# Patient Record
Sex: Female | Born: 1980 | Race: Black or African American | Hispanic: No | Smoking: Light tobacco smoker
Health system: Southern US, Community
[De-identification: ages and names within clinical notes are randomized; demographics above are authoritative.]

## PROBLEM LIST (undated history)

## (undated) DIAGNOSIS — M545 Low back pain, unspecified: Secondary | ICD-10-CM

## (undated) DIAGNOSIS — D649 Anemia, unspecified: Secondary | ICD-10-CM

## (undated) DIAGNOSIS — F329 Major depressive disorder, single episode, unspecified: Secondary | ICD-10-CM

## (undated) DIAGNOSIS — R002 Palpitations: Secondary | ICD-10-CM

## (undated) DIAGNOSIS — J40 Bronchitis, not specified as acute or chronic: Secondary | ICD-10-CM

## (undated) DIAGNOSIS — K859 Acute pancreatitis without necrosis or infection, unspecified: Secondary | ICD-10-CM

## (undated) DIAGNOSIS — G4700 Insomnia, unspecified: Secondary | ICD-10-CM

## (undated) DIAGNOSIS — J302 Other seasonal allergic rhinitis: Secondary | ICD-10-CM

## (undated) DIAGNOSIS — R011 Cardiac murmur, unspecified: Secondary | ICD-10-CM

## (undated) DIAGNOSIS — D573 Sickle-cell trait: Secondary | ICD-10-CM

## (undated) DIAGNOSIS — M1990 Unspecified osteoarthritis, unspecified site: Secondary | ICD-10-CM

## (undated) DIAGNOSIS — N951 Menopausal and female climacteric states: Secondary | ICD-10-CM

## (undated) DIAGNOSIS — N730 Acute parametritis and pelvic cellulitis: Secondary | ICD-10-CM

## (undated) DIAGNOSIS — F4310 Post-traumatic stress disorder, unspecified: Secondary | ICD-10-CM

## (undated) DIAGNOSIS — G8929 Other chronic pain: Secondary | ICD-10-CM

## (undated) DIAGNOSIS — J45909 Unspecified asthma, uncomplicated: Secondary | ICD-10-CM

## (undated) DIAGNOSIS — I10 Essential (primary) hypertension: Secondary | ICD-10-CM

## (undated) DIAGNOSIS — F32A Depression, unspecified: Secondary | ICD-10-CM

## (undated) DIAGNOSIS — G43909 Migraine, unspecified, not intractable, without status migrainosus: Secondary | ICD-10-CM

## (undated) DIAGNOSIS — O139 Gestational [pregnancy-induced] hypertension without significant proteinuria, unspecified trimester: Secondary | ICD-10-CM

## (undated) DIAGNOSIS — Z973 Presence of spectacles and contact lenses: Secondary | ICD-10-CM

## (undated) DIAGNOSIS — K219 Gastro-esophageal reflux disease without esophagitis: Secondary | ICD-10-CM

## (undated) DIAGNOSIS — M5412 Radiculopathy, cervical region: Secondary | ICD-10-CM

## (undated) HISTORY — DX: Essential (primary) hypertension: I10

## (undated) HISTORY — PX: TUBAL LIGATION: SHX77

## (undated) HISTORY — DX: Insomnia, unspecified: G47.00

## (undated) HISTORY — DX: Bronchitis, not specified as acute or chronic: J40

## (undated) HISTORY — DX: Anemia, unspecified: D64.9

## (undated) HISTORY — PX: DILATION AND CURETTAGE OF UTERUS: SHX78

## (undated) HISTORY — PX: OVARY SURGERY: SHX727

---
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
## 2001-01-22 ENCOUNTER — Encounter: Admission: RE | Admit: 2001-01-22 | Discharge: 2001-01-22 | Payer: Self-pay | Admitting: Family Medicine

## 2001-01-23 ENCOUNTER — Emergency Department (HOSPITAL_COMMUNITY): Admission: EM | Admit: 2001-01-23 | Discharge: 2001-01-24 | Payer: Self-pay | Admitting: Emergency Medicine

## 2001-02-05 ENCOUNTER — Inpatient Hospital Stay (HOSPITAL_COMMUNITY): Admission: AD | Admit: 2001-02-05 | Discharge: 2001-02-05 | Payer: Self-pay | Admitting: Obstetrics & Gynecology

## 2001-10-09 ENCOUNTER — Emergency Department (HOSPITAL_COMMUNITY): Admission: EM | Admit: 2001-10-09 | Discharge: 2001-10-10 | Payer: Self-pay | Admitting: Emergency Medicine

## 2001-11-03 ENCOUNTER — Inpatient Hospital Stay: Admission: AD | Admit: 2001-11-03 | Discharge: 2001-11-03 | Payer: Self-pay | Admitting: Obstetrics and Gynecology

## 2001-11-19 ENCOUNTER — Inpatient Hospital Stay (HOSPITAL_COMMUNITY): Admission: AD | Admit: 2001-11-19 | Discharge: 2001-11-19 | Payer: Self-pay | Admitting: Obstetrics and Gynecology

## 2001-12-05 ENCOUNTER — Encounter: Payer: Self-pay | Admitting: Obstetrics and Gynecology

## 2001-12-05 ENCOUNTER — Inpatient Hospital Stay (HOSPITAL_COMMUNITY): Admission: AD | Admit: 2001-12-05 | Discharge: 2001-12-13 | Payer: Self-pay | Admitting: *Deleted

## 2001-12-20 ENCOUNTER — Encounter: Admission: RE | Admit: 2001-12-20 | Discharge: 2001-12-20 | Payer: Self-pay | Admitting: *Deleted

## 2001-12-26 ENCOUNTER — Inpatient Hospital Stay (HOSPITAL_COMMUNITY): Admission: AD | Admit: 2001-12-26 | Discharge: 2001-12-26 | Payer: Self-pay | Admitting: Obstetrics and Gynecology

## 2002-01-03 ENCOUNTER — Encounter: Admission: RE | Admit: 2002-01-03 | Discharge: 2002-01-03 | Payer: Self-pay | Admitting: Obstetrics and Gynecology

## 2002-01-14 ENCOUNTER — Inpatient Hospital Stay (HOSPITAL_COMMUNITY): Admission: AD | Admit: 2002-01-14 | Discharge: 2002-01-14 | Payer: Self-pay | Admitting: *Deleted

## 2002-01-23 ENCOUNTER — Encounter: Admission: RE | Admit: 2002-01-23 | Discharge: 2002-01-23 | Payer: Self-pay | Admitting: *Deleted

## 2002-01-28 ENCOUNTER — Inpatient Hospital Stay (HOSPITAL_COMMUNITY): Admission: AD | Admit: 2002-01-28 | Discharge: 2002-01-30 | Payer: Self-pay | Admitting: Obstetrics and Gynecology

## 2002-03-29 ENCOUNTER — Encounter: Admission: RE | Admit: 2002-03-29 | Discharge: 2002-03-29 | Payer: Self-pay | Admitting: Family Medicine

## 2002-04-29 ENCOUNTER — Encounter: Admission: RE | Admit: 2002-04-29 | Discharge: 2002-04-29 | Payer: Self-pay | Admitting: Family Medicine

## 2002-05-07 ENCOUNTER — Encounter: Admission: RE | Admit: 2002-05-07 | Discharge: 2002-05-07 | Payer: Self-pay | Admitting: Family Medicine

## 2002-06-03 ENCOUNTER — Ambulatory Visit (HOSPITAL_COMMUNITY): Admission: RE | Admit: 2002-06-03 | Discharge: 2002-06-03 | Payer: Self-pay | Admitting: Neurology

## 2002-06-14 ENCOUNTER — Other Ambulatory Visit: Admission: RE | Admit: 2002-06-14 | Discharge: 2002-06-14 | Payer: Self-pay | Admitting: Internal Medicine

## 2002-06-14 ENCOUNTER — Encounter: Admission: RE | Admit: 2002-06-14 | Discharge: 2002-06-14 | Payer: Self-pay | Admitting: Family Medicine

## 2002-06-14 ENCOUNTER — Encounter (INDEPENDENT_AMBULATORY_CARE_PROVIDER_SITE_OTHER): Payer: Self-pay | Admitting: Specialist

## 2002-06-18 ENCOUNTER — Encounter: Payer: Self-pay | Admitting: Sports Medicine

## 2002-06-18 ENCOUNTER — Encounter: Admission: RE | Admit: 2002-06-18 | Discharge: 2002-06-18 | Payer: Self-pay | Admitting: Sports Medicine

## 2002-06-20 ENCOUNTER — Encounter (INDEPENDENT_AMBULATORY_CARE_PROVIDER_SITE_OTHER): Payer: Self-pay | Admitting: *Deleted

## 2002-06-20 HISTORY — PX: OVARY SURGERY: SHX727

## 2002-06-21 ENCOUNTER — Encounter: Admission: RE | Admit: 2002-06-21 | Discharge: 2002-06-21 | Payer: Self-pay | Admitting: Family Medicine

## 2002-07-03 ENCOUNTER — Encounter: Payer: Self-pay | Admitting: Internal Medicine

## 2002-07-03 ENCOUNTER — Encounter: Admission: RE | Admit: 2002-07-03 | Discharge: 2002-07-03 | Payer: Self-pay | Admitting: Internal Medicine

## 2002-07-05 ENCOUNTER — Emergency Department (HOSPITAL_COMMUNITY): Admission: EM | Admit: 2002-07-05 | Discharge: 2002-07-05 | Payer: Self-pay | Admitting: Emergency Medicine

## 2002-07-11 ENCOUNTER — Encounter: Admission: RE | Admit: 2002-07-11 | Discharge: 2002-07-11 | Payer: Self-pay | Admitting: Family Medicine

## 2002-07-18 ENCOUNTER — Encounter: Admission: RE | Admit: 2002-07-18 | Discharge: 2002-07-18 | Payer: Self-pay | Admitting: Family Medicine

## 2002-07-19 ENCOUNTER — Encounter: Admission: RE | Admit: 2002-07-19 | Discharge: 2002-07-19 | Payer: Self-pay | Admitting: *Deleted

## 2002-07-19 ENCOUNTER — Other Ambulatory Visit: Admission: RE | Admit: 2002-07-19 | Discharge: 2002-07-19 | Payer: Self-pay | Admitting: *Deleted

## 2002-07-19 ENCOUNTER — Encounter (INDEPENDENT_AMBULATORY_CARE_PROVIDER_SITE_OTHER): Payer: Self-pay

## 2002-08-02 ENCOUNTER — Encounter: Admission: RE | Admit: 2002-08-02 | Discharge: 2002-08-02 | Payer: Self-pay | Admitting: *Deleted

## 2002-08-12 ENCOUNTER — Inpatient Hospital Stay (HOSPITAL_COMMUNITY): Admission: AD | Admit: 2002-08-12 | Discharge: 2002-08-12 | Payer: Self-pay | Admitting: Obstetrics and Gynecology

## 2002-08-13 ENCOUNTER — Inpatient Hospital Stay (HOSPITAL_COMMUNITY): Admission: AD | Admit: 2002-08-13 | Discharge: 2002-08-13 | Payer: Self-pay | Admitting: *Deleted

## 2002-08-13 ENCOUNTER — Encounter: Payer: Self-pay | Admitting: *Deleted

## 2002-08-14 ENCOUNTER — Inpatient Hospital Stay (HOSPITAL_COMMUNITY): Admission: RE | Admit: 2002-08-14 | Discharge: 2002-08-16 | Payer: Self-pay | Admitting: Obstetrics and Gynecology

## 2002-08-14 ENCOUNTER — Encounter (INDEPENDENT_AMBULATORY_CARE_PROVIDER_SITE_OTHER): Payer: Self-pay

## 2002-08-17 ENCOUNTER — Inpatient Hospital Stay (HOSPITAL_COMMUNITY): Admission: AD | Admit: 2002-08-17 | Discharge: 2002-08-17 | Payer: Self-pay | Admitting: *Deleted

## 2002-08-23 ENCOUNTER — Encounter: Admission: RE | Admit: 2002-08-23 | Discharge: 2002-08-23 | Payer: Self-pay | Admitting: *Deleted

## 2003-10-18 ENCOUNTER — Emergency Department (HOSPITAL_COMMUNITY): Admission: EM | Admit: 2003-10-18 | Discharge: 2003-10-18 | Payer: Self-pay | Admitting: Emergency Medicine

## 2004-01-21 ENCOUNTER — Encounter: Admission: RE | Admit: 2004-01-21 | Discharge: 2004-01-21 | Payer: Self-pay | Admitting: Sports Medicine

## 2004-02-18 ENCOUNTER — Other Ambulatory Visit: Admission: RE | Admit: 2004-02-18 | Discharge: 2004-02-18 | Payer: Self-pay | Admitting: Family Medicine

## 2004-02-18 ENCOUNTER — Encounter: Admission: RE | Admit: 2004-02-18 | Discharge: 2004-02-18 | Payer: Self-pay | Admitting: Family Medicine

## 2004-02-26 ENCOUNTER — Ambulatory Visit: Payer: Self-pay | Admitting: Family Medicine

## 2004-03-12 ENCOUNTER — Ambulatory Visit: Payer: Self-pay | Admitting: Family Medicine

## 2004-03-17 ENCOUNTER — Encounter: Admission: RE | Admit: 2004-03-17 | Discharge: 2004-03-17 | Payer: Self-pay | Admitting: Sports Medicine

## 2004-03-17 IMAGING — CR DG HAND COMPLETE 3+V*L*
3 series · 3 of 3 positions shown · non-contrast
Comparison: none

CLINICAL DATA: Bilateral hand pain and swelling, felt to possibly be rheumatoid arthritis.
 RIGHT HAND, COMPLETE
 Three views of the right hand are made without previous films for comparison and show no definite bony or joint abnormality.  Soft tissues show what appears to be  mild soft tissue swelling associated with the proximal phalanx of the right index finger.  
 IMPRESSION
 No definite bony or joint abnormality.  Mild soft tissue swelling, proximal phalanx area, right index finger.
 LEFT HAND, COMPLETE
 Three views of the left hand show no bony or joint abnormality.  Soft tissues appear to be within normal limits.
 Normal left hand.

[view not recorded (1 of 3)]
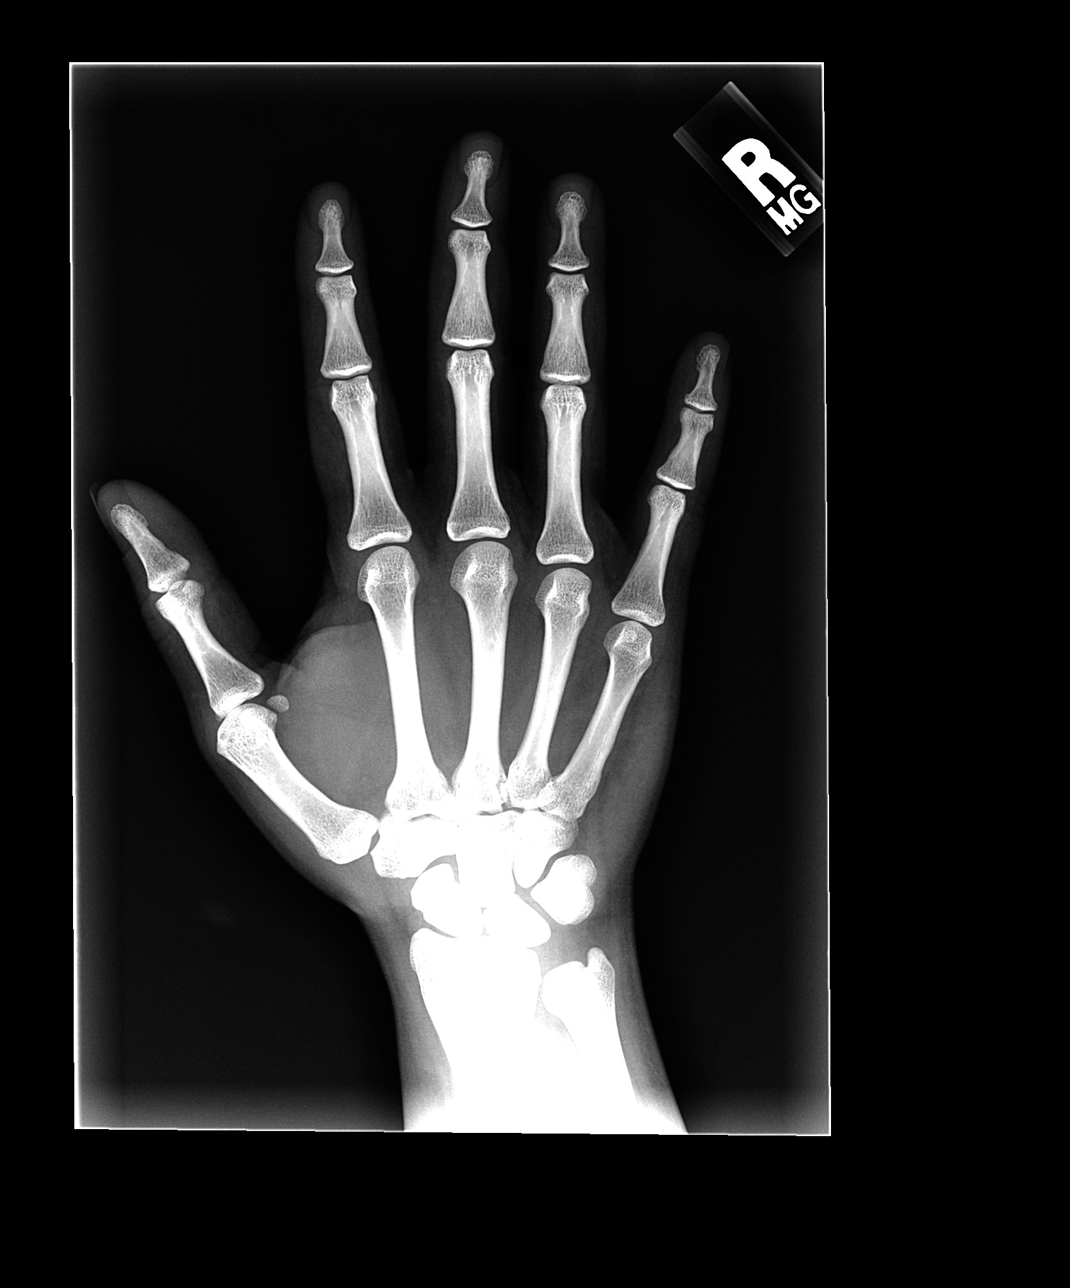

[view not recorded (2 of 3)]
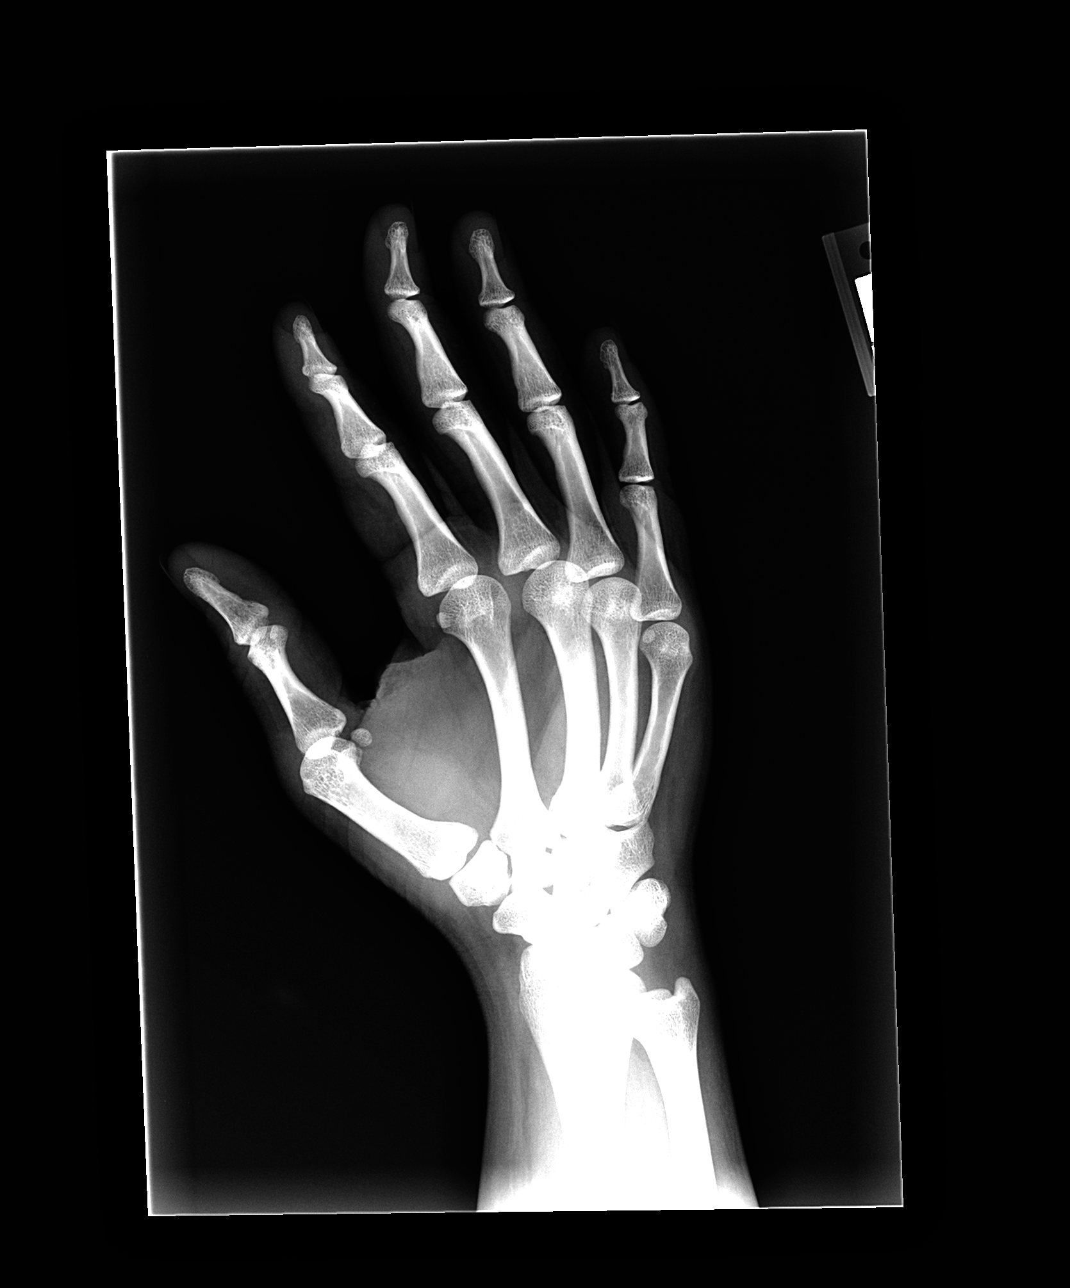

[view not recorded (3 of 3)]
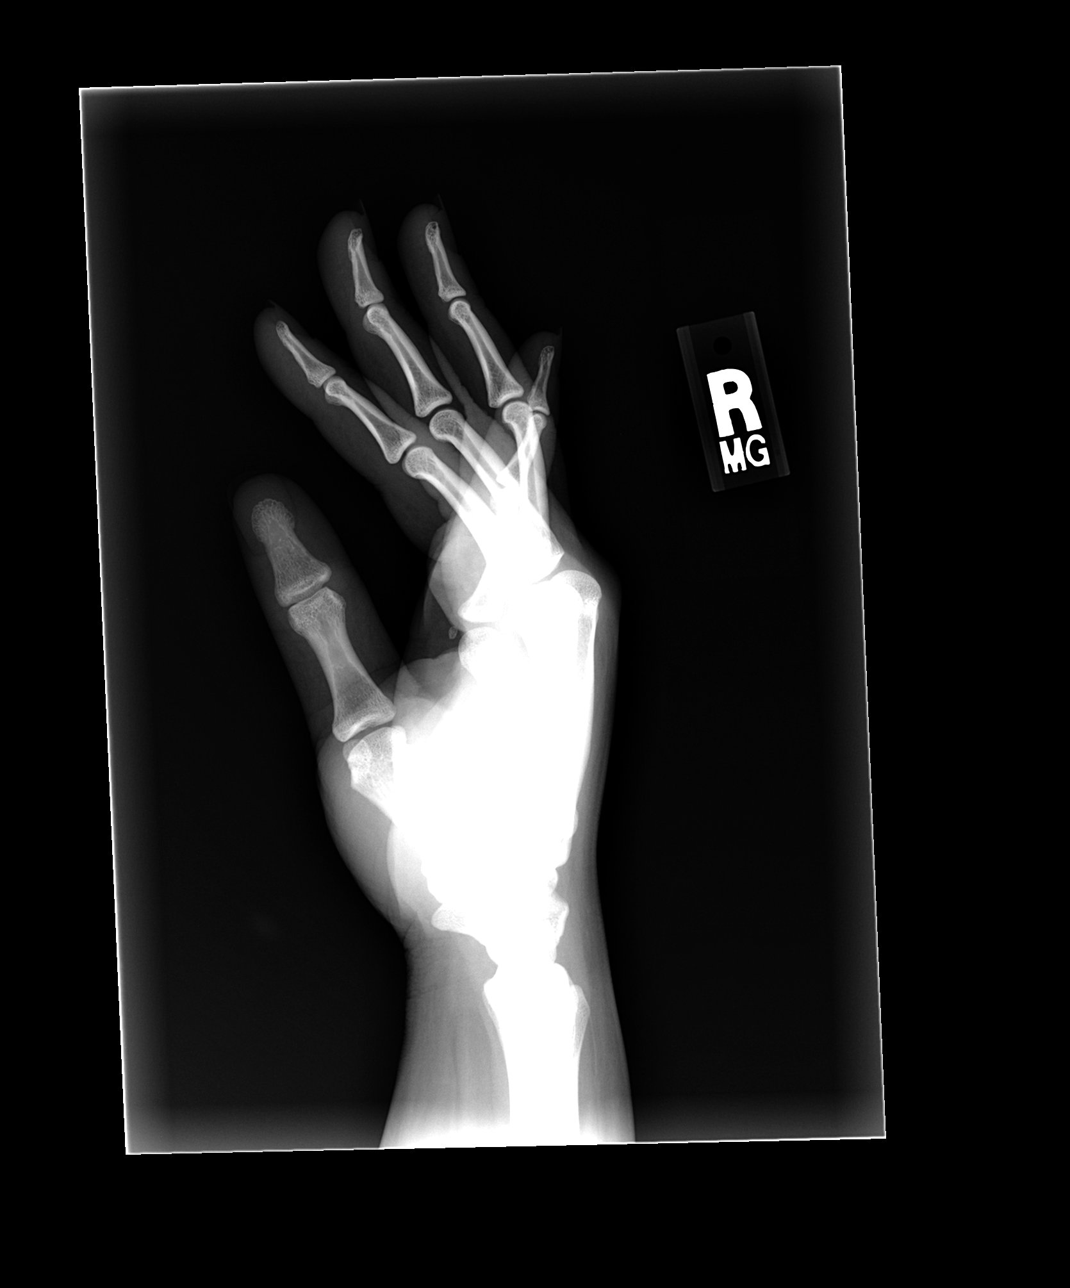

[3 of 3 positions shown; findings below may reference images not displayed]

## 2004-03-17 IMAGING — CR DG HAND COMPLETE 3+V*L*
3 series · 3 of 3 positions shown · non-contrast
Comparison: none

CLINICAL DATA: Bilateral hand pain and swelling, felt to possibly be rheumatoid arthritis.
 RIGHT HAND, COMPLETE
 Three views of the right hand are made without previous films for comparison and show no definite bony or joint abnormality.  Soft tissues show what appears to be  mild soft tissue swelling associated with the proximal phalanx of the right index finger.  
 IMPRESSION
 No definite bony or joint abnormality.  Mild soft tissue swelling, proximal phalanx area, right index finger.
 LEFT HAND, COMPLETE
 Three views of the left hand show no bony or joint abnormality.  Soft tissues appear to be within normal limits.
 Normal left hand.

[view not recorded (1 of 3)]
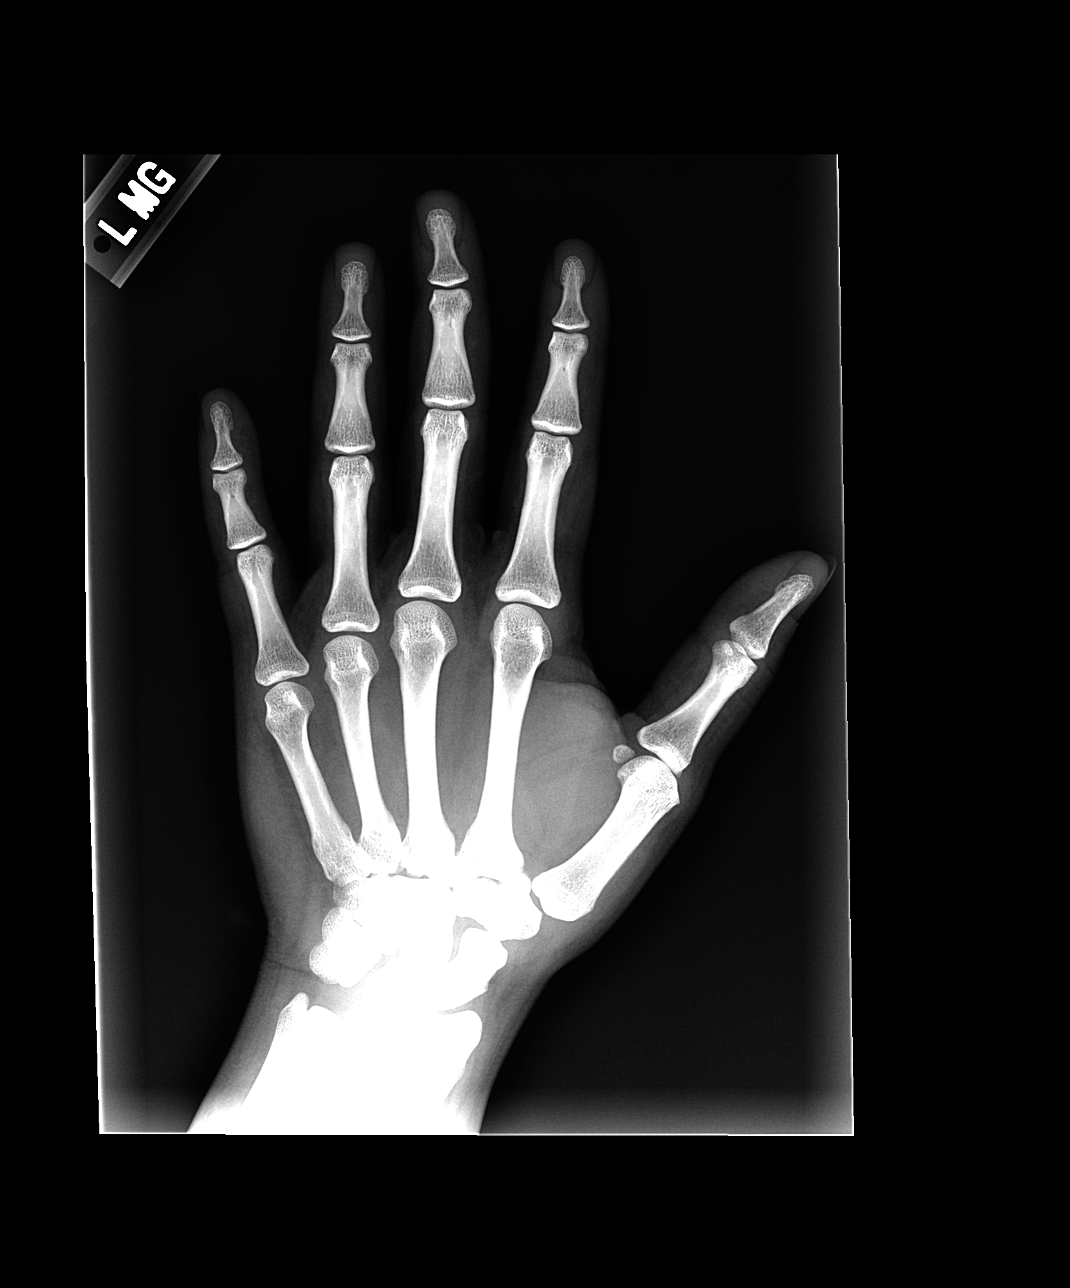

[view not recorded (2 of 3)]
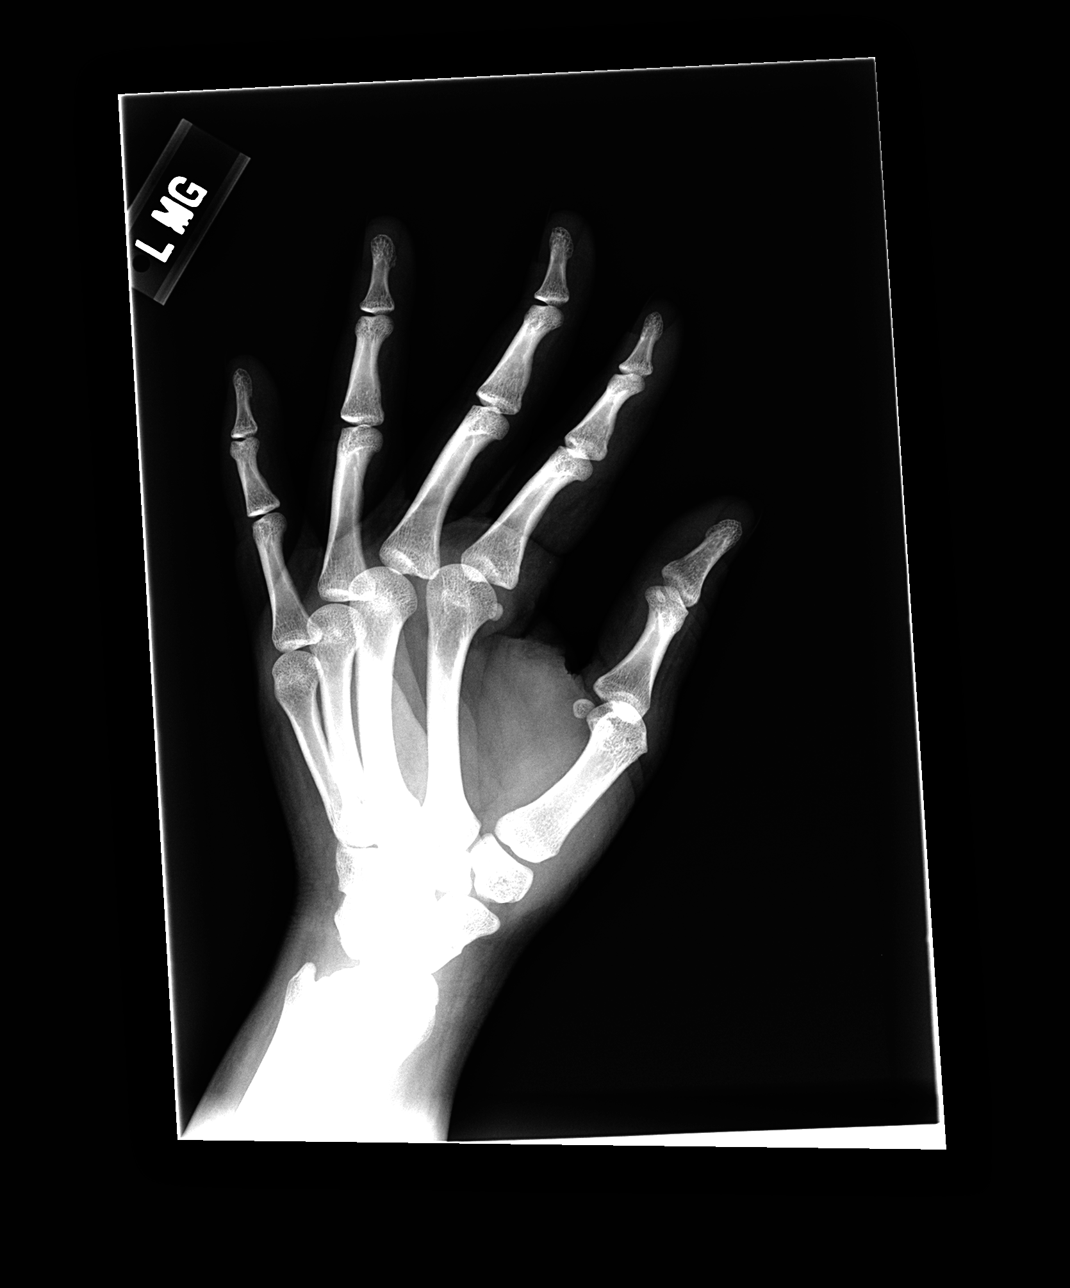

[view not recorded (3 of 3)]
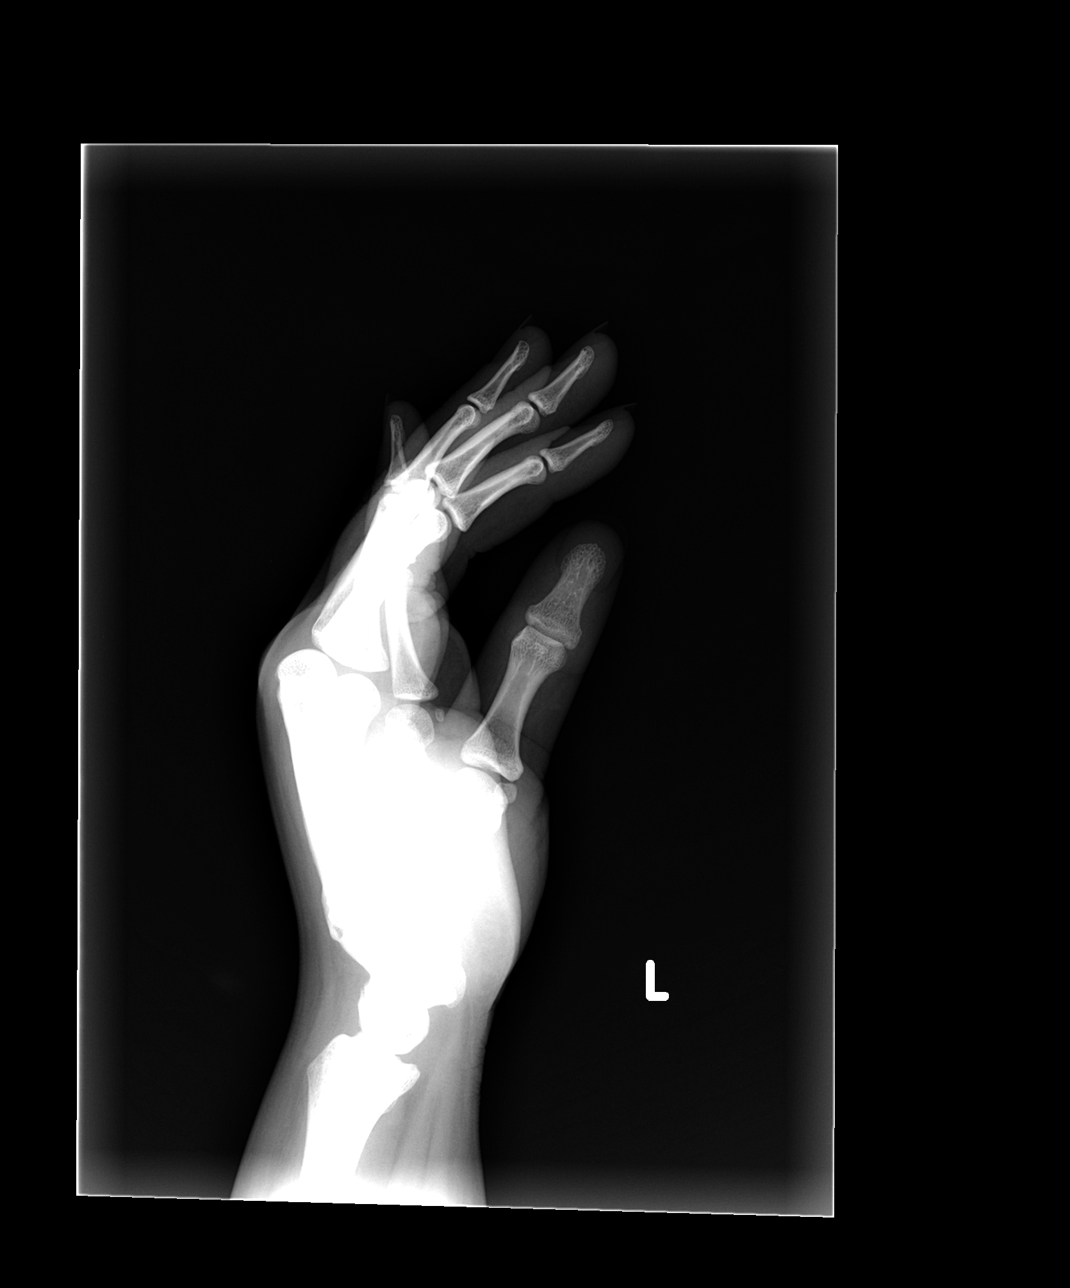

[3 of 3 positions shown; findings below may reference images not displayed]

## 2004-07-19 ENCOUNTER — Emergency Department (HOSPITAL_COMMUNITY): Admission: EM | Admit: 2004-07-19 | Discharge: 2004-07-19 | Payer: Self-pay | Admitting: Emergency Medicine

## 2004-07-23 ENCOUNTER — Ambulatory Visit: Payer: Self-pay | Admitting: Family Medicine

## 2004-07-26 ENCOUNTER — Encounter: Admission: RE | Admit: 2004-07-26 | Discharge: 2004-07-26 | Payer: Self-pay | Admitting: Sports Medicine

## 2004-07-26 IMAGING — US US TRANSVAGINAL NON-OB
1 series · 14 of 25 positions shown · non-contrast
Comparison: none

CLINICAL DATA: Pelvic pain.
 TRANSABDOMINAL AND TRANSVAGINAL PELVIC ULTRASOUND:
 Transabdominal and transvaginal ultrasound of the pelvis were performed.  The uterus is normal in size measuring 9.9 cm sagittally with a depth of 4.9 cm and width of 5.7 cm.   The endometrium is normal measuring 4 mm in thickness.  The ovaries are within the upper limits of normal in size with the right ovary measuring 4.6 x 3.1 x 2.5 cm.  There are multiple cysts in the right ovary, and with some of them being in the periphery, polycystic ovary disease would be difficult to exclude.   The left ovary measures 3.5 x 1.6 x 1.6 cm with small follicles present.  Only a small amount of free fluid is noted.

[Series 1: unknown · 0.26mm/px · 14 of 67 slices shown]
[im 1/67]
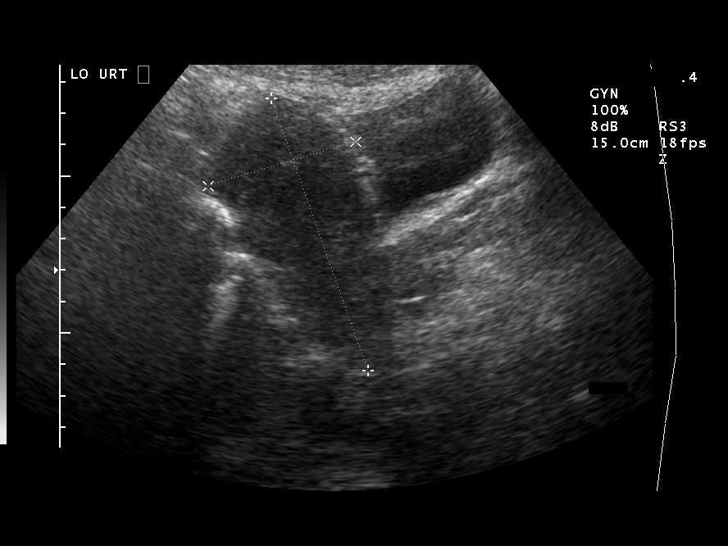
[im 6/67]
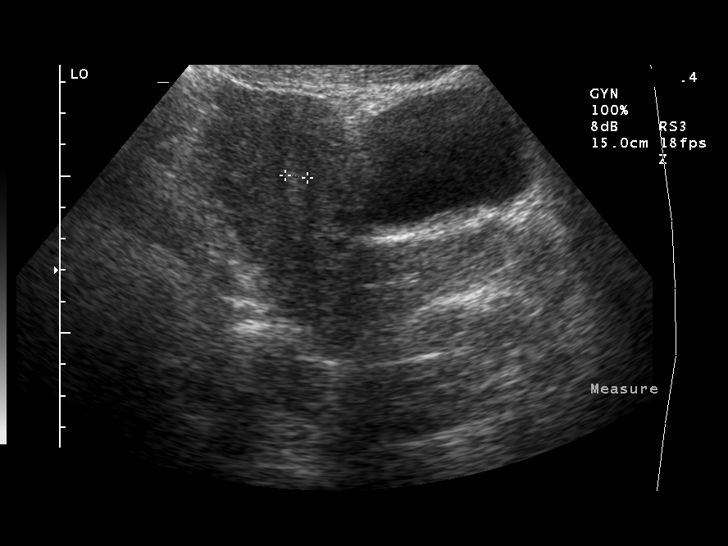
[im 12/67]
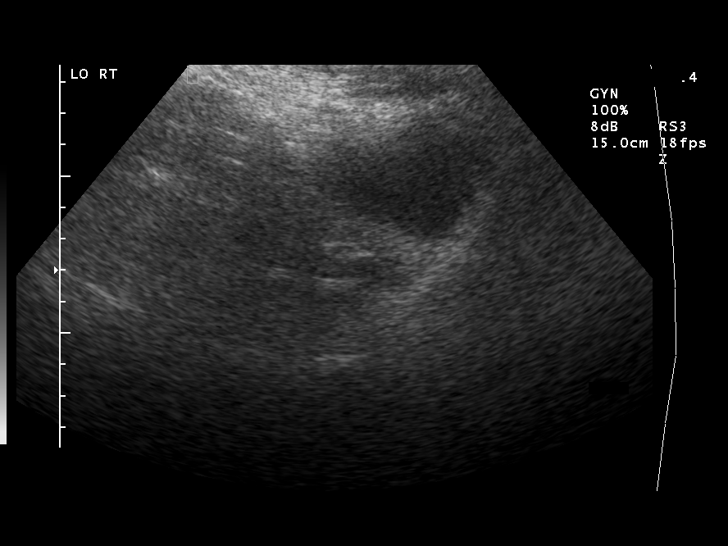
[im 17/67]
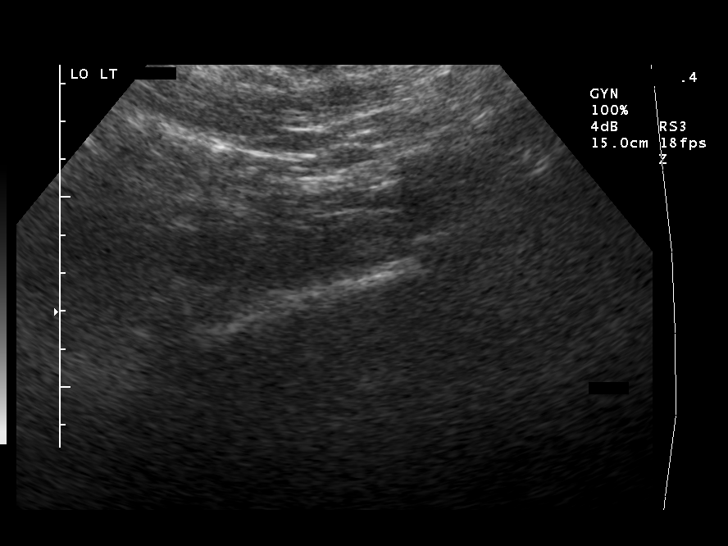
[im 23/67]
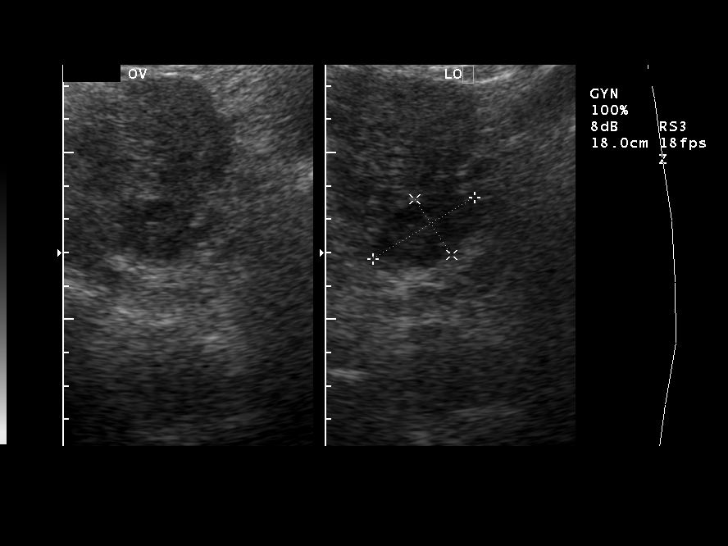
[im 25/67]
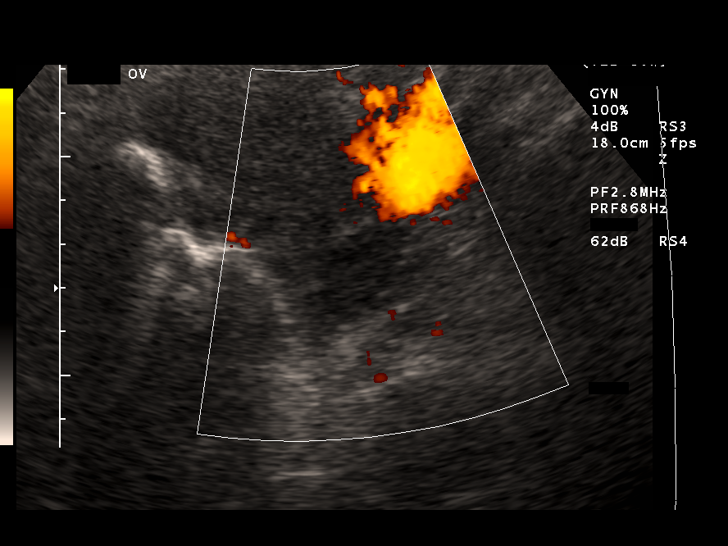
[im 31/67]
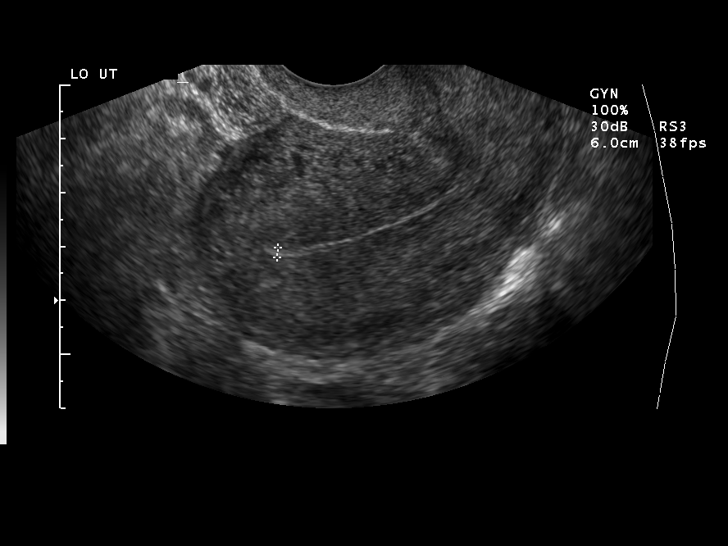
[im 36/67]
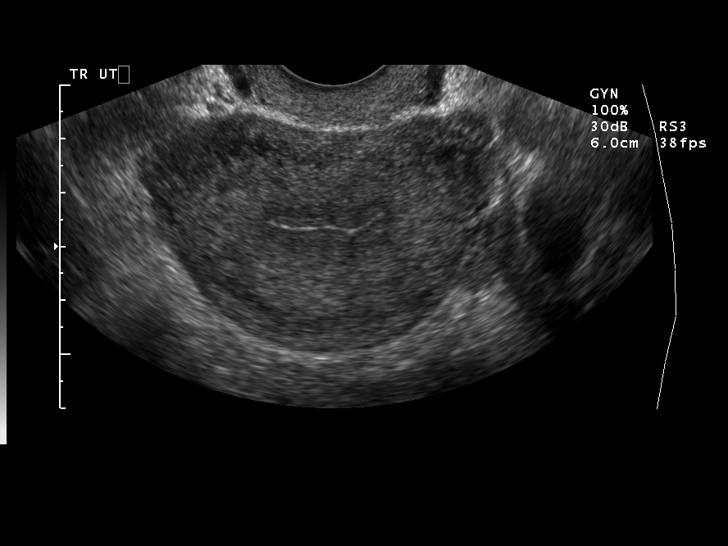
[im 42/67]
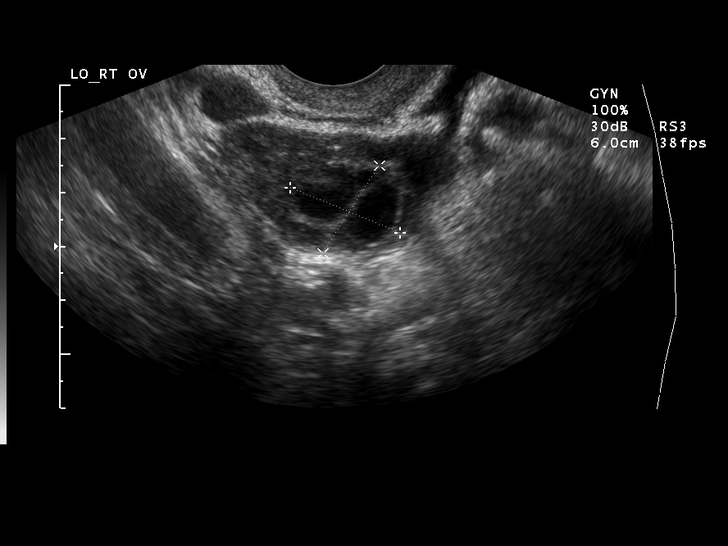
[im 45/67]
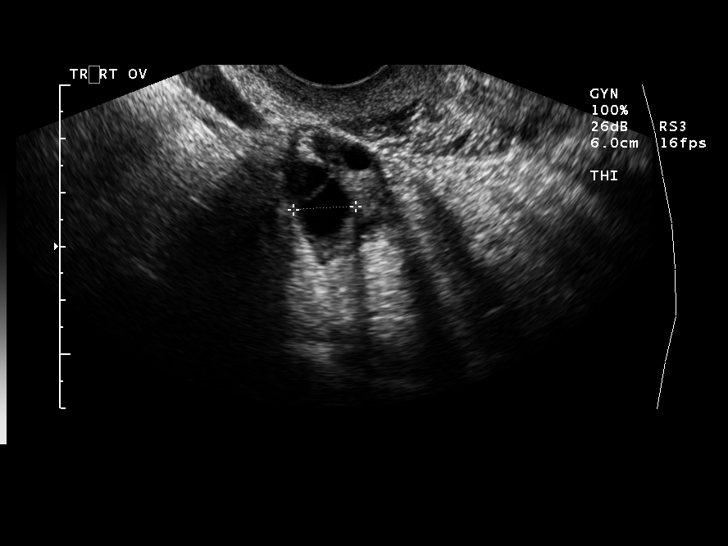
[im 50/67]
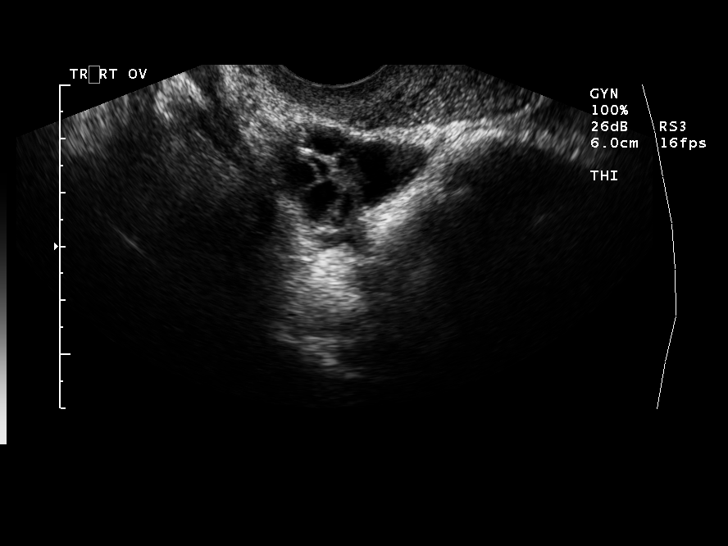
[im 56/67]
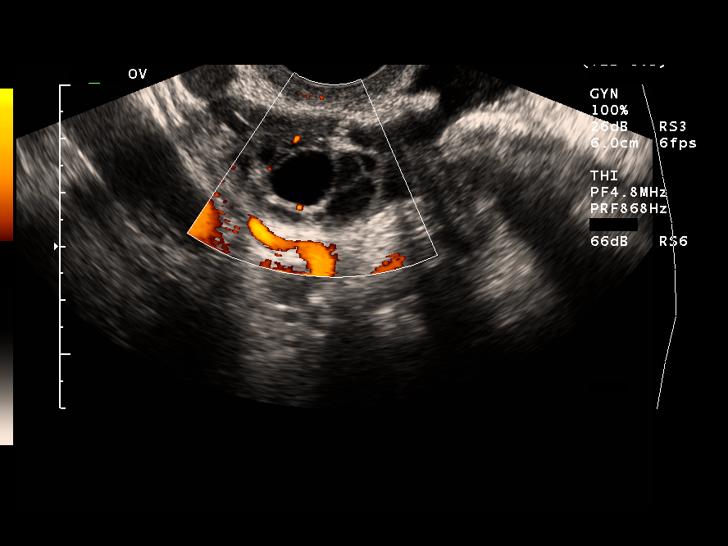
[im 61/67]
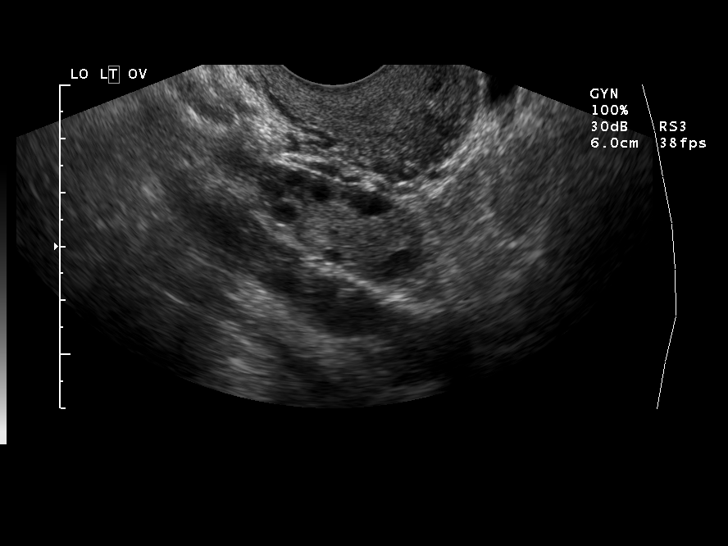
[im 67/67]
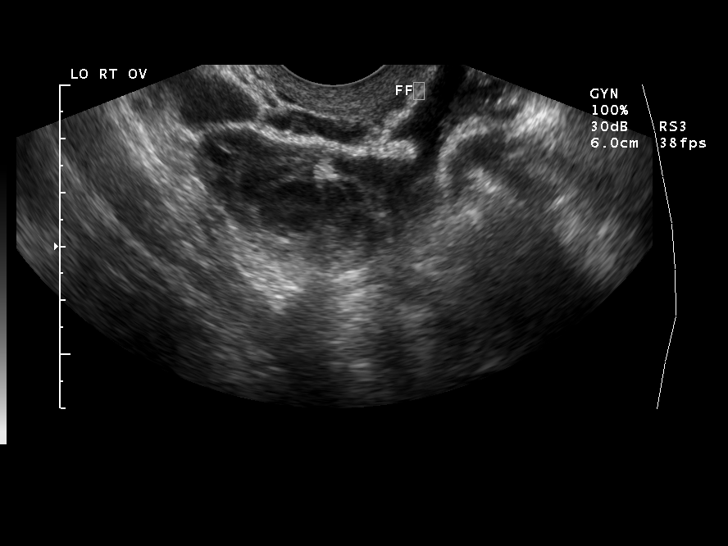

[14 of 25 positions shown; findings below may reference images not displayed]

IMPRESSION: Multiple cysts in the right ovary, some of which are very peripheral.  Cannot exclude polycystic ovary disease.  No abnormality of the uterus is seen.

## 2004-10-03 ENCOUNTER — Emergency Department (HOSPITAL_COMMUNITY): Admission: EM | Admit: 2004-10-03 | Discharge: 2004-10-03 | Payer: Self-pay | Admitting: Emergency Medicine

## 2004-11-01 ENCOUNTER — Emergency Department (HOSPITAL_COMMUNITY): Admission: EM | Admit: 2004-11-01 | Discharge: 2004-11-01 | Payer: Self-pay | Admitting: Emergency Medicine

## 2004-11-16 ENCOUNTER — Emergency Department (HOSPITAL_COMMUNITY): Admission: EM | Admit: 2004-11-16 | Discharge: 2004-11-16 | Payer: Self-pay | Admitting: Emergency Medicine

## 2004-11-29 ENCOUNTER — Emergency Department (HOSPITAL_COMMUNITY): Admission: EM | Admit: 2004-11-29 | Discharge: 2004-11-29 | Payer: Self-pay | Admitting: Emergency Medicine

## 2004-12-28 ENCOUNTER — Emergency Department (HOSPITAL_COMMUNITY): Admission: EM | Admit: 2004-12-28 | Discharge: 2004-12-28 | Payer: Self-pay | Admitting: Emergency Medicine

## 2005-01-04 ENCOUNTER — Ambulatory Visit: Payer: Self-pay | Admitting: Obstetrics & Gynecology

## 2005-01-05 ENCOUNTER — Ambulatory Visit (HOSPITAL_COMMUNITY): Admission: RE | Admit: 2005-01-05 | Discharge: 2005-01-05 | Payer: Self-pay | Admitting: *Deleted

## 2005-01-05 IMAGING — US US TRANSVAGINAL NON-OB
1 series · 18 of 25 positions shown · non-contrast
Comparison: [DATE]
 The uterus is normal in size and appearance.

CLINICAL DATA: Pelvic pain.  Previous history of ovarian cyst.   
 TRANSABDOMINAL AND TRANSVAGINAL PELVIC ULTRASOUND:
TECHNIQUE: Both transabdominal and transvaginal ultrasound examinations of the pelvis were performed including evaluation of the uterus, ovaries, adnexal regions, and pelvic cul-de-sac.

[Series 1: us transvaginal non-ob · 18 of 61 slices shown]
[im 1/61]
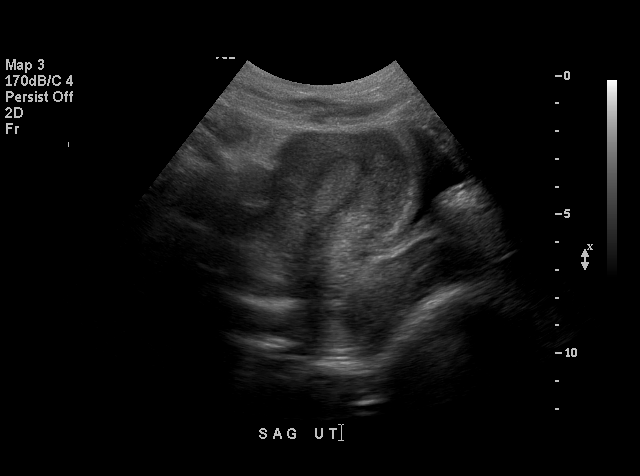
[im 6/61]
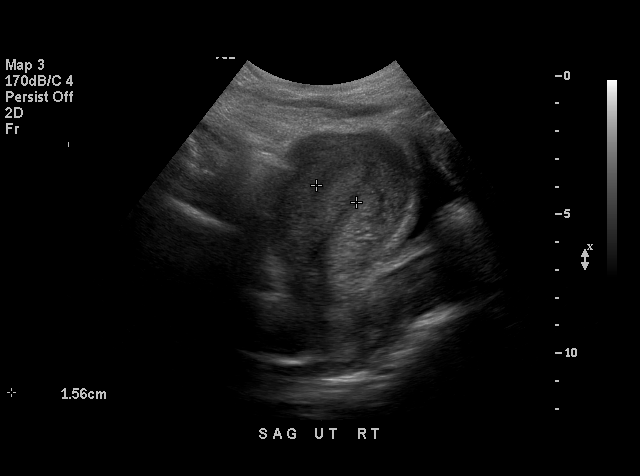
[im 8/61]
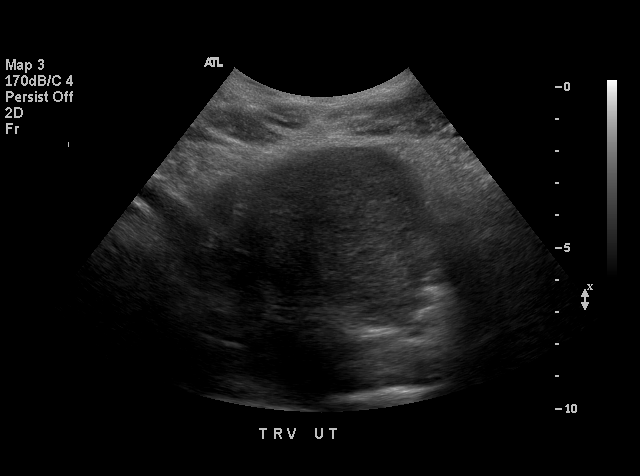
[im 11/61]
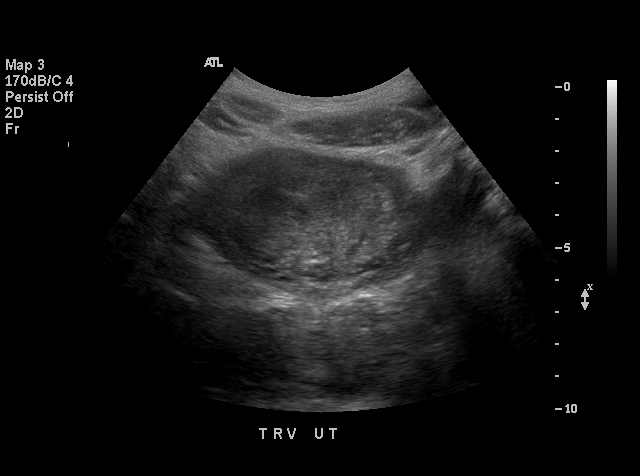
[im 16/61]
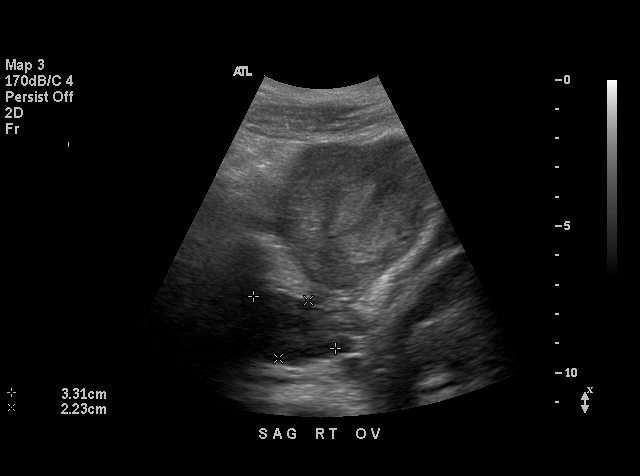
[im 18/61]
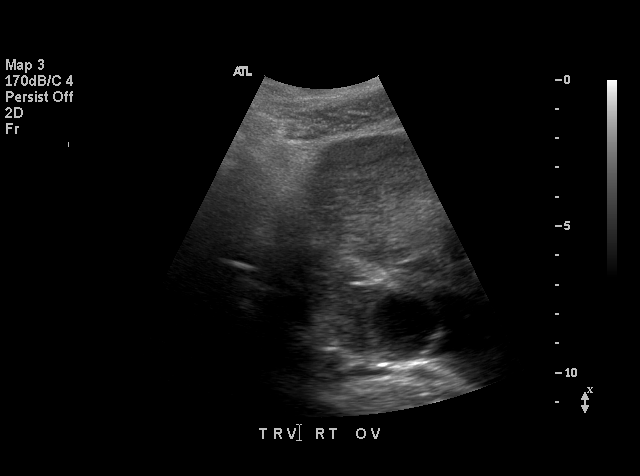
[im 23/61]
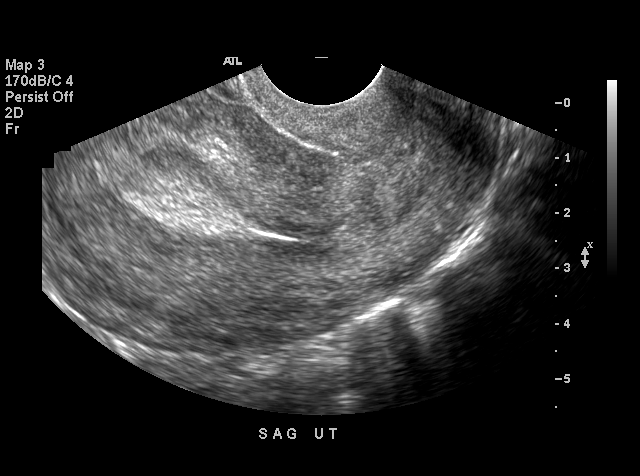
[im 26/61]
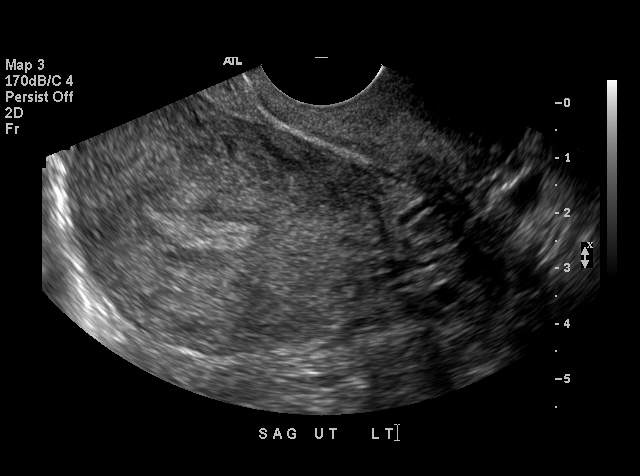
[im 28/61]
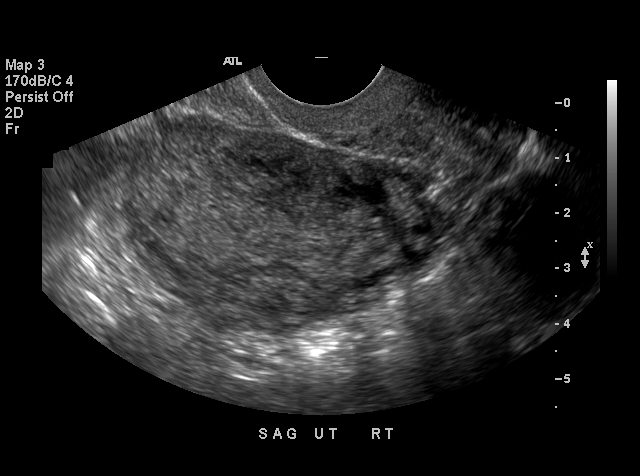
[im 33/61]
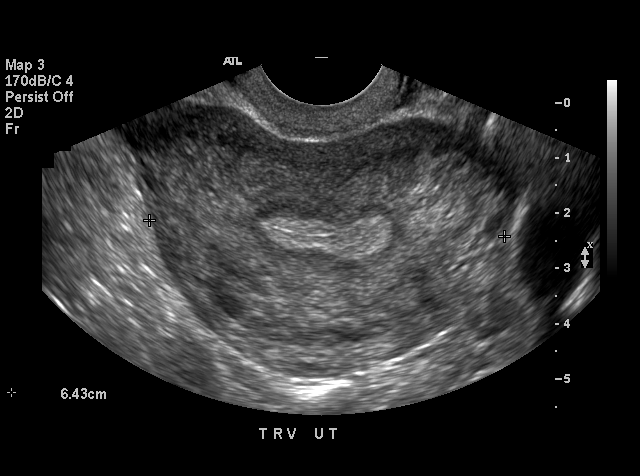
[im 36/61]
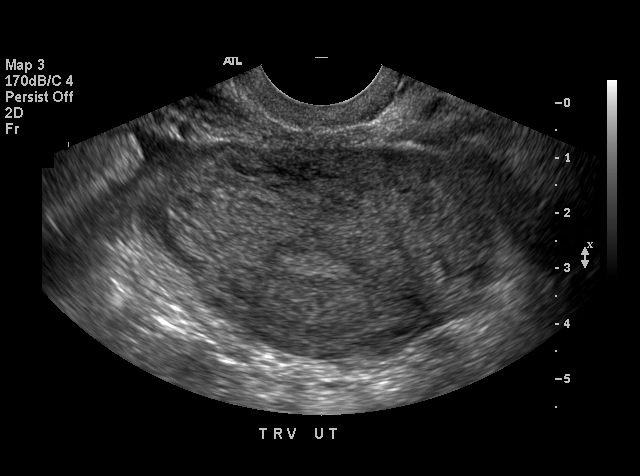
[im 38/61]
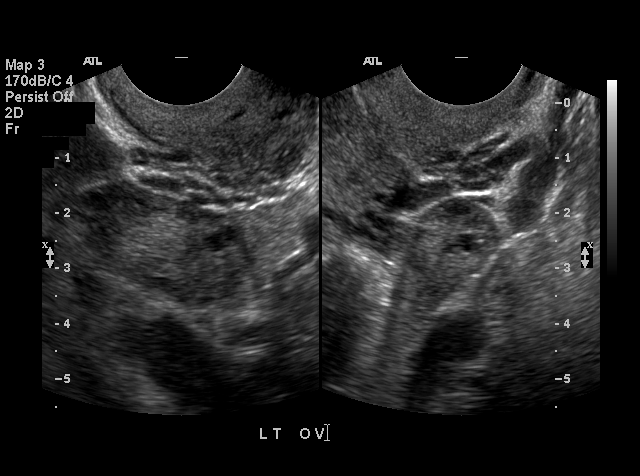
[im 43/61]
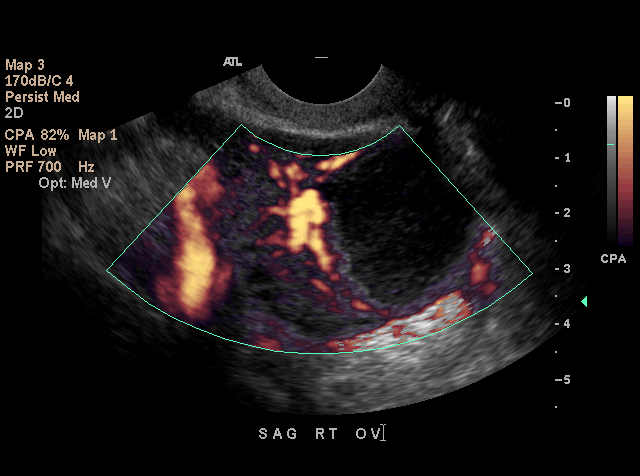
[im 46/61]
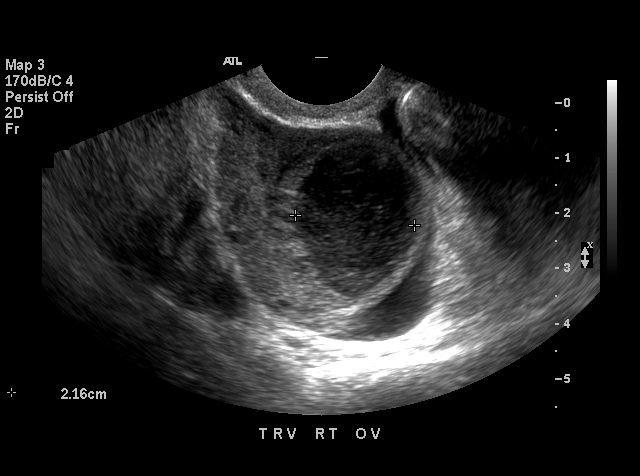
[im 51/61]
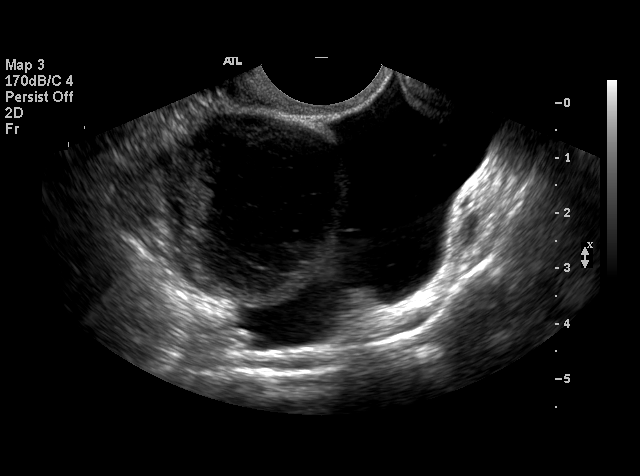
[im 53/61]
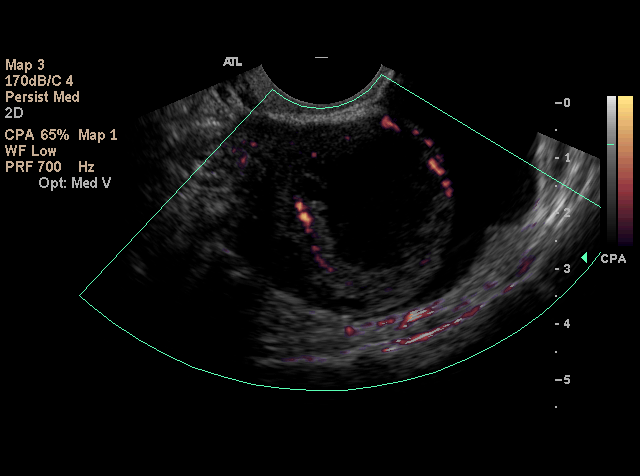
[im 56/61]
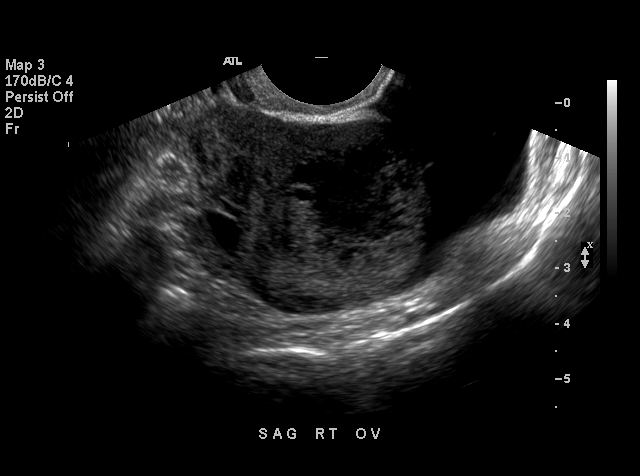
[im 61/61]
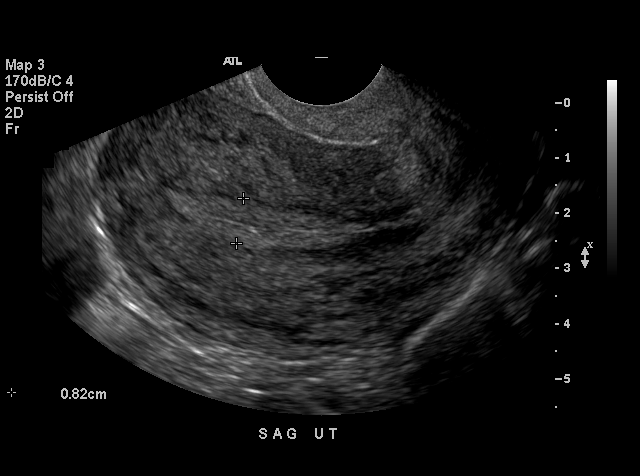

[18 of 25 positions shown; findings below may reference images not displayed]

The uterus is normal in contour and no masses are seen on transabdominal sonography.   Maximum double layer endometrial thickness measures 9 mm on transvaginal sonography.  No fibroids or other uterine abnormalities are identified.
 The right ovary contains a hemorrhagic cyst which shows DIDIERJEAN internal septations and no internal blood flow.   This measures 2.2 x 2.6 x 2.2 cm and is new compared with previous study.   A small amount of free fluid is seen in the right adnexa and pelvic cul-de-sac.   The left ovary is normal in appearance.
IMPRESSION: 1.  2.6 cm hemorrhagic right ovarian cyst, and small amount of free fluid.
 2.  Normal appearance of the uterus and left ovary.

## 2005-01-20 ENCOUNTER — Emergency Department (HOSPITAL_COMMUNITY): Admission: EM | Admit: 2005-01-20 | Discharge: 2005-01-20 | Payer: Self-pay | Admitting: Emergency Medicine

## 2005-01-22 ENCOUNTER — Emergency Department (HOSPITAL_COMMUNITY): Admission: EM | Admit: 2005-01-22 | Discharge: 2005-01-22 | Payer: Self-pay | Admitting: Emergency Medicine

## 2005-01-25 ENCOUNTER — Ambulatory Visit: Payer: Self-pay | Admitting: Obstetrics & Gynecology

## 2005-01-28 ENCOUNTER — Ambulatory Visit: Payer: Self-pay | Admitting: Family Medicine

## 2005-02-08 ENCOUNTER — Ambulatory Visit: Payer: Self-pay | Admitting: *Deleted

## 2005-03-18 ENCOUNTER — Emergency Department (HOSPITAL_COMMUNITY): Admission: EM | Admit: 2005-03-18 | Discharge: 2005-03-18 | Payer: Self-pay | Admitting: Family Medicine

## 2005-05-03 ENCOUNTER — Ambulatory Visit: Payer: Self-pay | Admitting: *Deleted

## 2005-05-10 ENCOUNTER — Emergency Department (HOSPITAL_COMMUNITY): Admission: EM | Admit: 2005-05-10 | Discharge: 2005-05-10 | Payer: Self-pay | Admitting: Emergency Medicine

## 2005-06-01 ENCOUNTER — Emergency Department (HOSPITAL_COMMUNITY): Admission: EM | Admit: 2005-06-01 | Discharge: 2005-06-01 | Payer: Self-pay | Admitting: Family Medicine

## 2005-06-06 ENCOUNTER — Emergency Department (HOSPITAL_COMMUNITY): Admission: EM | Admit: 2005-06-06 | Discharge: 2005-06-06 | Payer: Self-pay | Admitting: Emergency Medicine

## 2005-07-19 ENCOUNTER — Ambulatory Visit: Payer: Self-pay | Admitting: *Deleted

## 2005-10-18 ENCOUNTER — Ambulatory Visit: Payer: Self-pay | Admitting: Obstetrics and Gynecology

## 2006-05-19 ENCOUNTER — Emergency Department (HOSPITAL_COMMUNITY): Admission: EM | Admit: 2006-05-19 | Discharge: 2006-05-19 | Payer: Self-pay | Admitting: Emergency Medicine

## 2006-07-30 ENCOUNTER — Emergency Department (HOSPITAL_COMMUNITY): Admission: EM | Admit: 2006-07-30 | Discharge: 2006-07-31 | Payer: Self-pay | Admitting: Emergency Medicine

## 2006-07-31 IMAGING — US US OB TRANSVAGINAL MODIFY
1 series · 14 of 28 positions shown · non-contrast
Comparison: none

CLINICAL DATA: Abdominal pain and cramping in a pregnant female.  
 OBSTETRICAL ULTRASOUND <14 WKS AND TRANSVAGINAL OB US:
TECHNIQUE: Both transabdominal and transvaginal ultrasound examinations were performed for complete evaluation of the gestation as well as the maternal uterus, adnexal regions, and pelvic cul-de-sac.

[Series 1: unknown · 0.32mm/px · 14 of 43 slices shown]
[im 2/43]
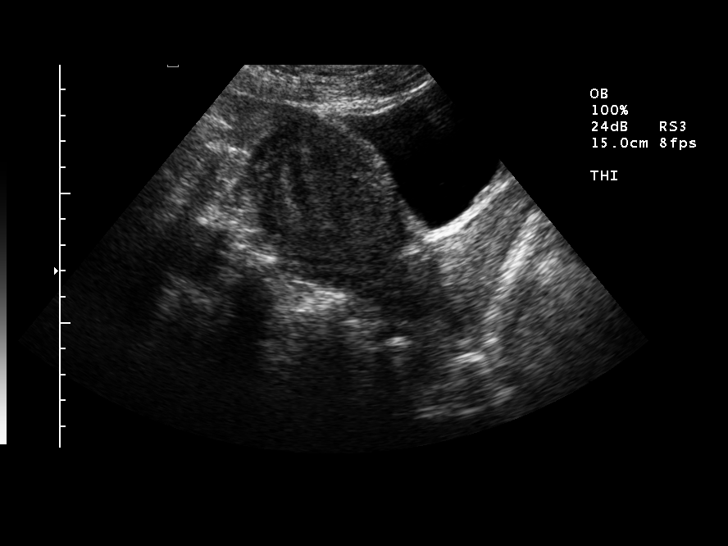
[im 5/43]
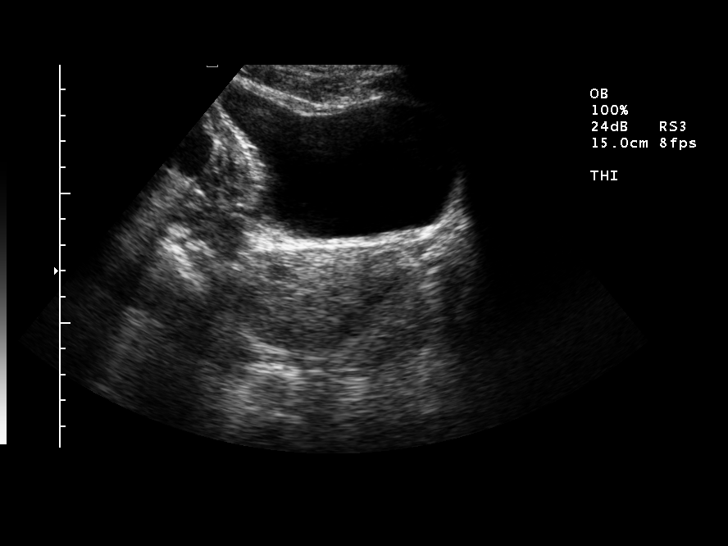
[im 8/43]
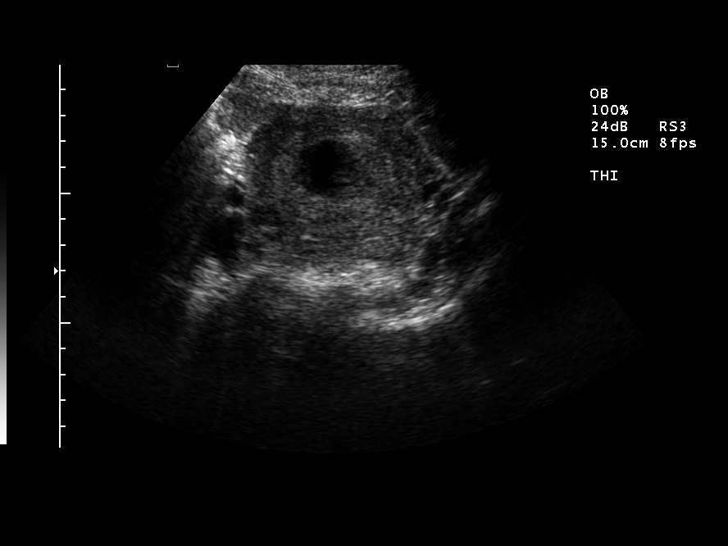
[im 11/43]
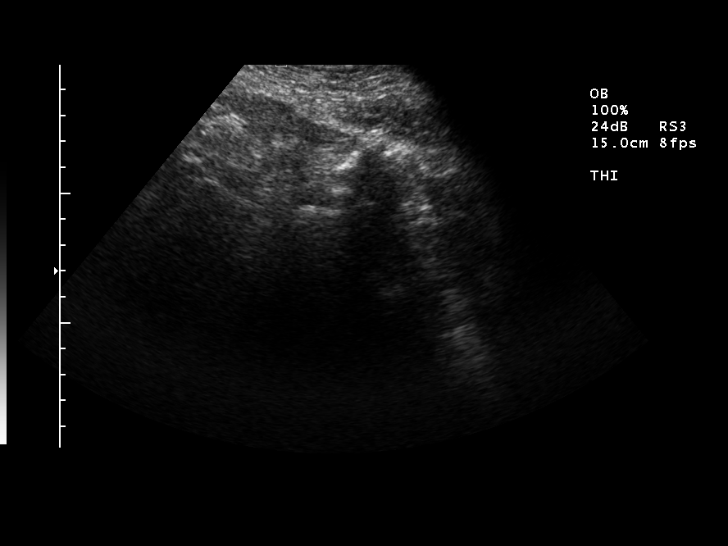
[im 15/43]
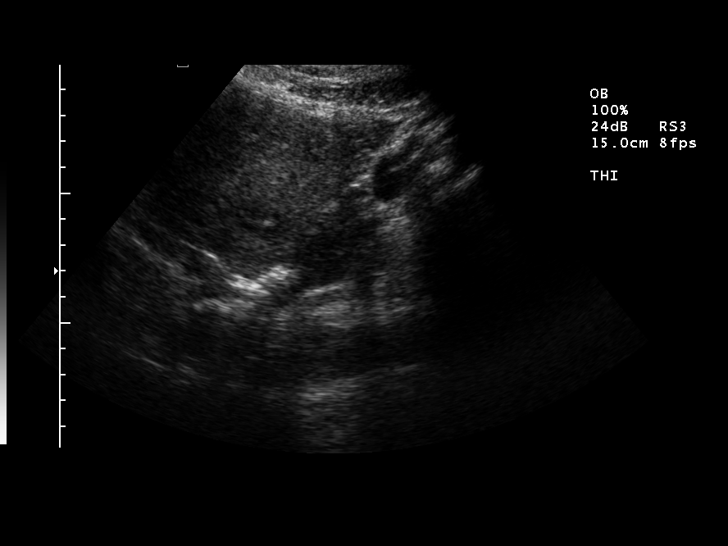
[im 18/43]
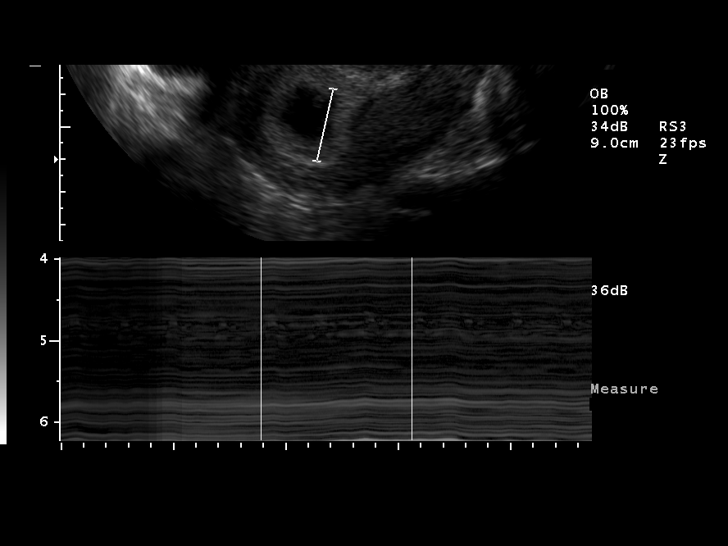
[im 21/43]
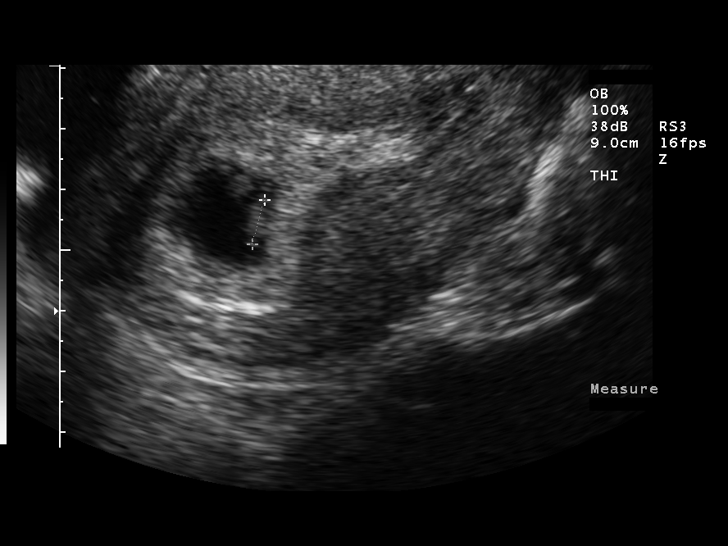
[im 24/43]
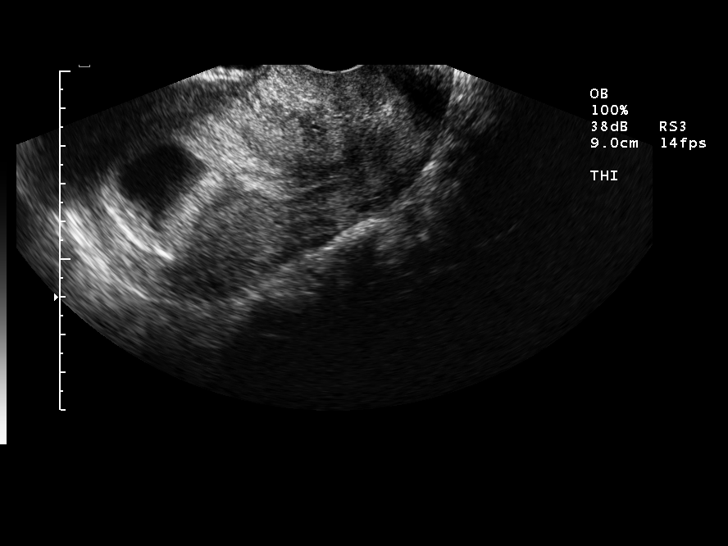
[im 27/43]
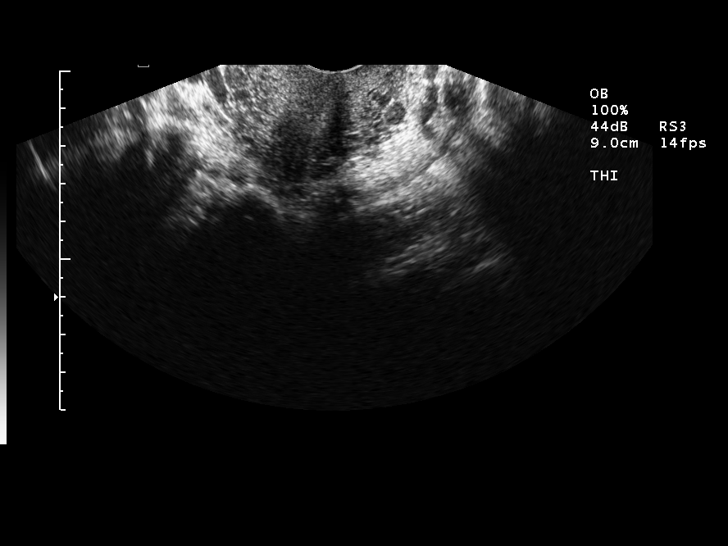
[im 30/43]
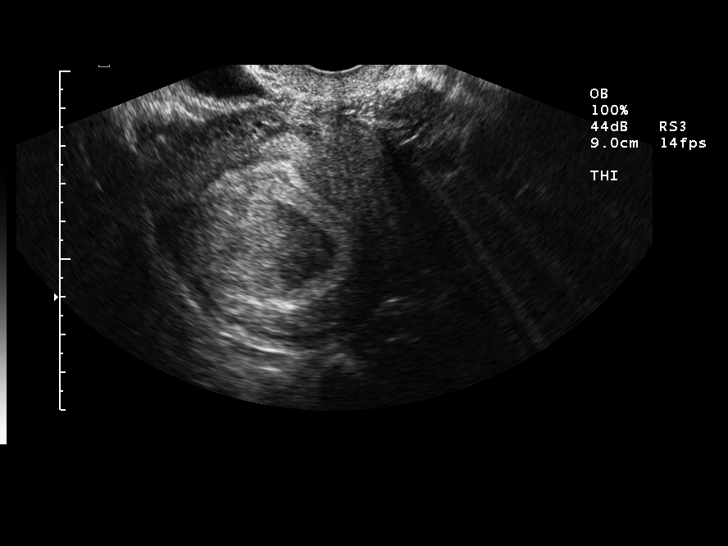
[im 33/43]
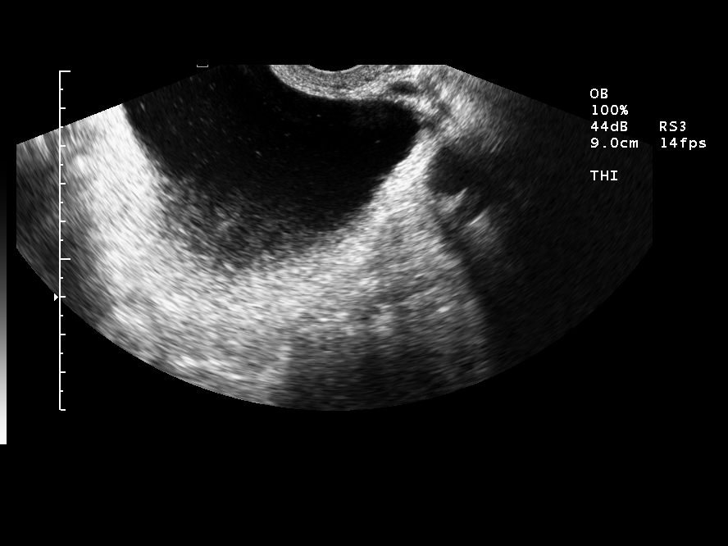
[im 36/43]
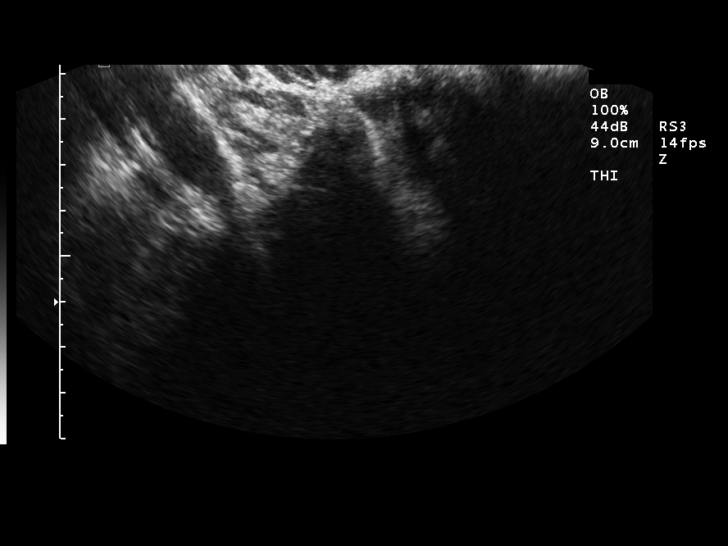
[im 39/43]
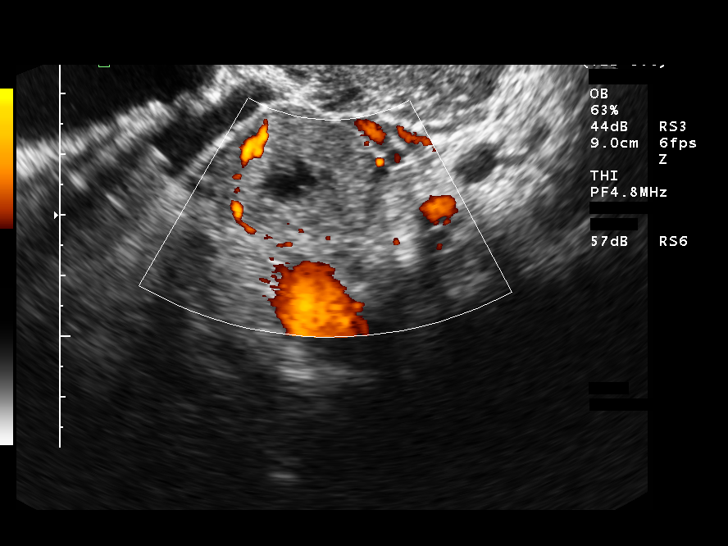
[im 43/43]
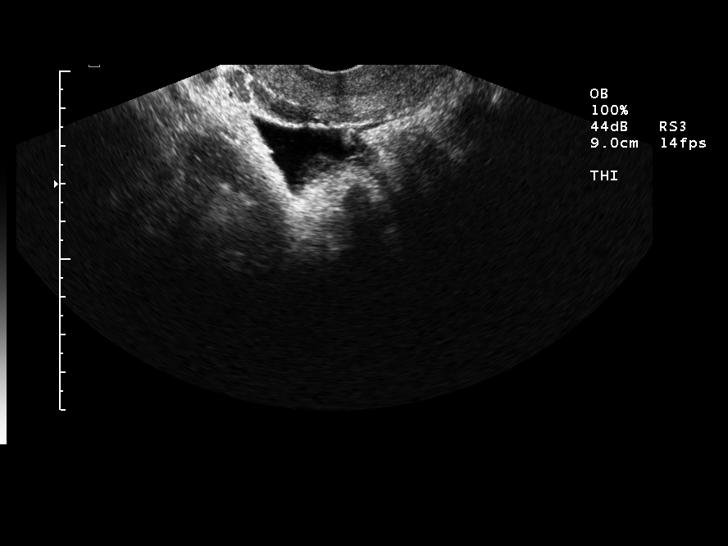

[14 of 28 positions shown; findings below may reference images not displayed]

FINDINGS: Single living intrauterine pregnancy is identified with mean sac diameter of 7.7 mm compatible with estimated gestational age of 6 weeks 5 days.  Cardiac activity is detected with a heart rate of 134 bpm.  No subchorionic hemorrhage.  Both ovaries are unremarkable with the right measuring 2.7 x 1.4 x 1.7 cm and the left measuring 3.1 x 2.5 x 1.9 cm.  Small amount of free pelvic fluid is noted.
IMPRESSION: Single living intrauterine pregnancy with an estimated gestational age of 6 weeks 5 days.

## 2006-08-17 DIAGNOSIS — D509 Iron deficiency anemia, unspecified: Secondary | ICD-10-CM | POA: Insufficient documentation

## 2006-08-17 DIAGNOSIS — F339 Major depressive disorder, recurrent, unspecified: Secondary | ICD-10-CM | POA: Insufficient documentation

## 2006-08-17 DIAGNOSIS — G4700 Insomnia, unspecified: Secondary | ICD-10-CM | POA: Insufficient documentation

## 2006-08-17 DIAGNOSIS — R8789 Other abnormal findings in specimens from female genital organs: Secondary | ICD-10-CM

## 2006-08-17 DIAGNOSIS — E669 Obesity, unspecified: Secondary | ICD-10-CM

## 2006-08-17 DIAGNOSIS — Z862 Personal history of diseases of the blood and blood-forming organs and certain disorders involving the immune mechanism: Secondary | ICD-10-CM | POA: Insufficient documentation

## 2006-08-17 DIAGNOSIS — F17200 Nicotine dependence, unspecified, uncomplicated: Secondary | ICD-10-CM

## 2006-08-17 DIAGNOSIS — G43909 Migraine, unspecified, not intractable, without status migrainosus: Secondary | ICD-10-CM | POA: Insufficient documentation

## 2006-08-18 ENCOUNTER — Encounter (INDEPENDENT_AMBULATORY_CARE_PROVIDER_SITE_OTHER): Payer: Self-pay | Admitting: *Deleted

## 2006-09-01 ENCOUNTER — Inpatient Hospital Stay (HOSPITAL_COMMUNITY): Admission: AD | Admit: 2006-09-01 | Discharge: 2006-09-01 | Payer: Self-pay | Admitting: Obstetrics & Gynecology

## 2006-09-30 ENCOUNTER — Inpatient Hospital Stay (HOSPITAL_COMMUNITY): Admission: AD | Admit: 2006-09-30 | Discharge: 2006-09-30 | Payer: Self-pay | Admitting: Obstetrics & Gynecology

## 2006-10-17 ENCOUNTER — Inpatient Hospital Stay (HOSPITAL_COMMUNITY): Admission: AD | Admit: 2006-10-17 | Discharge: 2006-10-17 | Payer: Self-pay | Admitting: Gynecology

## 2006-11-04 ENCOUNTER — Emergency Department (HOSPITAL_COMMUNITY): Admission: EM | Admit: 2006-11-04 | Discharge: 2006-11-04 | Payer: Self-pay | Admitting: Emergency Medicine

## 2006-11-17 ENCOUNTER — Ambulatory Visit (HOSPITAL_COMMUNITY): Admission: RE | Admit: 2006-11-17 | Discharge: 2006-11-17 | Payer: Self-pay | Admitting: Family Medicine

## 2006-11-17 IMAGING — US US OB COMP +14 WK
1 series · 14 of 28 positions shown · non-contrast
Comparison: none

OBSTETRICAL ULTRASOUND:

 This ultrasound exam was performed in the [HOSPITAL] Ultrasound Department.  The OB US report was generated in the AS system, and faxed to the ordering physician.  This report is also available in [REDACTED] PACS.

[Series 1: us ob comp +14 wk · 0.32mm/px · 14 of 73 slices shown]
[im 3/73]
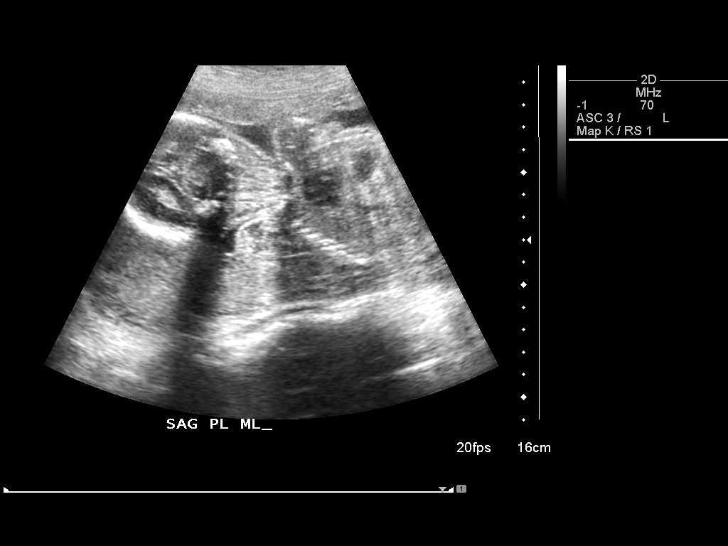
[im 9/73]
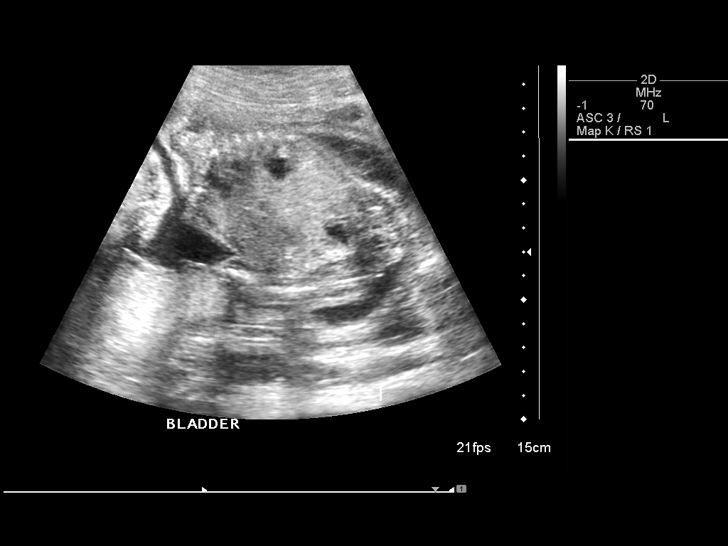
[im 14/73]
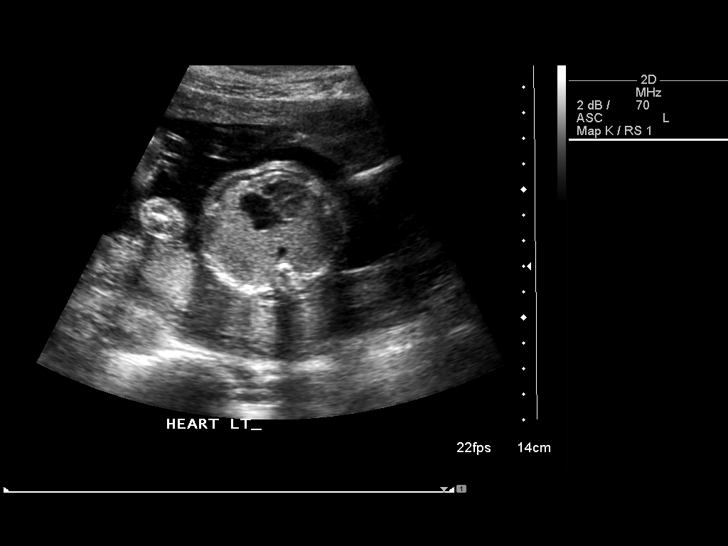
[im 19/73]
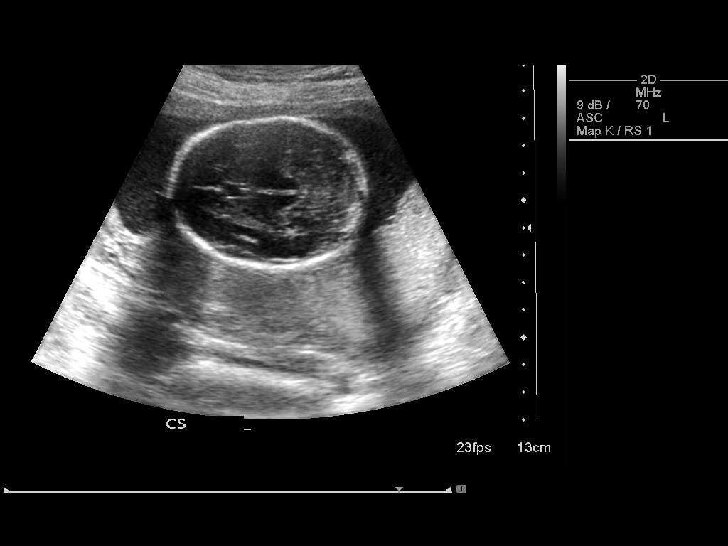
[im 25/73]
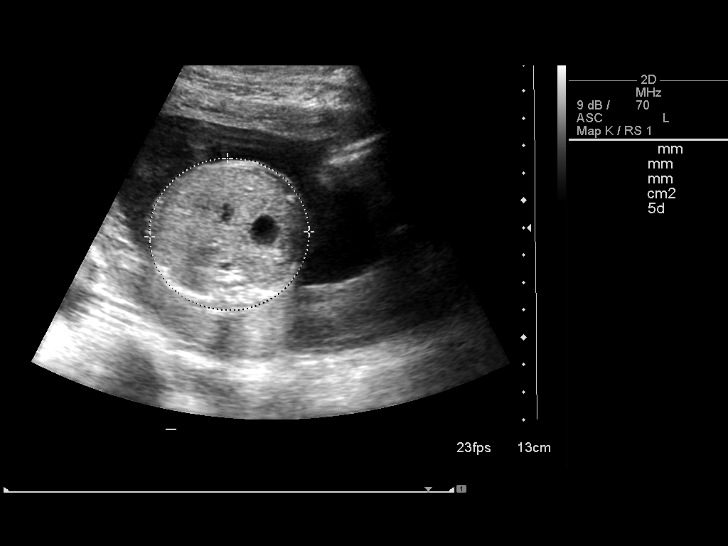
[im 30/73]
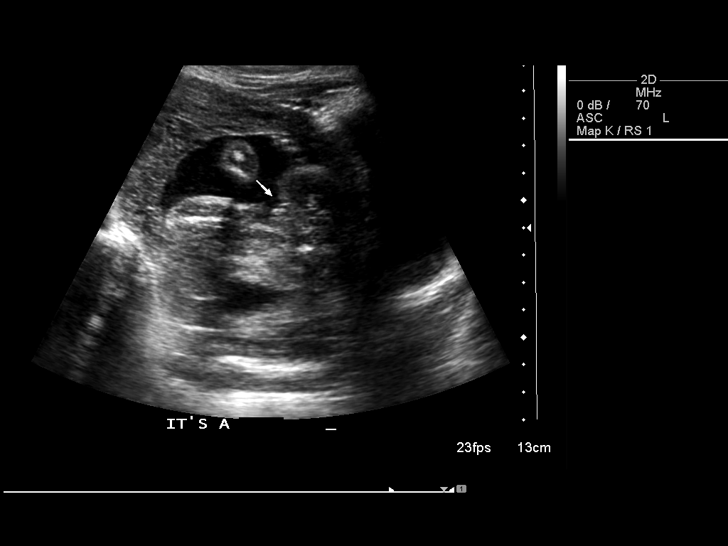
[im 35/73]
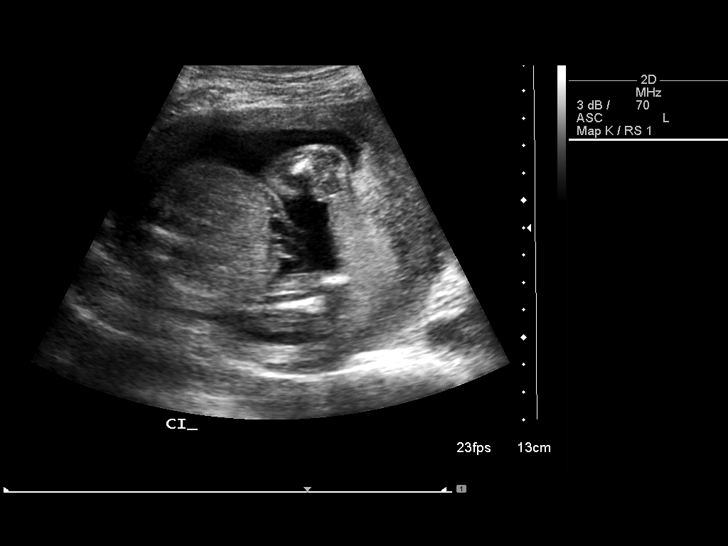
[im 41/73]
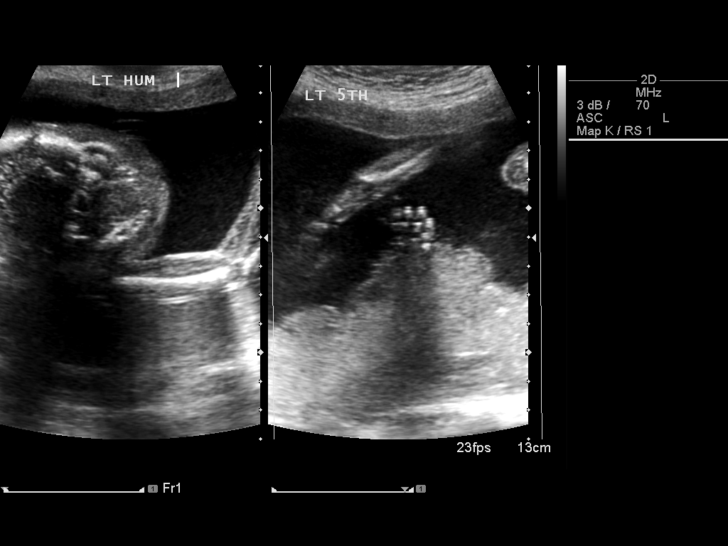
[im 46/73]
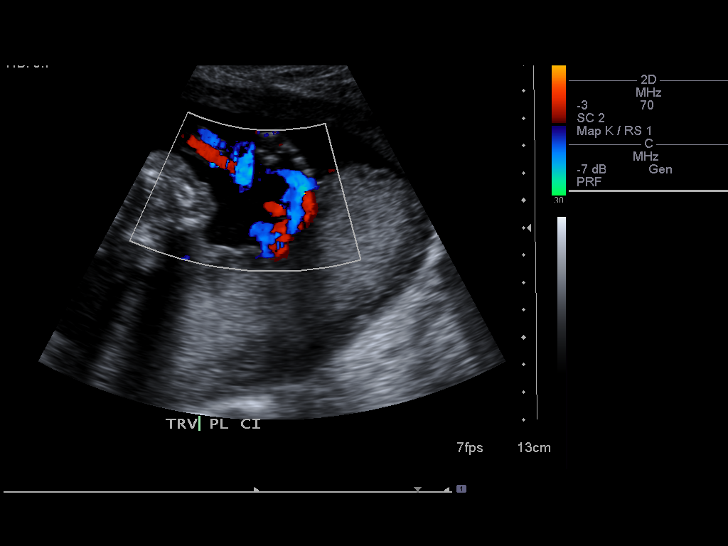
[im 51/73]
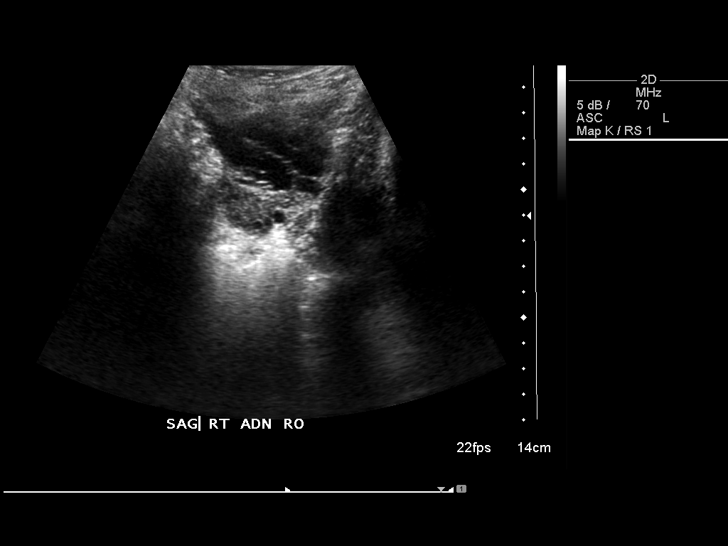
[im 57/73]
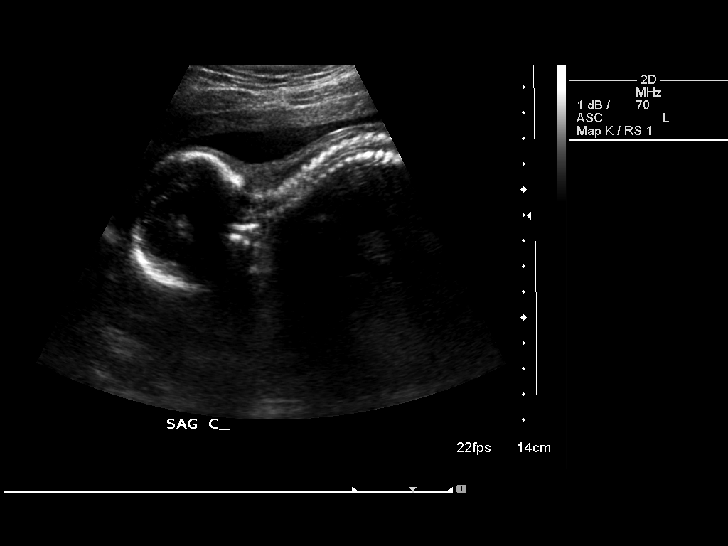
[im 62/73]
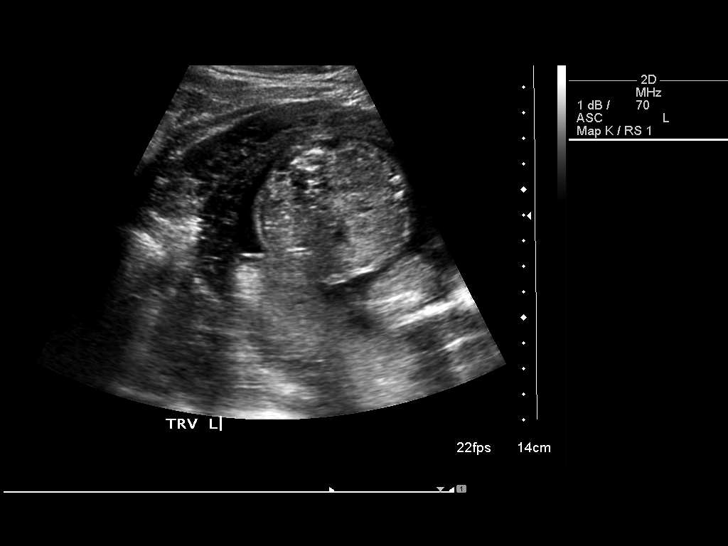
[im 67/73]
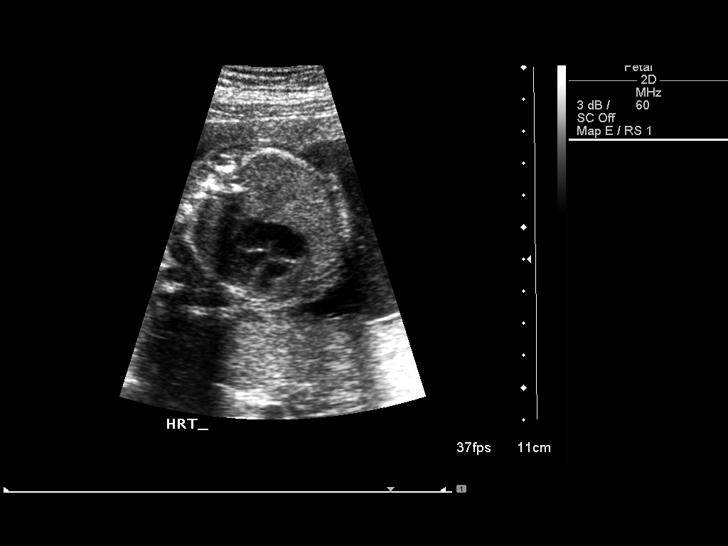
[im 73/73]
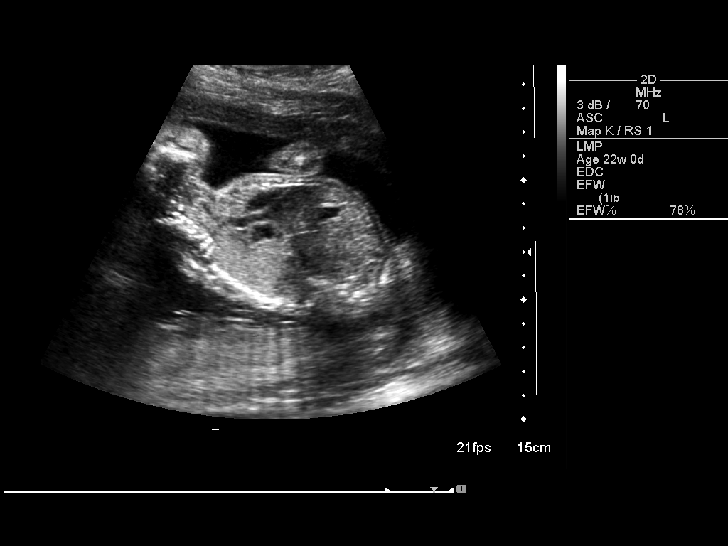

[14 of 28 positions shown; findings below may reference images not displayed]

IMPRESSION: See AS Obstetric US report.

## 2006-11-17 IMAGING — US US OB COMP +14 WK
1 series · 1 of 1 positions shown · non-contrast
Comparison: none

OBSTETRICAL ULTRASOUND:

 This ultrasound exam was performed in the [HOSPITAL] Ultrasound Department.  The OB US report was generated in the AS system, and faxed to the ordering physician.  This report is also available in [REDACTED] PACS.

[Series 1: us ob comp +14 wk · 0.30mm/px · 1 of 1 slices shown]
[im 1/1]
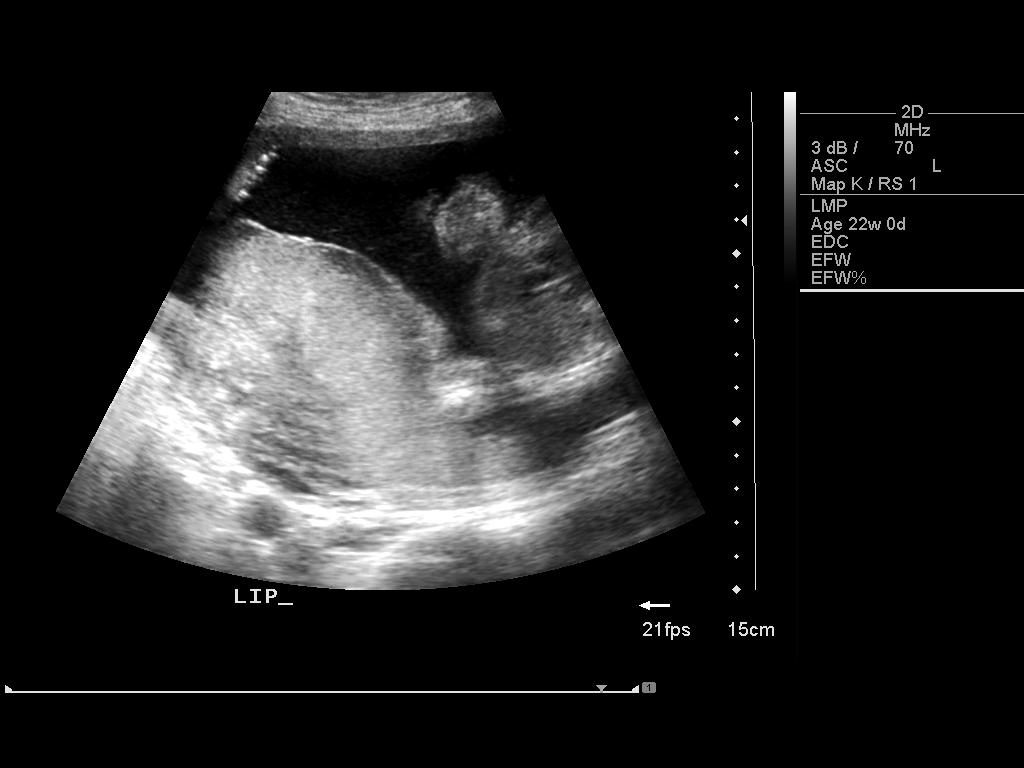

[1 of 1 positions shown; findings below may reference images not displayed]

IMPRESSION: See AS Obstetric US report.

## 2006-12-11 ENCOUNTER — Ambulatory Visit: Payer: Self-pay | Admitting: *Deleted

## 2006-12-11 ENCOUNTER — Inpatient Hospital Stay (HOSPITAL_COMMUNITY): Admission: AD | Admit: 2006-12-11 | Discharge: 2006-12-11 | Payer: Self-pay | Admitting: Family Medicine

## 2007-01-10 ENCOUNTER — Ambulatory Visit: Payer: Self-pay | Admitting: *Deleted

## 2007-02-02 ENCOUNTER — Ambulatory Visit: Payer: Self-pay | Admitting: Obstetrics & Gynecology

## 2007-02-02 ENCOUNTER — Observation Stay (HOSPITAL_COMMUNITY): Admission: AD | Admit: 2007-02-02 | Discharge: 2007-02-03 | Payer: Self-pay | Admitting: Obstetrics & Gynecology

## 2007-02-02 IMAGING — US US OB FOLLOW-UP
1 series · 14 of 28 positions shown · non-contrast
Comparison: none

OBSTETRICAL ULTRASOUND:

 This ultrasound exam was performed in the [HOSPITAL] Ultrasound Department.  The OB US report was generated in the AS system, and faxed to the ordering physician.  This report is also available in [REDACTED] PACS.

[Series 1: us ob comp +14 wk · 14 of 40 slices shown]
[im 2/40]
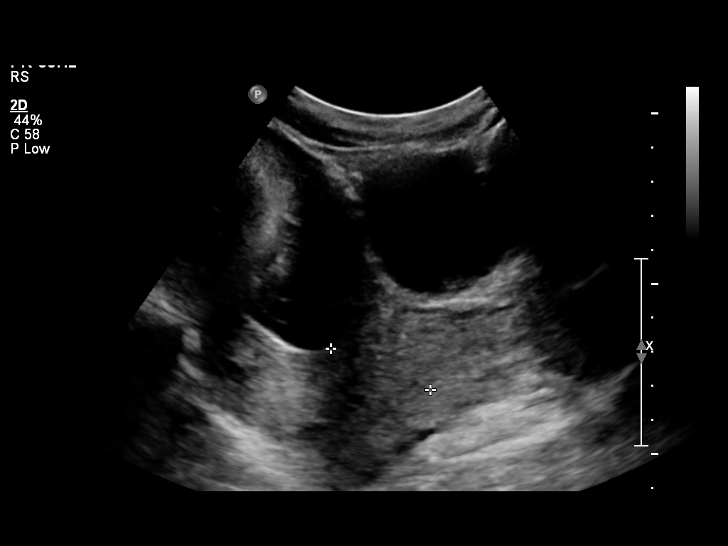
[im 5/40]
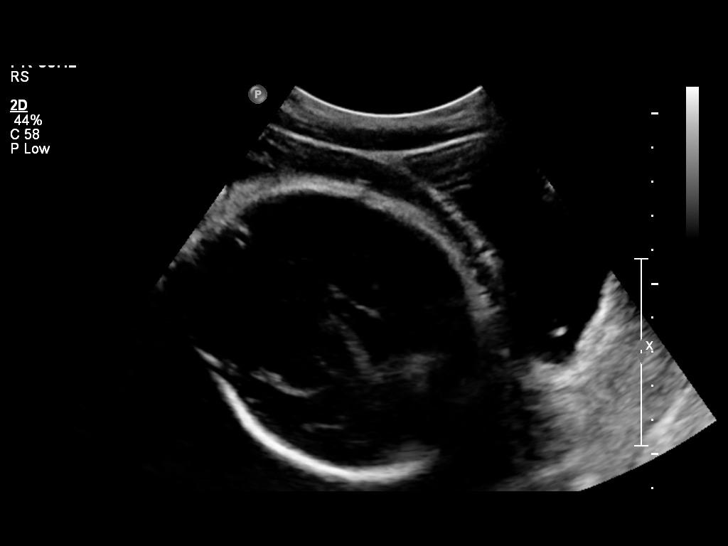
[im 8/40]
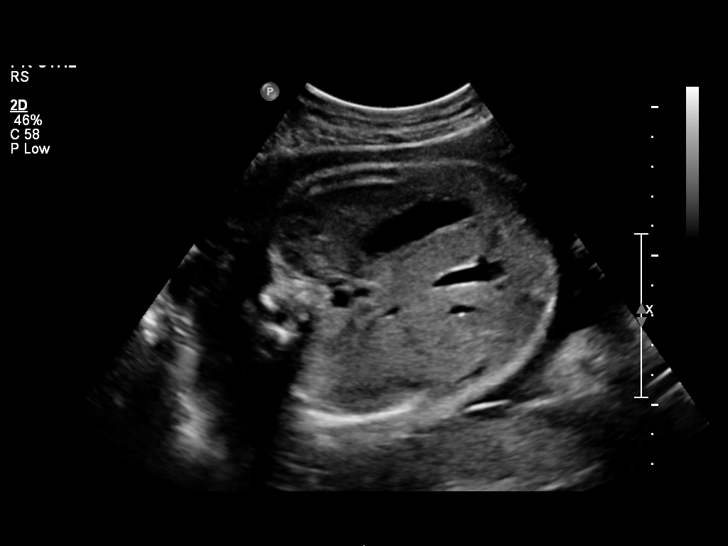
[im 11/40]
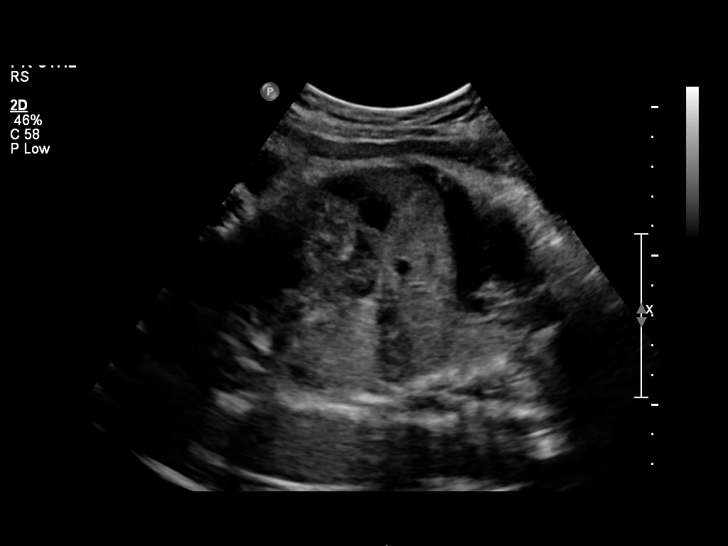
[im 14/40]
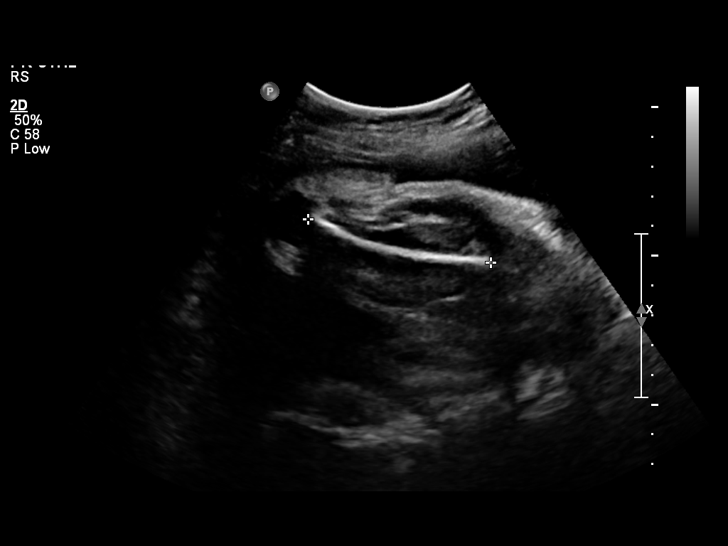
[im 16/40]
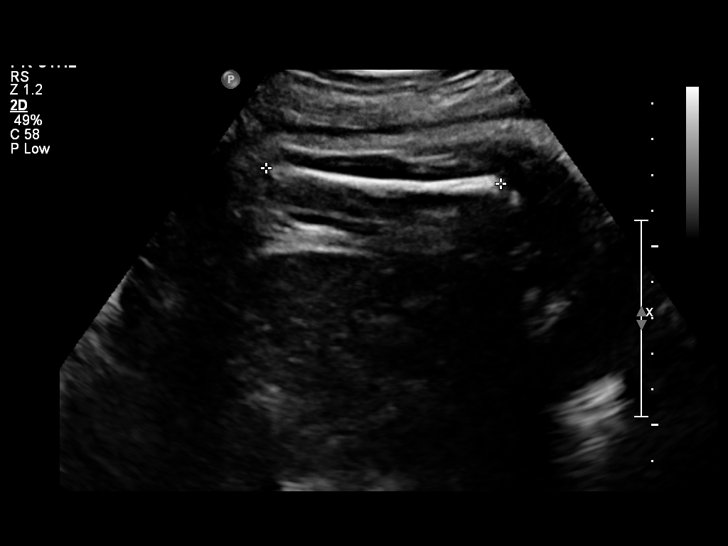
[im 19/40]
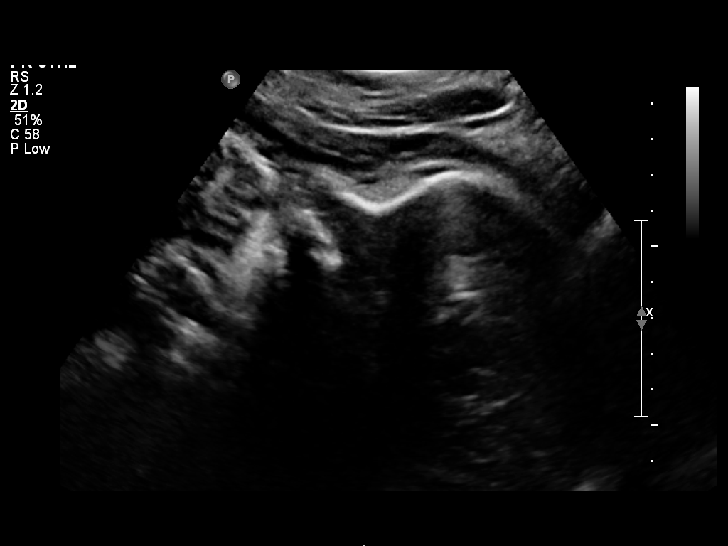
[im 22/40]
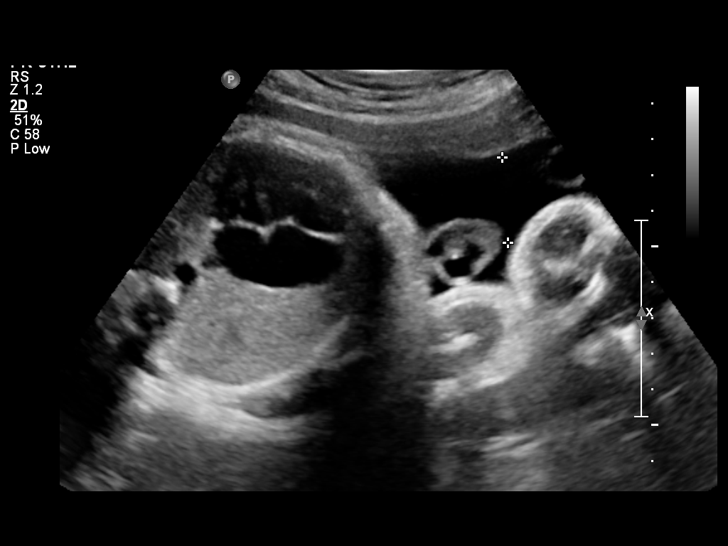
[im 25/40]
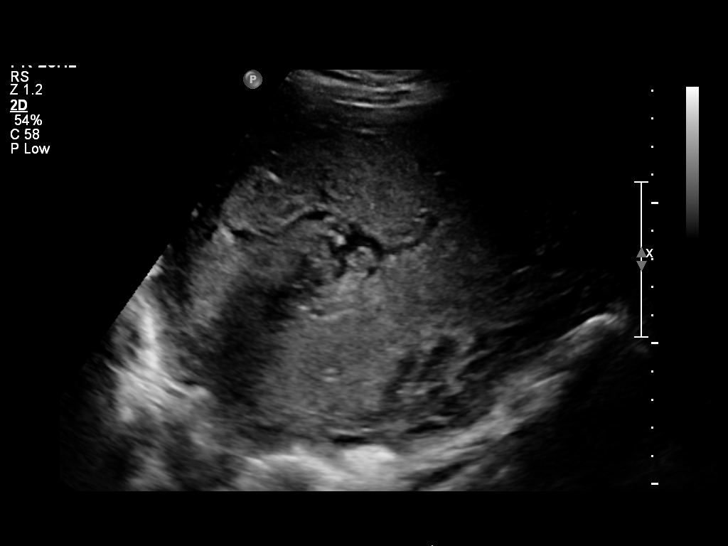
[im 28/40]
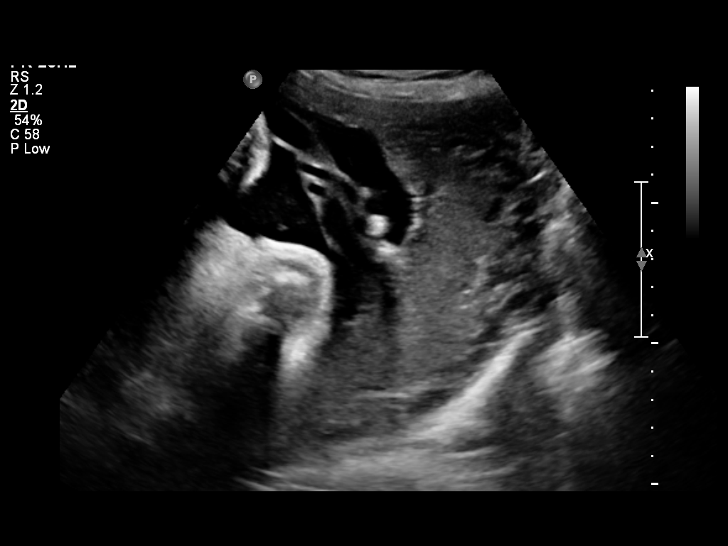
[im 31/40]
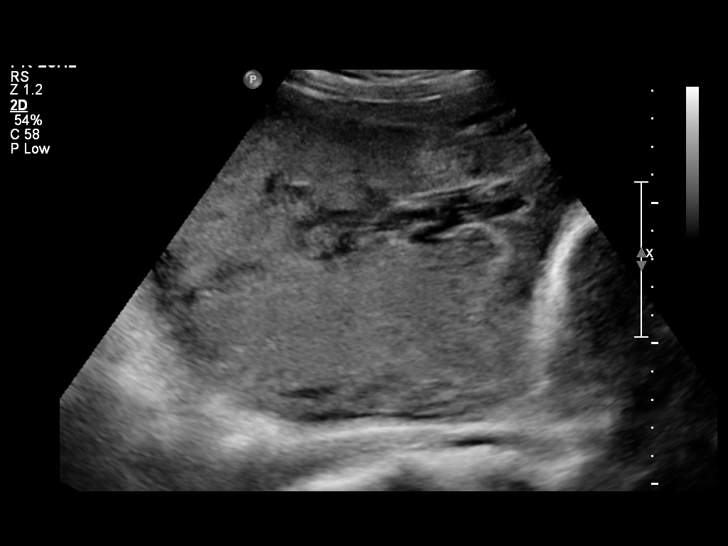
[im 34/40]
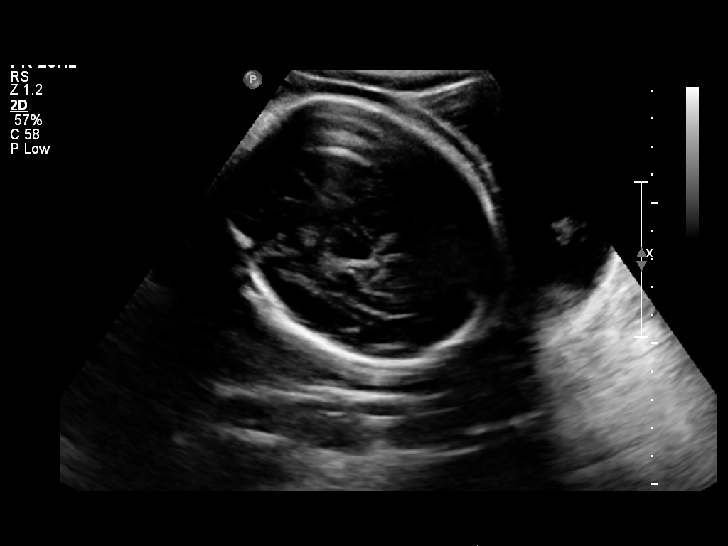
[im 37/40]
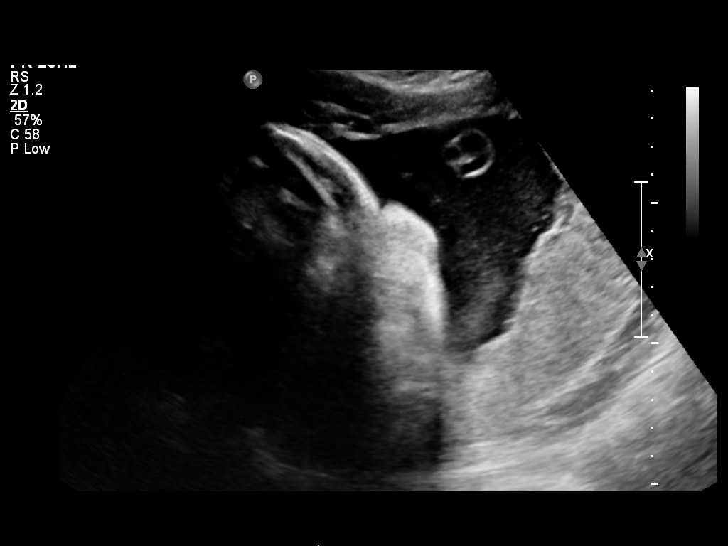
[im 40/40]
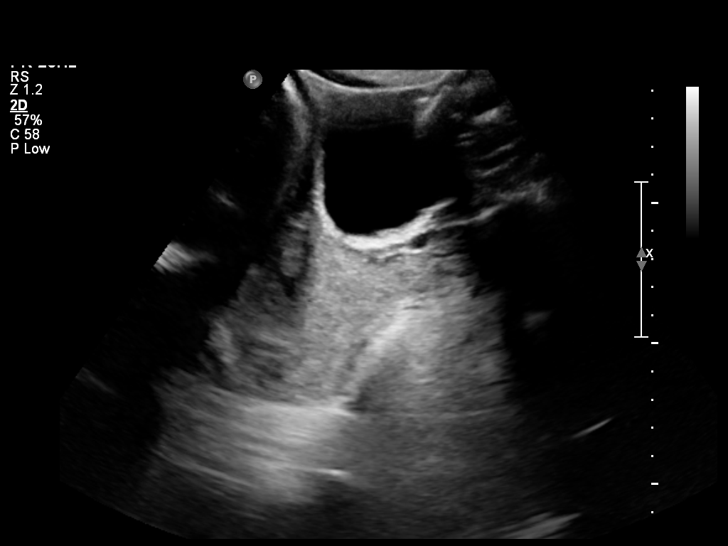

[14 of 28 positions shown; findings below may reference images not displayed]

IMPRESSION: See AS Obstetric US report.

## 2007-02-05 ENCOUNTER — Ambulatory Visit: Payer: Self-pay | Admitting: *Deleted

## 2007-02-14 ENCOUNTER — Inpatient Hospital Stay (HOSPITAL_COMMUNITY): Admission: AD | Admit: 2007-02-14 | Discharge: 2007-02-14 | Payer: Self-pay | Admitting: Gynecology

## 2007-02-14 ENCOUNTER — Ambulatory Visit: Payer: Self-pay | Admitting: Obstetrics and Gynecology

## 2007-02-24 ENCOUNTER — Ambulatory Visit: Payer: Self-pay | Admitting: Obstetrics and Gynecology

## 2007-02-24 ENCOUNTER — Inpatient Hospital Stay (HOSPITAL_COMMUNITY): Admission: AD | Admit: 2007-02-24 | Discharge: 2007-02-26 | Payer: Self-pay | Admitting: Obstetrics and Gynecology

## 2007-04-10 ENCOUNTER — Inpatient Hospital Stay (HOSPITAL_COMMUNITY): Admission: AD | Admit: 2007-04-10 | Discharge: 2007-04-11 | Payer: Self-pay | Admitting: Obstetrics and Gynecology

## 2007-05-03 ENCOUNTER — Encounter: Payer: Self-pay | Admitting: Obstetrics & Gynecology

## 2007-05-03 ENCOUNTER — Other Ambulatory Visit: Admission: RE | Admit: 2007-05-03 | Discharge: 2007-05-03 | Payer: Self-pay | Admitting: Obstetrics & Gynecology

## 2007-05-03 ENCOUNTER — Ambulatory Visit: Payer: Self-pay | Admitting: Obstetrics & Gynecology

## 2007-12-07 ENCOUNTER — Emergency Department (HOSPITAL_COMMUNITY): Admission: EM | Admit: 2007-12-07 | Discharge: 2007-12-07 | Payer: Self-pay | Admitting: Family Medicine

## 2008-02-17 ENCOUNTER — Emergency Department (HOSPITAL_COMMUNITY): Admission: EM | Admit: 2008-02-17 | Discharge: 2008-02-17 | Payer: Self-pay | Admitting: Emergency Medicine

## 2008-02-17 IMAGING — CR DG CHEST 2V
2 series · 2 of 2 positions shown · non-contrast
Comparison: None.

CLINICAL DATA: Chest tightness and shortness of breath for 5 days.
Recent bronchitis.

CHEST - 2 VIEW

[w chest pa]
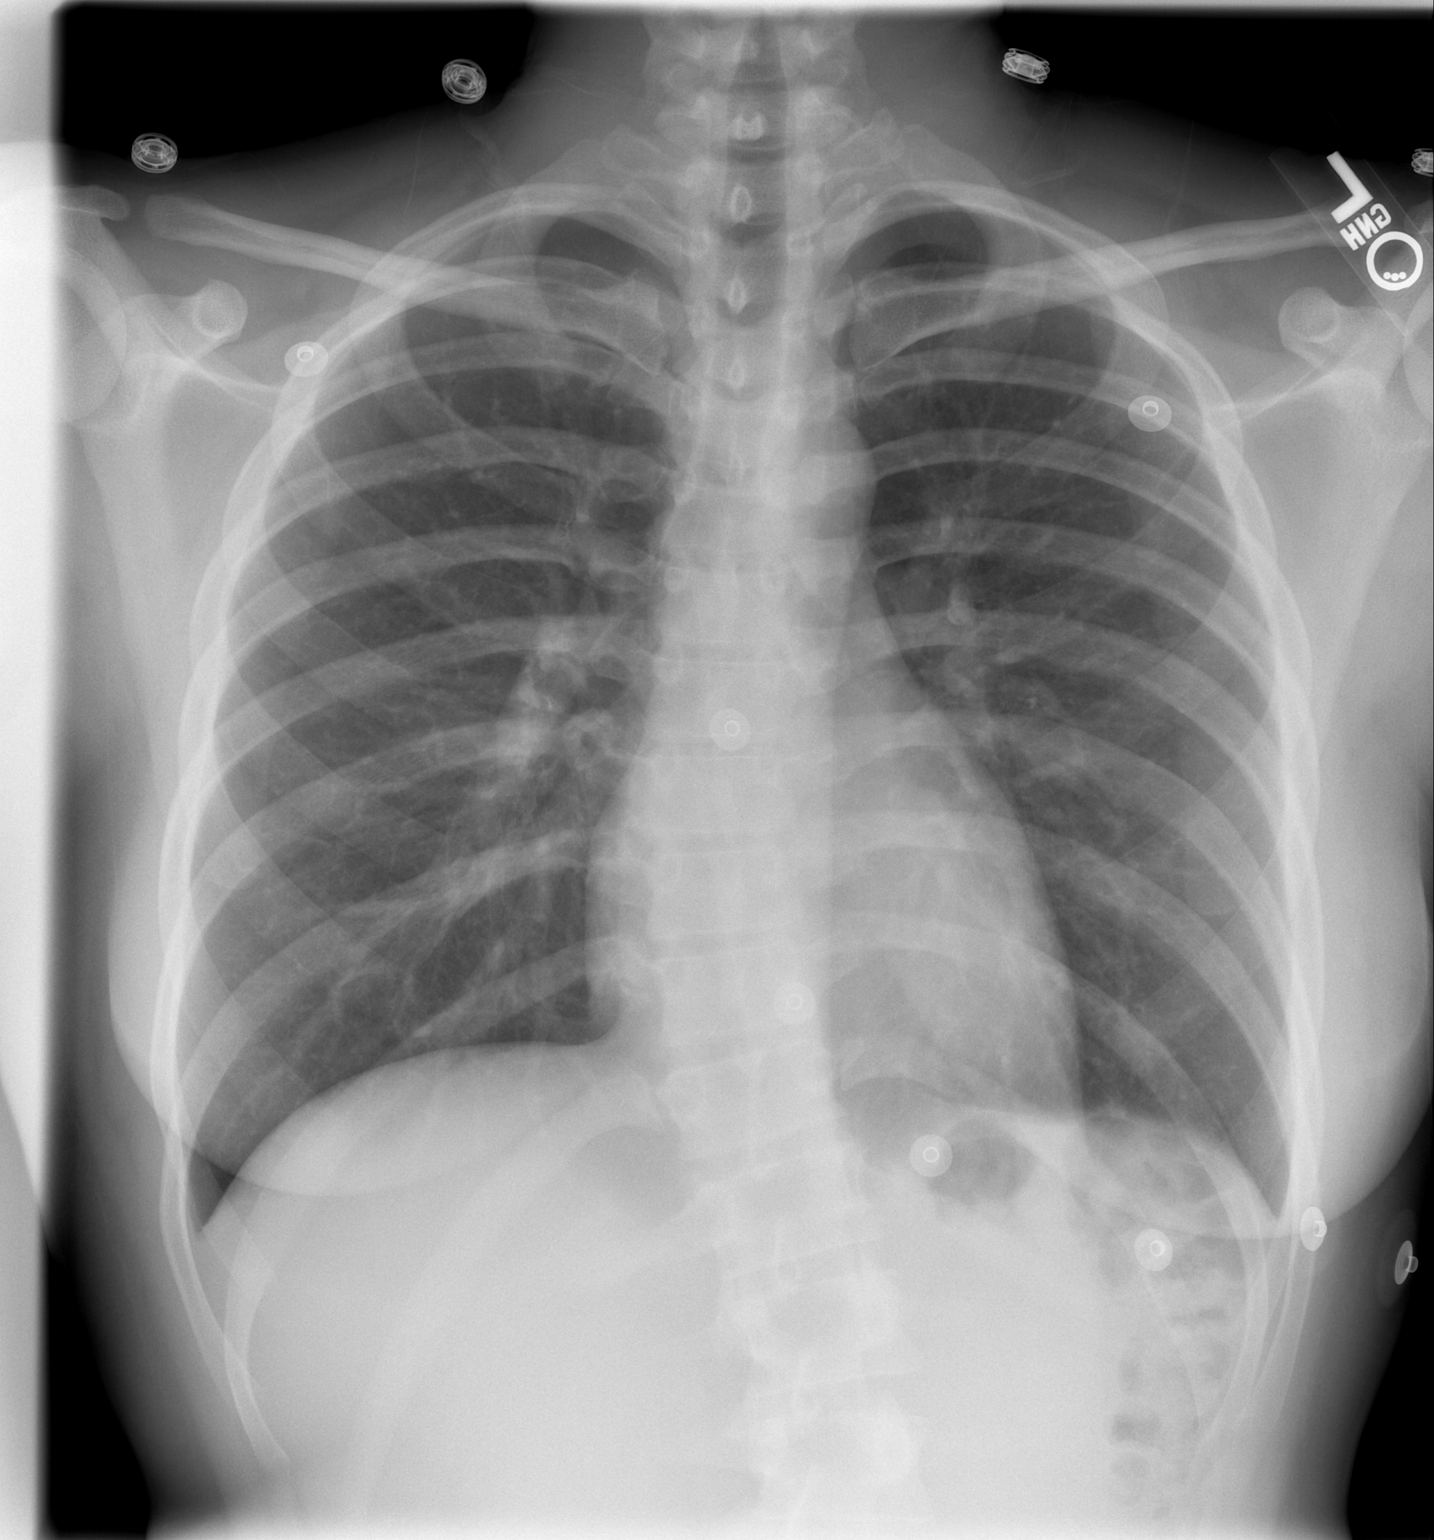

[w chest lat]
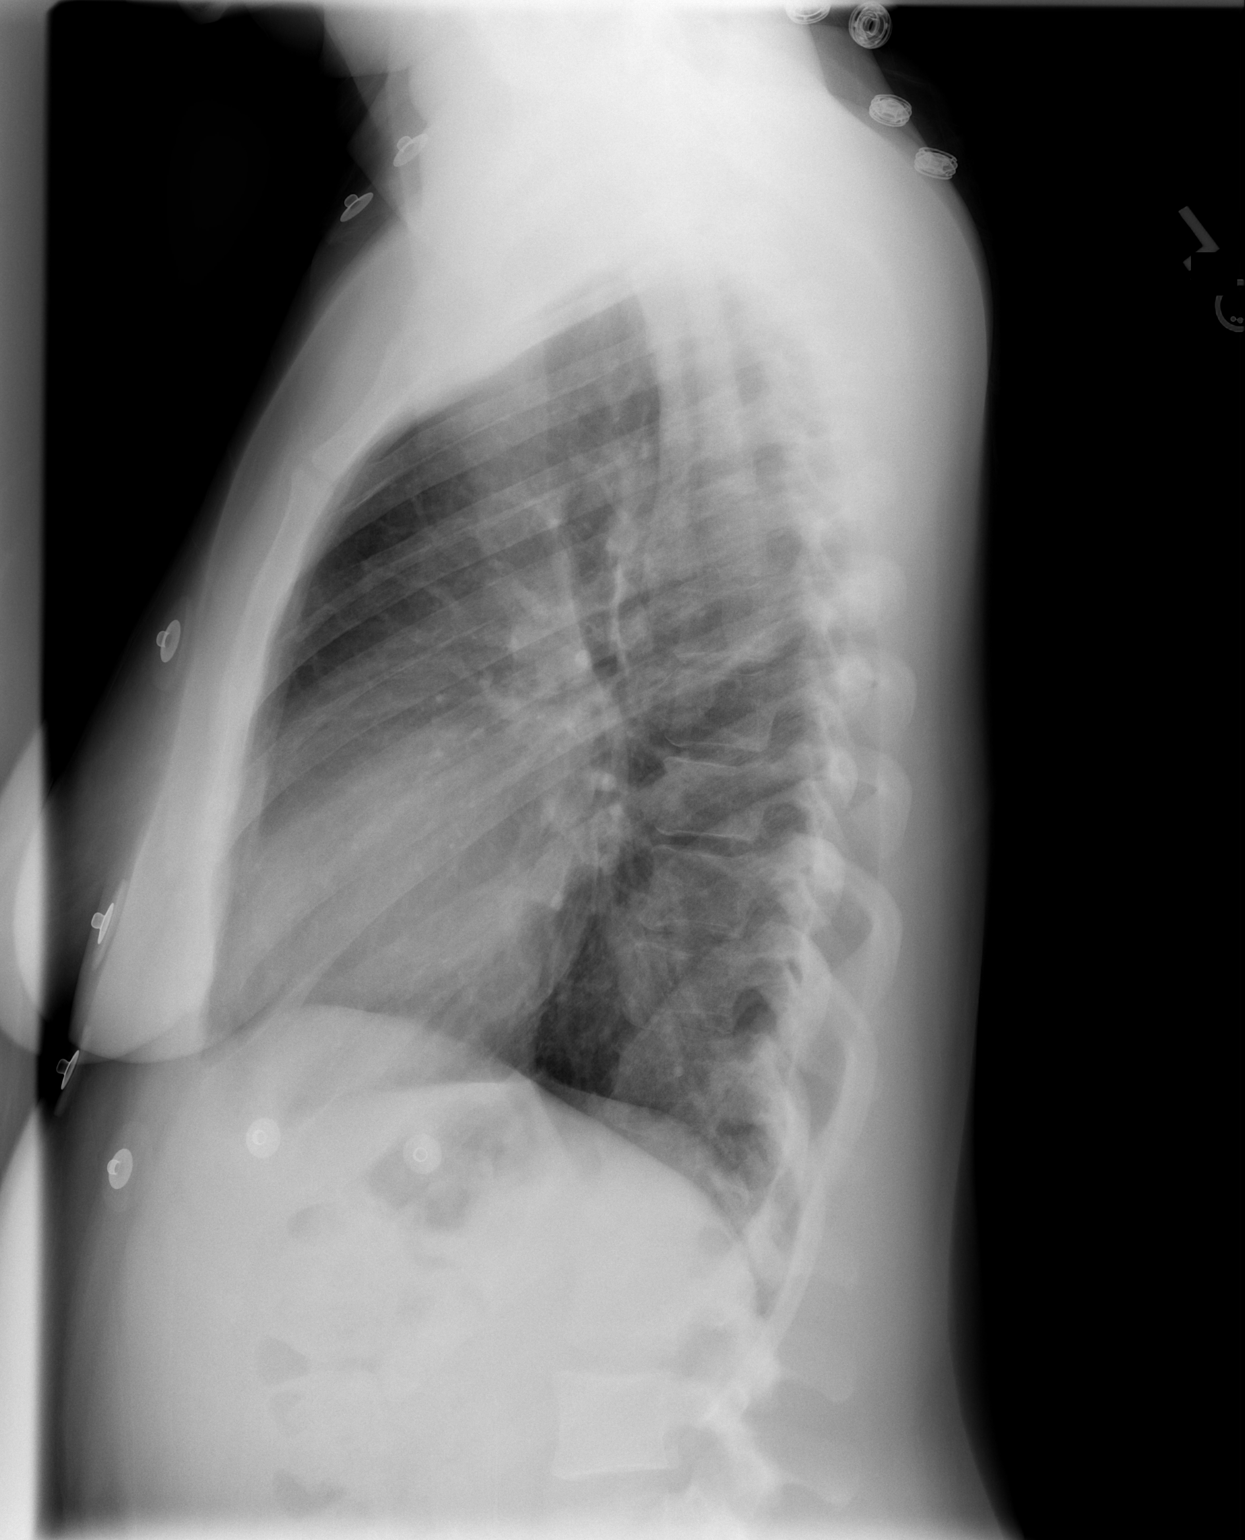

[2 of 2 positions shown; findings below may reference images not displayed]

FINDINGS: The heart size and mediastinal contours are normal.  The
lungs are clear.  No significant central airway thickening is
demonstrated.  There is a thoracolumbar scoliosis.
IMPRESSION: No acute chest findings.  Scoliosis.

## 2008-03-07 ENCOUNTER — Encounter: Payer: Self-pay | Admitting: *Deleted

## 2008-03-07 ENCOUNTER — Ambulatory Visit: Payer: Self-pay | Admitting: Family Medicine

## 2008-03-07 DIAGNOSIS — K219 Gastro-esophageal reflux disease without esophagitis: Secondary | ICD-10-CM

## 2008-06-14 ENCOUNTER — Emergency Department (HOSPITAL_COMMUNITY): Admission: EM | Admit: 2008-06-14 | Discharge: 2008-06-14 | Payer: Self-pay | Admitting: Family Medicine

## 2008-06-20 DIAGNOSIS — N730 Acute parametritis and pelvic cellulitis: Secondary | ICD-10-CM

## 2008-06-20 HISTORY — DX: Acute parametritis and pelvic cellulitis: N73.0

## 2008-10-17 ENCOUNTER — Encounter: Payer: Self-pay | Admitting: Family Medicine

## 2009-02-12 ENCOUNTER — Telehealth: Payer: Self-pay | Admitting: *Deleted

## 2009-05-13 ENCOUNTER — Emergency Department (HOSPITAL_COMMUNITY): Admission: EM | Admit: 2009-05-13 | Discharge: 2009-05-13 | Payer: Self-pay | Admitting: Emergency Medicine

## 2009-07-21 ENCOUNTER — Ambulatory Visit: Payer: Self-pay | Admitting: Family Medicine

## 2009-07-21 ENCOUNTER — Telehealth: Payer: Self-pay | Admitting: *Deleted

## 2009-08-03 ENCOUNTER — Emergency Department (HOSPITAL_COMMUNITY): Admission: EM | Admit: 2009-08-03 | Discharge: 2009-08-03 | Payer: Self-pay | Admitting: Emergency Medicine

## 2009-12-17 ENCOUNTER — Telehealth: Payer: Self-pay | Admitting: *Deleted

## 2010-02-18 ENCOUNTER — Emergency Department (HOSPITAL_COMMUNITY): Admission: EM | Admit: 2010-02-18 | Discharge: 2010-02-19 | Payer: Self-pay | Admitting: Emergency Medicine

## 2010-02-18 IMAGING — US US OB TRANSVAGINAL MODIFY
1 series · 13 of 28 positions shown · non-contrast
Comparison: none

CLINICAL DATA: Dizziness.  Cramping.  Nausea and vomiting. Positive
urine pregnancy test.  Quantitative beta HCG not available.

OBSTETRIC <14 WK US AND TRANSVAGINAL OB US
TECHNIQUE: Both transabdominal and transvaginal ultrasound
examinations were performed for complete evaluation of the
gestation as well as the maternal uterus, adnexal regions, and
pelvic cul-de-sac.

[Series 1: us ob transvaginal modify · 0.17mm/px · 13 of 50 slices shown]
[im 2/50]
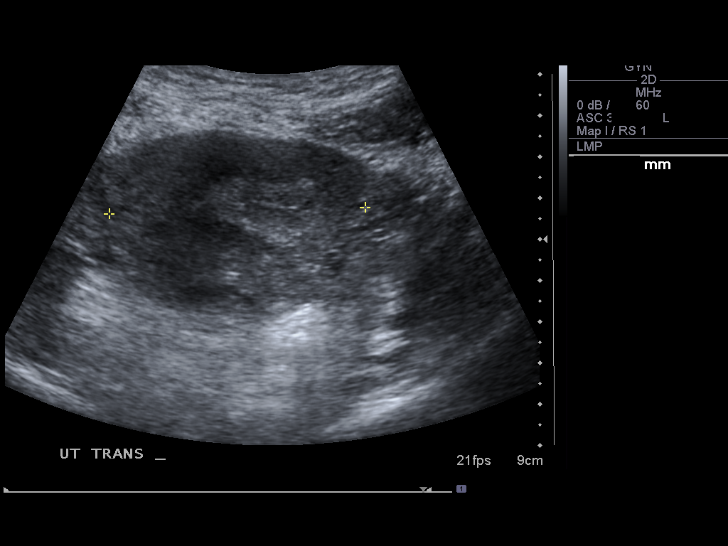
[im 6/50]
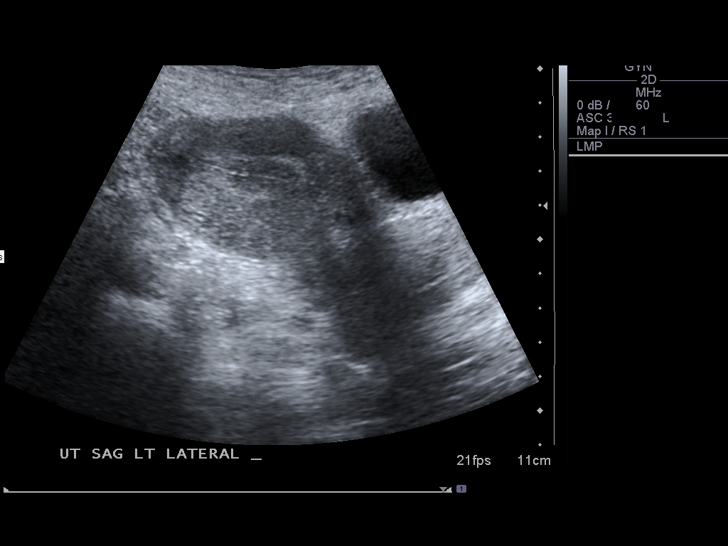
[im 10/50]
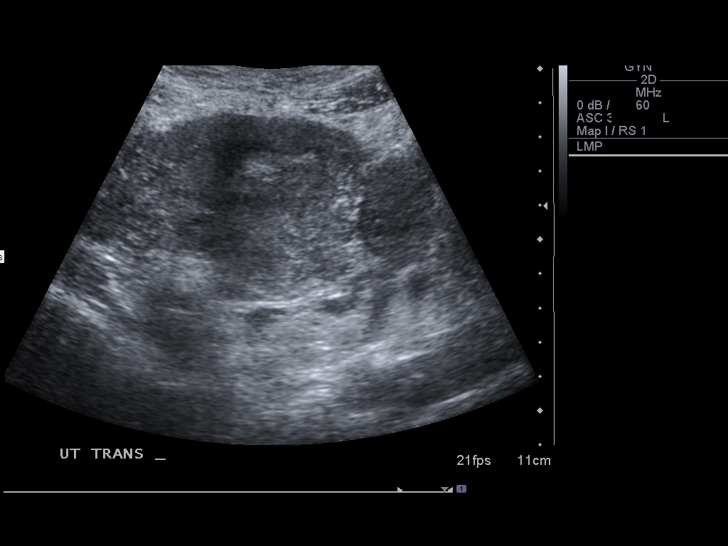
[im 13/50]
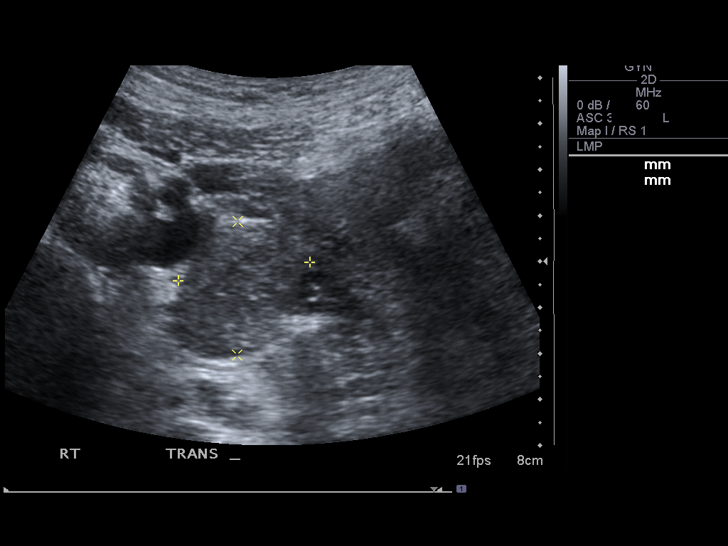
[im 17/50]
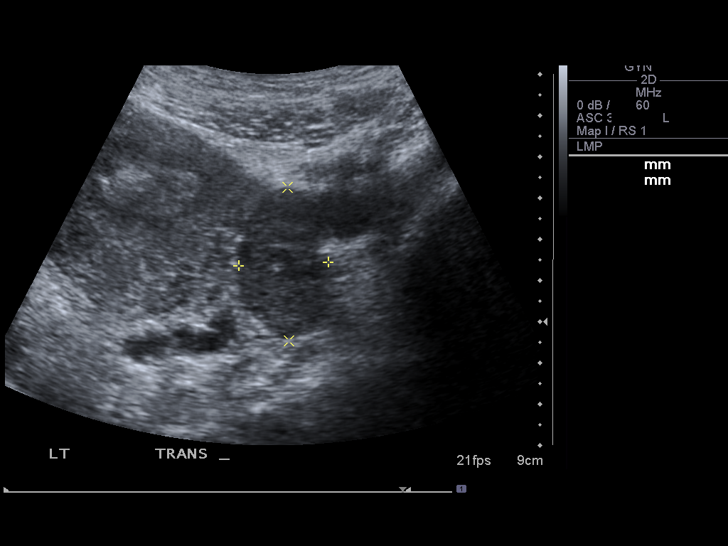
[im 20/50]
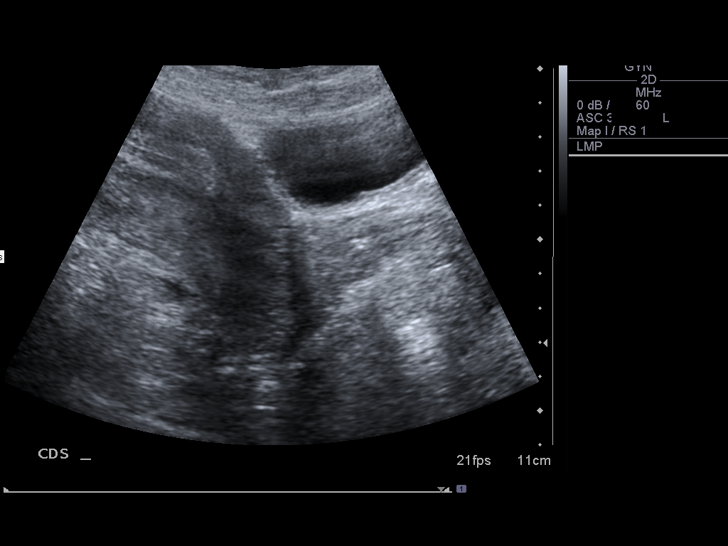
[im 26/50]
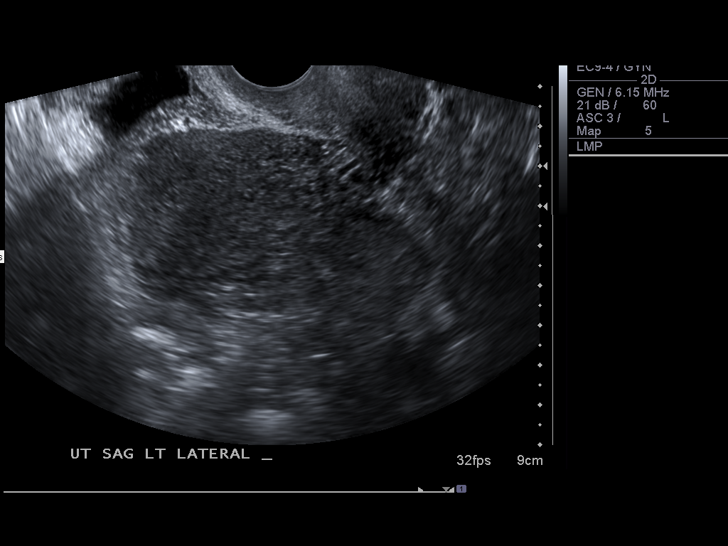
[im 30/50]
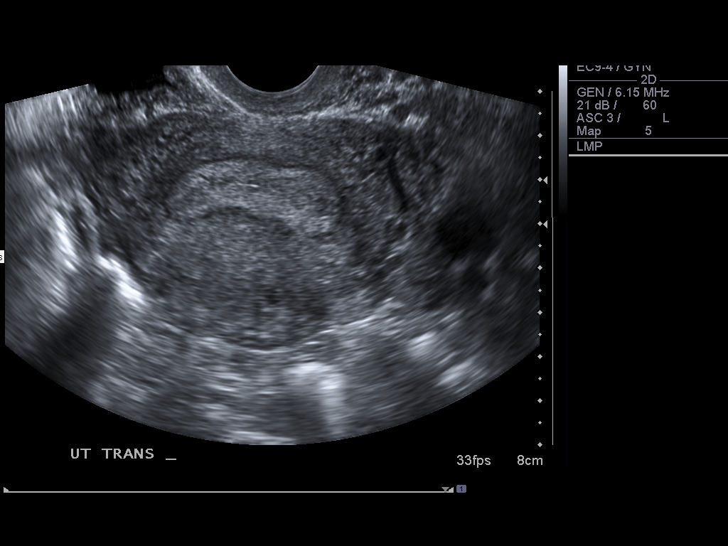
[im 33/50]
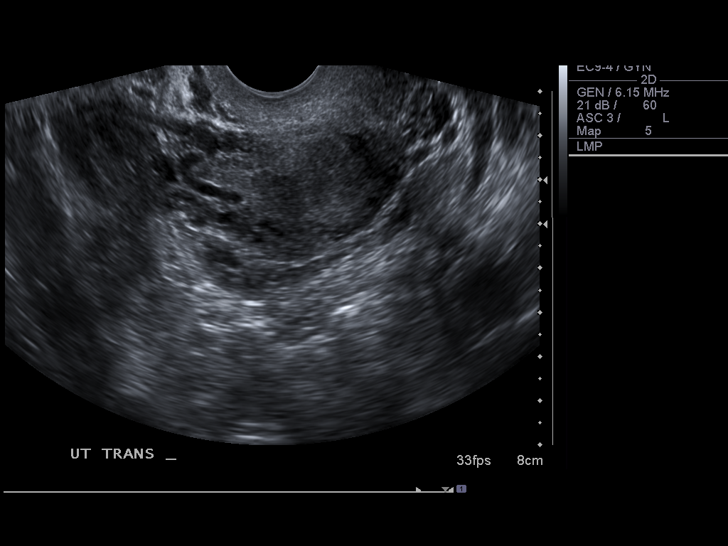
[im 37/50]
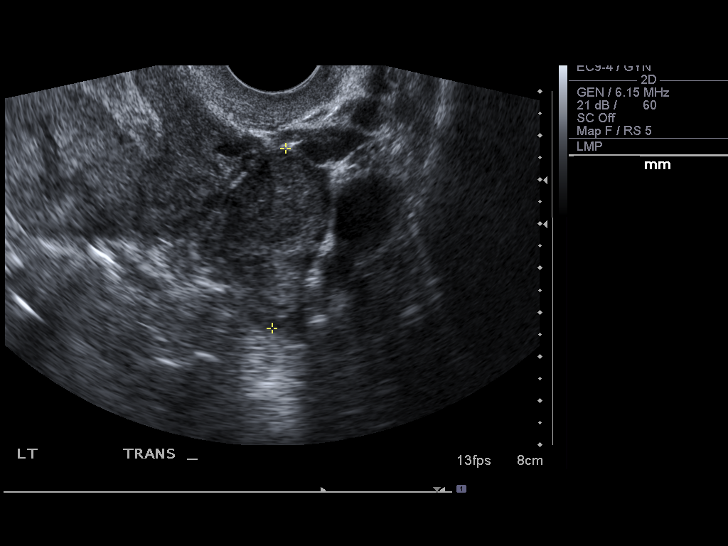
[im 40/50]
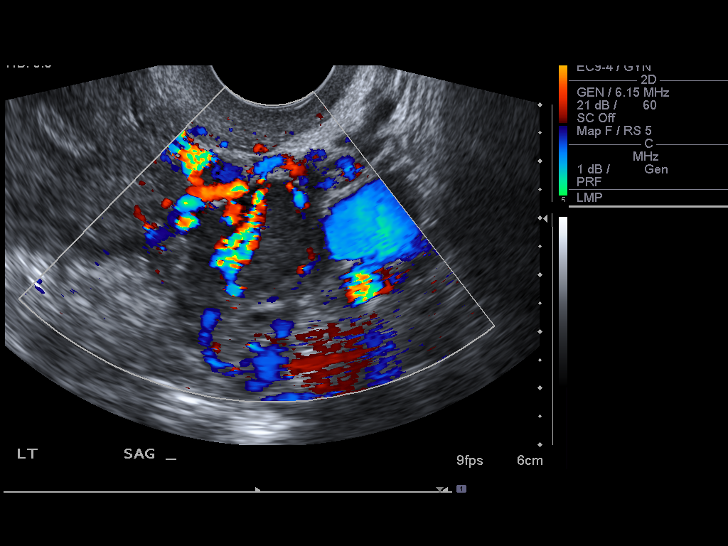
[im 44/50]
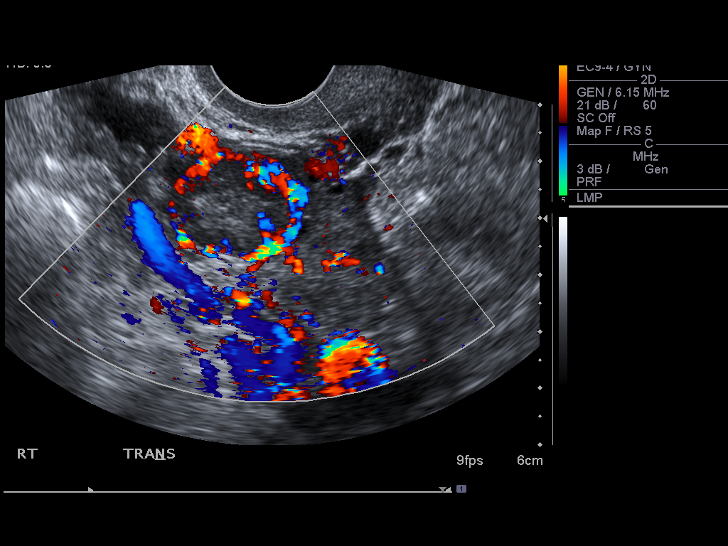
[im 48/50]
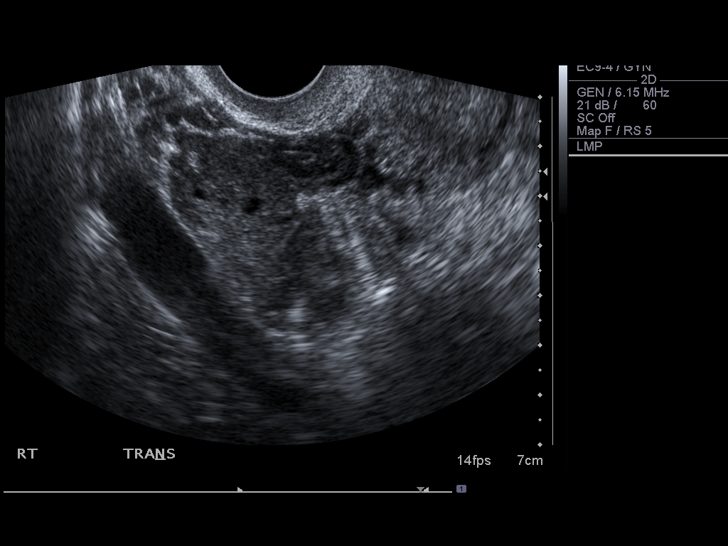

[13 of 28 positions shown; findings below may reference images not displayed]

FINDINGS: There is no intrauterine gestational sac, yolk sac, or embryo
identified. Fibroid uterus.

Right ovary measures 3.6 x 2.0 x 2.2 cm.  Heterogeneous echotexture
of the right ovary is present, with peripheral vascular flow.  This
may represent hemorrhagic cyst or corpus luteum cyst.

Left ovary measures 4.1 x 3.4 x 2.3 cm.  Vascular flow is present.
Heterogeneous echotexture is present, likely representing
hemorrhagic ovarian cyst.

Small amount of free fluid is present in the anatomic pelvis.  Free
fluid in the anatomic pelvis is simple.
IMPRESSION: Positive urine pregnancy test with no intrauterine pregnancy
identified.  The differential considerations include spontaneous
abortion, early IUP or ectopic.  Clinical follow-up and correlation
with quantitative beta HCG recommended.  Repeat ultrasound may be
obtained as needed.

## 2010-02-25 ENCOUNTER — Ambulatory Visit: Payer: Self-pay | Admitting: Nurse Practitioner

## 2010-02-25 ENCOUNTER — Inpatient Hospital Stay (HOSPITAL_COMMUNITY): Admission: AD | Admit: 2010-02-25 | Discharge: 2010-02-25 | Payer: Self-pay | Admitting: Obstetrics & Gynecology

## 2010-02-25 IMAGING — US US OB TRANSVAGINAL
1 series · 14 of 28 positions shown · non-contrast
Comparison: none

OBSTETRICAL ULTRASOUND:
 This ultrasound exam was performed in the [HOSPITAL] Ultrasound Department.  The OB US report was generated in the AS system, and faxed to the ordering physician.  This report is also available in [HOSPITAL]?s [employer] and in [REDACTED] PACS.

[Series 1: us ob transvaginal · 0.15mm/px · 14 of 32 slices shown]
[im 2/32]
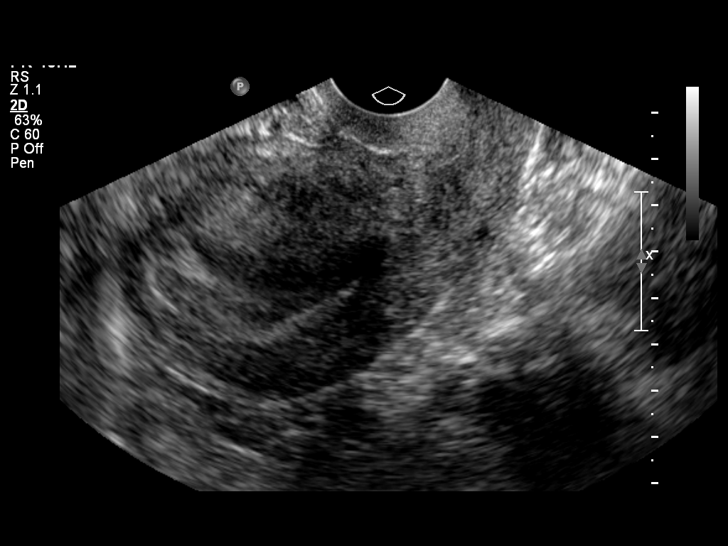
[im 4/32]
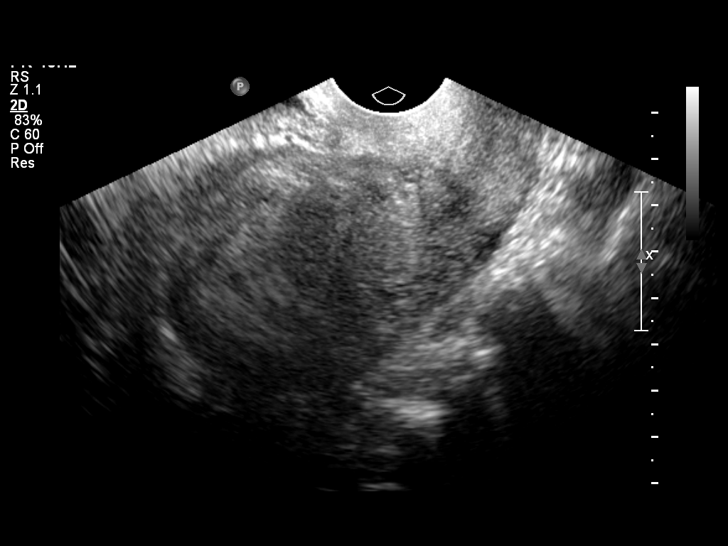
[im 6/32]
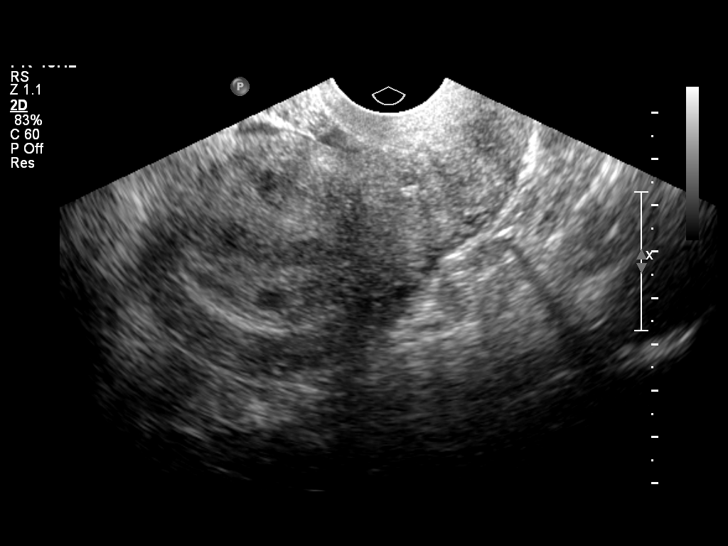
[im 9/32]
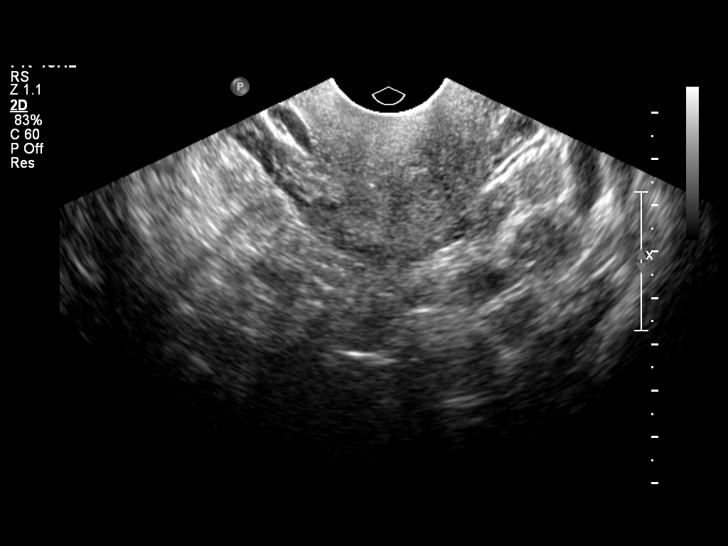
[im 11/32]
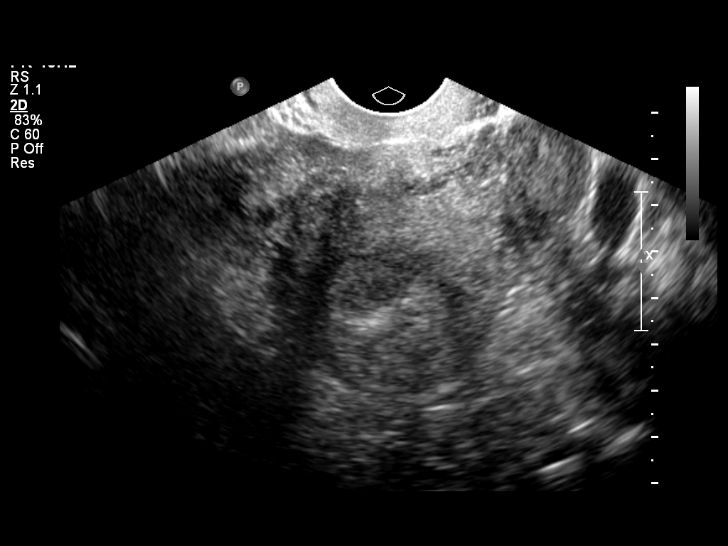
[im 13/32]
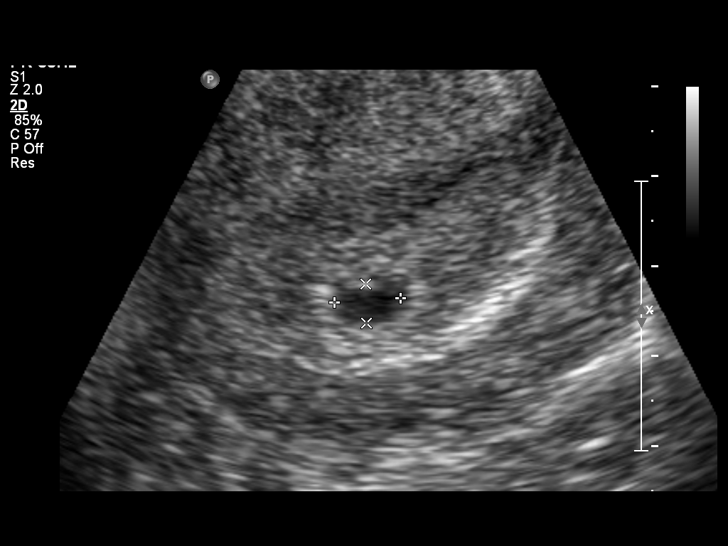
[im 15/32]
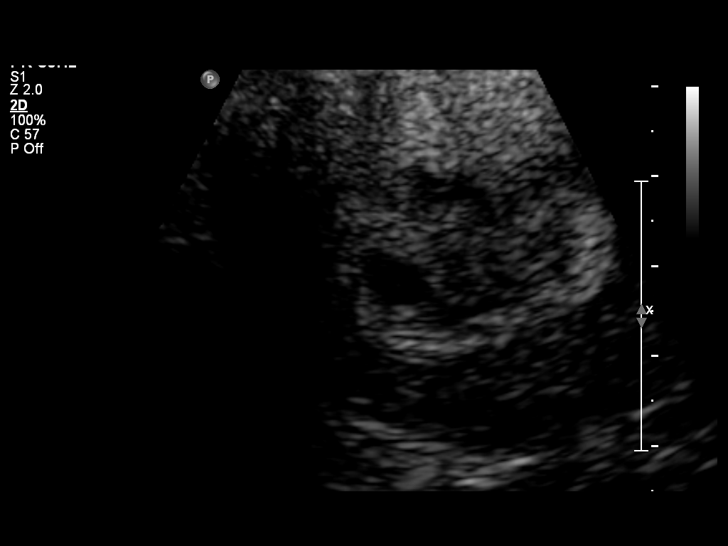
[im 18/32]
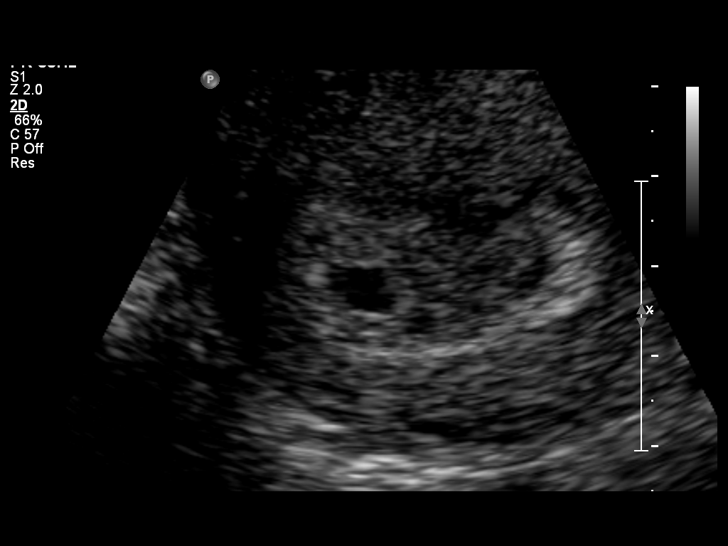
[im 20/32]
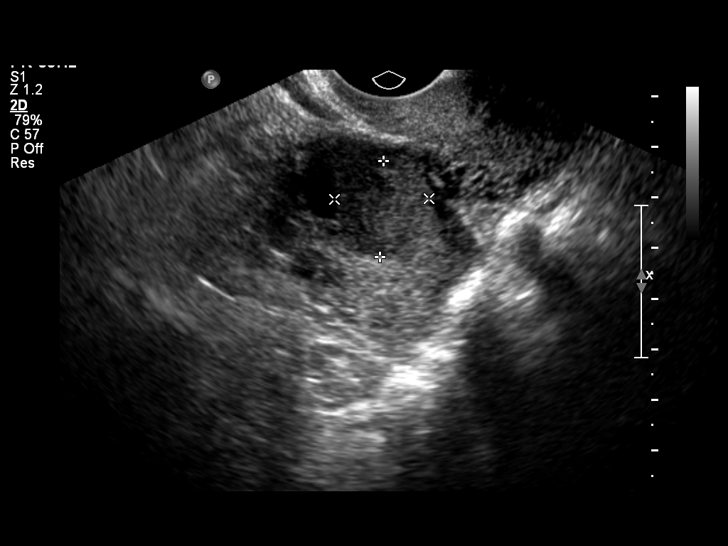
[im 22/32]
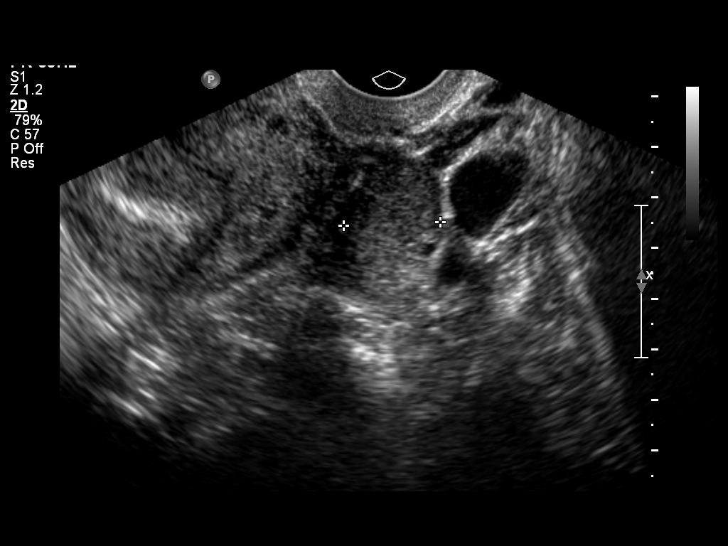
[im 25/32]
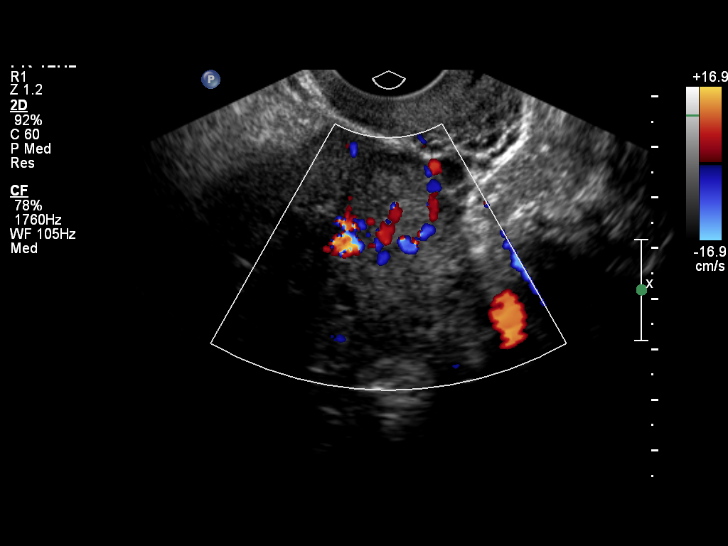
[im 27/32]
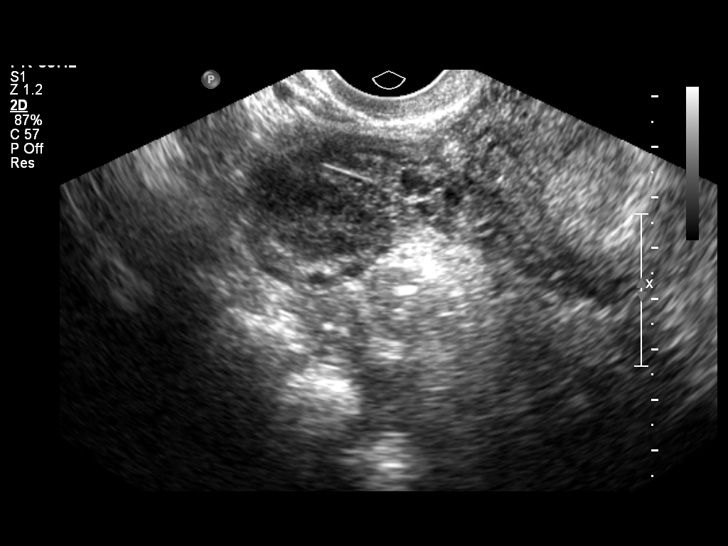
[im 29/32]
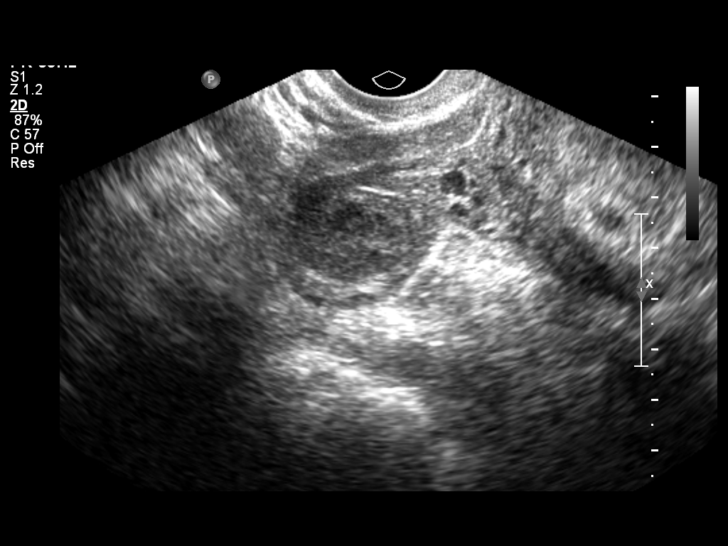
[im 32/32]
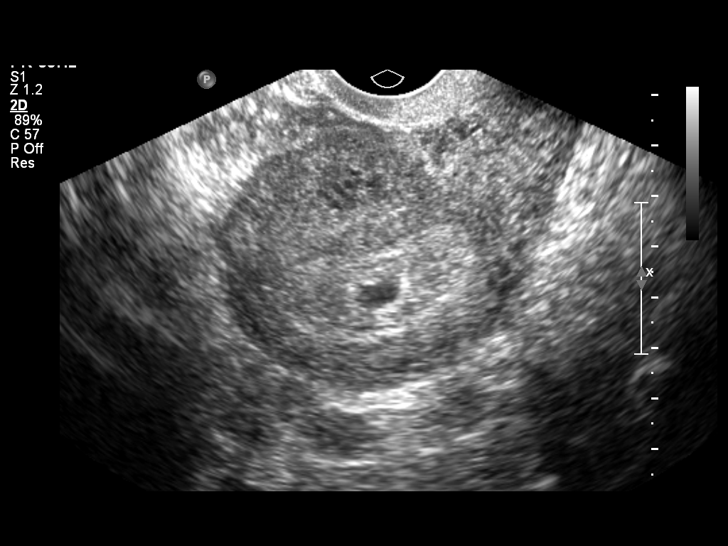

[14 of 28 positions shown; findings below may reference images not displayed]

IMPRESSION: See AS Obstetric US report.

## 2010-03-04 ENCOUNTER — Ambulatory Visit: Payer: Self-pay | Admitting: Obstetrics & Gynecology

## 2010-03-04 ENCOUNTER — Ambulatory Visit: Payer: Self-pay | Admitting: Family Medicine

## 2010-03-04 ENCOUNTER — Inpatient Hospital Stay (HOSPITAL_COMMUNITY): Admission: RE | Admit: 2010-03-04 | Discharge: 2010-03-04 | Payer: Self-pay | Admitting: Obstetrics & Gynecology

## 2010-03-04 IMAGING — US US OB TRANSVAGINAL
1 series · 14 of 28 positions shown · non-contrast
Comparison: none

OBSTETRICAL ULTRASOUND:
 This ultrasound exam was performed in the [HOSPITAL] Ultrasound Department.  The OB US report was generated in the AS system, and faxed to the ordering physician.  This report is also available in [HOSPITAL]?s [employer] and in [REDACTED] PACS.

[Series 1: us ob transvaginal · 0.18mm/px · 14 of 32 slices shown]
[im 2/32]
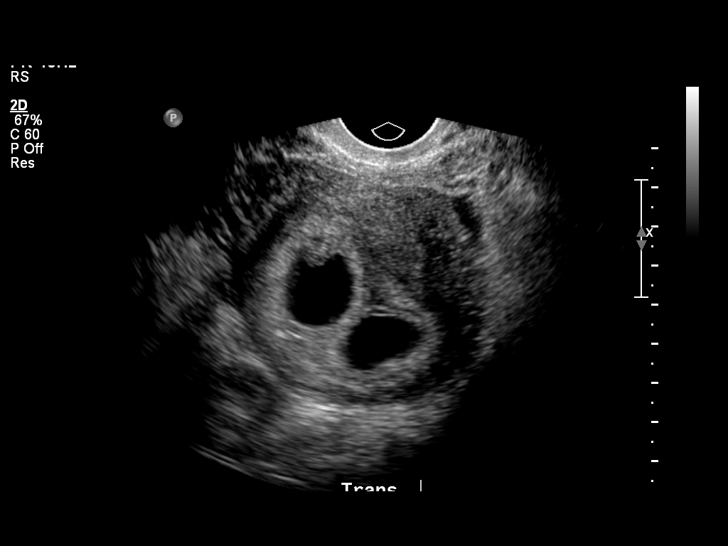
[im 4/32]
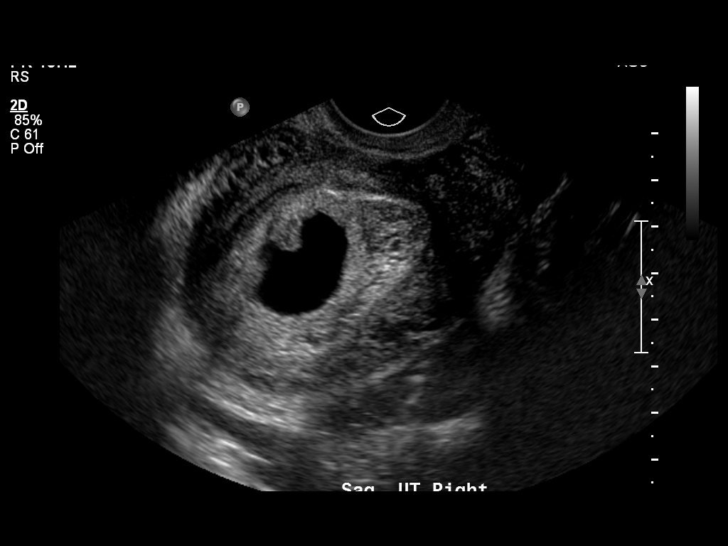
[im 6/32]
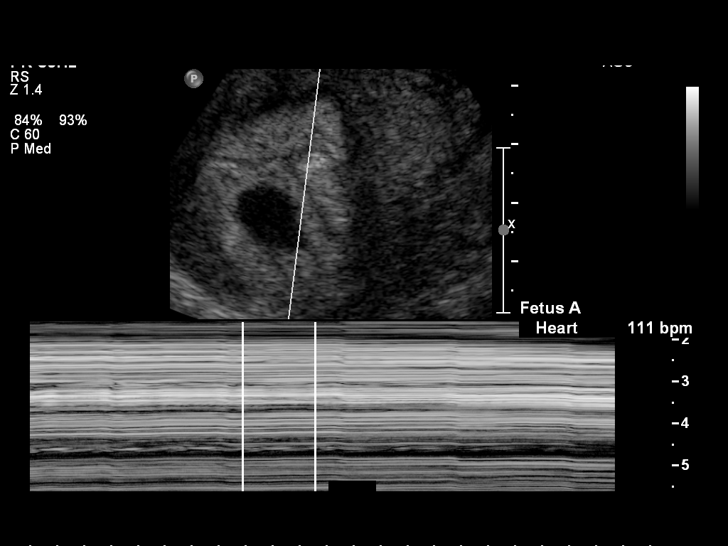
[im 9/32]
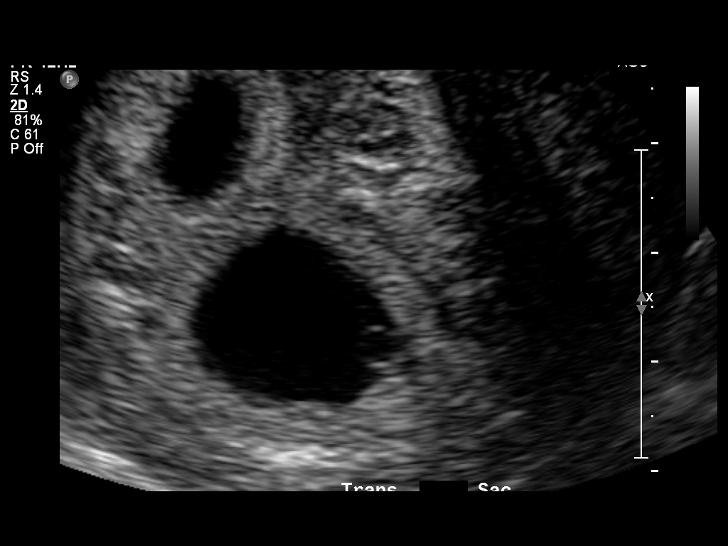
[im 11/32]
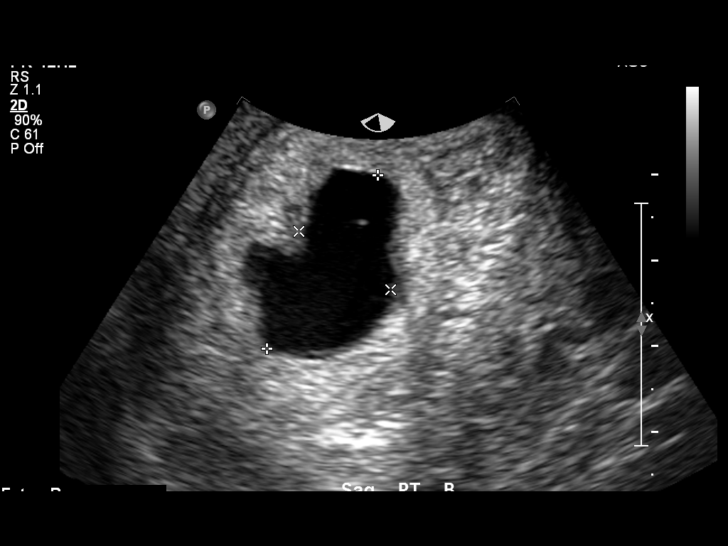
[im 13/32]
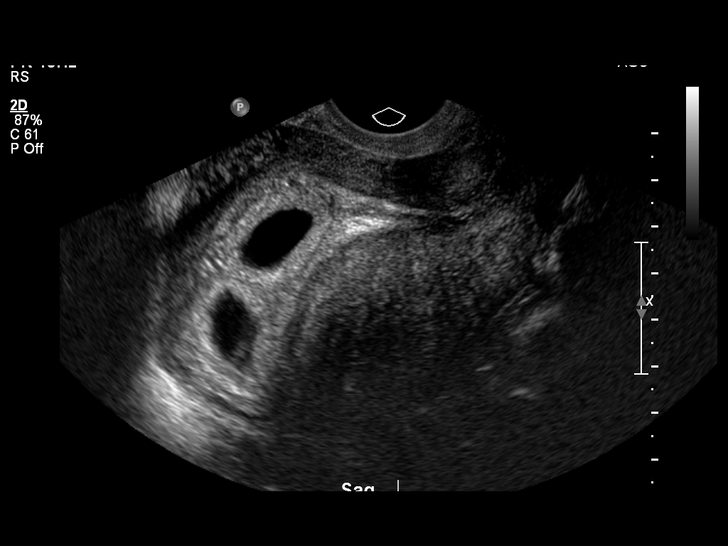
[im 15/32]
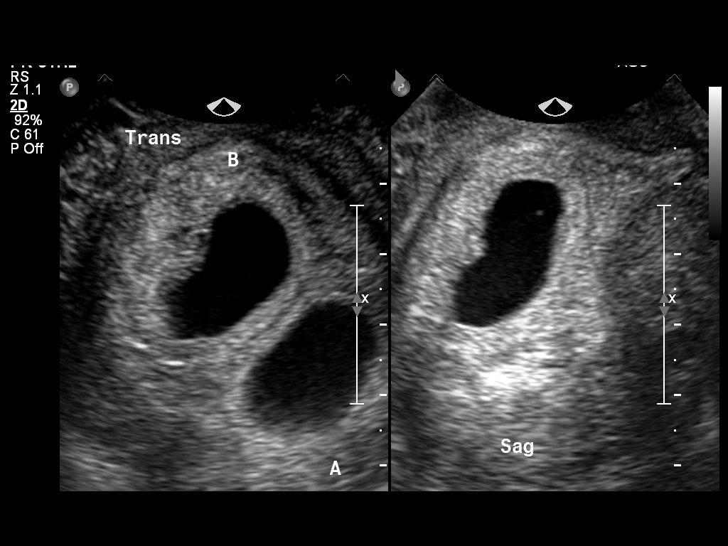
[im 18/32]
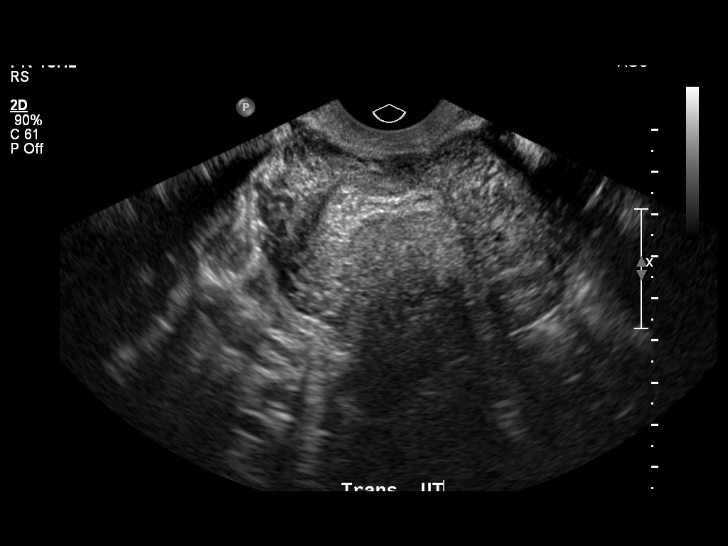
[im 20/32]
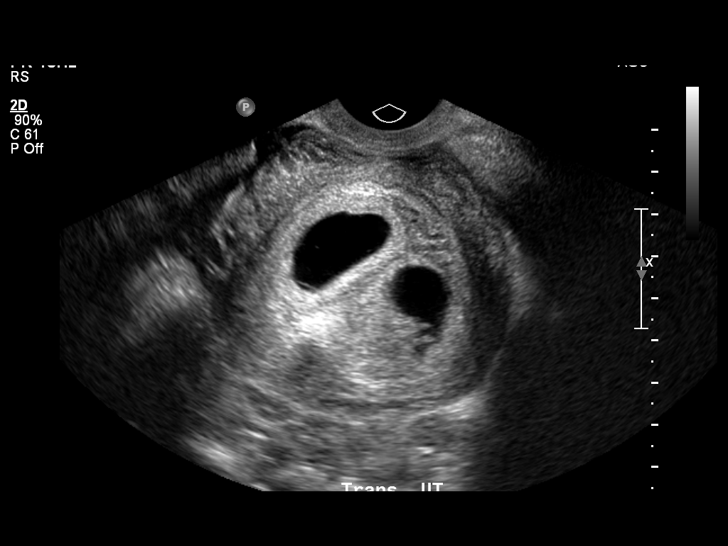
[im 22/32]
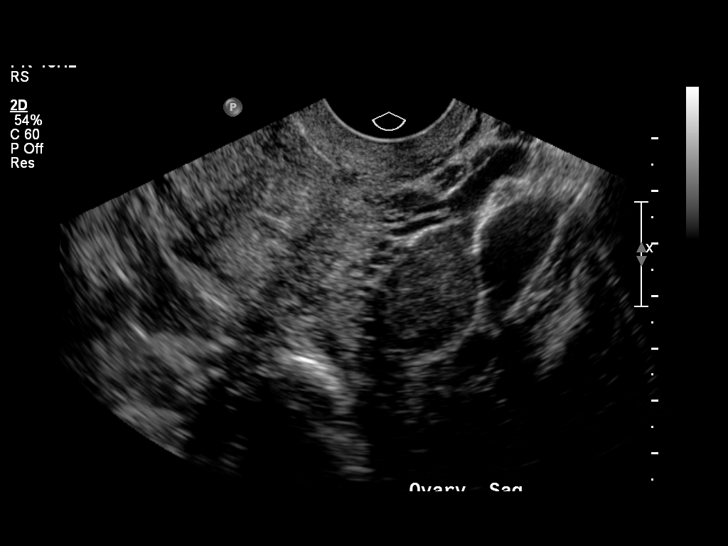
[im 25/32]
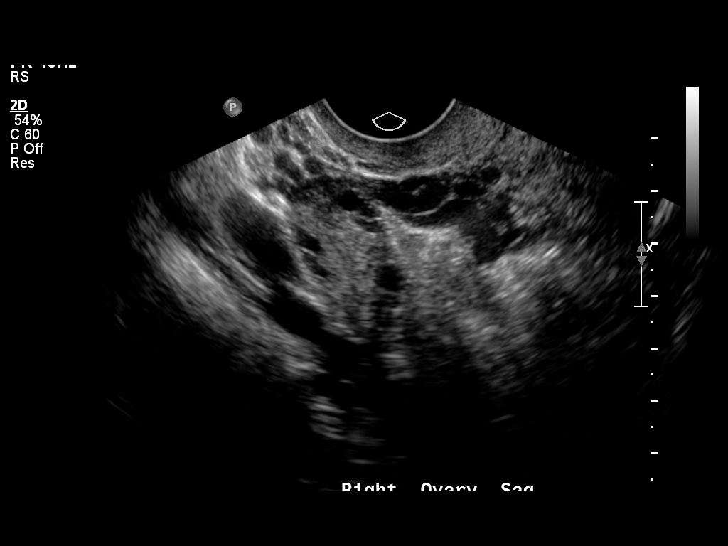
[im 27/32]
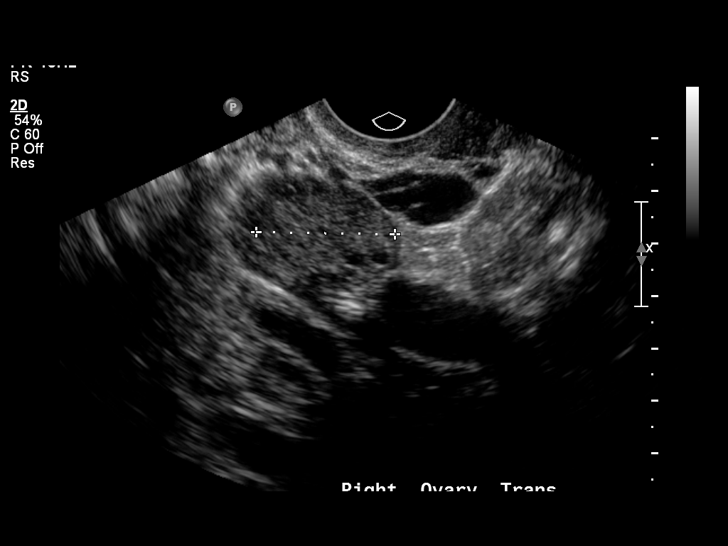
[im 29/32]
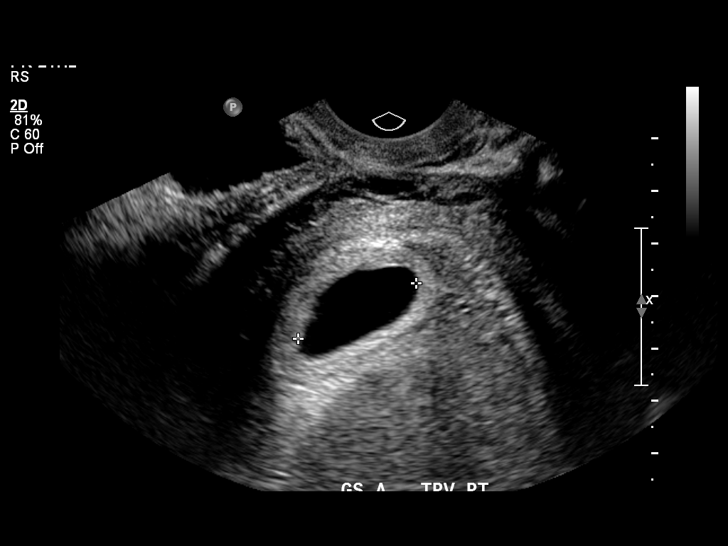
[im 32/32]
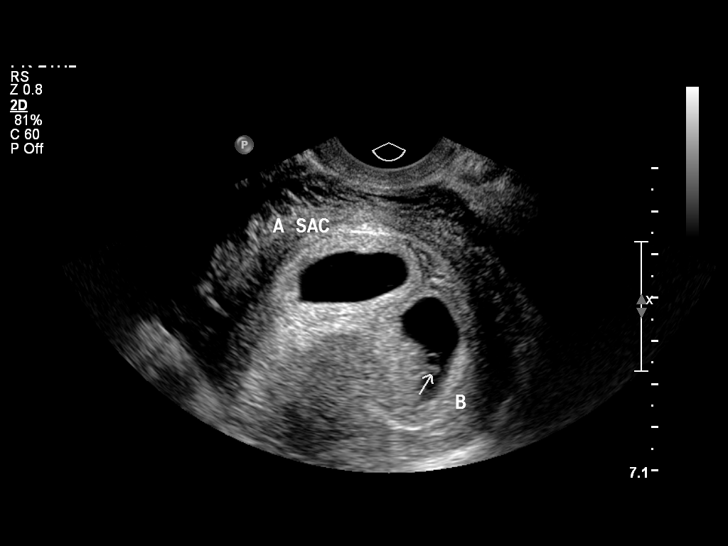

[14 of 28 positions shown; findings below may reference images not displayed]

IMPRESSION: See AS Obstetric US report.

## 2010-03-08 ENCOUNTER — Inpatient Hospital Stay (HOSPITAL_COMMUNITY): Admission: AD | Admit: 2010-03-08 | Discharge: 2010-03-08 | Payer: Self-pay | Admitting: Obstetrics & Gynecology

## 2010-03-08 ENCOUNTER — Ambulatory Visit: Payer: Self-pay | Admitting: Family

## 2010-03-08 IMAGING — US US OB TRANSVAGINAL
1 series · 13 of 28 positions shown · non-contrast
Comparison: Prior examination [DATE].

CLINICAL DATA: First trimester pregnancy with pelvic cramping.
Recent ultrasound demonstrated twin gestation with demise of one
embryo.

TRANSVAGINAL OB ULTRASOUND
TECHNIQUE: Transvaginal ultrasound was performed for evaluation of
the gestation as well as the maternal uterus and adnexal regions.

[Series 1: us ob transvaginal · 0.17mm/px · 13 of 35 slices shown]
[im 2/35]
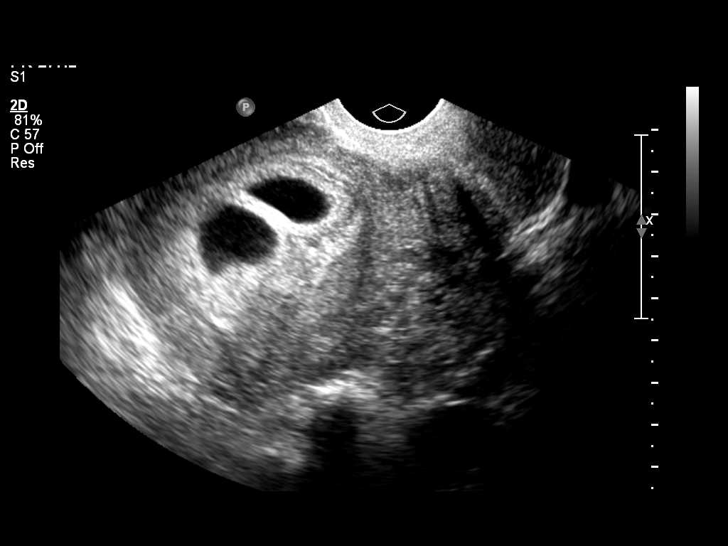
[im 4/35]
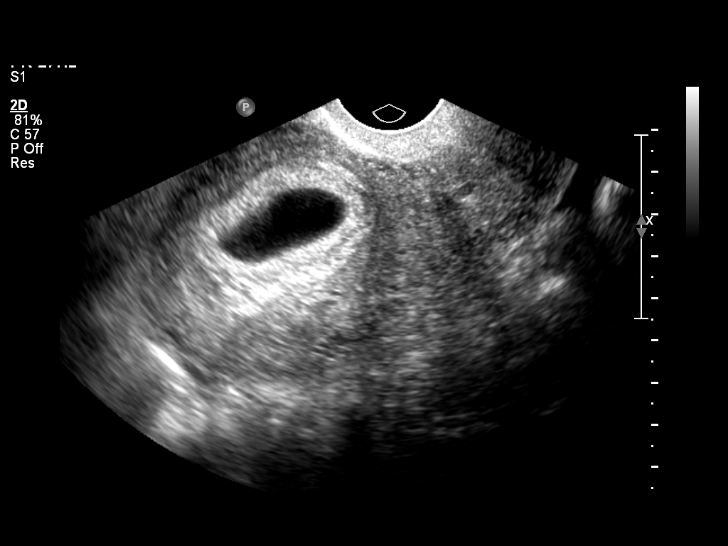
[im 7/35]
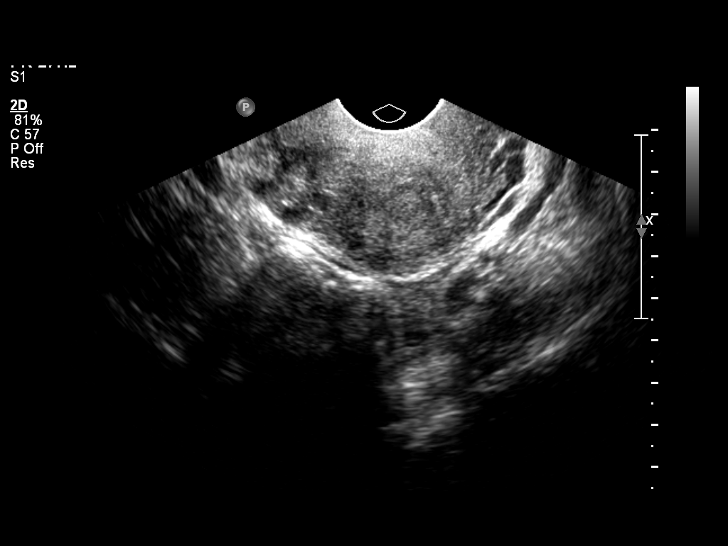
[im 9/35]
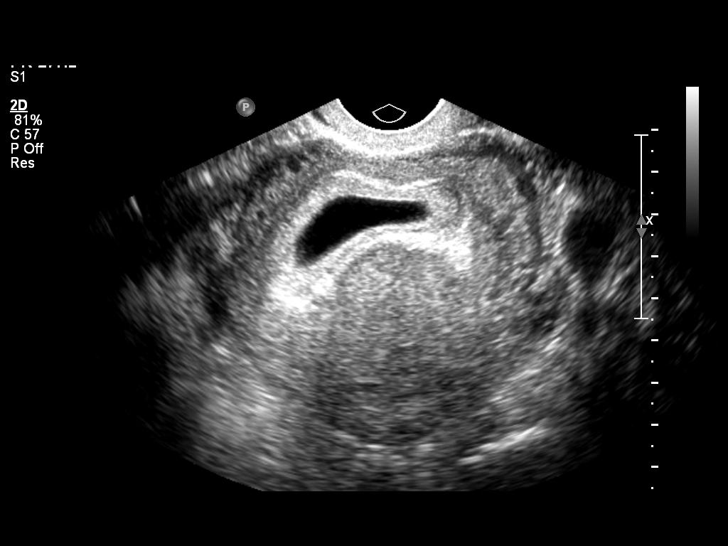
[im 12/35]
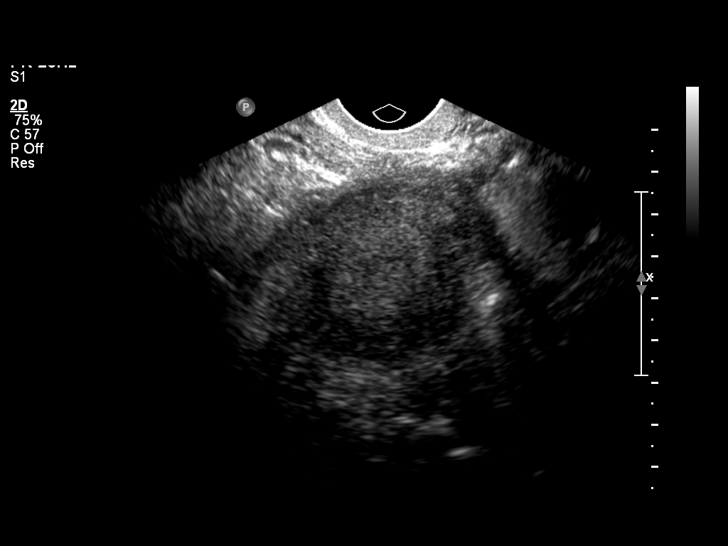
[im 14/35]
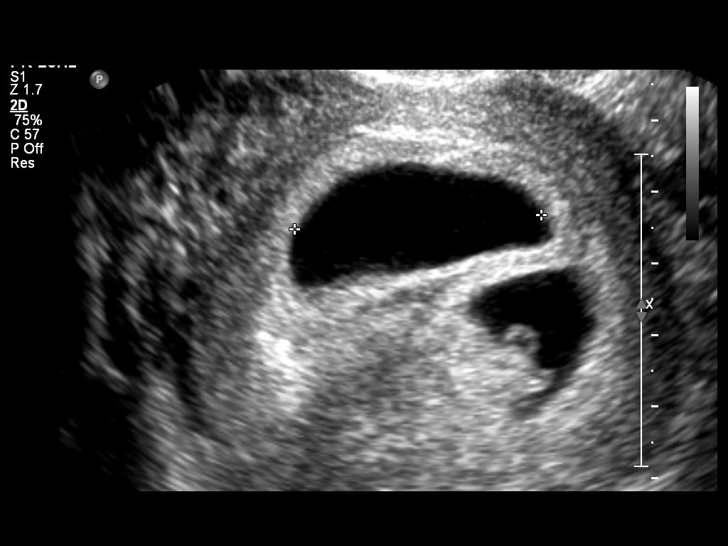
[im 18/35]
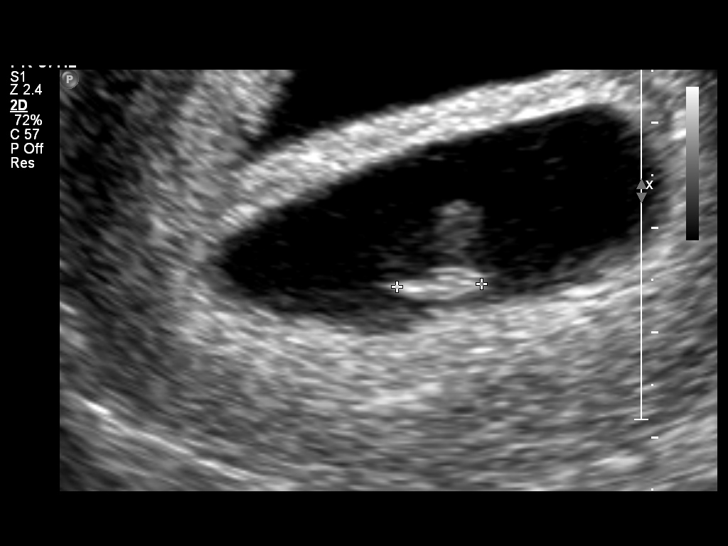
[im 21/35]
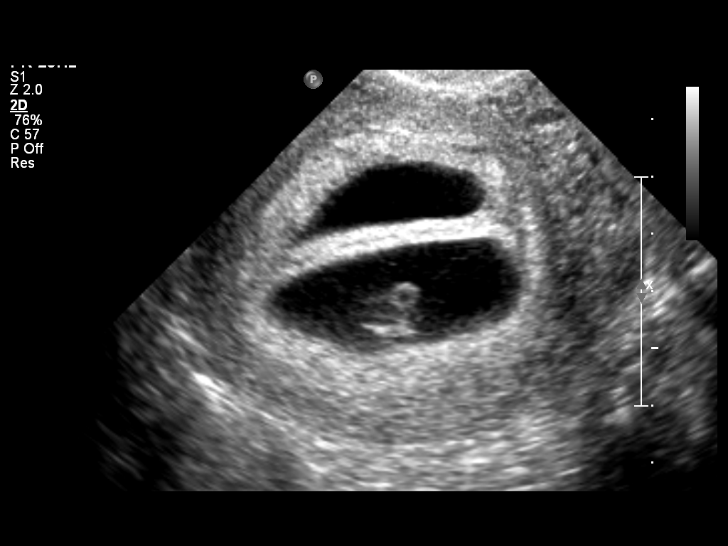
[im 23/35]
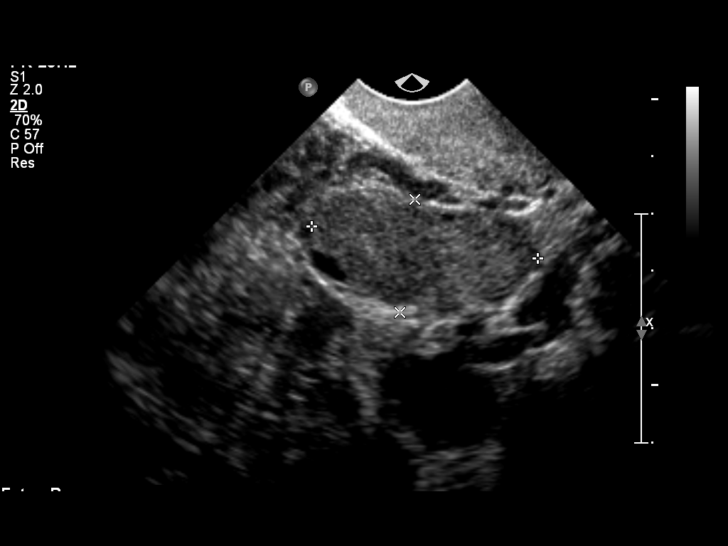
[im 26/35]
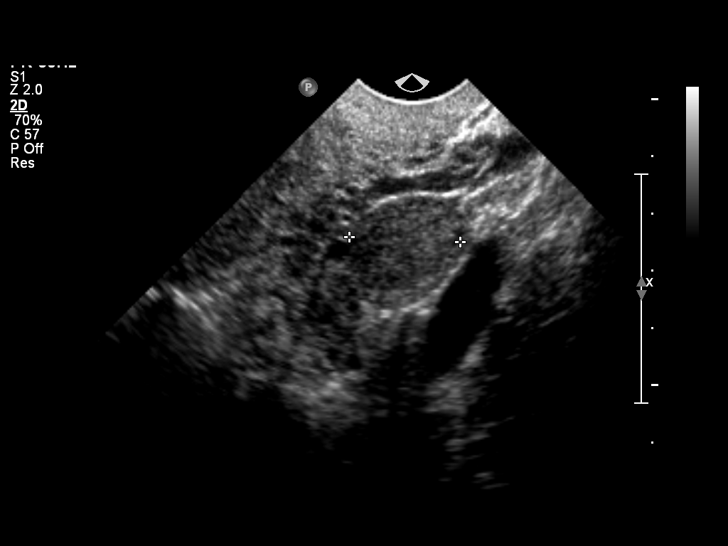
[im 28/35]
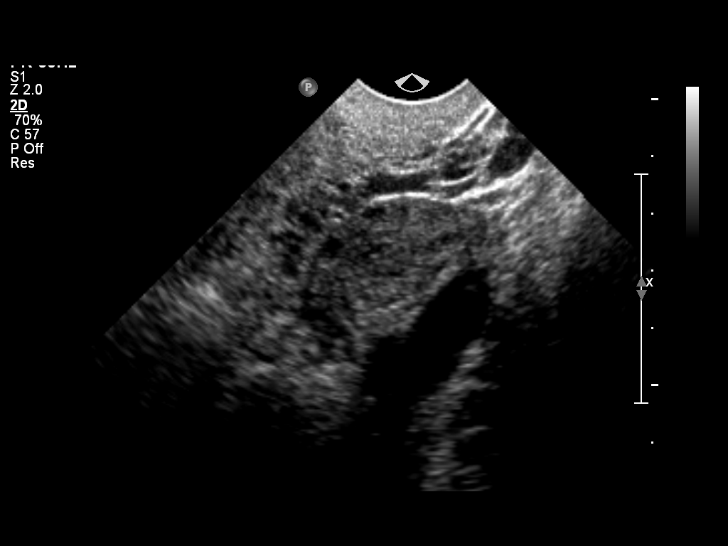
[im 31/35]
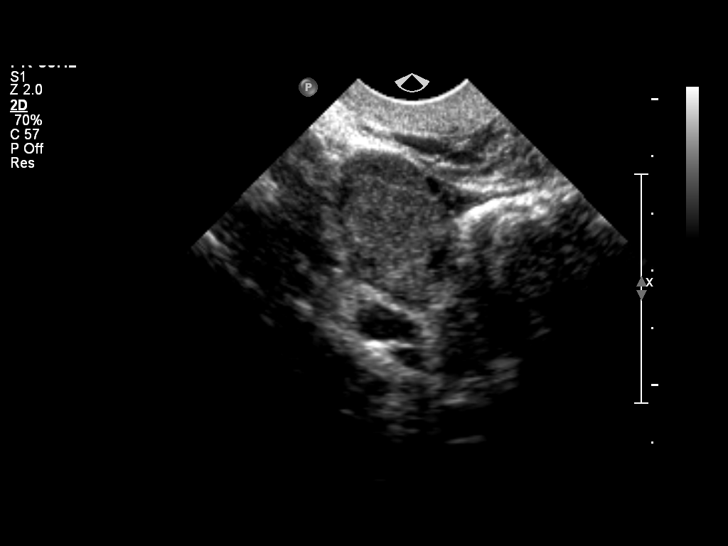
[im 33/35]
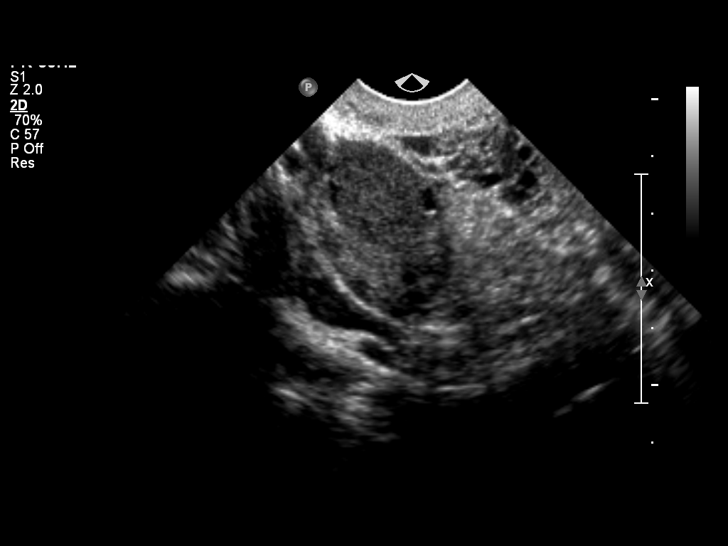

[13 of 28 positions shown; findings below may reference images not displayed]

FINDINGS: Study was performed on an call basis.  As demonstrated on
the prior examination, there are two intrauterine gestational sacs
with a configuration consistent with diamniotic dichorionic twin
gestation.  The empty anterior gestational sac has a mean sac
diameter of 2.9 cm.  No fetal pole is demonstrated in this
gestational sac.

Within the posterior gestational sac, there is an embryo (assigned
twin B) with a crown-rump length of 7.7 mm, corresponding with a
gestational age of 6 weeks 5 days.  This is concordant with prior
examination.  A yolk sac is visualized.  Fetal cardiac motion is
present at only 78 beats per minute.

No subchorionic hematoma is identified.  The maternal ovaries
appear normal.  There is no adnexal mass or free pelvic fluid.
IMPRESSION: 1.  Expected interval growth of twin B with best estimated
gestational age of 6 weeks 5 days.
2.  New abnormally low fetal cardiac motion in twin B is a poor
prognostic indicator.
3.  Stable anembryonic twin 20 gestation.

## 2010-03-11 ENCOUNTER — Encounter: Payer: Self-pay | Admitting: Family Medicine

## 2010-03-24 DIAGNOSIS — O360990 Maternal care for other rhesus isoimmunization, unspecified trimester, not applicable or unspecified: Secondary | ICD-10-CM | POA: Insufficient documentation

## 2010-04-19 ENCOUNTER — Inpatient Hospital Stay (HOSPITAL_COMMUNITY): Admission: AD | Admit: 2010-04-19 | Discharge: 2010-04-19 | Payer: Self-pay | Admitting: Obstetrics and Gynecology

## 2010-04-19 ENCOUNTER — Ambulatory Visit: Payer: Self-pay | Admitting: Obstetrics and Gynecology

## 2010-04-28 ENCOUNTER — Ambulatory Visit: Payer: Self-pay | Admitting: Family Medicine

## 2010-04-28 DIAGNOSIS — O30009 Twin pregnancy, unspecified number of placenta and unspecified number of amniotic sacs, unspecified trimester: Secondary | ICD-10-CM | POA: Insufficient documentation

## 2010-04-30 ENCOUNTER — Encounter: Payer: Self-pay | Admitting: Family Medicine

## 2010-05-05 ENCOUNTER — Telehealth: Payer: Self-pay | Admitting: *Deleted

## 2010-05-06 ENCOUNTER — Ambulatory Visit: Payer: Self-pay | Admitting: Obstetrics and Gynecology

## 2010-05-19 ENCOUNTER — Ambulatory Visit (HOSPITAL_COMMUNITY): Admission: RE | Admit: 2010-05-19 | Discharge: 2010-05-19 | Payer: Self-pay | Admitting: Family Medicine

## 2010-05-19 IMAGING — US US OB COMP +14 WK
1 series · 12 of 28 positions shown · non-contrast
Comparison: none

[Series 1: us ob comp +14 wk · 58 acquisitions, 12 frames shown]
[im 3/58]
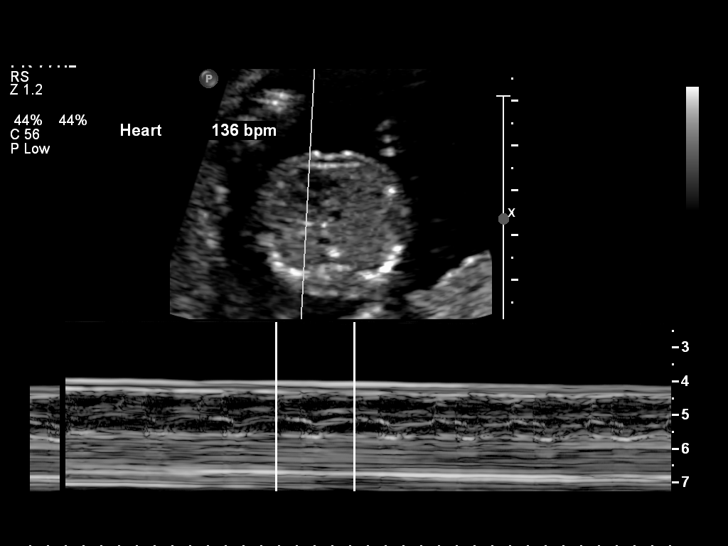
[im 7/58]
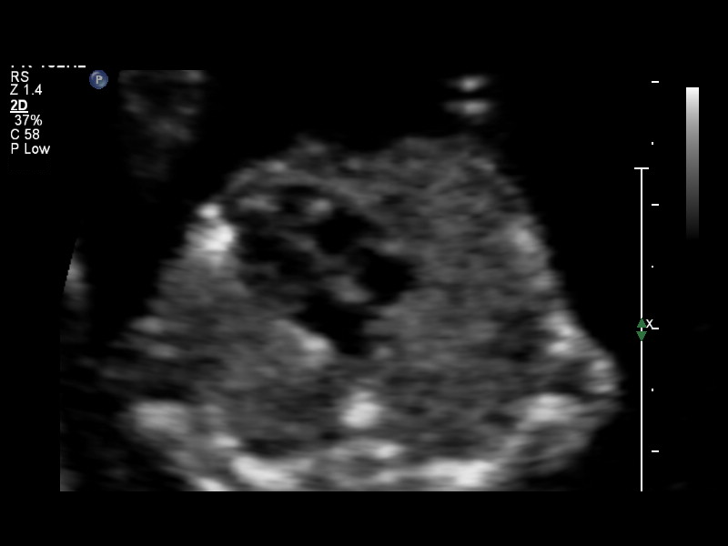
[im 11/58]
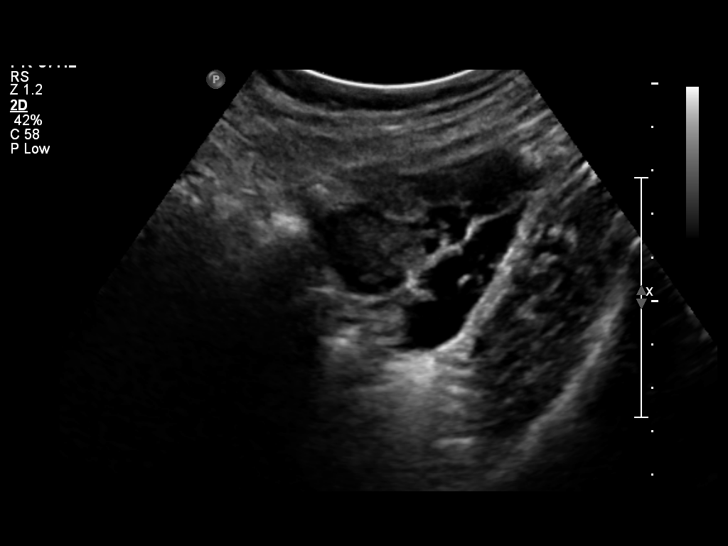
[im 17/58]
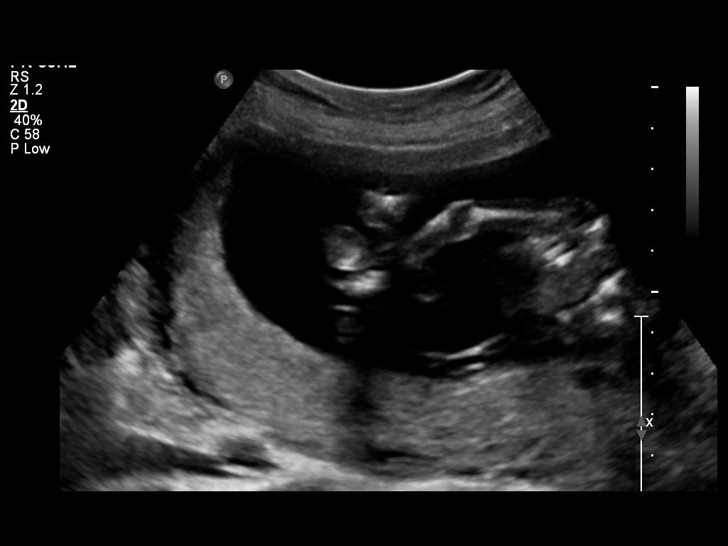
[im 22/58]
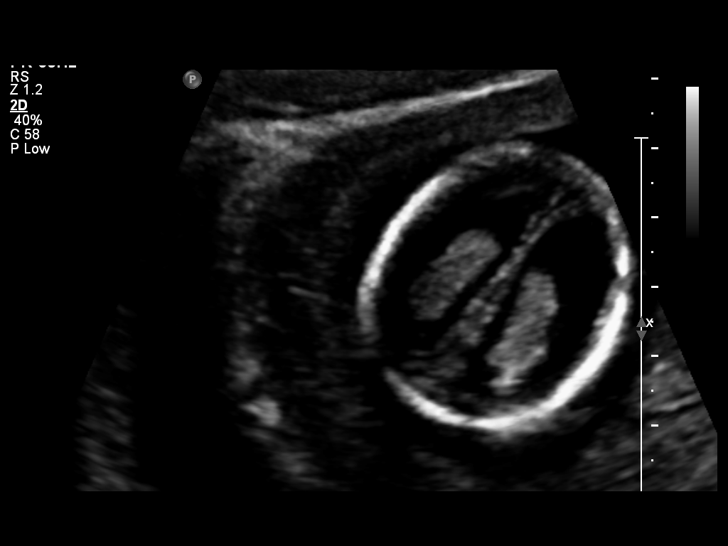
[im 26/58]
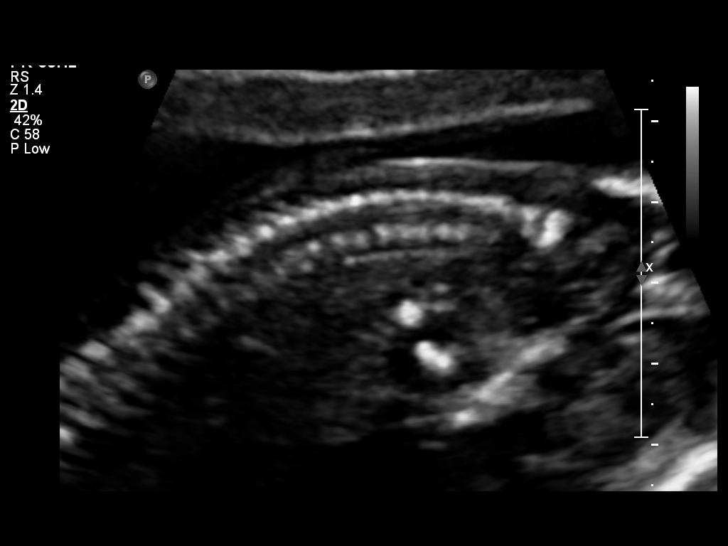
[im 32/58]
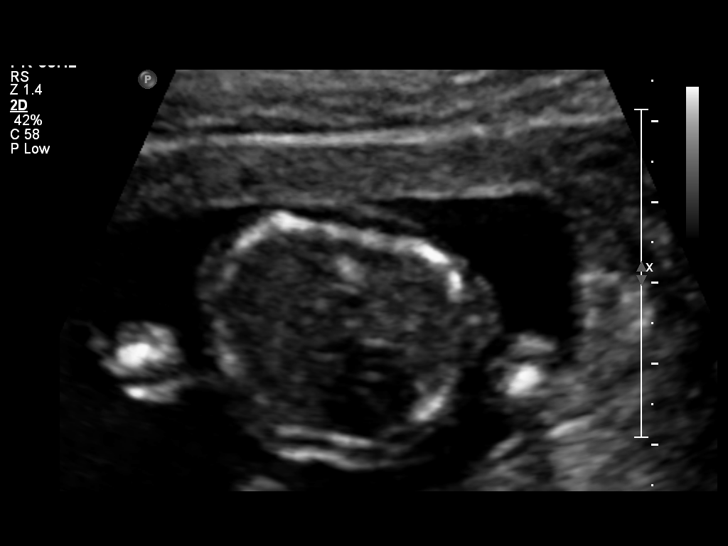
[im 36/58]
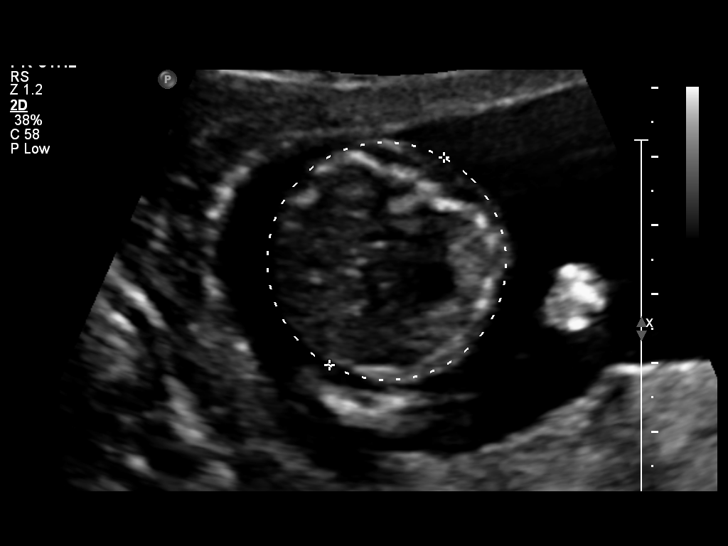
[im 41/58]
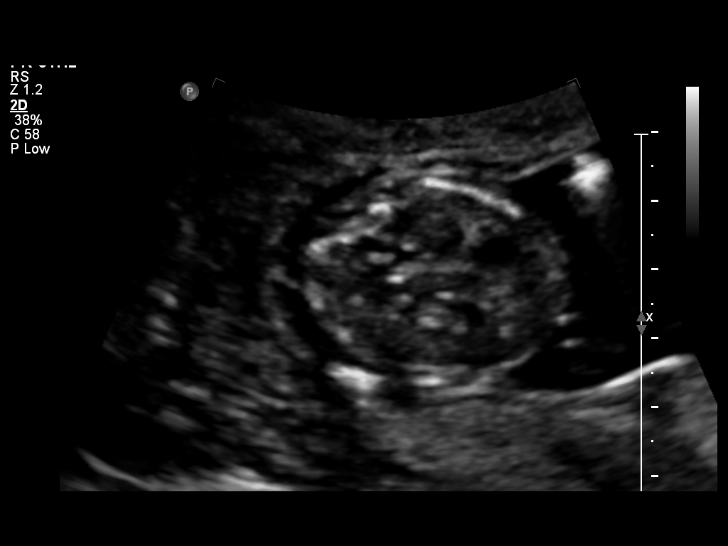
[im 47/58]
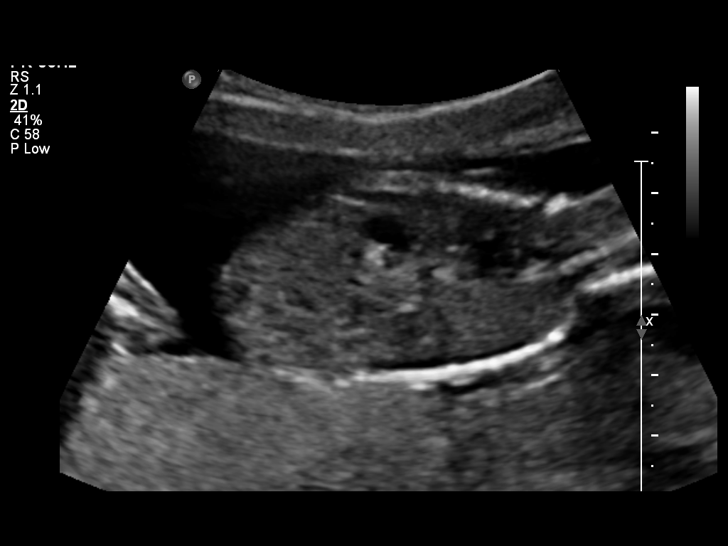
[im 51/58]
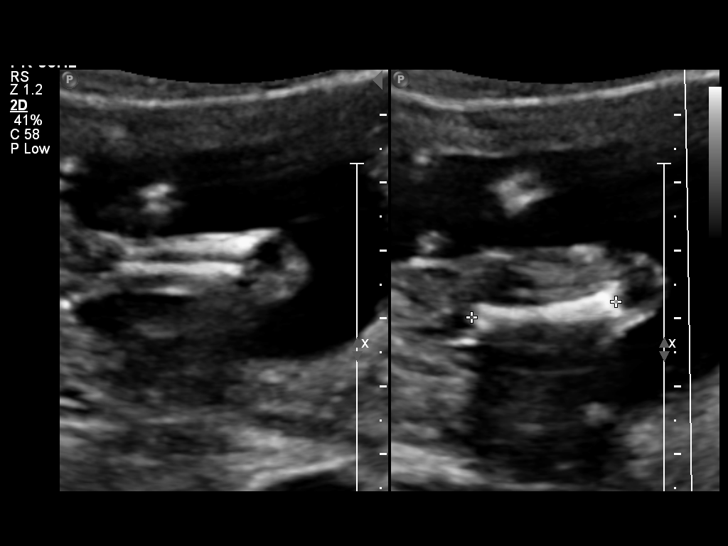
[im 55/58]
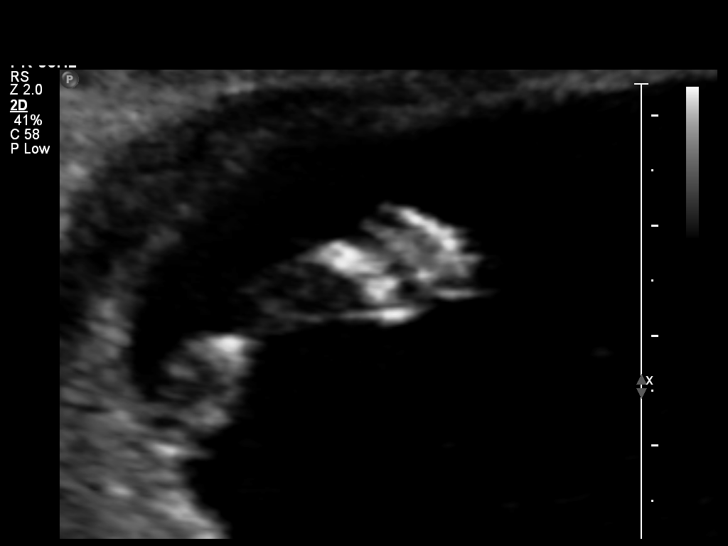

[12 of 28 positions shown; findings below may reference images not displayed]

OBSTETRICS REPORT
                      (Signed Final [DATE] [DATE])

 Order#:         _O
Procedures

 US OB COMP +14 WK                                     76805.1
Indications

 Basic anatomic survey
 Poor obstetric history: Previous preterm delivery     [2L]

Fetal Evaluation (Fetus B)

 Fetal Heart Rate:  136                          bpm
 Cardiac Activity:  Observed
 Presentation:      Cephalic
 Placenta:          Posterior, above cervical
                    os
 P. Cord            Visualized
 Insertion:

 Comment:    Previous twin IUGS no longer seen.

 Amniotic Fluid
 AFI FV:      Subjectively within normal limits
                                             Larg Pckt:     5.1  cm
Biometry (Fetus B)

 BPD:     34.5  mm     G. Age:  16w 5d                CI:        71.24   70 - 86
                                                      FL/HC:      17.1   14.6 -

 HC:     130.2  mm     G. Age:  16w 4d       36  %    HC/AC:      1.18   1.07 -

 AC:     110.6  mm     G. Age:  16w 6d       57  %    FL/BPD:
 FL:      22.3  mm     G. Age:  16w 5d       44  %    FL/AC:      20.2   20 - 24

 Est. FW:     169  gm      0 lb 6 oz     60  %
Gestational Age (Fetus B)

 LMP:           17w 5d        Date:  [DATE]                 EDD:   [DATE]
 U/S Today:     16w 5d                                        EDD:   [DATE]
 Best:          16w 5d     Det. By:  U/S C R L Fetus B        EDD:   [DATE]
                                     ([DATE])
Anatomy (Fetus B)

 Cranium:           Appears normal      Aortic Arch:       Basic anatomy
                                                           exam per order
 Fetal Cavum:       Appears normal      Ductal Arch:       Basic anatomy
                                                           exam per order
 Ventricles:        Appears normal      Diaphragm:         Basic anatomy
                                                           exam per order
 Choroid Plexus:    Appears normal      Stomach:           Appears normal
 Cerebellum:        Appears normal      Abdomen:           Appears normal
 Posterior Fossa:   Appears normal      Abdominal Wall:    Appears nml
                                                           (cord insert,
                                                           abd wall)
 Nuchal Fold:       Appears normal      Cord Vessels:      Appears normal
                    (neck, nuchal                          (3 vessel cord)
                    fold)
 Face:              Lips appear         Kidneys:           Appear normal
                    normal
 Heart:             Appears normal      Bladder:           Appears normal
                    (4 chamber &
                    axis)
 RVOT:              Basic anatomy       Spine:             Appears normal
                    exam per order
 LVOT:              Basic anatomy       Limbs:             Four extremities
                    exam per order                         seen

 Other:     Fetus appears to be a female. Technically difficult due to
            early GA.
Cervix Uterus Adnexa

 Cervical Length:    3.5      cm

 Cervix:       Normal appearance by transabdominal scan.

 Left Ovary:    Within normal limits.
 Right Ovary:   Within normal limits.
 Adnexa:     No abnormality visualized.
Impression

 Single living IUP with Assigned GA currently 16w 5d based
 on prior US.  Appropriate interval fetal growth.
 Previous early twin IUGS no longer visualized.
 No fetal anomalies seen involving visualized anatomy.
 Normal amniotic fluid volume. Normal cervical length.

## 2010-05-23 ENCOUNTER — Inpatient Hospital Stay (HOSPITAL_COMMUNITY)
Admission: AD | Admit: 2010-05-23 | Discharge: 2010-05-24 | Payer: Self-pay | Source: Home / Self Care | Admitting: Obstetrics & Gynecology

## 2010-05-24 ENCOUNTER — Ambulatory Visit: Payer: Self-pay | Admitting: Family Medicine

## 2010-05-28 ENCOUNTER — Telehealth: Payer: Self-pay | Admitting: Family Medicine

## 2010-06-02 ENCOUNTER — Ambulatory Visit: Payer: Self-pay | Admitting: Obstetrics and Gynecology

## 2010-06-10 ENCOUNTER — Encounter: Payer: Self-pay | Admitting: Family Medicine

## 2010-06-20 HISTORY — PX: TUBAL LIGATION: SHX77

## 2010-07-01 ENCOUNTER — Ambulatory Visit
Admission: RE | Admit: 2010-07-01 | Discharge: 2010-07-01 | Payer: Self-pay | Source: Home / Self Care | Attending: Obstetrics and Gynecology | Admitting: Obstetrics and Gynecology

## 2010-07-02 ENCOUNTER — Ambulatory Visit (HOSPITAL_COMMUNITY)
Admission: RE | Admit: 2010-07-02 | Discharge: 2010-07-02 | Payer: Self-pay | Source: Home / Self Care | Attending: Family Medicine | Admitting: Family Medicine

## 2010-07-05 LAB — POCT URINALYSIS DIPSTICK
Hgb urine dipstick: NEGATIVE
Nitrite: NEGATIVE
Protein, ur: 30 mg/dL — AB
Specific Gravity, Urine: 1.02 (ref 1.005–1.030)
Urine Glucose, Fasting: NEGATIVE mg/dL
Urobilinogen, UA: 0.2 mg/dL (ref 0.0–1.0)
pH: 7 (ref 5.0–8.0)

## 2010-07-08 ENCOUNTER — Ambulatory Visit
Admission: RE | Admit: 2010-07-08 | Discharge: 2010-07-08 | Payer: Self-pay | Source: Home / Self Care | Attending: Obstetrics and Gynecology | Admitting: Obstetrics and Gynecology

## 2010-07-11 ENCOUNTER — Encounter: Payer: Self-pay | Admitting: Obstetrics & Gynecology

## 2010-07-15 ENCOUNTER — Encounter: Payer: Self-pay | Admitting: Obstetrics and Gynecology

## 2010-07-15 ENCOUNTER — Ambulatory Visit
Admission: RE | Admit: 2010-07-15 | Discharge: 2010-07-15 | Payer: Self-pay | Source: Home / Self Care | Attending: Obstetrics and Gynecology | Admitting: Obstetrics and Gynecology

## 2010-07-15 LAB — POCT URINALYSIS DIPSTICK
Bilirubin Urine: NEGATIVE
Hgb urine dipstick: NEGATIVE
Ketones, ur: NEGATIVE mg/dL
Specific Gravity, Urine: 1.02 (ref 1.005–1.030)
Urine Glucose, Fasting: NEGATIVE mg/dL

## 2010-07-15 LAB — CONVERTED CEMR LAB
HCT: 29.2 % — ABNORMAL LOW (ref 36.0–46.0)
Hemoglobin: 9.7 g/dL — ABNORMAL LOW (ref 12.0–15.0)
MCV: 91.5 fL (ref 78.0–100.0)
RBC: 3.19 M/uL — ABNORMAL LOW (ref 3.87–5.11)
WBC: 7.9 10*3/uL (ref 4.0–10.5)

## 2010-07-20 NOTE — Progress Notes (Signed)
Summary: meds prob  Phone Note Call from Patient Call back at Home Phone [phone]   Caller: Patient Summary of Call: meds are supposed to go to [facility] [address] Initial call taken by: De Nurse,  July 21, 2009 2:28 PM  Follow-up for Phone Call       Follow-up by: Golden Circle RN,  July 21, 2009 2:29 PM    Prescriptions: AZITHROMYCIN 250 MG TABS (AZITHROMYCIN) take 2 tabs on day 1, then 1 tab daily x4 days  #6 tabs x 0   Entered by:   Golden Circle RN   Authorized by:   Doree Albee MD   Signed by:   Golden Circle RN on 07/21/2009   Method used:   Electronically to        [facility] [phone]* (retail)       [address]       [place], [place]  [address]       Ph: [phone]       Fax: [phone]   RxID:   [id number]

## 2010-07-20 NOTE — Assessment & Plan Note (Signed)
Summary: went to ed for abd cramping/tlb   Vital Signs:  Patient profile:   30 year old female Height:      61 inches Weight:      141 pounds Pulse rate:   80 / minute BP sitting:   125 / 73  (left arm) Cuff size:   regular  Vitals Entered By: Tessie Fass CMA (March 04, 2010 11:30 AM) CC: ER f/u Is Patient Diabetic? No   Primary Care Provider:  . [facility]  CC:  ER f/u.  History of Present Illness: 30 y/o [id number] @ ---- wga present for ED f/u  30 y/o 2 wks overdue 30 y/o 80 wga 30 y/o 65 wga All pregnancies followed by health dept.   Miscarriages: about 5 wga.  2004-2006  ER: 02/18/10 for abd cramping.  Was Dx with pregnancy 02/25/10: more cramping.   Still cramping in lower abd.  cramping comes and goes.  sharp movements makes it worse.   no vaginal bleeding.  Tylenol does not seem to help.   +nausea.  some vomiting, not daily, certain foods makes it worse ([employer]).  Pain seems mildly relieved by eating.  But nausea is preventing her from eating.  +heartburn, but this does not feel like that.  She follows GERD guideline.   Most bothersome complaint is nausea.  She has not been doing anything strenous.    +Migraine history.  Used to take Imitrex, but stopped years ago.  Before she found out she was pregnant she took 6 Aleves a day, but has stopped doing that.  Now having same type of migraine.   +sinusitis  Habits & Providers  Alcohol-Tobacco-Diet     Tobacco Status: current     Tobacco Counseling: to quit use of tobacco products     Cigarette Packs/Day: 0.25  Current Medications (verified): 1)  Prenavite Multiple Vitamin 28-0.8 Mg Tabs (Prenatal Vit-Fe Fumarate-Fa) .Marland Kitchen.. 1 Tab By Mouth Daily 2)  Ranitidine Hcl 150 Mg Caps (Ranitidine Hcl) .Marland Kitchen.. 1 Cap Two Times A Day 30 Minutes Before Meals For Heartburn. 3)  Unisom 25 Mg Tabs (Doxylamine Succinate (Sleep)) .Marland Kitchen.. 1 Tab Three Times A Day For Nausea. May Cause Drowsiness 4)  Vitamin B-6 25 Mg Tabs (Pyridoxine  Hcl) .Marland Kitchen.. 1 Tab Every 6 Hours As Needed Nausea. Take With Unisom 5)  Amoxicillin 500 Mg Caps (Amoxicillin) .Marland Kitchen.. 1 Tab By Mouth Two Times A Day X 7 Days 6)  Altamist Spray 0.65 % Soln (Saline) .... 2 Sprays in Each Nostril Daily  Allergies (verified): 1)  ! Asa 2)  ! Morphine 3)  ! 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   Lungs:  Normal respiratory effort, chest expands symmetrically. Lungs are clear to auscultation, no crackles or wheezes. Heart:  Normal rate and regular rhythm. S1 and S2 normal without gallop, murmur,  click, rub or other extra sounds. Abdomen:  Bowel sounds positive,abdomen soft and non-tender without masses, organomegaly or hernias noted. Pulses:  R and L carotid,radial,femoral,dorsalis pedis pulses are full and equal bilaterally Extremities:  No clubbing, cyanosis, edema, or deformity noted with normal full range of motion of all joints.   Neurologic:  No cranial nerve deficits noted. Station and gait are normal. Plantar reflexes are down-going bilaterally. DTRs are symmetrical throughout. Sensory, motor and coordinative functions appear intact. Cervical Nodes:  No lymphadenopathy noted Inguinal Nodes:  No  significant adenopathy   Impression & Recommendations:  Problem # 1:  ABDOMINAL CRAMPS (ICD-789.00) Assessment New Etiology unclear.  Does not seem to bother pt too badly right now.  Abd exam benign.  She has had normal ultrasounds.  Will monitor for now.  Ok to take tylenol.  ?Reflux causing cramps?  Not likely, but will rx ranitadine for relieve.  Can her nausea be causing cramping?  Rx unisom and vitamin B6 for nausea.   Orders: [facility]- Est  Level 4 ([address])  Problem # 2:  SINUSITIS (ICD-473.9) Assessment: New Symptoms present for more than 3 wks.  Will treat with Amx for 7 days.  Advised saline nasal spray (safe for pregnancy) and pt can try Neti Pot.   The following medications were removed from the medication list:    Fluticasone Propionate 50 Mcg/act Susp (Fluticasone propionate) .Marland Kitchen... 2 sprays each nostril daily    Amoxicillin 500 Mg Caps (Amoxicillin) .Marland Kitchen... 1 tab by mouth three times a day x 10 days    Sudafed 30 Mg Tabs (Pseudoephedrine hcl) .Marland Kitchen... 1 tab by mouth every 4 hours as needed for congestion Her updated medication list for this problem includes:    Amoxicillin 500 Mg Caps (Amoxicillin) .Marland Kitchen... 1 tab by mouth two times a day x 7 days    Altamist Spray 0.65 % Soln (Saline) .Marland Kitchen... 2 sprays in each nostril daily  Orders: [facility]- Est  Level 4 ([address])  Problem # 3:  PREGNANCY, MULTIGRAVIDA (ICD-V22.1) Assessment: New [id number] Pt is 7 wga by u/s from 9/8.  She is scheduled for another u/s today as previous one did not show fetal pole.  Will get prenatal labs today.  Pt to rtc for prenatal appt.  PNV rx.   Orders: [facility]- Est  Level 4 ([address])  Complete Medication List: 1)  Prenavite Multiple Vitamin 28-0.8 Mg Tabs (Prenatal vit-fe fumarate-fa) .Marland Kitchen.. 1 tab by mouth daily 2)  Ranitidine Hcl 150 Mg Caps (Ranitidine hcl) .Marland Kitchen.. 1 cap two times a day 30 minutes before meals for heartburn. 3)  Unisom 25 Mg Tabs (Doxylamine succinate (sleep)) .Marland Kitchen.. 1 tab three times a day for nausea. may cause  drowsiness 4)  Vitamin B-6 25 Mg Tabs (Pyridoxine hcl) .Marland Kitchen.. 1 tab every 6 hours as needed nausea. take with unisom 5)  Amoxicillin 500 Mg Caps (Amoxicillin) .Marland Kitchen.. 1 tab by mouth two times a day x 7 days 6)  Altamist Spray 0.65 % Soln (Saline) .... 2 sprays in each nostril daily  Other Orders: Prenatal-[facility] ([address]) HIV-[facility] ([id number]) Sickle Cell Scr-[facility] (84[address]-44010) Urine Culture-[facility] ([id number])  Patient Instructions: 1)  Please schedule a follow-up appointment for new ob. 2)    Prescriptions: ALTAMIST SPRAY 0.65 % SOLN (SALINE) 2 sprays in each nostril daily  #1 x 3   Entered and Authorized by:   Angeline Slim MD   Signed by:   Angeline Slim MD on 03/04/2010   Method used:   Electronically to  [facility]  [address] [phone]* (retail)       [address]       [place], [place]  [address]       Ph: [phone]       Fax: [phone]   RxID:   [id number] AMOXICILLIN 500 MG CAPS (AMOXICILLIN) 1 tab by mouth two times a day x 7 days  #14 x 0   Entered and Authorized by:   Angeline Slim MD   Signed by:   Angeline Slim MD on 03/04/2010   Method used:   Electronically to        [facility] [phone]* (retail)       [address]       [place], [place]  [address]       Ph: [phone]       Fax: [phone]   RxID:   [id number] VITAMIN B-6 25 MG TABS (PYRIDOXINE HCL) 1 tab every 6 hours as needed nausea. take with unisom  #120 x 3   Entered and Authorized by:   Angeline Slim MD   Signed by:   Angeline Slim MD on 03/04/2010   Method used:   Electronically to        [facility] [phone]* (retail)       [address]       [place], [place]  [address]       Ph: [phone]       Fax: [phone]   RxID:   [id number] UNISOM 25 MG TABS (DOXYLAMINE SUCCINATE (SLEEP)) 1 tab three times a day for nausea. May cause drowsiness  #90 x 3   Entered and Authorized by:   Angeline Slim MD   Signed by:   Angeline Slim MD on 03/04/2010   Method used:   Electronically to        [facility] [phone]*  (retail)       [address]       [place], [place]  [address]       Ph: [phone]       Fax: [phone]   RxID:   [id number] RANITIDINE HCL 150 MG CAPS (RANITIDINE HCL) 1 cap two times a day 30 minutes before meals for heartburn.  #60 x 6   Entered and Authorized by:   Angeline Slim MD   Signed by:   Angeline Slim MD on 03/04/2010   Method used:   Electronically to        [facility] [phone]* (retail)       [address]       [place], [place]  [address]       Ph: [phone]       Fax: [phone]   RxID:   [id number] PRENAVITE MULTIPLE VITAMIN 28-0.8 MG TABS (PRENATAL VIT-FE FUMARATE-FA) 1 tab by mouth daily  #30 x 11   Entered and Authorized by:   Angeline Slim MD   Signed by:   Angeline Slim MD on 03/04/2010   Method used:   Electronically to        [facility] [phone]* (retail)       [address]       [place], [place]  [address]       Ph: [phone]       Fax: [phone]   RxID:   [id number]    Flowsheet View for Follow-up Visit    Weight:     141    Blood  pressure:   125 / 73    Smoking PPD:   0.25   OB Initial Intake Information    Race: Black    Marital status: Single

## 2010-07-20 NOTE — Assessment & Plan Note (Signed)
Summary: cold & congestion,tcb   Vital Signs:  Patient profile:   30 year old female Weight:      145 pounds Temp:     98.0 degrees F oral Pulse rate:   71 / minute BP sitting:   134 / 79  (left arm) Cuff size:   regular  Vitals Entered By: Tessie Fass CMA (July 21, 2009 9:22 AM) CC: cough, congestion, chills and vomiting up yellow mucus with blood. Pain Assessment Patient in pain? yes     Location: lower back, head Intensity: 6   Primary Care Provider:  Ardeen Garland  MD  CC:  cough, congestion, and chills and vomiting up yellow mucus with blood.Marland Kitchen  History of Present Illness: 78 YOF w/ 2 week hx/o cough, rhinorrea, fever, pleuritic CP. Pt states sxs initially began as minor cough and then rapidly progressed to developing fever. Pt reports fever up to 102 at home.  Cough was initally dry in nature then progessed to be productive of whitish sputum in setting of worsening rhinorrhea. Pt states fever and rhinorrhea have resolved. However cough and pleuritic fever have persisited. Pt now SOB w/ exertion and significant pleuritic CP w/ deep inspiration and prolonged coughing. Pt denies any sick contacts. Mom reports 2 bouts of pust-tussive emesis w/in last 24 hours of presentation w/ significant wretching per pt. Last emesis w/ blood streaked mucus in setting of prolonged emesis episode (>5 min). Of note pt is smoker. Pt reports smoking 4-5 cigs/day while symptomatic (4-5 cigs/day baseline).  Habits & Providers  Alcohol-Tobacco-Diet     Tobacco Status: current     Cigarette Packs/Day: <0.25  Allergies: 1)  ! Asa 2)  ! Morphine 3)  ! * Dilaudid  Social History: Packs/Day:  <0.25  Physical Exam  General:  alert and uncomfortable-appearing.   Head:  normocephalic and atraumatic.   Eyes:  vision grossly intact.   Ears:  TMs clear bilaterally  Nose:  no external deformity.   Mouth:  good dentition. No oropharyngeal erythema Neck:  supple and full ROM.  No cervical  LAD Chest Wall:  + pain w/ deep inspiration Lungs:  +Decreased breath sounds in RML, faint crackles in bases.  Heart:  normal rate, regular rhythm, no murmur, no gallop, and no rub.   Abdomen:  soft, non-tender, and normal bowel sounds.     Impression & Recommendations:  Problem # 1:  SHORTNESS OF BREATH (ICD-786.05) Likely due to secondary bacterial PNA in setting of resolving viral URI. Exam consistent w/ possible RML PNA. Pt also at increased of PNA in setting of + smoking hx.Discussed case w/ preceptor. Will start pt on azithromycin (z-pack) for treatment of CAP. Will followup w/ pt in 1-2 weeks for appropriate followup of clinical course. Will hold on CXR.  Overall clinical exam in addition to pulse oximetry reassuring for pt not needing higher level of management. Blood tinged sputum self resolving per pt. Likely secondary to prolonged cough and emesis/wretching. Clinically stable. Pt advised to return if blood tinged sputum/emesis becomes frank in nature. Pt also advised to detrement of smoking, ESPECIALLY w/ ongonig respiratory. Pt ready to proceed in terms of smoking cessation.  Orders: Pulse Oximetry- [facility] (94760) [facility]- Est  Level 4 ([address])  Complete Medication List: 1)  Ambien 5 Mg Tabs (Zolpidem tartrate) .Marland Kitchen.. 1 tab by mouth at bedtime as needed sleep 2)  Fluticasone Propionate 50 Mcg/act Susp (Fluticasone propionate) .... 2 sprays each nostril daily 3)  Amoxicillin 500 Mg Caps (Amoxicillin) .Marland KitchenMarland KitchenMarland Kitchen 1  tab by mouth three times a day x 10 days 4)  Sudafed 30 Mg Tabs (Pseudoephedrine hcl) .Marland Kitchen.. 1 tab by mouth every 4 hours as needed for congestion 5)  Zantac 150 Maximum Strength 150 Mg Tabs (Ranitidine hcl) .Marland Kitchen.. 1 tab by mouth two times a day for reflux 6)  Azithromycin 250 Mg Tabs (Azithromycin) .... Take 2 tabs on day 1, then 1 tab daily x4 days Prescriptions: AZITHROMYCIN 250 MG TABS (AZITHROMYCIN) take 2 tabs on day 1, then 1 tab daily x4 days  #6 tabs x 0   Entered and Authorized by:    Doree Albee MD   Signed by:   Doree Albee MD on 07/21/2009   Method used:   Electronically to        [facility]  [address] [phone]* (retail)       [address]       [place], [place]  [address]       Ph: [phone] or [phone]       Fax: [phone]   RxID:   [id number]

## 2010-07-20 NOTE — Miscellaneous (Signed)
Summary: PA request  Clinical Lists Changes PA required for Pantoprazole. form placed in MD box for completion. Theresia Lo RN  April 30, 2010 11:19 AM  PA completed and faxed am of 04/30/10 Doree Albee MD May 01, 2010 4:46 AM   Appended Document: PA request received notice from  [facility] asking if patient has tried nexium. placed in MD box.  Appended Document: PA request    Clinical Lists Changes  Medications: Added new medication of NEXIUM 20 MG CPDR (ESOMEPRAZOLE MAGNESIUM) 1 tablet daily - Signed Rx of NEXIUM 20 MG CPDR (ESOMEPRAZOLE MAGNESIUM) 1 tablet daily;  #30 x 3;  Signed;  Entered by: Doree Albee MD;  Authorized by: Doree Albee MD;  Method used: Electronically to [facility] [address] [phone]*, [address], [place], [place]  [address], Ph: [phone], Fax: [phone]    Prescriptions: NEXIUM 20 MG CPDR (ESOMEPRAZOLE MAGNESIUM) 1 tablet daily  #30 x 3   Entered and Authorized by:   Doree Albee MD   Signed by:   Doree Albee MD on 05/05/2010   Method used:   Electronically to        [facility] [phone]* (retail)       [address]       [place], [place]  [address]       Ph: [phone]       Fax: [phone]   RxID:   [id number]

## 2010-07-20 NOTE — Assessment & Plan Note (Signed)
Summary: New ob/kh   FLU SHOT GIVEN TODAY.Kayla Dunn, CMA  April 28, 2010 10:10 AM  Vital Signs:  Patient profile:   30 year old female LMP:     01/15/2010 Weight:      146.6 pounds BP sitting:   130 / 72  Primary Care Provider:  . [facility]   History of Present Illness: 30 YO [id number](1 ectopic pregnancies s/p mtx, 3 SABs, 2 preterm deliveries @ 36 weeks) here for new OB visit. Pt with noted twin gestational sac on most recent ultrasound 02/2010. Pt also noted to have hx/o  multiple 1st trimester SABs as well as 2 ectopic pregnancies s/p MTX.  Pt does report intermittent abd pain in the past and was seen at [facility] 02/2010. However this has resolved per pt. Pt does report intermiitent abd pressure. No vag bleeding per pt.  + sxs: Pt does report intermittent headache, sinus pressure, + nausea. Sinus:  Pt has had sinus sxs in the past. Was recenly treated for sinus infection 01/2010. Was previously rxd flonase. However pt has not used >8 months.  HA: Pt reports intermittent HA. HA frontal in nature. Somewhate related to sinus pressure per pt. Intermittent photophobia per pt.  Nausea: Was recently rxd zofran by [facility], no noted recent [facility] ED visit in echart). Per pt, zofran has helped with nausea. However, pt reports intermittent HA w/ zofran usage. No emesis per pt. Tolerating by mouth intake. Pt also reports intermittent reflux type sxs in setting of baseline GERD. Was rxd ranitidine. However, this has not helped.  Has not used tums or ppi.   Allergies: 1)  ! Asa 2)  ! Morphine 3)  ! * Dilaudid  Social History: Education:  2nd year of college   Physical Exam  General:  alert and well-developed.   Head:  normocephalic and atraumatic.   Eyes:  vision grossly intact.   Ears:  R ear normal and L ear normal.   Nose:  no external deformity.   Mouth:  good dentition.   Neck:  supple and full ROM.   Lungs:  normal respiratory effort.   Heart:  normal rate, regular rhythm, and no  murmur.   Abdomen:  soft, non-tender, and normal bowel sounds.   Extremities:  2+ peripheral pulses, no edema  Neurologic:  alert & oriented X3.   Psych:  memory intact for recent and remote, good eye contact, and not anxious appearing.     Impression & Recommendations:  Problem # 1:  TWIN GESTATION (ICD-651.00) Plan to refer pt to high risk OB in setting of twin gestation as well as multiple ectopic pregnancies, and SABs. Will also send for repeat U/S in setting of twin gestation as well as to better coorelate dates. Of note, most recent U/S 02/2010 noted to have no fetal pole on gestational sac A. Red flags reviewed with patient extensively.   HA: Likely secondary to chronic sinusitis. Will start on fluticasone nasal Sinus: see HA Nausea: Re-rxd zofran. Instructed pt to use tums as needed. Also cleared pt use of protonix for GERD if sxs unrelieved with tums.    Orders: Prenatal U/S > 14 weeks - 19147 (Prenatal U/S) Obstetric Referral (Obstetric)  Problem # 2:  SINUSITIS (ICD-473.9) Will start on flonase Her updated medication list for this problem includes:    Amoxicillin 500 Mg Caps (Amoxicillin) .Marland Kitchen... 1 tab by mouth two times a day x 7 days    Nasal Moisturizer 0.65 % Soln (Saline) .Marland Kitchen... 2 sprays  in each nostril once daily    Fluticasone Propionate 50 Mcg/act Susp (Fluticasone propionate) .Marland Kitchen... 1 spray in each nostril daily  Problem # 3:  GERD (ICD-530.81) Will STOP ranitidine. Will start protonix and tums. Will start protonix if sxs unrelieved w/ tums.  Her updated medication list for this problem includes:    Ranitidine Hcl 150 Mg Caps (Ranitidine hcl) .Marland Kitchen... 1 cap two times a day 30 minutes before meals for heartburn.    Protonix 40 Mg Tbec (Pantoprazole sodium) .Marland Kitchen... 1 tablet daily for reflux  Complete Medication List: 1)  Prenavite Multiple Vitamin 28-0.8 Mg Tabs (Prenatal vit-fe fumarate-fa) .Marland Kitchen.. 1 tab by mouth daily 2)  Ranitidine Hcl 150 Mg Caps (Ranitidine hcl) .Marland Kitchen.. 1  cap two times a day 30 minutes before meals for heartburn. 3)  Unisom 25 Mg Tabs (Doxylamine succinate (sleep)) .Marland Kitchen.. 1 tab three times a day for nausea. may cause drowsiness 4)  Vitamin B-6 25 Mg Tabs (Pyridoxine hcl) .Marland Kitchen.. 1 tab every 6 hours as needed nausea. take with unisom 5)  Amoxicillin 500 Mg Caps (Amoxicillin) .Marland Kitchen.. 1 tab by mouth two times a day x 7 days 6)  Nasal Moisturizer 0.65 % Soln (Saline) .... 2 sprays in each nostril once daily 7)  Fluticasone Propionate 50 Mcg/act Susp (Fluticasone propionate) .Marland Kitchen.. 1 spray in each nostril daily 8)  Zofran 4 Mg Tabs (Ondansetron hcl) .Marland Kitchen.. 1 tab every 6-8 hours as needed for nausea 9)  Protonix 40 Mg Tbec (Pantoprazole sodium) .Marland Kitchen.. 1 tablet daily for reflux  Other Orders: Flu Vaccine 32yrs + (16109) Admin 1st Vaccine (60454)  Patient Instructions: 1)  It was very good to see you again 2)  Congratulations on your pregnancy 3)  We will be referring you to high risk Obstetrics given your complicated pregnancy history 4)  For your nausea-->continue using zofran; also use vitamin b6  5)  For your reflux--> you can use omeprazole and tums  6)  For your constipation--> You can use OTC colace or miralax as needed 7)  For your sinuses--> I will represcribe nasal fluticasone-a nasal steroid  8)  You will get a flu shot today 9)  We will also send you for a repeat ultrasound  10)  If you develop any severe abdominal cramping, vaginal bleeding, or any other concerning symptoms, please [facility] be [facility].  11)  Otherwise call with any questions 12)  God Bless and again Congratulations!! 13)  Doree Albee MD  Prescriptions: PROTONIX 40 MG TBEC (PANTOPRAZOLE SODIUM) 1 tablet daily for reflux  #30 x 3   Entered and Authorized by:   Doree Albee MD   Signed by:   Doree Albee MD on 04/28/2010   Method used:   Print then Give to Patient   RxID:   [id number] ZOFRAN 4 MG TABS (ONDANSETRON HCL) 1 tab every 6-8 hours as needed  for nausea  #60 x 2   Entered and Authorized by:   Doree Albee MD   Signed by:   Doree Albee MD on 04/28/2010   Method used:   Print then Give to Patient   RxID:   [id number] FLUTICASONE PROPIONATE 50 MCG/ACT SUSP (FLUTICASONE PROPIONATE) 1 spray in each nostril daily  #1 bottle x 6   Entered and Authorized by:   Doree Albee MD   Signed by:   Doree Albee MD on 04/28/2010   Method used:   Print then Give to Patient   RxID:   [id number] NASAL MOISTURIZER 0.65 %  SOLN (SALINE) 2 sprays in each nostril once daily  #1 x 1   Entered and Authorized by:   Doree Albee MD   Signed by:   Doree Albee MD on 04/28/2010   Method used:   Print then Give to Patient   RxID:   [id number] VITAMIN B-6 25 MG TABS (PYRIDOXINE HCL) 1 tab every 6 hours as needed nausea. take with unisom  #120 x 3   Entered and Authorized by:   Doree Albee MD   Signed by:   Doree Albee MD on 04/28/2010   Method used:   Print then Give to Patient   RxID:   [id number]    Orders Added: 1)  Prenatal U/S > 14 weeks - 41324 [Prenatal U/S] 2)  Flu Vaccine 62yrs + [40102] 3)  Admin 1st Vaccine [90471] 4)  Obstetric Referral [Obstetric]   Immunizations Administered:  Influenza Vaccine # 1:    Vaccine Type: Fluvax 3+    Site: left deltoid    Mfr: GlaxoSmithKline    Dose: 0.5 ml    Route: IM    Given by: Kayla Dunn, CMA    Exp. Date: 12/18/2010    Lot #: [id number]    VIS given: 01/12/10 version given April 28, 2010.  Flu Vaccine Consent Questions:    Do you have a history of severe allergic reactions to this vaccine? no    Any prior history of allergic reactions to egg and/or gelatin? no    Do you have a sensitivity to the preservative Thimersol? no    Do you have a past history of Guillan-Barre Syndrome? no    Do you currently have an acute febrile illness? no    Have you ever had a severe reaction to latex? no    Vaccine information given and explained to patient? yes    Are  you currently pregnant? yes   Immunizations Administered:  Influenza Vaccine # 1:    Vaccine Type: Fluvax 3+    Site: left deltoid    Mfr: GlaxoSmithKline    Dose: 0.5 ml    Route: IM    Given by: Kayla Dunn, CMA    Exp. Date: 12/18/2010    Lot #: [id number]    VIS given: 01/12/10 version given April 28, 2010.   OB Initial Intake Information    Positive HCG by: self    Race: Black    Marital status: Single    Occupation: homemaker    Education (last grade completed): 2nd year of college     Number of children at home: 3  FOB Information    Husband/Father of baby: Verlin Grills     FOB occupation Musician     Phone: [phone]  Menstrual History    LMP (date): 01/15/2010    EDC by LMP: 10/22/2010    Best Working EDC: 10/22/2010    LMP - Character: normal    LMP - Reliable? : Yes    Menarche: 13 years    Menses interval: irregular days    Menstrual flow 3-5 days    On BCP's at conception: no    Pre Pregnancy Weight: 140 lbs.    Symptoms since LMP: amenorrhea, nausea, vomiting, fatigue, irritability, urinary frequency   Flowsheet View for Follow-up Visit    Estimated weeks of       gestation:     14 5/7    Weight:     146.6    Blood pressure:   130 / 72  Headache:     No    Nausea/vomiting:   No    Edema:     0    Vaginal bleeding:   no    Vaginal discharge:   no    Fundal height:      below umbilicus    FHR:       145    Fetal activity:     N/A    Labor symptoms:   no    Flowsheet View for Follow-up Visit    Estimated weeks of       gestation:     14 5/7    Weight:     146.6    Blood pressure:   130 / 72    Hx headache?     No    Nausea/vomiting?   No    Edema?     0    Bleeding?     no    Leakage/discharge?   no    Fetal activity:       N/A    Labor symptoms?   no    Fundal height:      below umbilicus    FHR:       145   OB Initial Intake Information    Positive HCG by: self    Race: Black    Marital status: Single    Occupation:  homemaker    Education (last grade completed): 2nd year of college     Number of children at home: 3  FOB Information    Husband/Father of baby: Verlin Grills     FOB occupation Musician     Phone: [phone]  Menstrual History    LMP (date): 01/15/2010    EDC by LMP: 10/22/2010    Best Working EDC: 10/22/2010    LMP - Character: normal    LMP - Reliable? : Yes    Menarche: 13 years    Menses interval: irregular days    Menstrual flow 3-5 days    On BCP's at conception: no    Pre Pregnancy Weight: 140 lbs.    Symptoms since LMP: amenorrhea, nausea, vomiting, fatigue, irritability, urinary frequency   Past Pregnancy History    Gravida:     9    Term Births:     1    Premature Births:   2    Living Children:   3    Para:       3    Prev C-Section:   1    Prev. VBAC attempt?   success x2+    Aborta:     5    Spont. Ab:     5    Ectopics:     2  Pregnancy # 1    Delivery date:     09/06/1997    Weeks Gestation:   42    Preterm labor:     yes    Delivery type:     C-section    Hours of labor:     29    Infant Sex:     Female    Birth weight:     7'8''    Comments:     Preeclampsia  Pregnancy # 2    Delivery date:     01/28/2002    Weeks Gestation:   36    Preterm labor:     yes    Delivery type:     VBAC    Infant Sex:  Female    Birth weight:     6    Comments:     +GBS; on bed rest from 3 months   Pregnancy # 3    Comments:     ectopic pregnancy  at 7 weeks  Pregnancy # 4    Comments:     miscarriage at 6 weeks   Pregnancy # 5    Comments:     miscarriage 6 weeks  Pregnancy # 6    Comments:     miscarriage at 6 weeks  Pregnancy # 7    Delivery date:     02/24/2007    Weeks Gestation:   36    Preterm labor:     yes    Delivery type:     VBAC    Infant Sex:     Female    Birth weight:     5'13''  Pregnancy # 8    Comments:     Ectopic Pregnancy @ 3-4 weeks    Genetic History     Thalassemia:     mother: no    Neural tube defect:   mother:  no    Down's Syndrome:   mother: no    Tay-Sachs:     mother: no    Sickle Cell Dz/Trait:   mother: no    Hemophilia:     mother: no    Muscular Dystrophy:   mother: no    Cystic Fibrosis:   mother: no    Huntington's Dz:   mother: no    Mental Retardation:   mother: no    Fragile X:     mother: no    Other Genetic or       Chromosomal Dz:   mother: no    Child with other       birth defect:     mother: no    > 3 spont. abortions:   mother: yes    Hx of stillbirth:     mother: no  Infection Risk History    History of STD (GC, Chlamydia, Syphilis, HPV): yes    Specific STD: GC and Chl     Rash, Viral, or Febrile Illness since LMP: no    Exposure to Cat Litter: yes    Chicken Pox Immune Status: Hx of Disease: Immune  Environmental Exposures    Xray Exposure since LMP: no    Medication, drug, or alcohol use since LMP: no  Prenatal Visit    FOB name: Verlin Grills  [facility] Confirmation:    New working EDC: 10/22/2010    LMP reliable? Yes    Last menses onset (LMP) date: 01/15/2010    EDC by LMP: 10/22/2010     Appended Document: New ob/kh    Clinical Lists Changes  Orders: Added new Test order of [facility]- Est Level  3 ([address]) - Signed      Appended Document: New ob/kh-Serologies     Allergies: 1)  ! Asa 2)  ! Morphine 3)  ! * Dilaudid   Impression & Recommendations:  Problem # 1:  TWIN GESTATION (ICD-651.00) Serologies: O neg, Ab neg, H&H 11.3/33.6, RPR NR, SS neg, HIV NR, urine cx no growth  Complete Medication List: 1)  Prenavite Multiple Vitamin 28-0.8 Mg Tabs (Prenatal vit-fe fumarate-fa) .Marland Kitchen.. 1 tab by mouth daily 2)  Ranitidine Hcl 150 Mg Caps (Ranitidine hcl) .Marland Kitchen.. 1 cap two times a day 30 minutes before meals for heartburn. 3)  Unisom 25 Mg Tabs (Doxylamine  succinate (sleep)) .Marland Kitchen.. 1 tab three times a day for nausea. may cause drowsiness 4)  Vitamin B-6 25 Mg Tabs (Pyridoxine hcl) .Marland Kitchen.. 1 tab every 6 hours as needed nausea. take with unisom 5)   Amoxicillin 500 Mg Caps (Amoxicillin) .Marland Kitchen.. 1 tab by mouth two times a day x 7 days 6)  Nasal Moisturizer 0.65 % Soln (Saline) .... 2 sprays in each nostril once daily 7)  Fluticasone Propionate 50 Mcg/act Susp (Fluticasone propionate) .Marland Kitchen.. 1 spray in each nostril daily 8)  Zofran 4 Mg Tabs (Ondansetron hcl) .Marland Kitchen.. 1 tab every 6-8 hours as needed for nausea 9)  Protonix 40 Mg Tbec (Pantoprazole sodium) .Marland Kitchen.. 1 tablet daily for reflux       Flowsheet View for Follow-up Visit    Estimated weeks of       gestation:     14 5/7  Prenatal Visit Concerns noted: hx/o multiple 1st trimester SABs as well as ectopic pregnancy s/p MTX.

## 2010-07-20 NOTE — Progress Notes (Signed)
----   Converted from flag ---- ---- 05/05/2010 4:45 PM, Doree Albee MD wrote: Please inform pt that new refulx medication has been sent to pharmacy as this is the medication her insurance has approved.  Thank You Doree Albee MD ------------------------------  called pt informed of above.

## 2010-07-20 NOTE — Progress Notes (Signed)
Summary: triage  Phone Note Call from Patient Call back at [phone]   Caller: Patient Summary of Call: Started period last night bleeding heavily and now getting really weak from it and also has migraine. Initial call taken by: Clydell Hakim,  December 17, 2009 9:05 AM  Follow-up for Phone Call        has nothing to take for her migraine. hx of them. states otc do not help. also bleeding thru tampons q2 hr. states this is unusual for her. offered pm work in. she wants to be seen now. we have no appts left. sent to [facility] Follow-up by: Golden Circle RN,  December 17, 2009 9:09 AM     Appended Document: triage we have no appt this am. she declined pm work in

## 2010-07-20 NOTE — Assessment & Plan Note (Signed)
Summary: ingested brownie w/marajuana/18 wks preg/sinus infection/eo   Vital Signs:  Patient profile:   30 year old female Height:      61 inches Weight:      151 pounds Temp:     98.4 degrees F oral Pulse rate:   90 / minute BP sitting:   129 / 68  (right arm) Cuff size:   regular  Vitals Entered By: Tessie Fass CMA (May 24, 2010 3:35 PM) CC: sinus infection Pain Assessment Patient in pain? no        Primary Care Provider:  . [facility]  CC:  sinus infection.  History of Present Illness: 1) Ingested marijuana brownies: Reports that she ate marijauna brownies accidentally last night (a family member had made them and she came home and ate them). She went to [facility] after noticing irregular heartbeat and dizziness. FHR was 145. EKG showed NSR rate 82, normal axes and intervals, possible LAE, no ST or T wave changes. Reports that she does not "feel right" yet, but symptoms are improving.  Denies worsening nausea, denies diarrhea, denies current palpitations, denies paranoia or hallucinations   2) Sinusitis: Sinus pressure, nasal congestion, rhinorrhea (yellow), cough (w/ yellow sputum) x past 2-3 weeks. History of prior sinus infections (last treated with antibiotics in September 2011). Started on Flonase by PCP at last visit (04/28/10) with little relief. Reports subjective fever last week now resolved.  Denies emesis, nosebleed, conjunctivitis, ear pain   Current Medications (verified): 1)  Prenavite Multiple Vitamin 28-0.8 Mg Tabs (Prenatal Vit-Fe Fumarate-Fa) .Marland Kitchen.. 1 Tab By Mouth Daily 2)  Vitamin B-6 25 Mg Tabs (Pyridoxine Hcl) .Marland Kitchen.. 1 Tab Every 6 Hours As Needed Nausea. Take With Unisom 3)  Nasal Moisturizer 0.65 % Soln (Saline) .... 2 Sprays in Each Nostril Once Daily 4)  Fluticasone Propionate 50 Mcg/act Susp (Fluticasone Propionate) .Marland Kitchen.. 1 Spray in Each Nostril Daily 5)  Zofran 4 Mg Tabs (Ondansetron Hcl) .Marland Kitchen.. 1 Tab Every 6-8 Hours As Needed For Nausea 6)   Nexium 20 Mg Cpdr (Esomeprazole Magnesium) .Marland Kitchen.. 1 Tablet Daily  Allergies (verified): 1)  ! Asa 2)  ! Morphine 3)  ! * Dilaudid  Physical Exam  General:  alert and well-developed.   Head:  tender over frontal and maxillary sinuses bilaterally  Eyes:  no conjunctivitis  Ears:  R ear normal and L ear normal.   Nose:  nasal congestion  Mouth:  moist membranes, no erythema or exudate  Neck:  no lymphadenopathy   Lungs:  CTAB w/o wheezing or crackles, normal respiratory effort   Heart:  normal rate, regular rhythm..   Abdomen:  soft, non-tender, and normal bowel sounds.   gravid.  Neurologic:  alert & oriented X3.  cranial nerves II-XII intact, strength normal in all extremities, and gait normal.     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 Advised to drink water. Follow up as scheduled   Orders: [facility]- Est  Level 4 (04540)  Problem # 2:  SINUSITIS (ICD-473.9) Assessment: New  Will treat with Agumentin as below given duration of symptoms. Follow up with PCP as scheduled. Continue Flonase, nasal saline.   The following medications were removed from the medication list:    Amoxicillin 500 Mg Caps (Amoxicillin) .Marland Kitchen... 1 tab by mouth two times a day x 7 days Her updated medication list for this problem includes:    Nasal Moisturizer 0.65 %  Soln (Saline) .Marland Kitchen... 2 sprays in each nostril once daily    Fluticasone Propionate 50 Mcg/act Susp (Fluticasone propionate) .Marland Kitchen... 1 spray in each nostril daily    Augmentin 500-125 Mg Tabs (Amoxicillin-pot clavulanate) ..... One tab by mouth three times a day x 10 days  Orders: [facility]- Est  Level 4 (57846)  Complete Medication List: 1)  Prenavite Multiple Vitamin 28-0.8 Mg Tabs (Prenatal vit-fe fumarate-fa) .Marland Kitchen.. 1 tab by mouth daily 2)  Vitamin B-6 25 Mg Tabs (Pyridoxine hcl) .Marland Kitchen.. 1 tab every 6 hours as needed nausea. take with  unisom 3)  Nasal Moisturizer 0.65 % Soln (Saline) .... 2 sprays in each nostril once daily 4)  Fluticasone Propionate 50 Mcg/act Susp (Fluticasone propionate) .Marland Kitchen.. 1 spray in each nostril daily 5)  Zofran 4 Mg Tabs (Ondansetron hcl) .Marland Kitchen.. 1 tab every 6-8 hours as needed for nausea 6)  Nexium 20 Mg Cpdr (Esomeprazole magnesium) .Marland Kitchen.. 1 tablet daily 7)  Augmentin 500-125 Mg Tabs (Amoxicillin-pot clavulanate) .... One tab by mouth three times a day x 10 days  Patient Instructions: 1)  Take the antibiotic as directed. 2)  Drink lots of water to help flush your system. 3)  Work on quitting smoking completely - I am glad that you are working on this - this well help both you and your baby  4)  Follow up for your prenatal care as directed  Prescriptions: AUGMENTIN 500-125 MG TABS (AMOXICILLIN-POT CLAVULANATE) one tab by mouth three times a day x 10 days  #30 x 0   Entered and Authorized by:   Bobby Rumpf  MD   Signed by:   Bobby Rumpf  MD on 05/25/2010   Method used:   Electronically to        [facility] [phone]* (retail)       [address]       [place], [place]  [address]       Ph: [phone]       Fax: [phone]   RxID:   [id number]    Orders Added: 1)  [facility]- Est  Level 4 [18841]    patient calls stating pharmacy did not receive RX sent in yesterday. called and verbally gave RX to pharmacist for the Augmentin. Theresia Lo RN  May 25, 2010 11:13 AM

## 2010-07-20 NOTE — Miscellaneous (Signed)
  Clinical Lists Changes  Medications: Changed medication from ALTAMIST SPRAY 0.65 % SOLN (SALINE) 2 sprays in each nostril daily to NASAL MOISTURIZER 0.65 % SOLN (SALINE) 2 sprays in each nostril once daily - Signed Rx of NASAL MOISTURIZER 0.65 % SOLN (SALINE) 2 sprays in each nostril once daily;  #1 x 1;  Signed;  Entered by: Angeline Slim MD;  Authorized by: Angeline Slim MD;  Method used: Electronically to [facility] [phone]*, [address] [place], [place]  [address], Ph: [phone], Fax: [phone]    Prescriptions: NASAL MOISTURIZER 0.65 % SOLN (SALINE) 2 sprays in each nostril once daily  #1 x 1   Entered and Authorized by:   Angeline Slim MD   Signed by:   Angeline Slim MD on 03/11/2010   Method used:   Electronically to        [facility] [address] [phone]* (retail)       [address]       [place], [place]  [address]       Ph: [phone]       Fax: [phone]   RxID:   [id number]

## 2010-07-22 ENCOUNTER — Ambulatory Visit: Payer: Self-pay

## 2010-07-22 NOTE — Miscellaneous (Signed)
  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

## 2010-07-22 NOTE — Progress Notes (Signed)
Summary: Req to call pharmacy  Phone Note Call from Patient Call back at Home Phone [phone]   Summary of Call: pt is requesting we call her pharmacy to let them know she is pregnant & her copays should be waived, pharmacy awaiting our call, pt goes to [facility].  Initial call taken by: Knox Royalty,  May 28, 2010 12:29 PM  Follow-up for Phone Call        explained to patient that we cannot call the pharmacy and tell them she is pregnant. advised we can give her a letter of verification of pregancy. she states she already has this, advised her to take that to pharmacy. she has talked with medicaid in [place] she states and they told her to have her dostor's  office call the pharmacy to notify them that she is pregnant so the copay will be waived. advised her all we can do is give her the letter of verification of pregnacy and she will have to take to pharmacy. she will take the letter she has on hand. Follow-up by: Theresia Lo RN,  May 28, 2010 2:00 PM      Flowsheet View for Follow-up Visit    Estimated weeks of       gestation:     19 0/7

## 2010-07-29 ENCOUNTER — Encounter: Payer: Self-pay | Admitting: Physician Assistant

## 2010-07-29 ENCOUNTER — Other Ambulatory Visit: Payer: Self-pay | Admitting: Family Medicine

## 2010-07-29 ENCOUNTER — Other Ambulatory Visit: Payer: Self-pay

## 2010-07-29 DIAGNOSIS — O30009 Twin pregnancy, unspecified number of placenta and unspecified number of amniotic sacs, unspecified trimester: Secondary | ICD-10-CM

## 2010-07-29 DIAGNOSIS — O34219 Maternal care for unspecified type scar from previous cesarean delivery: Secondary | ICD-10-CM

## 2010-07-29 DIAGNOSIS — O09219 Supervision of pregnancy with history of pre-term labor, unspecified trimester: Secondary | ICD-10-CM

## 2010-07-29 DIAGNOSIS — O99330 Smoking (tobacco) complicating pregnancy, unspecified trimester: Secondary | ICD-10-CM

## 2010-07-29 LAB — POCT URINALYSIS DIPSTICK
Bilirubin Urine: NEGATIVE
Hgb urine dipstick: NEGATIVE
Ketones, ur: NEGATIVE mg/dL
Urine Glucose, Fasting: NEGATIVE mg/dL
pH: 6.5 (ref 5.0–8.0)

## 2010-08-02 ENCOUNTER — Ambulatory Visit (HOSPITAL_COMMUNITY)
Admission: RE | Admit: 2010-08-02 | Discharge: 2010-08-02 | Disposition: A | Discharge status: Home / Self Care | Payer: Medicaid Other | Source: Ambulatory Visit | Attending: Family Medicine | Admitting: Family Medicine

## 2010-08-02 ENCOUNTER — Encounter: Payer: Self-pay | Admitting: Obstetrics & Gynecology

## 2010-08-02 ENCOUNTER — Ambulatory Visit: Payer: Medicaid Other

## 2010-08-02 DIAGNOSIS — O99330 Smoking (tobacco) complicating pregnancy, unspecified trimester: Secondary | ICD-10-CM | POA: Insufficient documentation

## 2010-08-02 DIAGNOSIS — Z0189 Encounter for other specified special examinations: Secondary | ICD-10-CM

## 2010-08-02 DIAGNOSIS — Z8751 Personal history of pre-term labor: Secondary | ICD-10-CM | POA: Insufficient documentation

## 2010-08-02 DIAGNOSIS — Z3689 Encounter for other specified antenatal screening: Secondary | ICD-10-CM | POA: Insufficient documentation

## 2010-08-02 LAB — CONVERTED CEMR LAB
Albumin: 3.7 g/dL (ref 3.5–5.2)
Alkaline Phosphatase: 89 units/L (ref 39–117)
BUN: 9 mg/dL (ref 6–23)
CO2: 23 meq/L (ref 19–32)
Collection Interval-CRCL: 24 hr
Creatinine 24 HR UR: 1078 mg/24hr (ref 700–1800)
Creatinine, Urine: 123.2 mg/dL
Glucose, Bld: 78 mg/dL (ref 70–99)
Hemoglobin: 9.4 g/dL — ABNORMAL LOW (ref 12.0–15.0)
MCHC: 32.5 g/dL (ref 30.0–36.0)
MCV: 91.7 fL (ref 78.0–100.0)
Potassium: 4 meq/L (ref 3.5–5.3)
RBC: 3.15 M/uL — ABNORMAL LOW (ref 3.87–5.11)
Total Protein: 6.3 g/dL (ref 6.0–8.3)
WBC: 7.5 10*3/uL (ref 4.0–10.5)

## 2010-08-02 IMAGING — US US OB FOLLOW-UP
1 series · 12 of 28 positions shown · non-contrast
Comparison: none

[Series 1: us ob follow up · 12 of 38 slices shown]
[im 2/38]
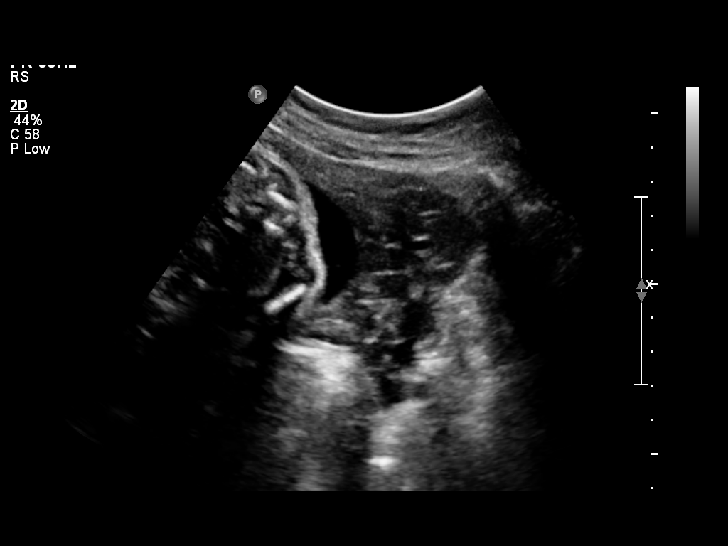
[im 5/38]
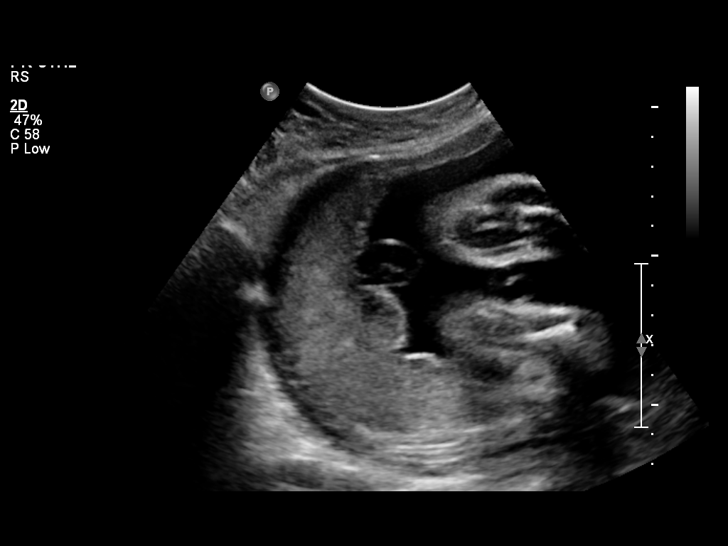
[im 7/38]
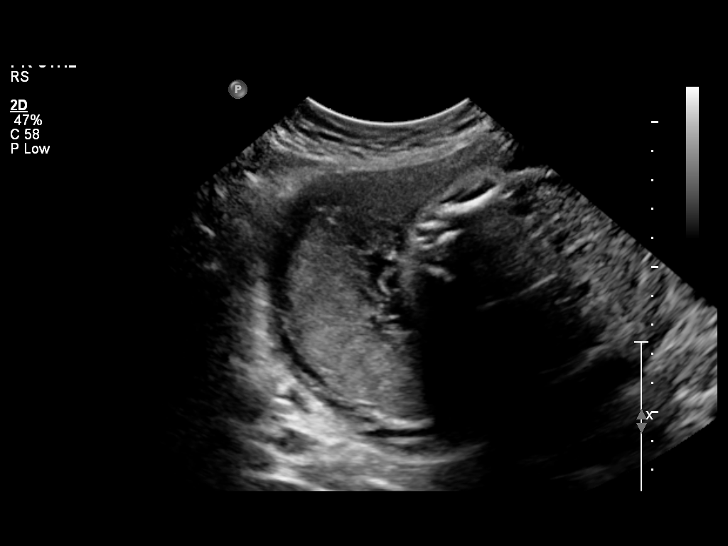
[im 11/38]
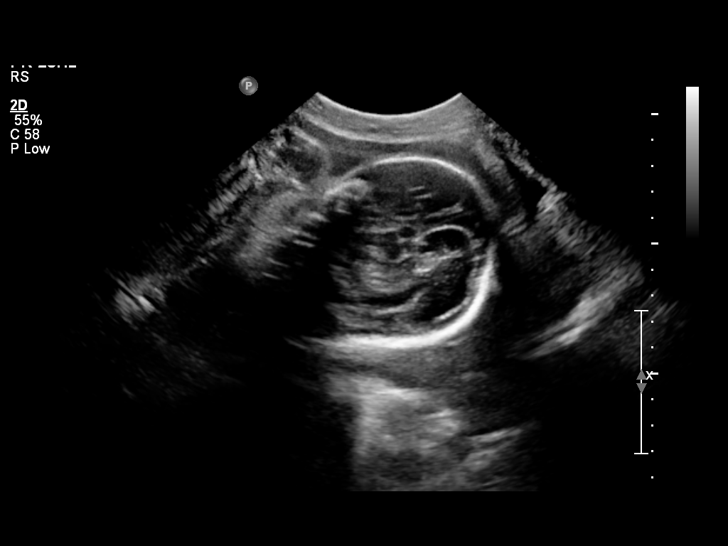
[im 14/38]
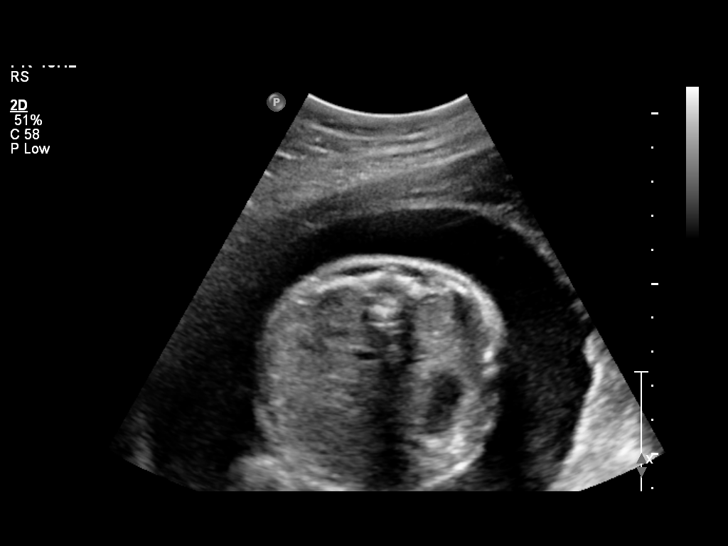
[im 17/38]
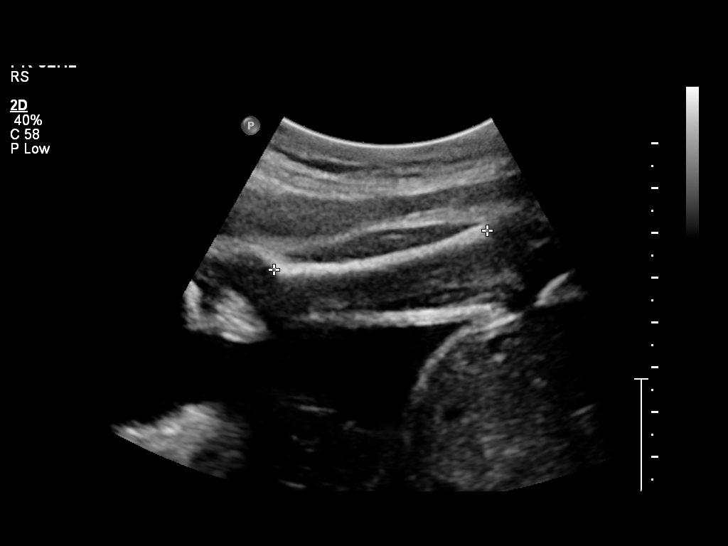
[im 21/38]
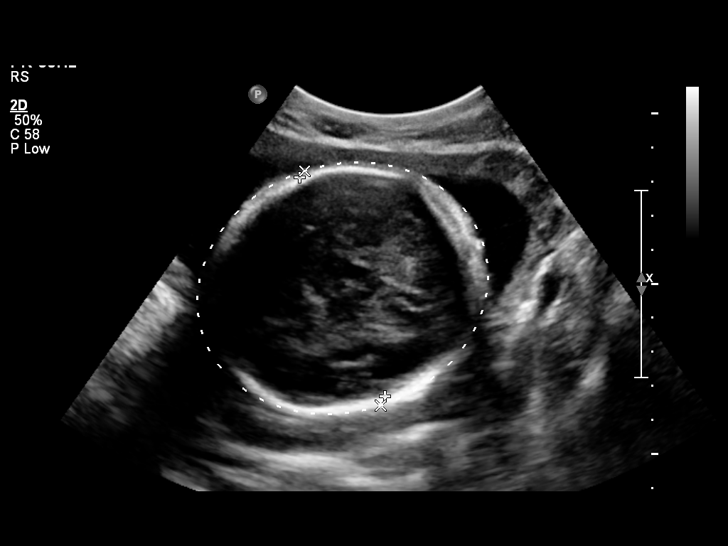
[im 24/38]
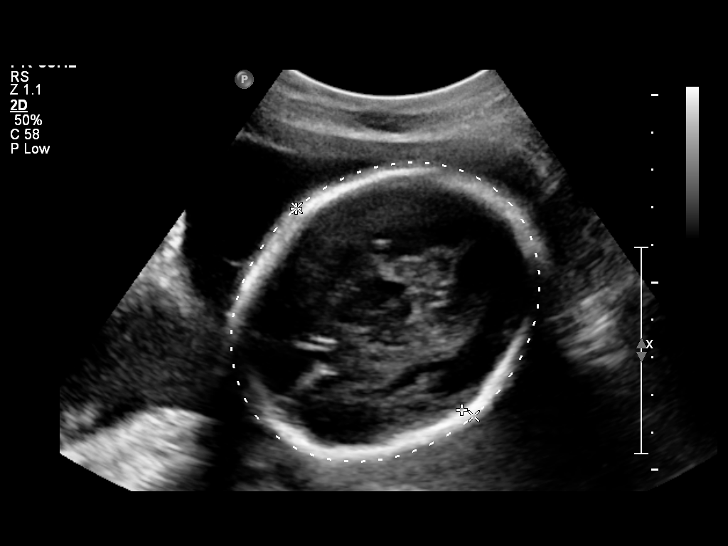
[im 27/38]
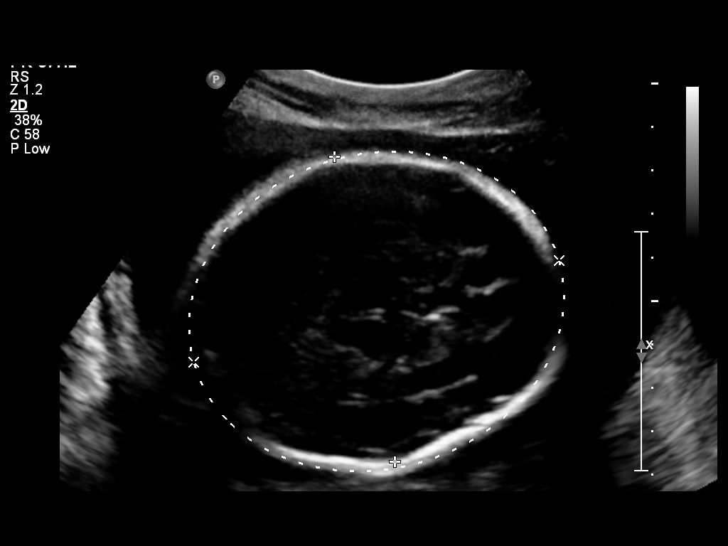
[im 31/38]
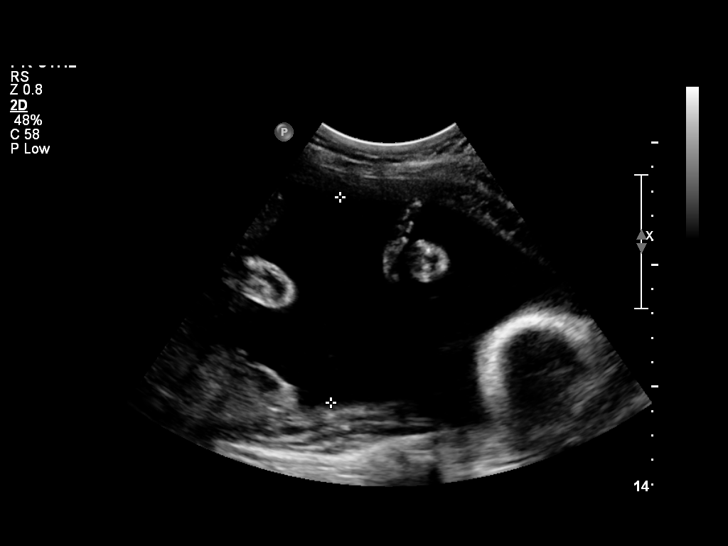
[im 33/38]
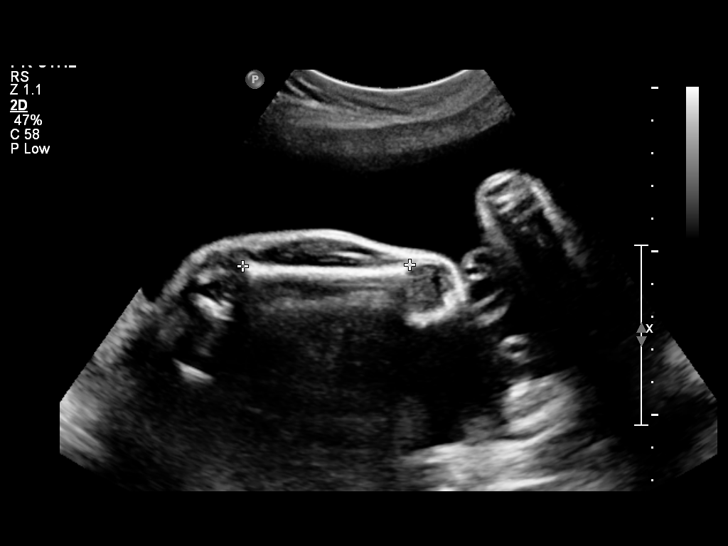
[im 36/38]
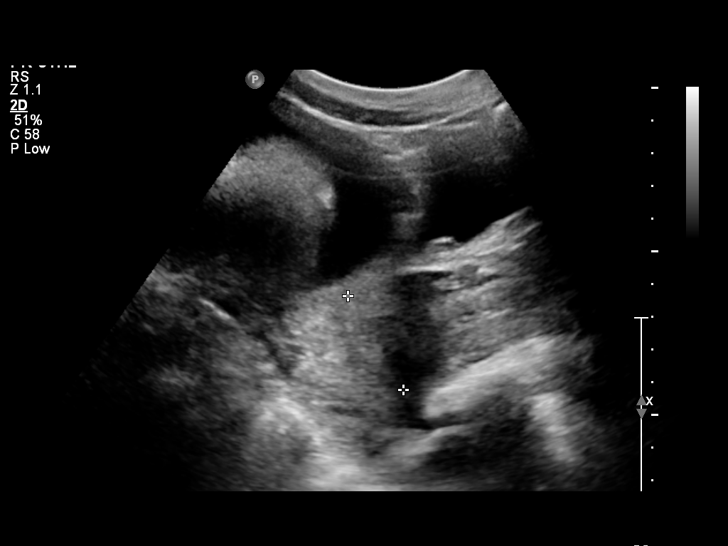

[12 of 28 positions shown; findings below may reference images not displayed]

OBSTETRICS REPORT
                      (Signed Final [DATE] [DATE])

 Order#:         [PHONE_NUMBER]_O
Procedures

 US OB FOLLOW UP                                       76816.1
Indications

 Poor obstetric history: Previous preterm delivery     [HV]
 History of ectopic pregnancy
 Cigarette smoker

Fetal Evaluation (Fetus B)

 Fetal Heart Rate:  125                          bpm
 Cardiac Activity:  Observed
 Presentation:      Cephalic
 Placenta:          Posterior, above cervical
                    os
 P. Cord            Visualized
 Insertion:

 Amniotic Fluid
 AFI FV:      Subjectively upper-normal
                                             Larg Pckt:     8.4  cm
Biometry (Fetus B)

 BPD:     69.7  mm     G. Age:  28w 0d                CI:        77.19   70 - 86
                                                      FL/HC:      19.8   18.6 -

 HC:     251.2  mm     G. Age:  27w 2d       19  %    HC/AC:      1.10   1.05 -

 AC:     229.4  mm     G. Age:  27w 2d       39  %    FL/BPD:     71.3   71 - 87
 FL:      49.7  mm     G. Age:  26w 5d       19  %    FL/AC:      21.7   20 - 24

 Est. FW:    [HV]  gm      2 lb 5 oz     46  %
Gestational Age (Fetus B)

 LMP:           28w 3d        Date:  [DATE]                 EDD:   [DATE]
 U/S Today:     27w 2d                                        EDD:   [DATE]
 Best:          27w 3d     Det. By:  U/S C R L Fetus B        EDD:   [DATE]
                                     ([DATE])
Anatomy (Fetus B)

 Cranium:           Appears normal      Aortic Arch:       Basic anatomy
                                                           exam per order
 Fetal Cavum:       Appears normal      Ductal Arch:       Basic anatomy
                                                           exam per order
 Ventricles:        Appears normal      Diaphragm:         Appears normal
 Choroid Plexus:    Appears normal      Stomach:           Appears normal
 Cerebellum:        Appears normal      Abdomen:           Appears normal
 Posterior Fossa:   Previously seen     Abdominal Wall:    Appears nml
                                                           (cord insert,
                                                           abd wall)
 Nuchal Fold:       Previously seen     Cord Vessels:      Appears normal
                                                           (3 vessel cord)
 Face:              Lips previously     Kidneys:           Previously seen
                    seen
 Heart:             Not well            Bladder:           Appears normal
                    visualized
                    today. Seen
                    previously.
 RVOT:              Basic anatomy       Spine:             Previously seen
                    exam per order
 LVOT:              Basic anatomy       Limbs:             Previously seen
                    exam per order

 Other:     Technically difficult due to fetal position.
Cervix Uterus Adnexa

 Cervical Length:    3.3      cm

 Cervix:       Closed. Normal appearance by transabdominal scan.

 Adnexa:     No abnormality visualized.
Impression

 Single intrauterine gestation demonstrating an estimated
 gestational age by ultrasound of 27w 2d. This correlates well
 with expected estimated gestational age by early ultrasound
 of 27w 3d. EFW is currently at the 46%.

 No late developing fetal anatomic abnormalities are noted
 associated with the lateral ventricles,stomach, kidneys or
 bladder. The four chamber heart could not be well assessed
 due to fetal positioning.

 Subjectively upper normal amniotic fluid volume with a single
 pocket depth of 8 cm.

 Normal cervical length and appearance.

## 2010-08-11 ENCOUNTER — Encounter: Payer: Self-pay | Admitting: *Deleted

## 2010-08-19 ENCOUNTER — Other Ambulatory Visit: Payer: Self-pay

## 2010-08-19 DIAGNOSIS — O99330 Smoking (tobacco) complicating pregnancy, unspecified trimester: Secondary | ICD-10-CM

## 2010-08-19 DIAGNOSIS — O09219 Supervision of pregnancy with history of pre-term labor, unspecified trimester: Secondary | ICD-10-CM

## 2010-08-19 LAB — POCT URINALYSIS DIPSTICK
Hgb urine dipstick: NEGATIVE
Nitrite: NEGATIVE
Protein, ur: NEGATIVE mg/dL
Specific Gravity, Urine: 1.02 (ref 1.005–1.030)
Urine Glucose, Fasting: NEGATIVE mg/dL
Urobilinogen, UA: 1 mg/dL (ref 0.0–1.0)
pH: 6.5 (ref 5.0–8.0)

## 2010-08-30 LAB — POCT URINALYSIS DIPSTICK
Glucose, UA: NEGATIVE mg/dL
Hgb urine dipstick: NEGATIVE
Nitrite: NEGATIVE
Protein, ur: NEGATIVE mg/dL
Specific Gravity, Urine: 1.025 (ref 1.005–1.030)
Urobilinogen, UA: 1 mg/dL (ref 0.0–1.0)
pH: 6.5 (ref 5.0–8.0)

## 2010-08-30 LAB — URINALYSIS, ROUTINE W REFLEX MICROSCOPIC
Bilirubin Urine: NEGATIVE
Glucose, UA: NEGATIVE mg/dL
Hgb urine dipstick: NEGATIVE
Ketones, ur: 15 mg/dL — AB
Protein, ur: NEGATIVE mg/dL
Urobilinogen, UA: 1 mg/dL (ref 0.0–1.0)

## 2010-08-31 LAB — POCT URINALYSIS DIPSTICK
Bilirubin Urine: NEGATIVE
Hgb urine dipstick: NEGATIVE
Nitrite: NEGATIVE
Protein, ur: NEGATIVE mg/dL
pH: 7 (ref 5.0–8.0)

## 2010-09-01 LAB — URINALYSIS, ROUTINE W REFLEX MICROSCOPIC
Bilirubin Urine: NEGATIVE
Hgb urine dipstick: NEGATIVE
Ketones, ur: NEGATIVE mg/dL
Specific Gravity, Urine: 1.02 (ref 1.005–1.030)
Urobilinogen, UA: 1 mg/dL (ref 0.0–1.0)

## 2010-09-02 LAB — CBC
Hemoglobin: 11.4 g/dL — ABNORMAL LOW (ref 12.0–15.0)
MCH: 31.3 pg (ref 26.0–34.0)
Platelets: 306 10*3/uL (ref 150–400)
RBC: 3.81 MIL/uL — ABNORMAL LOW (ref 3.87–5.11)
RBC: 3.65 MIL/uL — ABNORMAL LOW (ref 3.87–5.11)
RDW: 13 % (ref 11.5–15.5)
WBC: 7.1 10*3/uL (ref 4.0–10.5)

## 2010-09-02 LAB — HCG, QUANTITATIVE, PREGNANCY
hCG, Beta Chain, Quant, S: 12368 m[IU]/mL — ABNORMAL HIGH (ref <5)
hCG, Beta Chain, Quant, S: 514 m[IU]/mL — ABNORMAL HIGH (ref <5)

## 2010-09-02 LAB — WET PREP, GENITAL
Trich, Wet Prep: NONE SEEN
WBC, Wet Prep HPF POC: NONE SEEN
Yeast Wet Prep HPF POC: NONE SEEN

## 2010-09-02 LAB — URINALYSIS, ROUTINE W REFLEX MICROSCOPIC
Bilirubin Urine: NEGATIVE
Glucose, UA: NEGATIVE mg/dL
Glucose, UA: NEGATIVE mg/dL
Hgb urine dipstick: NEGATIVE
Nitrite: NEGATIVE
Nitrite: NEGATIVE
Protein, ur: NEGATIVE mg/dL
Protein, ur: NEGATIVE mg/dL
Protein, ur: NEGATIVE mg/dL
Specific Gravity, Urine: 1.025 (ref 1.005–1.030)
Specific Gravity, Urine: 1.02 (ref 1.005–1.030)
Urobilinogen, UA: 1 mg/dL (ref 0.0–1.0)
Urobilinogen, UA: 1 mg/dL (ref 0.0–1.0)

## 2010-09-02 LAB — DIFFERENTIAL
Eosinophils Absolute: 0.2 10*3/uL (ref 0.0–0.7)
Lymphs Abs: 2.8 10*3/uL (ref 0.7–4.0)
Monocytes Relative: 9 % (ref 3–12)
Neutro Abs: 3.2 10*3/uL (ref 1.7–7.7)
Neutrophils Relative %: 46 % (ref 43–77)

## 2010-09-02 LAB — BASIC METABOLIC PANEL
CO2: 24 mEq/L (ref 19–32)
Calcium: 8.7 mg/dL (ref 8.4–10.5)
GFR calc Af Amer: >60 mL/min (ref >60)
Potassium: 3.5 mEq/L (ref 3.5–5.1)
Sodium: 136 mEq/L (ref 135–145)

## 2010-09-02 LAB — GC/CHLAMYDIA PROBE AMP, GENITAL: GC Probe Amp, Genital: NEGATIVE

## 2010-09-02 LAB — URINE MICROSCOPIC-ADD ON

## 2010-09-03 ENCOUNTER — Inpatient Hospital Stay (HOSPITAL_COMMUNITY)
Admission: AD | Admit: 2010-09-03 | Discharge: 2010-09-03 | Disposition: A | Discharge status: Home / Self Care | Payer: Medicaid Other | Source: Ambulatory Visit | Attending: Obstetrics & Gynecology | Admitting: Obstetrics & Gynecology

## 2010-09-03 DIAGNOSIS — G43909 Migraine, unspecified, not intractable, without status migrainosus: Secondary | ICD-10-CM | POA: Insufficient documentation

## 2010-09-03 DIAGNOSIS — O4700 False labor before 37 completed weeks of gestation, unspecified trimester: Secondary | ICD-10-CM

## 2010-09-03 DIAGNOSIS — O9989 Other specified diseases and conditions complicating pregnancy, childbirth and the puerperium: Secondary | ICD-10-CM

## 2010-09-03 DIAGNOSIS — O99891 Other specified diseases and conditions complicating pregnancy: Secondary | ICD-10-CM | POA: Insufficient documentation

## 2010-09-03 LAB — URINALYSIS, ROUTINE W REFLEX MICROSCOPIC
Bilirubin Urine: NEGATIVE
Ketones, ur: NEGATIVE mg/dL
Nitrite: NEGATIVE
Protein, ur: NEGATIVE mg/dL
pH: 6.5 (ref 5.0–8.0)

## 2010-09-03 LAB — URINE MICROSCOPIC-ADD ON

## 2010-09-08 LAB — URINALYSIS, ROUTINE W REFLEX MICROSCOPIC
Hgb urine dipstick: NEGATIVE
Nitrite: NEGATIVE
Specific Gravity, Urine: 1.025 (ref 1.005–1.030)
Urobilinogen, UA: 1 mg/dL (ref 0.0–1.0)
pH: 7 (ref 5.0–8.0)

## 2010-09-08 LAB — COMPREHENSIVE METABOLIC PANEL
Albumin: 3.9 g/dL (ref 3.5–5.2)
BUN: 8 mg/dL (ref 6–23)
Calcium: 9 mg/dL (ref 8.4–10.5)
Creatinine, Ser: 0.7 mg/dL (ref 0.4–1.2)
Total Protein: 7.9 g/dL (ref 6.0–8.3)

## 2010-09-08 LAB — CBC
HCT: 34.7 % — ABNORMAL LOW (ref 36.0–46.0)
MCV: 93.1 fL (ref 78.0–100.0)
Platelets: 252 10*3/uL (ref 150–400)
RDW: 13.3 % (ref 11.5–15.5)

## 2010-09-08 LAB — URINE MICROSCOPIC-ADD ON

## 2010-09-08 LAB — DIFFERENTIAL
Basophils Absolute: 0 10*3/uL (ref 0.0–0.1)
Lymphocytes Relative: 19 % (ref 12–46)
Monocytes Absolute: 0.6 10*3/uL (ref 0.1–1.0)
Monocytes Relative: 6 % (ref 3–12)
Neutro Abs: 8.7 10*3/uL — ABNORMAL HIGH (ref 1.7–7.7)

## 2010-09-09 ENCOUNTER — Other Ambulatory Visit: Payer: Self-pay | Admitting: Obstetrics & Gynecology

## 2010-09-09 DIAGNOSIS — O09219 Supervision of pregnancy with history of pre-term labor, unspecified trimester: Secondary | ICD-10-CM

## 2010-09-09 LAB — POCT URINALYSIS DIP (DEVICE)
Ketones, ur: NEGATIVE mg/dL
Protein, ur: 30 mg/dL — AB
Specific Gravity, Urine: 1.025 (ref 1.005–1.030)

## 2010-09-15 ENCOUNTER — Ambulatory Visit: Payer: Medicaid Other

## 2010-09-22 LAB — POCT PREGNANCY, URINE: Preg Test, Ur: NEGATIVE

## 2010-09-30 ENCOUNTER — Inpatient Hospital Stay (HOSPITAL_COMMUNITY)
Admission: AD | Admit: 2010-09-30 | Discharge: 2010-09-30 | Disposition: A | Discharge status: Home / Self Care | Payer: Medicaid Other | Source: Ambulatory Visit | Attending: Obstetrics & Gynecology | Admitting: Obstetrics & Gynecology

## 2010-09-30 ENCOUNTER — Inpatient Hospital Stay (HOSPITAL_COMMUNITY): Payer: Medicaid Other

## 2010-09-30 ENCOUNTER — Other Ambulatory Visit: Payer: Self-pay | Admitting: Obstetrics & Gynecology

## 2010-09-30 DIAGNOSIS — Z331 Pregnant state, incidental: Secondary | ICD-10-CM

## 2010-09-30 DIAGNOSIS — O169 Unspecified maternal hypertension, unspecified trimester: Secondary | ICD-10-CM

## 2010-09-30 DIAGNOSIS — O30009 Twin pregnancy, unspecified number of placenta and unspecified number of amniotic sacs, unspecified trimester: Secondary | ICD-10-CM

## 2010-09-30 DIAGNOSIS — O139 Gestational [pregnancy-induced] hypertension without significant proteinuria, unspecified trimester: Secondary | ICD-10-CM

## 2010-09-30 DIAGNOSIS — O34219 Maternal care for unspecified type scar from previous cesarean delivery: Secondary | ICD-10-CM

## 2010-09-30 LAB — POCT URINALYSIS DIP (DEVICE)
Ketones, ur: NEGATIVE mg/dL
Protein, ur: NEGATIVE mg/dL
Specific Gravity, Urine: 1.015 (ref 1.005–1.030)

## 2010-09-30 LAB — URINALYSIS, ROUTINE W REFLEX MICROSCOPIC
Ketones, ur: NEGATIVE mg/dL
Nitrite: NEGATIVE
Protein, ur: NEGATIVE mg/dL
Urobilinogen, UA: 0.2 mg/dL (ref 0.0–1.0)
pH: 7 (ref 5.0–8.0)

## 2010-09-30 LAB — COMPREHENSIVE METABOLIC PANEL
Albumin: 2.7 g/dL — ABNORMAL LOW (ref 3.5–5.2)
BUN: 5 mg/dL — ABNORMAL LOW (ref 6–23)
Creatinine, Ser: 0.65 mg/dL (ref 0.4–1.2)
Glucose, Bld: 78 mg/dL (ref 70–99)
Total Protein: 6.1 g/dL (ref 6.0–8.3)

## 2010-09-30 LAB — URIC ACID: Uric Acid, Serum: 5.2 mg/dL (ref 2.4–7.0)

## 2010-09-30 LAB — RAPID URINE DRUG SCREEN, HOSP PERFORMED
Barbiturates: NOT DETECTED
Opiates: NOT DETECTED
Tetrahydrocannabinol: NOT DETECTED

## 2010-09-30 LAB — CBC
MCH: 27.9 pg (ref 26.0–34.0)
MCHC: 31.8 g/dL (ref 30.0–36.0)
MCV: 87.6 fL (ref 78.0–100.0)
Platelets: 258 10*3/uL (ref 150–400)
RDW: 13.2 % (ref 11.5–15.5)

## 2010-09-30 LAB — PROTEIN / CREATININE RATIO, URINE
Creatinine, Urine: 70 mg/dL
Total Protein, Urine: 14 mg/dL

## 2010-09-30 LAB — WET PREP, GENITAL
Trich, Wet Prep: NONE SEEN
Yeast Wet Prep HPF POC: NONE SEEN

## 2010-09-30 IMAGING — US US OB FOLLOW-UP
1 of 2 series · 12 of 28 positions shown · non-contrast
Comparison: none

[Series 1: us ob follow up · 12 of 29 slices shown]
[im 1/29]
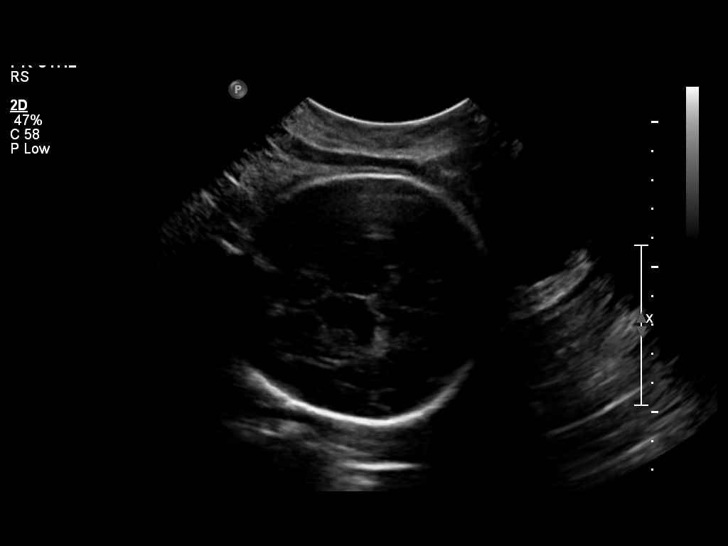
[im 3/29]
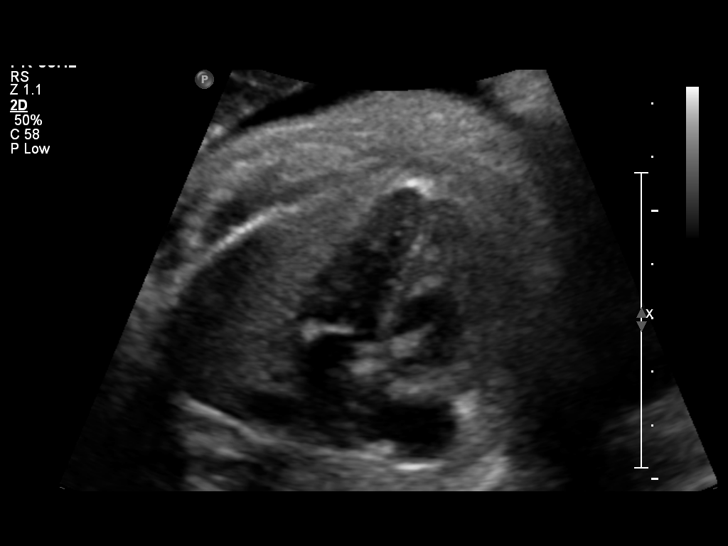
[im 5/29]
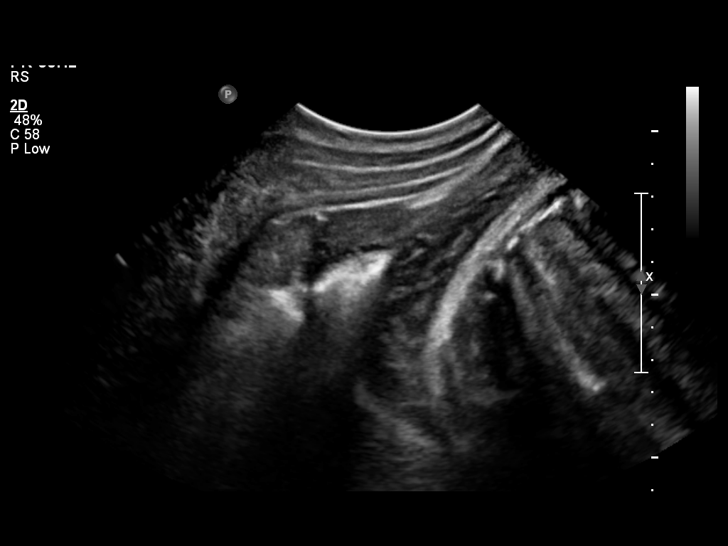
[im 8/29]
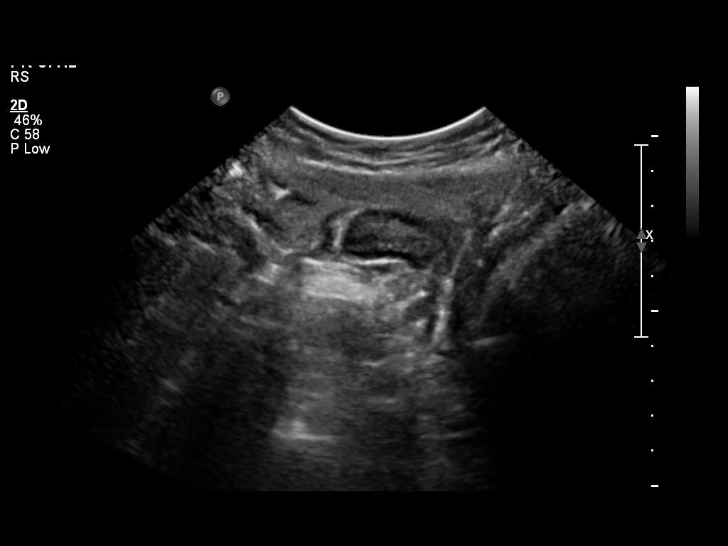
[im 10/29]
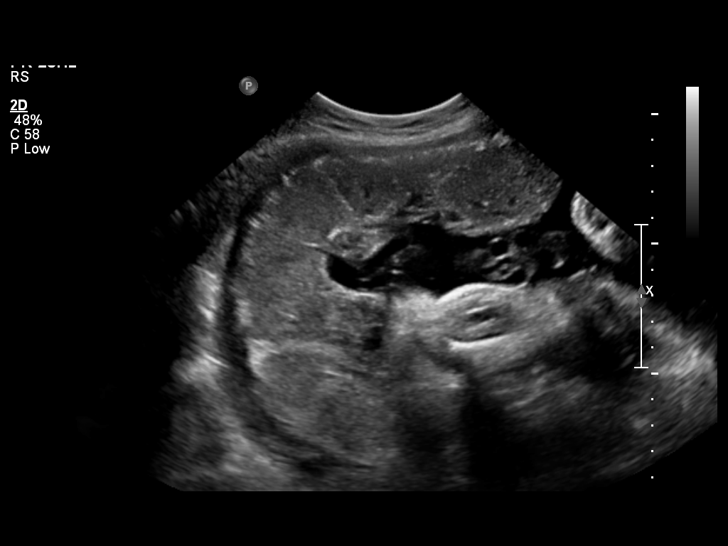
[im 12/29]
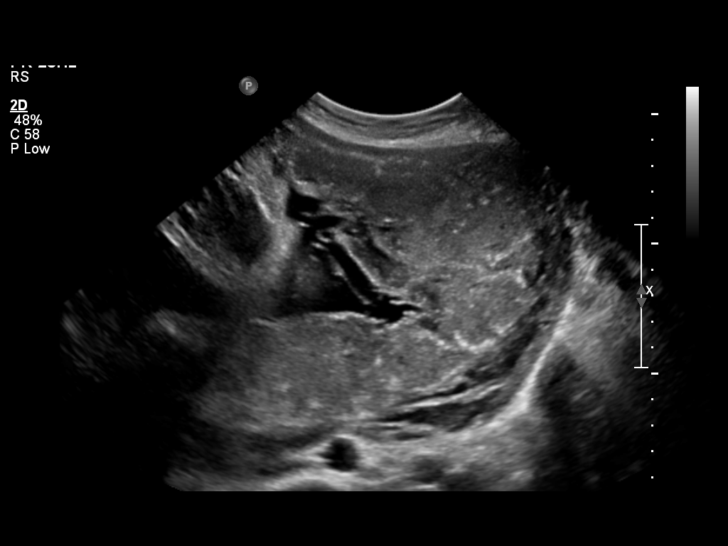
[im 16/29]
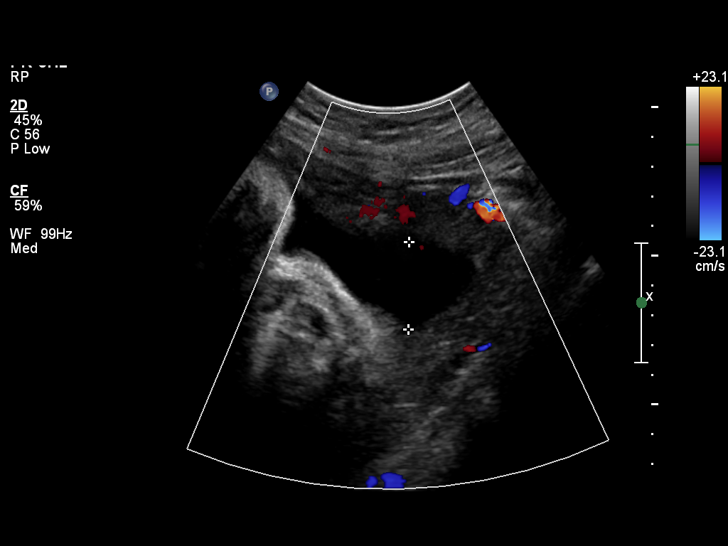
[im 18/29]
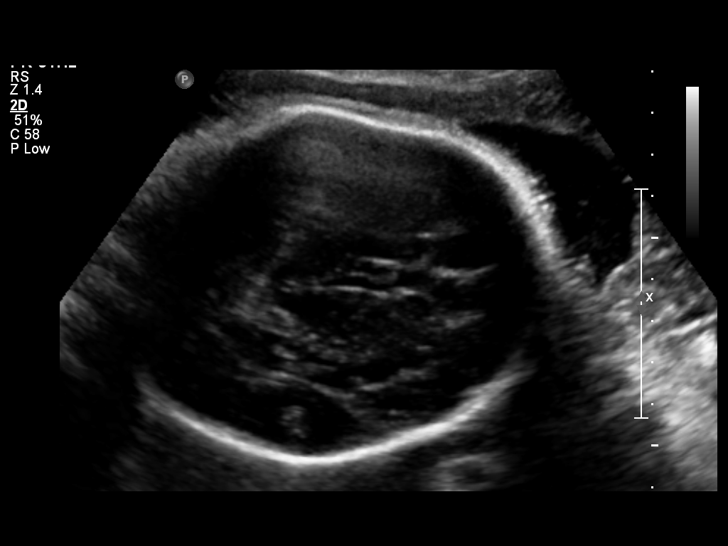
[im 20/29]
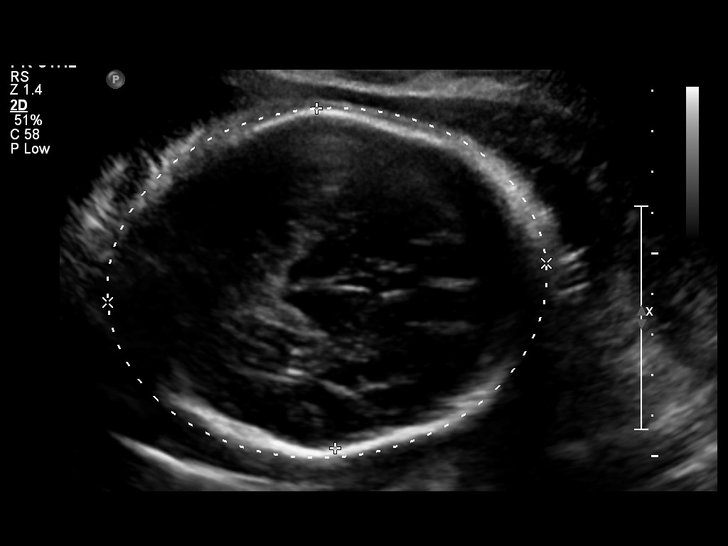
[im 23/29]
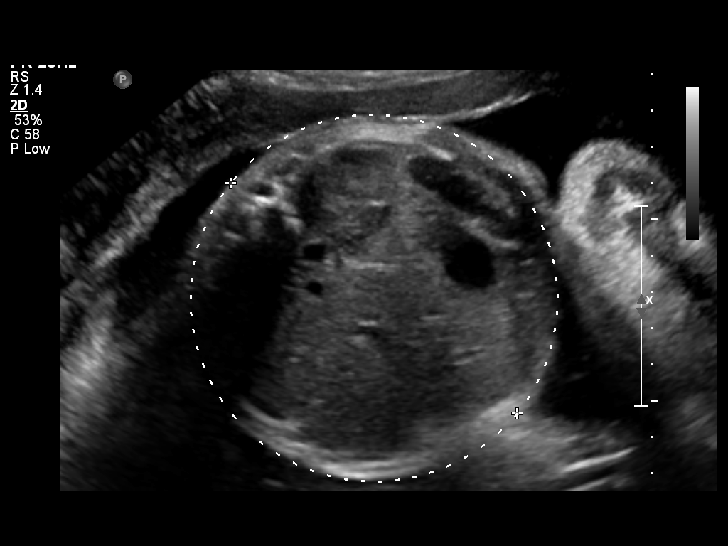
[im 25/29]
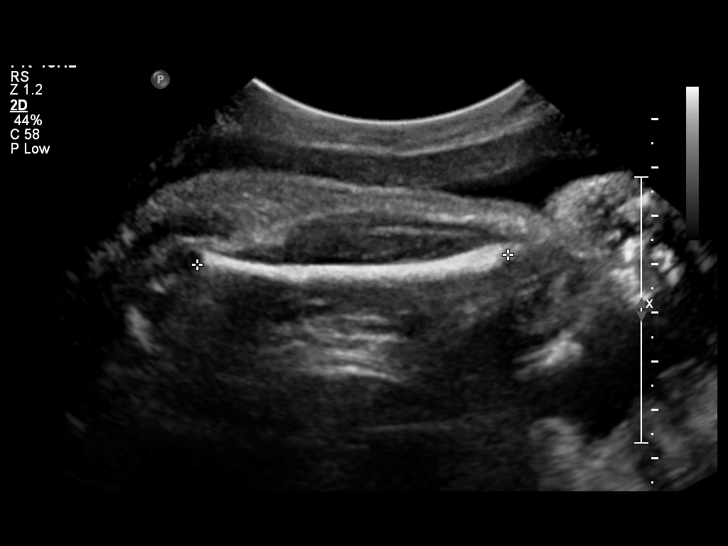
[im 27/29]
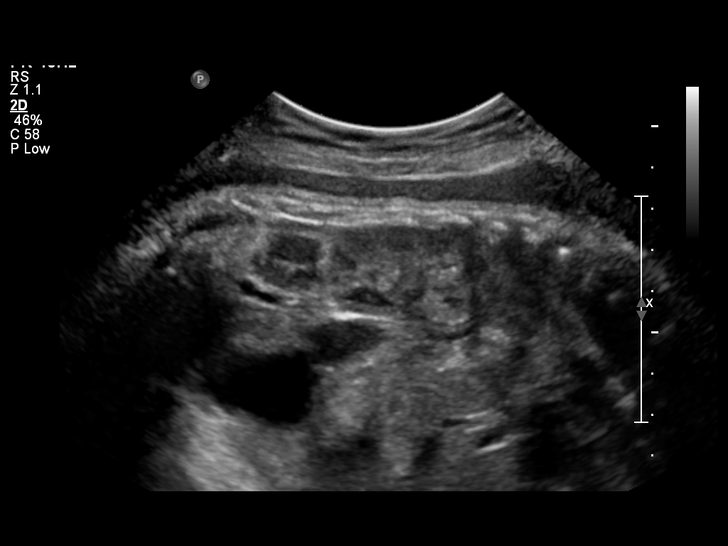

[12 of 28 positions shown; findings below may reference images not displayed]

OBSTETRICS REPORT
                      (Signed Final [DATE] [DATE])

                 MAU/Triage
Procedures

 US OB FOLLOW UP                                       76816.1
Indications

 Hypertension - Gestational
 Poor obstetric history: Previous preterm delivery     [9K]
 (36 weeks x 2)
 Cigarette smoker
 Poor obstetric history: Previous preeclampsia /       [9K]
 eclampsia/gestational HTN
 Previous cesarean section

Fetal Evaluation (Fetus B)

 Fetal Heart Rate:  128                          bpm
 Cardiac Activity:  Observed
 Presentation:      Cephalic
 Placenta:          Fundal, above cervical os
 P. Cord            Previously Visualized
 Insertion:

 Amniotic Fluid
 AFI FV:      Subjectively within normal limits
 AFI Sum:     16.4    cm       60  %Tile     Larg Pckt:    7.02  cm
 RUQ:   7.02    cm   RLQ:    3.76   cm    LUQ:   2.93    cm   LLQ:    2.69   cm
Biometry (Fetus B)

 BPD:     82.9  mm     G. Age:  33w 3d                CI:        74.18   70 - 86
                                                      FL/HC:      20.9   20.1 -

 HC:     305.6  mm     G. Age:  34w 0d      < 3  %    HC/AC:      0.96   0.93 -

 AC:     317.2  mm     G. Age:  35w 4d       53  %    FL/BPD:     77.1   71 - 87
 FL:      63.9  mm     G. Age:  33w 0d      < 3  %    FL/AC:      20.1   20 - 24

 Est. FW:    [9K]  gm      5 lb 7 oz     29  %
Gestational Age (Fetus B)
 LMP:           36w 6d        Date:  [DATE]                 EDD:   [DATE]
 U/S Today:     34w 0d                                        EDD:   [DATE]
 Best:          35w 6d     Det. By:  U/S C R L Fetus B        EDD:   [DATE]
                                     ([DATE])
Anatomy (Fetus B)

 Cranium:           Appears normal      Aortic Arch:       Basic anatomy
                                                           exam per order
 Fetal Cavum:       Appears normal      Ductal Arch:       Basic anatomy
                                                           exam per order
 Ventricles:        Appears normal      Diaphragm:         Previously seen
 Choroid Plexus:    Previously seen     Stomach:           Appears normal
 Cerebellum:        Previously seen     Abdomen:           Previously seen
 Posterior Fossa:   Previously seen     Abdominal Wall:    Previously seen
 Nuchal Fold:       Previously seen     Cord Vessels:      Previously seen
 Face:              Lips previously     Kidneys:           Appear normal
                    seen
 Heart:             Appears normal      Bladder:           Appears normal
                    (4 chamber &
                    axis)
 RVOT:              Basic anatomy       Spine:             Previously seen
                    exam per order
 LVOT:              Basic anatomy       Limbs:             Previously seen
                    exam per order
Cervix Uterus Adnexa

 Cervix:       Not visualized (advanced GA >34 wks)

 Left Ovary:    Within normal limits.
 Right Ovary:   Within normal limits.
 Adnexa:     No abnormality visualized.
Impression

 Single intrauterine gestation demonstrating an estimated
 gestational age by ultrasound of 34w 0d. This is correlated
 with expected estimated gestational age by early ultrasound
 of 35w 6d. EFW is currently at the 29%. While this represents
 a downward trend since the prior exam, the AC is not
 decreasing in size and EFW is influenced by relatively small
 HC and FL. This does not likely reflect developing IUGR.

 No late developing fetal anatomic abnormalities are noted
 associated with the lateral ventricles, four chamber heart,
 stomach, kidneys or bladder.

 Subjectively and quantitatively normal amniotic fluid volume.

## 2010-10-01 LAB — GC/CHLAMYDIA PROBE AMP, GENITAL: Chlamydia, DNA Probe: NEGATIVE

## 2010-10-02 LAB — STREP B DNA PROBE

## 2010-10-04 ENCOUNTER — Other Ambulatory Visit: Payer: Medicaid Other

## 2010-10-04 DIAGNOSIS — O169 Unspecified maternal hypertension, unspecified trimester: Secondary | ICD-10-CM

## 2010-10-05 ENCOUNTER — Inpatient Hospital Stay (HOSPITAL_COMMUNITY)
Admission: AD | Admit: 2010-10-05 | Discharge: 2010-10-12 | DRG: 765 | Disposition: A | Discharge status: Home / Self Care | Payer: Medicaid Other | Source: Ambulatory Visit | Attending: Obstetrics & Gynecology | Admitting: Obstetrics & Gynecology

## 2010-10-05 DIAGNOSIS — O1002 Pre-existing essential hypertension complicating childbirth: Secondary | ICD-10-CM | POA: Diagnosis present

## 2010-10-05 DIAGNOSIS — Z302 Encounter for sterilization: Secondary | ICD-10-CM

## 2010-10-05 DIAGNOSIS — O34219 Maternal care for unspecified type scar from previous cesarean delivery: Principal | ICD-10-CM | POA: Diagnosis present

## 2010-10-05 LAB — URINE MICROSCOPIC-ADD ON

## 2010-10-05 LAB — COMPREHENSIVE METABOLIC PANEL
ALT: 17 U/L (ref 0–35)
AST: 24 U/L (ref 0–37)
Alkaline Phosphatase: 187 U/L — ABNORMAL HIGH (ref 39–117)
CO2: 22 mEq/L (ref 19–32)
Chloride: 108 mEq/L (ref 96–112)
GFR calc Af Amer: >60 mL/min (ref >60)
GFR calc non Af Amer: >60 mL/min (ref >60)
Glucose, Bld: 101 mg/dL — ABNORMAL HIGH (ref 70–99)
Potassium: 3.7 mEq/L (ref 3.5–5.1)
Sodium: 137 mEq/L (ref 135–145)
Total Bilirubin: 0.3 mg/dL (ref 0.3–1.2)

## 2010-10-05 LAB — URINALYSIS, ROUTINE W REFLEX MICROSCOPIC: pH: 6 (ref 5.0–8.0)

## 2010-10-05 LAB — CBC
HCT: 28.8 % — ABNORMAL LOW (ref 36.0–46.0)
Hemoglobin: 9.2 g/dL — ABNORMAL LOW (ref 12.0–15.0)
RBC: 3.3 MIL/uL — ABNORMAL LOW (ref 3.87–5.11)

## 2010-10-05 LAB — PROTEIN / CREATININE RATIO, URINE
Creatinine, Urine: 109.5 mg/dL
Total Protein, Urine: 32 mg/dL

## 2010-10-07 ENCOUNTER — Other Ambulatory Visit: Payer: Medicaid Other

## 2010-10-07 DIAGNOSIS — Z302 Encounter for sterilization: Secondary | ICD-10-CM

## 2010-10-07 DIAGNOSIS — O34219 Maternal care for unspecified type scar from previous cesarean delivery: Secondary | ICD-10-CM

## 2010-10-07 DIAGNOSIS — O1002 Pre-existing essential hypertension complicating childbirth: Secondary | ICD-10-CM

## 2010-10-07 LAB — PROTEIN, URINE, 24 HOUR
Collection Interval-UPROT: 24 hours
Protein, Urine: 3 mg/dL
Urine Total Volume-UPROT: 7200 mL

## 2010-10-07 LAB — CREATININE, URINE, 24 HOUR: Creatinine, Urine: 21.9 mg/dL

## 2010-10-09 ENCOUNTER — Other Ambulatory Visit: Payer: Self-pay | Admitting: Obstetrics and Gynecology

## 2010-10-09 DIAGNOSIS — O1002 Pre-existing essential hypertension complicating childbirth: Secondary | ICD-10-CM

## 2010-10-09 DIAGNOSIS — Z302 Encounter for sterilization: Secondary | ICD-10-CM

## 2010-10-09 DIAGNOSIS — O34219 Maternal care for unspecified type scar from previous cesarean delivery: Secondary | ICD-10-CM

## 2010-10-09 LAB — CBC
MCH: 27.5 pg (ref 26.0–34.0)
Platelets: 234 10*3/uL (ref 150–400)
RBC: 3.13 MIL/uL — ABNORMAL LOW (ref 3.87–5.11)
RDW: 13.8 % (ref 11.5–15.5)

## 2010-10-09 LAB — MRSA PCR SCREENING: MRSA by PCR: NEGATIVE

## 2010-10-10 LAB — CBC
Hemoglobin: 8.2 g/dL — ABNORMAL LOW (ref 12.0–15.0)
MCH: 27.5 pg (ref 26.0–34.0)
MCHC: 31.4 g/dL (ref 30.0–36.0)
Platelets: 254 10*3/uL (ref 150–400)
RDW: 14.1 % (ref 11.5–15.5)

## 2010-10-11 ENCOUNTER — Other Ambulatory Visit: Payer: Medicaid Other

## 2010-10-11 LAB — RPR: RPR Ser Ql: NONREACTIVE

## 2010-10-11 LAB — RH IMMUNE GLOB WKUP(>/=20WKS)(NOT WOMEN'S HOSP)
Fetal Screen: NEGATIVE
Unit division: 0

## 2010-10-13 NOTE — Op Note (Addendum)
NAMECAROLE, DONER               ACCOUNT NO.:  [id number]  MEDICAL RECORD NO.:  [id number]           PATIENT TYPE:  I  LOCATION:  9371                          FACILITY:  [facility]  PHYSICIAN:  Catalina Antigua, MD     DATE OF BIRTH:  02-19-1981  DATE OF PROCEDURE:  10/09/2010 DATE OF DISCHARGE:                              OPERATIVE REPORT   PREOPERATIVE DIAGNOSES:  A 30 year old gravida 23, para 1-2-5-3, who is at 37/1 weeks' pregnancy.  She has chronic hypertension which is an indication for delivery at 37 weeks.  An induction of labor was started yesterday and she decided that she did not want to do a vaginal birth after cesarean section and opted for a repeat cesarean section.  She also wanted a tubal ligation since she has undesired fertility.  PROCEDURE:  Repeat low-transverse C-section and bilateral tubal ligation by Pomeroy method.  SURGEONS: 1. Catalina Antigua, MD 2. Lucina Mellow, DO  ANESTHESIA:  Spinal.  COMPLICATIONS:  None.  ESTIMATED BLOOD LOSS:  800.  FLUIDS:  2000.Marland Kitchen  URINE OUTPUT:  200 and clear.  FINDINGS:  A female infant in vertex presentation, weight 5 pounds 6 ounces, Apgars 7 at 1 minute and 9 at 5 minutes.  Normal uterus, tubes, ovaries, and placenta.  Specimen sent to Labor and Delivery.  PROCEDURE IN DETAIL:  The patient was taken to the operating room where spinal anesthesia was found to be adequate.  She was prepared and draped in the normal sterile fashion in the dorsal supine position with a leftward tilt.  A Pfannenstiel skin incision was made with a scalpel and carried through to the underlying fascia with a Bovie and a knife.  The fascia was incised in the midline and the excision was extended laterally with a Mayo scissors.  The superior aspect of the fascial incision was then grasped with Kocher clamps, elevated, and the rectal muscles were dissected off bluntly.  Attention was then turned to the inferior aspect of this incision which  in a similar fashion was grasped, tented up with Kocher clamps, and the rectus muscle dissected off bluntly.  The rectus muscles were then separated in the midline and the peritoneum was identified, tented up, and entered bluntly with hemostats and digital dissection.  The peritoneal incision and the rectus muscles were then stretched laterally with traction.  An Alexis retractor was placed.  The vesicouterine peritoneum was identified, grasped with the pickups, and entered sharply with the Metzenbaum scissors.  The incision was extended laterally and the bladder flap was created digitally.  The bladder blade was then inserted and the lower uterine segment was incised in a transverse and upwardly fashion with the scalpel.  The uterine incision was then extended laterally with the bandage scissors. The bladder blade was removed and the infant's head was delivered atraumatically.  The nose and mouth were suctioned on the field and the cord was clamped and cut and cord blood sample was obtained.  The infant was handed to the awaiting pediatrician.  The placenta was then removed manually with massage and traction.  The uterus was cleared of all clots and  debris.  The uterine incision was repaired with Vicryl in a running locked fashion.  A second layer of the same suture was used to obtain excellent hemostasis with an imbricating layer.  Bladder flap was not repaired.  The gutters were cleared of all clots and irrigation occurred.  The peritoneum was evaluated for bleeding.  Attention was then turned to the patient's left fallopian tube.  It was brought out to the side in the field with Babcock clamp, it was followed out to the fimbriae.  A Babcock clamp was used to grab the tube approximately 4 cm from the cornual region and a free tie of plain gut was used to ligate a 3-cm segment of the tube.  A second plain gut suture was used to tie off the tube.  The tube was excised and passed off to be  sent to pathology. Good hemostasis was noted and the tube was returned to the abdomen.  The right fallopian tube was then identified in a similar fashion and a 3-cm segment was excised similarly.  Excellent hemostasis was noted and this tube also was returned to the abdomen.  The peritoneum was closed with 3- 0 Vicryl.  The fascia was then reapproximated with 0 Vicryl in a running fashion.  The skin was closed with staples.  The patient tolerated the procedure well.  Sponge, lap, and needle counts were correct x3.  Ancef 2 g were given prior to the procedure.  The patient was taken to the recovery room in stable condition.    ______________________________ Lucina Mellow, DO   ______________________________ Catalina Antigua, MD    SH/MEDQ  D:  10/09/2010  T:  10/09/2010  Job:  [id number]  Electronically Signed by Lucina Mellow MD on 10/13/2010 03:59:39 PM Electronically Signed by Catalina Antigua  on 10/13/2010 05:07:36 PM

## 2010-10-21 ENCOUNTER — Ambulatory Visit: Payer: Medicaid Other

## 2010-10-22 ENCOUNTER — Ambulatory Visit: Payer: Medicaid Other

## 2010-11-02 NOTE — Discharge Summary (Signed)
Kayla Dunn, Kayla Dunn               ACCOUNT NO.:  [id number]   MEDICAL RECORD NO.:  [id number]          PATIENT TYPE:  INP   LOCATION:  9156                          [facility]ACILITY:  [facility]   PHYSICIAN:  Allie Bossier, MD        DATE OF BIRTH:  Nov 18, 1980   DATE OF ADMISSION:  02/02/2007  DATE OF DISCHARGE:  02/03/2007                               DISCHARGE SUMMARY   ADMISSION DIAGNOSIS:  1. Threatened preterm labor.  2. Intrauterine pregnancy at 32+ weeks gestation.   DISCHARGE DIAGNOSIS:  1. Threatened preterm labor.  2. Intrauterine pregnancy at 63 weeks' gestation.   PERTINENT LABS:  Blood type O negative, Rh positive. Ultrasound done on  August 15 showed baby in cephalic position, AFI of 17, estimated fetal  weight of 2376 grams which places the baby at the 79th percentile.  Cervix is 2.2 cm.   HOSPITAL COURSE:  This is a 30 year old gravida 6, para 2-0-3-2 at the  32 weeks 6 days gestation who presented with vaginal bleeding after  intercourse. The patient also complained of contractions that started  shortly after intercourse coming every 5 minutes.  She presented to  maternity admissions several hours later when bleeding persisted. Cervix  was found to be 3-4 cm dilated and the patient contracting every 3-4  minutes on the monitor. She was given terbutaline 0.25 mg subcu x1  followed by Procardia 10 mg q.20 minutes x4 doses, then started on  Procardia XL 30 mg b.i.d.  She was also given a course of steroids which  was betamethasone 12.5 mg IM x1 then repeated in 24 hours. The GBS was  collected and at the time of discharge was pending. The patient's  contractions stopped.  Fetal heart tracings looked reactive and  reassuring throughout her hospital course. By hospital day number #2 the  patient had very minimal cramping. There was the occasional contraction  noted on the monitor.  Cervix was checked and found to be a loose 1, 20-  30% effaced, and a fetal station high.  Cervix  remained stable for  greater than 4 hours and she was felt to be stable for discharge home.   DISCHARGE INSTRUCTIONS:  Included preterm labor precautions.  She is to  maintain bed rest with very light activity, up to eat and shower only,  pelvic rest including no intercourse. She is also to follow up at high-  risk clinic on Monday August 18 for repeat cervical check.   DISCHARGE MEDICATIONS:  1. Procardia XL 30 mg p.o. b.i.d.  2. Prenatal vitamins daily.     ______________________________  Kayla Stack, MD      Allie Bossier, MD  Electronically Signed    LNJ/MEDQ  D:  02/05/2007  T:  02/05/2007  Job:  [id number]

## 2010-11-02 NOTE — Op Note (Signed)
NAME:  Kayla Dunn, Kayla Dunn NO.:  [id number]   MEDICAL RECORD NO.:  [id number]          PATIENT TYPE:  WOC   LOCATION:  WOC                          FACILITY:  [facility]   PHYSICIAN:  Tilda Burrow, M.D. DATE OF BIRTH:  01-05-1981   DATE OF PROCEDURE:  02/26/2007  DATE OF DISCHARGE:                               OPERATIVE REPORT   [name] progressed to completely dilated at 3:15 a.m., progressed  rapidly after 9 minutes second stage to delivery over an intact perineum  without lacerations, spontaneous vertex, vaginal delivery of  5 pound 13  ounce infant female, Apgars 9 and 9, delivered without complications.  Placenta delivered intact, [name] presentation.  She delivered  immediately upon arrival and received 10 units Pitocin IM immediately  after delivery with EBL less than 500 mL.  Uncomplicated delivery.      Tilda Burrow, M.D.  Electronically Signed     JVF/MEDQ  D:  02/26/2007  T:  02/26/2007  Job:  [id number]

## 2010-11-02 NOTE — Op Note (Signed)
NAMENORINE, REDDINGTON NO.:  [id number]   MEDICAL RECORD NO.:  [id number]          PATIENT TYPE:  INP   LOCATION:  9113                          [facility]ILITY:  [facility]   PHYSICIAN:  Tilda Burrow, M.D. DATE OF BIRTH:  [birth year]   DATE OF PROCEDURE:  02/24/2007  DATE OF DISCHARGE:                               OPERATIVE REPORT   DELIVERY NOTE:  At 3:20 a.m., this Kayla Dunn patient, gravida 6, para 2-  0-3-2, due date October 4, is admitted at 36 weeks actively laboring.  She presented to labor and delivery and gush of fluid immediately upon  arrival.  She was quite quickly placed in a bed, examined, and found to  be completely dilated.  She pushed and delivered within 2 minutes of  arrival to labor and delivery, delivering over an intact perineum a  healthy female infant, 5 pounds 12 ounces, Apgars 9 and 9 with clear  amniotic fluid.  Blood samples on the infant were obtained, placenta  delivered intact, Kayla Dunn presentation, three-vessel cord.  Mother and  infant did well with 10 units of Pitocin IM administered due to lack of  IV access.      Tilda Burrow, M.D.  Electronically Signed     JVF/MEDQ  D:  02/24/2007  T:  02/24/2007  Job:  [id number]   cc:   Teaching Service

## 2010-11-05 NOTE — Group Therapy Note (Signed)
NAMENYASIA, Kayla Dunn NO.:  [id number]   MEDICAL RECORD NO.:  [id number]          PATIENT TYPE:  WOC   LOCATION:  [facility]                   FACILITY:  [facility]   PHYSICIAN:  Elsie Lincoln, MD      DATE OF BIRTH:  01-Mar-1981   DATE OF SERVICE:  01/04/2005                                    CLINIC NOTE   The patient is a 30 year old G5, para 2-0-3-2 female, LMP December 13, 2004.  Presents for follow-up from [facility].  She was seen in the ED on December 28, 2004 for abdominal pain.  She thinks it is her ovary.  They did not do an  ultrasound or CT at the time.  She was placed on Naproxen for pain.  The  patient denies any nausea, vomiting, diarrhea, constipation, transient  urinary habits.  She has had a teratoma removed in 2004 and this pulling on  the right ovary feels exactly the way it did when she had the last cyst.  Patient wants a transvaginal ultrasound.  Patient does not complain of any  discharge.  She is currently sexually active and would like to become  pregnant, however, is going to wait a year and a half until she gets  married.   PAST MEDICAL HISTORY:  Allergic rhinitis and migraines.   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   PAST OBSTETRICAL HISTORY:  NSVD x1, cesarean section x1, two SABs and one  TAB.   PAST GYNECOLOGICAL HISTORY:  Menarche age 35, __________ 2001.   MEDICATIONS:  Naproxen.   ALLERGIES:  ASPIRIN and MORPHINE.   PHYSICAL EXAMINATION:  GENERAL:  Well-nourished, well-developed, no apparent  distress.  VITAL SIGNS:  Temperature 98.3, pulse 76, blood pressure 133/81, weight  169.3, height 5 feet 2 inches.  ABDOMEN:  Two well healed Pfannenstiel scars.  Obese.  Soft, nontender,  nondistended.  No rebound.  No guarding.  PELVIC:  Genitalia Tanner V.  Bimanual examination reveals a small uterus, a  questionably right adnexal fullness.  Left adnexa:  No masses  palpated and  nontender.  Patient tolerated extremely deep examination with no wincing or  evidence of pain.   ASSESSMENT/PLAN:  A 30 year old female with right lower quadrant pain.  1.  Transvaginal ultrasound.  2.  Patient return in two weeks for evaluation results.  3.  Pap smear due in September.  4.  If GC/Chlamydia was not done at [facility] we need to do that next      visit.       KL/MEDQ  D:  01/04/2005  T:  01/05/2005  Job:  [id number]

## 2010-11-05 NOTE — Discharge Summary (Signed)
NAME:  Kayla Dunn, Kayla Dunn                         ACCOUNT NO.:  [id number]   MEDICAL RECORD NO.:  [id number]                   PATIENT TYPE:  INP   LOCATION:  9304                                 FACILITY:  [facility]   PHYSICIAN:  Mary Sella. Orlene Erm, M.D.                 DATE OF BIRTH:  07/26/1980   DATE OF ADMISSION:  08/14/2002  DATE OF DISCHARGE:  08/16/2002                                 DISCHARGE SUMMARY   PRINCIPAL DIAGNOSIS:  A 30 year old gravida 4, para 2-0-2-2 black female  with complex adnexal mass.   ADDITIONAL DIAGNOSIS:  History of low grade SIL Pap smear.   HISTORY OF PRESENT ILLNESS:  The patient is a 30 year old G31, P4 black  female who is referred with history of abnormal Pap and ovarian cyst.  The  patient underwent a Pap smear postpartum and had a low grade SIL.  In  addition she began having some abdominal pain and underwent an ultrasound  which revealed a 7.3 x 4.1 x 3.7 right ovarian cyst which was consistent  with a right ovarian dermoid.  The patient underwent colposcopy which had  negative biopsies and was scheduled for exploratory laparotomy in March.  The patient returned to [facility] on August 13, 2002, with  increasing abdominal pain.  Although her examination and laboratory findings  were benign, decision was made to perform her surgery the following day  secondary to her increase in pain.   HOSPITAL COURSE:  The patient was admitted on August 14, 2002, and  underwent exploratory laparotomy with a mini-laparotomy incision and right  ovarian cystectomy.  At the time of surgery, the cyst was removed intact and  found to have benign characteristics of teeth, hair and sebaceous material.  The ovary was preserved.   The patient did extremely well operatively and returned to the recovery room  in stable condition.  On postoperative day #0, she was doing well with good  pain control.  She was afebrile and her vital signs were stable.   On postoperative  day #1, the patient had a low grade fever of 100.6 during  the night; however, during the day she was afebrile.  Physical examination  was benign.  Her abdomen was soft and nondistended and she had excellent  bowel sounds.  The patient was passing flatus by postoperative day #1.  The  bandage was removed and incision was clean, dry and intact.  The patient was  begun on a regular diet.  She was given p.o. pain medications and encouraged  to ambulate in the hallways.  The patient was seen in the evening on  postoperative day #1 and continued to do very well.  She was ambulating well  and she would continue to be afebrile.   On postoperative day #2, the patient complained of some mild nausea when  trying to eat her sweet roll.  Otherwise she was doing well.  She continued  to have flatus.  She was ambulating outside by herself to smoke the evening  prior.  Physical examination was benign. Specifically, her abdomen was soft,  nondistended and had positive bowel sounds.  Her incision was clean, dry and  intact.  Decision was made to remove her staples and place Steri-Strips  across the incision.  The decision was made to discharge the patient home at  lunch if she continued to tolerate p.o. well.  If she continues to have any  nausea, will extend her observation.   Her postoperative hemoglobin was 10.9, hematocrit 31.3, platelets 288, wbc  8.0.   CONDITION ON DISCHARGE:  The patient is to be discharged to home in good  condition.   DISCHARGE DIET:  Regular.   ACTIVITY:  No heavy lifting more than 10 pounds.   WOUND CARE:  She is to keep incision clean and dry.   DISCHARGE INSTRUCTIONS:  She is instructed extensively to return to  maternity admissions at [facility] for any fever, chills, nausea,  vomiting, severe pain.  She was to be given a discharge instruction booklet.   FOLLOW UP:  She is to follow up with me in one week at the [facility]  clinic to review pathology  and evaluate her incision.   DISCHARGE MEDICATIONS:  Percocet and Motrin which the patient was given  preoperatively.                                               Mary Sella. Orlene Erm, M.D.    EMH/MEDQ  D:  08/16/2002  T:  08/16/2002  Job:  [id number]

## 2010-11-05 NOTE — Discharge Summary (Signed)
[facility]  Patient:    Kayla Dunn, Kayla Dunn Visit Number: [id number] MRN: [id number]          Service Type: OBS Location: 910D 9122 01 Attending Physician:  Amada Kingfisher. Dictated by:   Asencion Partridge Neva Seat, M.D. Admit Date:  12/05/2001 Discharge Date: 12/13/2001   CC:         [facility]   Discharge Summary  ADDENDUM to job [phone].  On the day of discharge, finally received a copy of the patients outside ultrasound from [facility] [employer] [facility]  Test date was October 02, 2001, for anatomy and dates.  This was gestational age [redacted] weeks, it was complete ultrasound with an [facility] of February 12, 2002, determined by LMP. Ultrasound indicated an intrauterine pregnancy with a breech presentation, anterior placenta, normal amniotic fluid volume, observed fetal movement, HC/AC was 1.12, femur length over AC was 0.21, normal range.  Ultrasound gestational age was 20 weeks 4 days, fetal weight was 378 grams which is at 40th percentile with an [facility] of February 17, 2002.  Fetal anatomy appeared normal. Cardiac outflow tracts evaluated and appeared to be normal configuration.  Diagnosis of intrauterine pregnancy at 21 weeks.  A copy of this ultrasound is placed in the patients hospital chart. Dictated by:   Asencion Partridge Neva Seat, M.D. Attending Physician:  Amada Kingfisher. DD:  12/13/01 TD:  12/15/01 Job: [id number]

## 2010-11-05 NOTE — Op Note (Signed)
NAME:  Kayla Dunn, Kayla Dunn                         ACCOUNT NO.:  [id number]   MEDICAL RECORD NO.:  [id number]                   PATIENT TYPE:  INP   LOCATION:  9399                                 [facility]ILITY:  [facility]   PHYSICIAN:  Mary Sella. Orlene Erm, M.D.                 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   PROCEDURE:  Right ovarian cystectomy.   SURGEON:  Mary Sella. Orlene Erm, M.D.   ANESTHESIA:  General endotracheal.   COMPLICATIONS:  None.   SPECIMENS:  Benign appearing dermoid cyst with sebaceous material and  calcifications.   ESTIMATED BLOOD LOSS:  Approximately 30 mL.   DISPOSITION:  Recovery room.   FINDINGS:  Normal appearing left tube and ovary.  Normal appearing uterus  and a right approximately 6-7 cm right ovarian mass.   INDICATIONS FOR PROCEDURE:  The patient is a 30 year old gravida 4, para 2-0-  2-2 who was seen by me in the GYN Clinic.  At that time she was found to  have a right ovarian mass that was consistent on ultrasound with benign  teratoma.  She at that time had an abnormal Pap smear and underwent  colposcopy with biopsies.  Biopsies came back benign; however, she is  continuing to be followed with repeat Pap smears for this finding.  The  patient was scheduled then for right ovarian cystectomy for this dermoid.  However, scheduled OR date was not until March 7.  The patient presented to  the [facility] complaining of worsening pain and  some nausea.  She was seen the day before surgery and found to have a benign  abdomen with good bowel sounds and tolerating  p.o. well.  Decision was made  to perform her surgery the following day secondary to her worsening pain.   PROCEDURE:  The patient was taken to the operating room.  She was placed  under general endotracheal anesthesia.  She was prepped and draped in a  sterile fashion.  A mini laparotomy incision 6 cm in length was performed  with a scalpel.  The incision was carried down to the underlying fascia with  Bovie.  The fascia was entered sharply and dissected laterally with Mayo  scissors.  The rectus muscle bellies were separated from the fascia with  sharp and blunt dissection.  The peritoneum was identified and entered  bluntly with the surgeon's fingers.  The peritoneum and scar from her  previous cesarean section were dissected sharply.  The surgeon's hands were  used to explore the pelvis and  the left ovary and tube were noted to be  normal.  The uterus was noted to be normal as well.  The right ovary was  quite enlarged and brought through the abdominal incision.  The ovary was  then surrounded with moist lap sponges.  The antimesenteric portion of the  ovary was incised with a scalpel.  Hemostats were then used to dissect the  ovarian cortex from the underlying ovarian cyst.  This was performed bluntly  with hemostats along the entire length of the cyst.  Once the cyst was freed  and removed intact, the cautery was used to obtain hemostasis within the  ovarian defect.  A 2-0 Vicryl suture on an SH needle was used to close the  ovarian stroma.  A running locking stitch with the same 2-0 Vicryl was then  used to close the cortex.  The ovary was completely hemostatic.  The ovary  was returned to the abdominal cavity, visualized, and noted to be  hemostatic.  The decision was made not to perform washings during the case  secondary to the benign features of the cyst.  The fascia was then closed in  a running stitch of 0 Vicryl suture.  The subcutaneous tissues were  irrigated with copious  amounts of saline.  The skin edges were  reapproximated with staples.  Sponge, instrument, and needle counts were  correct at the end of the procedure.                                               Mary Sella. Orlene Erm, M.D.    EMH/MEDQ  D:  08/14/2002  T:  08/14/2002  Job:  [id number]

## 2010-11-05 NOTE — Group Therapy Note (Signed)
NAMEMarland Dunn  [name], [name] NO.:  [id number]   MEDICAL RECORD NO.:  [id number]          PATIENT TYPE:  WOC   LOCATION:  [facility]                   [facility]  [facility]   PHYSICIAN:  Elsie Lincoln, MD      DATE OF BIRTH:  March 15, 1981   DATE OF SERVICE:  01/25/2005                                    CLINIC NOTE   REASON FOR VISIT:  The patient is here for follow-up to pelvic pain and  dysmenorrhea. The patient underwent transvaginal ultrasound which showed a  2.2 x 2.6 x 2.2 cm right ovarian hemorrhagic cyst. I explained to the  patient that this was of nonconsequence and it can be a normal findings in  ovulating women. She does have dysmenorrhea with her periods and desires  something for that, as well as birth control. The patient has been Depo in  the past and would like to try that again. However, she refuses pills as she  has insomnia and sometimes forgets to take the pills. The patient was warned  about 10-pound weight gain and also to take calcium, given the  hypoestrogenemia that can occur with Depo-Provera. The patient is also  overdue for a Pap smear so we will proceed with one of those today.   PHYSICAL EXAMINATION:  GENERAL:  Well-nourished, well-developed, no apparent  distress.  ABDOMEN:  Soft, nontender, nondistended.  PELVIC:  Genitalia:  Tanner V. Vagina:  Pink, normal rugae. Cervix:  Clean,  nontender. Uterus anteverted, mobile, nontender. Adnexa:  No masses,  nontender.   ASSESSMENT AND PLAN:  A 30 year old well female with dysmenorrhea.   1.  Pap smear and cultures done.  2.  UCG today. Will do Depo in 2 weeks after negative UCG then, then q.12h.      for Depo.  3.  Return to clinic in 12 weeks as above.       KL/MEDQ  D:  01/25/2005  T:  01/25/2005  Job:  [id number]

## 2010-11-05 NOTE — Discharge Summary (Signed)
[facility]  Patient:    Kayla Dunn, Kayla Dunn Visit Number: [id number] MRN: [id number]          Service Type: OBS Location: 910D 9122 01 Attending Physician:  Amada Kingfisher. Dictated by:   Mont Dutton, M.D. Admit Date:  12/05/2001 Discharge Date: 12/13/2001                             Discharge Summary  DATE OF BIRTH:                    12/29/1980.  HISTORY AND HOSPITAL COURSE:      The patient is a 30 year old, G4, P1-0-2-1 presenting at 31 weeks 0 days with complaints of waking up at 5 oclock that morning with vaginal bleeding and bright red mucous followed by heavy cramping continuously for approximately 30 minutes.  The patient presented to [facility]U and at that time was noted to have a cervical exam of 2-3 cm, 40-50% effacement, and -3 station.  The patient was admitted for dynamic cervix.  She was also noted to be GBS positive.  She has obtained a complete OB ultrasound to rule out previa and ultrasound is noted to be anterior grade 1 placenta, single intrauterine pregnancy, AFI of 15.4, gestational age is by LMP of 34 weeks 3 days, gestational age by ultrasound average 31 weeks 0 days with an [facility] of February 06, 2002.  Estimated fetal weight 75th-90th percentile for 30 weeks. Fetal anatomy appeared normal.  Cervix was 1.3 cm which was noted to be dynamic from 3.6 cm prior.  DIC panel obtained within normal limits.  The patient was started on Unasyn for antibiotics, also given betamethasone 12.5 mg IM x2 each separated by 24 hours.  She was also started on magnesium sulfate for tocolysis.  During hospitalization contractions ranged anywhere from 0-3 contractions per hour to at times 8-9 contractions per hour. Magnesium sulfate was increased from 2 to 2.5 g/hr and then reduced back to 2 g/hr.  On December 11, 2001, magnesium was discontinued, contraction pattern was generally unchanged, had anywhere from 0-4 contractions per hour,  multiple episodes during hospitalization, the patients cervical exam rechecked.  After restarting magnesium, cervix decreased to 1 cm, thick, and high which remained stable throughout hospitalization.  Fetal fibronectin obtained 2 days prior to discharge which was negative.  As the patients cervical exam had improved and remained stable throughout hospitalization, in spite of an irregular contraction pattern, decision made for discharge to home with referral to high risk clinic for threatened preterm labor.  The patient also with headache multiple times during hospitalization however, headache not consistent with magnesium as the magnesium sulfate had been discontinued greater than 18 hours prior, also the patient with history of migraines was treated with Vicodin with minimal relief.  The patient of note did take acetaminophen/butalbital/ caffeine as an outpatient for her migraine headaches and had not been receiving that in the hospital.  This is thought is to be some rebound headache secondary to medication noncompliance inhospital.  CONDITION ON DISCHARGE:           Discharged in stable condition on December 13, 2001.  DISCHARGE MEDICATIONS:            The patient was to continue her outpatient medication regimen of Zyrtec 10 mg daily and acetaminophen/butalbital/caffeine as previously prescribed by her physician.  DISCHARGE INSTRUCTIONS:  She was given instructions on preterm labor and to call her doctor or return if contractions increase more than one to two per hour or change in nature compared to hospitalization.  WOUND CARE:                       Not applicable.  DIET AT HOME:                     No restrictions.  ACTIVITY:                         Bed rest.  Further instructions given to the patient on antenatal discharge instructions.  FOLLOWUP:                         Scheduled for high risk clinic on December 20, 2001 at 9 oclock in the morning or return to [facility]U sooner  if symptoms of contractions, vaginal bleeding, discharge, or possible ruptured bag of water. The patient in agreement with plan. Dictated by:   Mont Dutton, M.D. Attending Physician:  Amada Kingfisher. DD:  12/13/01 TD:  12/15/01 Job: [id number]

## 2010-11-05 NOTE — H&P (Signed)
NAME:  Kayla Dunn, Kayla Dunn                         ACCOUNT NO.:  [id number]   MEDICAL RECORD NO.:  [id number]                   PATIENT TYPE:   LOCATION:                                       [facility]   PHYSICIAN:  Mary Sella. Orlene Erm, M.D.                 DATE OF BIRTH:  09/03/1980   DATE OF ADMISSION:  DATE OF DISCHARGE:                                HISTORY & PHYSICAL   SUBJECTIVE:  The patient is a 30 year old black female gravida 4, para 4 who  is referred for history of abnormal Pap and ovarian cysts.  The patient  underwent Pap smear postpartum and had a Pap with low grade squamous  intraepithelial lesions.  In addition, she was having some abdominal pain  and she underwent pelvic ultrasound which revealed a right ovarian cyst that  was 7.3 x 4.1 x 3.7 and had a consistency and calcifications consistent with  a right ovarian dermoid.  The patient denies any severe abdominal pain,  nausea, and vomiting.  She has had some intermittent right lower quadrant  pain.  The patient was seen in the clinic and underwent colposcopy and  cervical biopsies which revealed benign squamous mucosa.  The patient  returns to clinic for evaluation and treatment of her probable dermoid cyst.   PAST MEDICAL HISTORY:  None.   PAST SURGICAL HISTORY:  She has undergone cesarean section x1.   PAST OBSTETRICAL HISTORY:  She has had a spontaneous vaginal delivery and  one cesarean section.   PAST GYNECOLOGIC HISTORY:  Significant for the low grade SIL Pap smear with  a normal biopsy.   MEDICATIONS:  Ibuprofen.   ALLERGIES:  ASPIRIN.   SOCIAL HISTORY:  Significant for one-half pack per day tobacco use.  Negative for alcohol or drug abuse.   FAMILY HISTORY:  Significant for hypertension, diabetes mellitus, and  cancers.   PHYSICAL EXAMINATION:  VITAL SIGNS:  Blood pressure 136/74, weight 184.4,  pulse 94.  HEENT:  Normocephalic, atraumatic.  HEART:  Regular rate and rhythm without murmur, rub,  or gallop.  LUNGS:  Clear to auscultation bilaterally.  ABDOMEN:  Soft, nondistended.  No palpable masses are noted.  EXTREMITIES:  Without clubbing, cyanosis, edema.  PELVIC:  EGBUS otherwise normal.  Vagina is pink and rugated without  lesions.  Cervix is parous.  Bimanual examination reveals an 8 cm inverted  uterus.  There is right adnexal fullness and tenderness, although her  examination is limited secondary to her habitus.   ASSESSMENT/PLAN:  A 30 year old gravida __________ with 7 cm right adnexal  mass that is consistent with a dermoid cyst.  I discussed with the patient  the options for following the surgery including laparoscopic evaluation and  cystectomy versus open cystectomy or oophorectomy.  The patient desires to  proceed with surgical intervention.  I have discussed with patient the risks  of bleeding, infection, injury to internal  organs, risks of transfusion.  Additionally, I discussed with the patient possibility that she may end up  needing her entire ovary removed.  We will attempt to maintain her right  ovary if at all possible.  The patient understands these risks and wishes to  proceed.   Will schedule this surgery at the patient's earliest convenience.                                               Mary Sella. Orlene Erm, M.D.    EMH/MEDQ  D:  08/07/2002  T:  08/07/2002  Job:  [id number]

## 2010-11-17 ENCOUNTER — Ambulatory Visit: Payer: Medicaid Other | Admitting: Obstetrics & Gynecology

## 2010-11-19 ENCOUNTER — Ambulatory Visit: Payer: Medicaid Other | Admitting: Obstetrics and Gynecology

## 2010-11-29 NOTE — Discharge Summary (Signed)
Kayla Dunn, Kayla Dunn               ACCOUNT NO.:  [id number]  MEDICAL RECORD NO.:  [id number]           PATIENT TYPE:  LOCATION:                                 FACILITY:  PHYSICIAN:  Allie Bossier, MD        DATE OF BIRTH:  [birth year]  DATE OF ADMISSION:  10/06/2010 DATE OF DISCHARGE:  10/12/2010                              DISCHARGE SUMMARY   PRIMARY CARE PROVIDER:  The patient received her prenatal care [facility].  REASON FOR HOSPITALIZATION:  Severe gestational hypertension with concern for preeclampsia.  DISCHARGE DIAGNOSES: 1. Elective repeat lower transverse C-section and bilateral tubal     ligation. 2. Gestational hypertension.  DISCHARGE MEDICATIONS:  New medications: 1. Norvasc 10 mg p.o. daily 2. Labetalol 200 mg p.o. b.i.d. 3. HCTZ 25 mg p.o. daily. 4. Colace 100 mg b.i.d. p.r.n. postpartum constipation. 5. Iron sulfate 325 mg p.o. b.i.d. for postpartum anemia x6 weeks. 6. Ibuprofen 600 mg p.o. q.6 h. p.r.n. postpartum pain.  CONSULTS:  None.  PROCEDURES:  Elective repeat lower transverse C-section and bilateral tubal ligation.  LABORATORY DATA:  Antepartum CBC:  Hemoglobin 8.6, hematocrit 27.2. Postpartum CBC on April 22, hemoglobin 8.2, hematocrit 26.1.  Urine drug screen on April 12 was negative.  Baby's meconium screen was still pending at the time of discharge, but baby's urine drug screen was negative.  BRIEF HOSPITAL COURSE:  This is a 30 year old G9, P1-2-5-3 who was admitted for severe gestational hypertension and with concern for preeclampsia secondary to worsening headaches on admission.  The patient has a history of 2 preterm deliveries at 36 weeks and was receiving her prenatal care at the [facility].  The patient also has a history of preeclampsia during 2 pregnancies.  The patient is status post C- section but had 2 successful vaginal birth after C-section.  The patient was worked up for possible preeclampsia, however,  this was ruled out when her 24-hour protein was 216 on April 19.  However, due to concern for preeclampsia, the patient was started on magnesium after her admission.  The patient's blood pressure was also controlled with labetalol 200 mg b.i.d.  In the hospital, the patient's blood pressure remained mostly in the 140s to 150s/80s on this regimen with occasional systolics in the 160s.  Her fetal heart tracing was consistently reactive without decelerations.  When the patient was at 37.0 weeks, the patient was induced for labor secondary to her wishes to have another vaginal birth after C-section.  The risk of VBAC were discussed with the patient but the patient wanted to go ahead with this plan.  The patient was begun induction of labor on October 08, 2010 with Pitocin.  The patient was also continued on magnesium and experienced with no adverse reactions.  However, later in the evening on April 20, the patient was complaining of severe abdominal pressure and decided that she wanted repeat elective lower transverse C- section instead.  The risks and benefits were discussed with the patient who elected to go through with it.  A C-section and bilateral tubal ligation were performed on  October 09, 2010.  The infant was found to be in the vertex position.  Apgar's were 7 and 9 and the infant weighed 5 pounds 6 ounces.  Estimated blood loss was 800 mL.  A bilateral tubal ligation was also performed.  A complete summary of the operative report has been transcribed.   Postoperatively, the patient did well but did continue to have elevated blood pressures. The patient was started on HCTZ 25 mg daily, Norvasc 10 mg daily, and labetalol 200 mg b.i.d.  Her blood pressures at the time of discharge were within 140s-150s and the diastolic blood pressures were not greater than 100 on this regimen of medications.  The patient was asked to follow up in 1-2 weeks at the [facility] to have her  blood pressure reevaluated.  The patient received staples after her surgical procedures and the staples were removed before discharge.  Social work was also consulted during this admission due to the patient's history of depression and previous history of marijuana use.  The patient denied any recent symptoms of depression and denied using marijuana after finding out that she was pregnant.  The infant's UDS was negative but the meconium screen was still pending at the time of this dictation.  DISCHARGE INSTRUCTIONS: 1. Follow up at the [facility] Clinic at [facility] in 1-2 weeks for     reevaluation of blood pressure. 2. Pelvic rest x6 weeks.  DISCHARGE CONDITION:  The patient was discharged home in stable medical condition.    ______________________________ Priscella Mann, MD   ______________________________ Allie Bossier, MD    AO/MEDQ  D:  10/12/2010  T:  10/12/2010  Job:  [id number]  Electronically Signed by Priscella Mann MD on 10/23/2010 10:51:50 AM Electronically Signed by Nicholaus Bloom MD on 11/29/2010 09:44:27 AM

## 2010-12-12 ENCOUNTER — Inpatient Hospital Stay (HOSPITAL_COMMUNITY)
Admission: AD | Admit: 2010-12-12 | Discharge: 2010-12-12 | Disposition: A | Discharge status: Home / Self Care | Payer: Medicaid Other | Source: Ambulatory Visit | Attending: Obstetrics & Gynecology | Admitting: Obstetrics & Gynecology

## 2010-12-12 ENCOUNTER — Inpatient Hospital Stay (HOSPITAL_COMMUNITY): Payer: Medicaid Other

## 2010-12-12 DIAGNOSIS — G43909 Migraine, unspecified, not intractable, without status migrainosus: Secondary | ICD-10-CM

## 2010-12-12 DIAGNOSIS — K219 Gastro-esophageal reflux disease without esophagitis: Secondary | ICD-10-CM | POA: Insufficient documentation

## 2010-12-12 DIAGNOSIS — N898 Other specified noninflammatory disorders of vagina: Secondary | ICD-10-CM

## 2010-12-12 DIAGNOSIS — G4700 Insomnia, unspecified: Secondary | ICD-10-CM

## 2010-12-12 LAB — URINALYSIS, ROUTINE W REFLEX MICROSCOPIC
Bilirubin Urine: NEGATIVE
Glucose, UA: NEGATIVE mg/dL
Specific Gravity, Urine: 1.025 (ref 1.005–1.030)
Urobilinogen, UA: 0.2 mg/dL (ref 0.0–1.0)
pH: 6 (ref 5.0–8.0)

## 2010-12-12 LAB — CBC
HCT: 30.9 % — ABNORMAL LOW (ref 36.0–46.0)
Hemoglobin: 10.1 g/dL — ABNORMAL LOW (ref 12.0–15.0)
MCV: 86.6 fL (ref 78.0–100.0)
RBC: 3.57 MIL/uL — ABNORMAL LOW (ref 3.87–5.11)
WBC: 7 10*3/uL (ref 4.0–10.5)

## 2010-12-12 LAB — POCT PREGNANCY, URINE: Preg Test, Ur: NEGATIVE

## 2010-12-12 LAB — URINE MICROSCOPIC-ADD ON

## 2010-12-12 IMAGING — US US PELVIS COMPLETE
1 series · 13 of 25 positions shown · non-contrast
Comparison: None

CLINICAL DATA: Heavy vaginal bleeding; cesarean section and
bilateral tubal ligation [DATE]

TRANSABDOMINAL AND TRANSVAGINAL ULTRASOUND OF PELVIS
TECHNIQUE: Both transabdominal and transvaginal ultrasound
examinations of the pelvis were performed. Transabdominal technique
was performed for global imaging of the pelvis including uterus,
ovaries, adnexal regions, and pelvic cul-de-sac.

[Series 1: us transvaginal non-ob · 43 acquisitions, 13 frames shown]
[im 1/43]
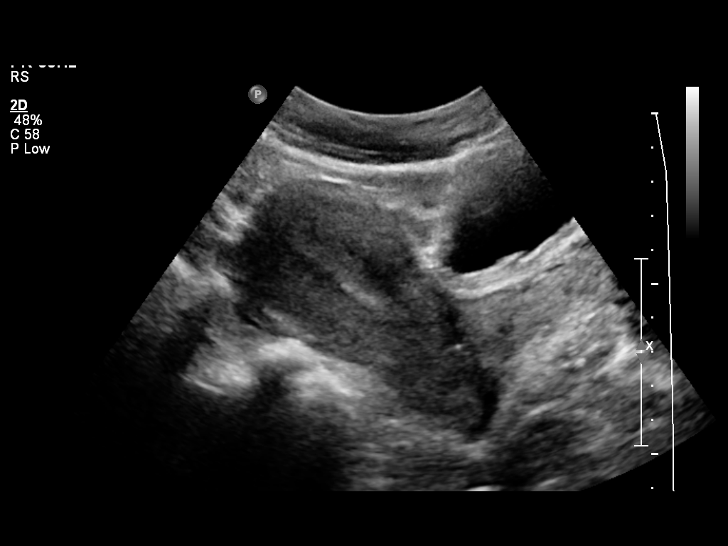
[im 4/43]
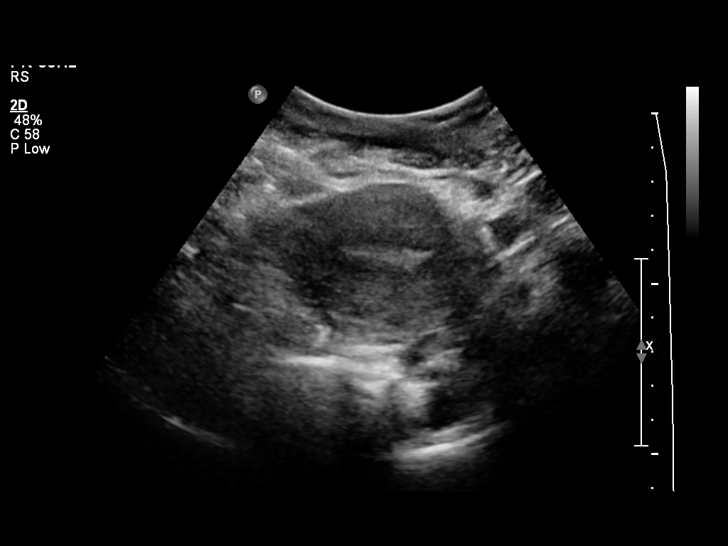
[im 8/43]
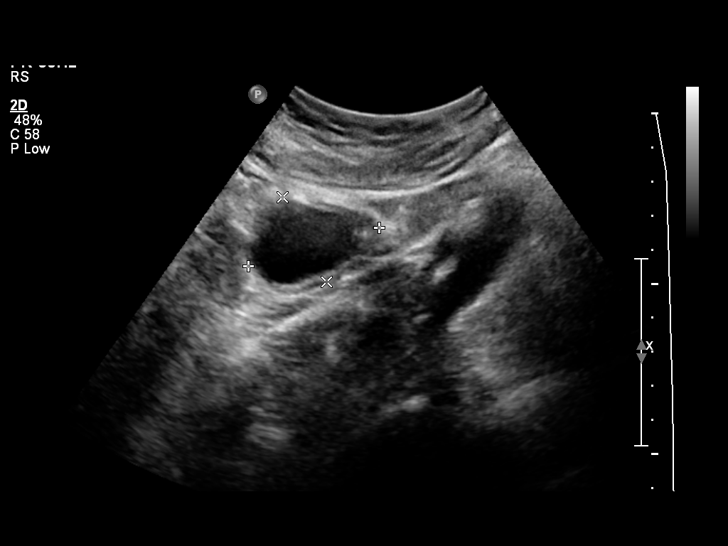
[im 11/43]
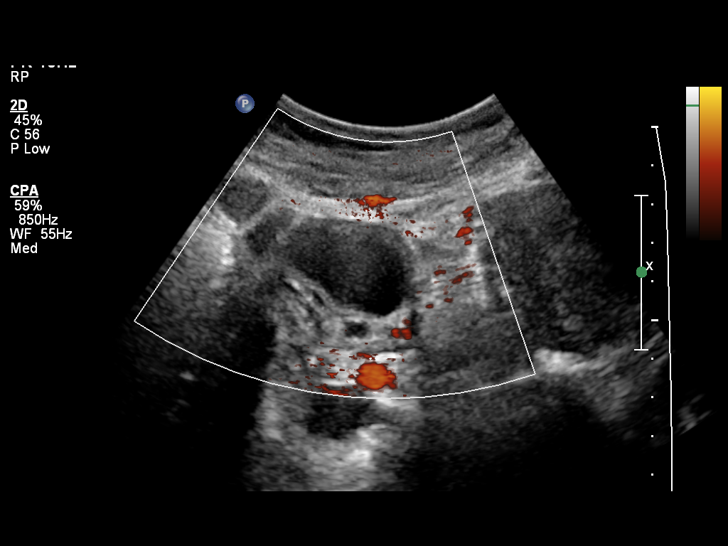
[im 15/43]
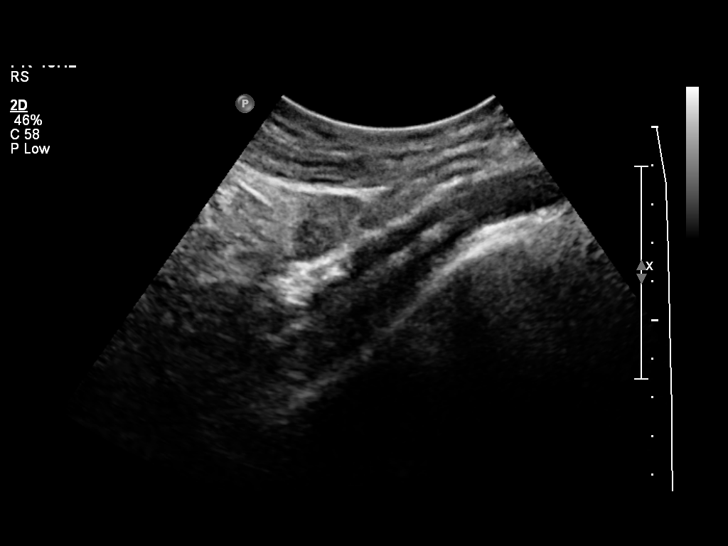
[im 18/43]
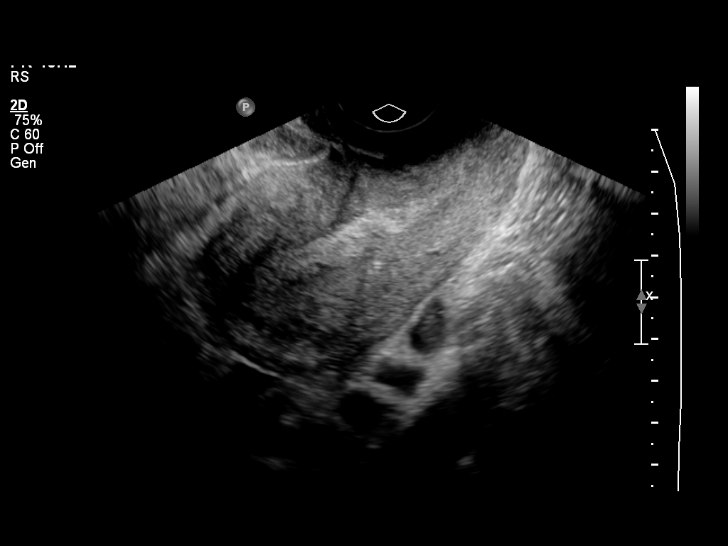
[im 22/43]
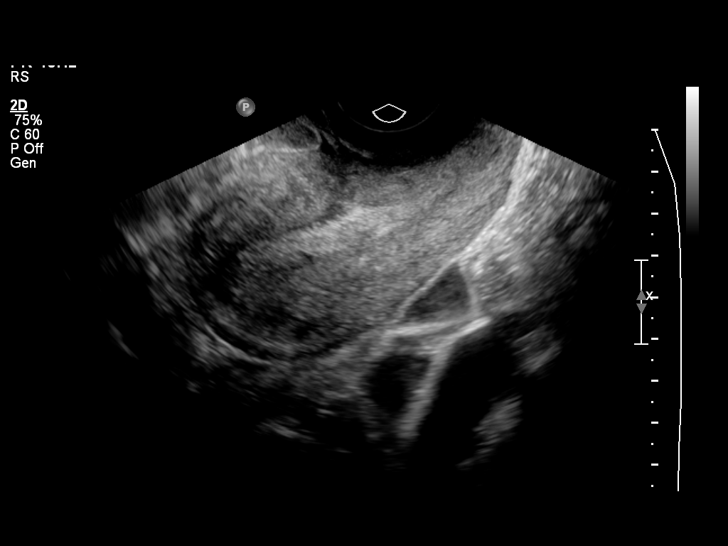
[im 25/43]
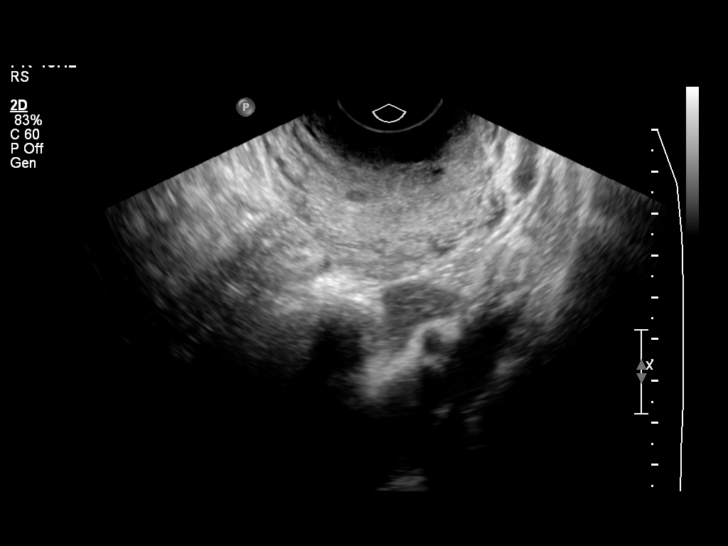
[im 29/43]
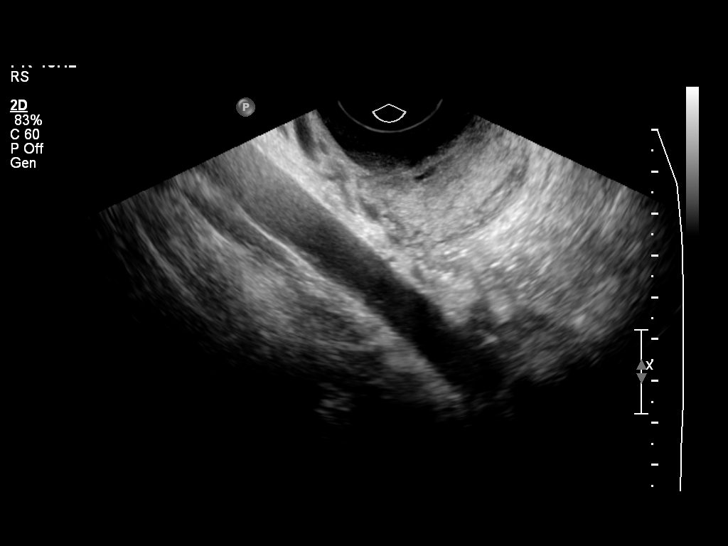
[im 32/43]
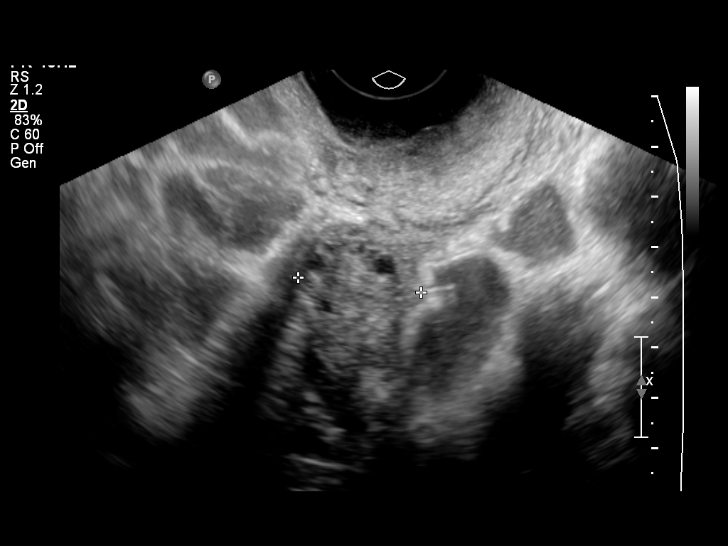
[im 36/43]
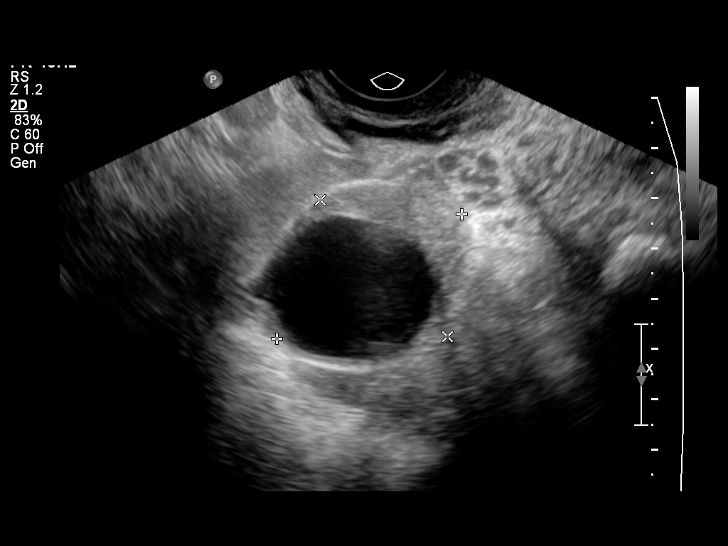
[im 39/43]
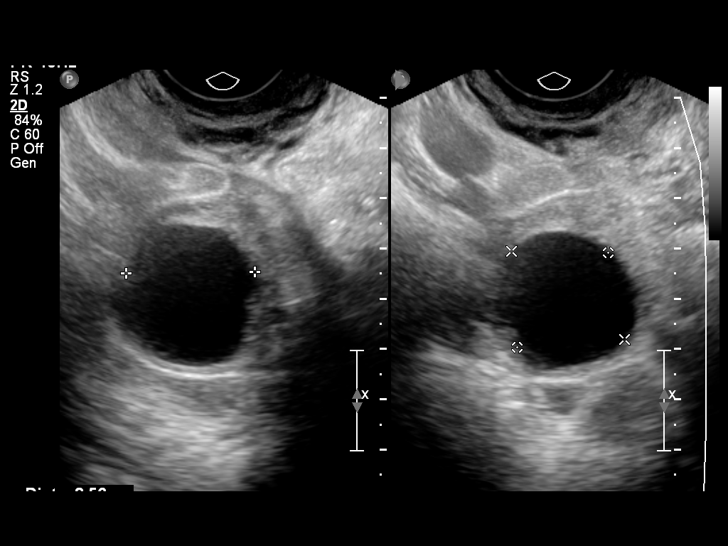
[im 43/43]
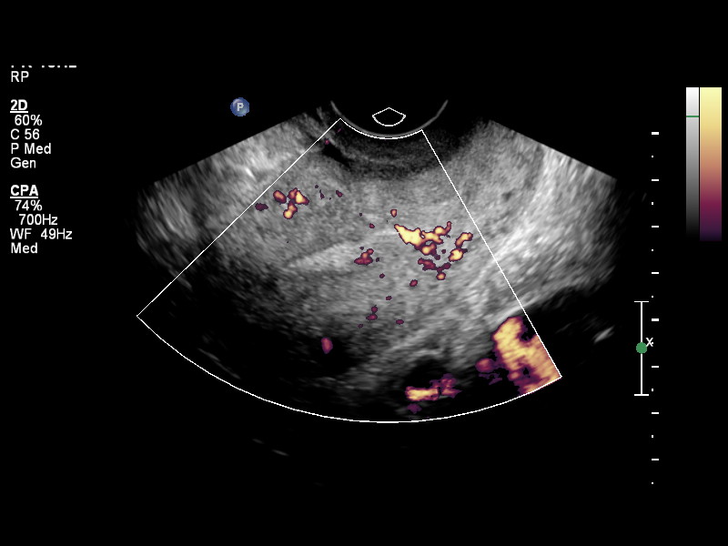

[13 of 25 positions shown; findings below may reference images not displayed]

It was necessary to proceed with endovaginal exam following the
transabdomnial exam to better visualize the right ovarian lesion
FINDINGS: Uterus: 8.8 cm length by 5.0 cm AP by 5.2 cm transverse.  Normal
morphology without mass.

Endometrium: 8 mm thick, normal.  No endometrial fluid.  On color
Doppler imaging blood flow is seen along the posterior margin of
the endometrial complex at the mid uterine segment.

Right ovary:  4.4 x 3.7 x 3.1 cm.  Simple cyst right ovary 2.6 x
2.9 x 2.6 cm.  No additional right ovarian mass.  Blood flow
present within right ovary on color Doppler imaging.

Left ovary: 3.3 x 1.8 x 2.5 cm.  Normal morphology without mass.
Blood flow present within left ovary on color Doppler imaging.

Other findings: No free pelvic fluid at additional adnexal masses.
IMPRESSION: 2.9 cm diameter right ovarian cyst.
Otherwise negative exam.

## 2011-01-13 ENCOUNTER — Ambulatory Visit: Payer: Medicaid Other | Admitting: Family Medicine

## 2011-03-02 ENCOUNTER — Encounter: Payer: Self-pay | Admitting: Obstetrics & Gynecology

## 2011-03-02 ENCOUNTER — Ambulatory Visit (INDEPENDENT_AMBULATORY_CARE_PROVIDER_SITE_OTHER): Payer: Medicaid Other | Admitting: Obstetrics & Gynecology

## 2011-03-02 VITALS — BP 137/88 | HR 79 | Temp 98.5°F | Ht 61.0 in | Wt 166.2 lb

## 2011-03-02 DIAGNOSIS — R102 Pelvic and perineal pain: Secondary | ICD-10-CM

## 2011-03-02 DIAGNOSIS — N949 Unspecified condition associated with female genital organs and menstrual cycle: Secondary | ICD-10-CM

## 2011-03-02 MED ORDER — HYDROCHLOROTHIAZIDE 25 MG PO TABS: 25.0000 mg | ORAL_TABLET | Freq: Every day | ORAL | Status: DC

## 2011-03-02 MED ORDER — CYCLOBENZAPRINE HCL 10 MG PO TABS: 10.0000 mg | ORAL_TABLET | Freq: Three times a day (TID) | ORAL | Status: DC | PRN

## 2011-03-02 NOTE — Progress Notes (Signed)
  Subjective:     Kayla Dunn is a 30 y.o. female who presents for evaluation of abdominal pain and follow up of blood pressure.  She underwent a cesarean section/BTL on 10/09/10 at 37 weeks for severe range chronic hypertension.  She has been on Labetalol and Amlodipine; but discontinued Labetalol due to feeling dizzy, lightheaded, and having heart palpitations.  She wants to try another antihypertensive.  Her pelvic pain is associated with menses; mild-moderate.  No other symptoms.  Starting her period now. She reports that it is "not that serious" and takes Ibuprofen as needed.  Patient is also requesting Flexeril for occasional back spasms. No other systemic symptoms.    Menstrual History: OB History    Grav Para Term Preterm Abortions TAB SAB Ect Mult Living   9 4 1 3 5  5   4      Patient's last menstrual period was 03/02/2011.    The following portions of the patient's history were reviewed and updated as appropriate: allergies, current medications, past family history, past medical history, past social history, past surgical history and problem list.   Review of Systems A comprehensive review of systems was negative.    Objective:    BP 137/88  Pulse 79  Temp(Src) 98.5 F (36.9 C) (Oral)  Ht 5\' 1"  (1.549 m)  Wt 166 lb 3.2 oz (75.388 kg)  BMI 31.40 kg/m2  LMP 03/02/2011 General:   alert and no distress  Lungs:   clear to auscultation bilaterally  Heart:   regular rate and rhythm  Abdomen:  soft, non-tender; bowel sounds normal; no masses,  no organomegaly  CVA:   absent  Pelvis:  Vulva and vagina appear normal. Bimanual exam reveals normal uterus and adnexa. Mild tenderness on uterine exam, no CMT, no adnexal tenderness  Extremities:   extremities normal, atraumatic, no cyanosis or edema  Neurologic:   negative  Psychiatric:   normal mood, behavior, speech, dress, and thought processes   Lab Review Labs: GC/Chlamydia DNA probe of cervico/vaginal secretions and Wet  mount of vaginal secretions   Imaging Ultrasound - Pelvic Vaginal  in 11/2010 showed normal small uterus, normal adnexa (3 cm right ovarian cyst)   Assessment and Plan:  Mild dysmenorrhea - Continue NSAIDs are needed Chronic HTN  - Ordered HCTZ, will use in conjunction with Amlodipine.  If not controlled, will need to see PCP. Muscle spasms - Ordered Flexeril RTC in 04/2011 for annual exam or earlier if needed.

## 2011-03-03 LAB — WET PREP, GENITAL
Clue Cells Wet Prep HPF POC: NONE SEEN
Trich, Wet Prep: NONE SEEN

## 2011-03-24 LAB — POCT URINALYSIS DIP (DEVICE)
Glucose, UA: NEGATIVE mg/dL
Nitrite: NEGATIVE
Urobilinogen, UA: 1 mg/dL (ref 0.0–1.0)

## 2011-03-24 LAB — POCT PREGNANCY, URINE: Preg Test, Ur: NEGATIVE

## 2011-03-24 LAB — COMPREHENSIVE METABOLIC PANEL
Alkaline Phosphatase: 53 U/L (ref 39–117)
BUN: 13 mg/dL (ref 6–23)
Creatinine, Ser: 0.72 mg/dL (ref 0.4–1.2)
GFR calc Af Amer: >60 mL/min (ref >60)
Glucose, Bld: 85 mg/dL (ref 70–99)
Sodium: 138 mEq/L (ref 135–145)
Total Bilirubin: 0.5 mg/dL (ref 0.3–1.2)
Total Protein: 6.9 g/dL (ref 6.0–8.3)

## 2011-03-24 LAB — DIFFERENTIAL
Lymphocytes Relative: 39 % (ref 12–46)
Monocytes Absolute: 0.5 10*3/uL (ref 0.1–1.0)
Monocytes Relative: 7 % (ref 3–12)
Neutro Abs: 3.3 10*3/uL (ref 1.7–7.7)

## 2011-03-24 LAB — POCT I-STAT, CHEM 8
Chloride: 105 mEq/L (ref 96–112)
Creatinine, Ser: 0.9 mg/dL (ref 0.4–1.2)
Glucose, Bld: 82 mg/dL (ref 70–99)
Potassium: 3.7 mEq/L (ref 3.5–5.1)

## 2011-03-24 LAB — CBC
Platelets: 299 10*3/uL (ref 150–400)
RBC: 4.11 MIL/uL (ref 3.87–5.11)
WBC: 6.9 10*3/uL (ref 4.0–10.5)

## 2011-03-24 LAB — WET PREP, GENITAL
Trich, Wet Prep: NONE SEEN
WBC, Wet Prep HPF POC: NONE SEEN
Yeast Wet Prep HPF POC: NONE SEEN

## 2011-03-29 LAB — POCT PREGNANCY, URINE: Preg Test, Ur: NEGATIVE

## 2011-03-30 LAB — DIFFERENTIAL
Eosinophils Relative: 7 — ABNORMAL HIGH
Lymphocytes Relative: 40
Lymphs Abs: 3
Monocytes Absolute: 0.7
Monocytes Relative: 9
Neutro Abs: 3.3

## 2011-03-30 LAB — COMPREHENSIVE METABOLIC PANEL
AST: 22
Albumin: 3.6
Calcium: 9.2
Chloride: 104
Creatinine, Ser: 0.72
GFR calc Af Amer: >60
Total Protein: 6.4

## 2011-03-30 LAB — URINALYSIS, ROUTINE W REFLEX MICROSCOPIC
Bilirubin Urine: NEGATIVE
Glucose, UA: NEGATIVE
Ketones, ur: NEGATIVE
pH: 7

## 2011-03-30 LAB — CBC
MCHC: 34.4
MCV: 89.4
Platelets: 343
RDW: 13.6
WBC: 7.5

## 2011-03-30 LAB — WET PREP, GENITAL: Trich, Wet Prep: NONE SEEN

## 2011-04-01 LAB — CBC
HCT: 33.4 — ABNORMAL LOW
HCT: 35.4 — ABNORMAL LOW
HCT: 29.3 — ABNORMAL LOW
Hemoglobin: 11.4 — ABNORMAL LOW
Hemoglobin: 12.4
Hemoglobin: 10.1 — ABNORMAL LOW
Hemoglobin: 10.8 — ABNORMAL LOW
MCHC: 34
MCHC: 34.6
MCHC: 34.7
MCHC: 34.7
MCV: 89.4
MCV: 90.1
MCV: 90.4
Platelets: 263
RBC: 3.72 — ABNORMAL LOW
RBC: 3.24 — ABNORMAL LOW
RBC: 3.5 — ABNORMAL LOW
RDW: 13.3
RDW: 13.4
RDW: 13.5
RDW: 12.8
WBC: 7.4

## 2011-04-01 LAB — RAPID URINE DRUG SCREEN, HOSP PERFORMED
Amphetamines: NOT DETECTED
Barbiturates: NOT DETECTED
Opiates: NOT DETECTED

## 2011-04-01 LAB — RH IMMUNE GLOB WKUP(>/=20WKS)(NOT WOMEN'S HOSP): Fetal Screen: NEGATIVE

## 2011-04-01 LAB — STREP B DNA PROBE: Strep Group B Ag: POSITIVE

## 2011-04-01 LAB — TYPE AND SCREEN
ABO/RH(D): O NEG
Antibody Screen: POSITIVE
DAT, IgG: NEGATIVE

## 2011-04-01 LAB — POCT URINALYSIS DIP (DEVICE)
Glucose, UA: NEGATIVE
Nitrite: NEGATIVE
Specific Gravity, Urine: 1.02
Urobilinogen, UA: 1
pH: 7

## 2011-04-01 LAB — FETAL FIBRONECTIN: Fetal Fibronectin: POSITIVE

## 2011-04-01 LAB — GC/CHLAMYDIA PROBE AMP, GENITAL
Chlamydia, DNA Probe: NEGATIVE
GC Probe Amp, Genital: NEGATIVE

## 2011-04-01 LAB — WET PREP, GENITAL: Trich, Wet Prep: NONE SEEN

## 2011-04-06 LAB — URINALYSIS, ROUTINE W REFLEX MICROSCOPIC
Bilirubin Urine: NEGATIVE
Ketones, ur: NEGATIVE
Nitrite: NEGATIVE
Protein, ur: NEGATIVE
Specific Gravity, Urine: 1.025
Urobilinogen, UA: 0.2

## 2011-04-06 LAB — WET PREP, GENITAL: Trich, Wet Prep: NONE SEEN

## 2011-07-07 ENCOUNTER — Ambulatory Visit (INDEPENDENT_AMBULATORY_CARE_PROVIDER_SITE_OTHER): Payer: Medicaid Other | Admitting: Ophthalmology

## 2011-08-01 ENCOUNTER — Ambulatory Visit (INDEPENDENT_AMBULATORY_CARE_PROVIDER_SITE_OTHER): Payer: Medicaid Other | Admitting: Ophthalmology

## 2011-08-07 ENCOUNTER — Emergency Department (HOSPITAL_COMMUNITY)
Admission: EM | Admit: 2011-08-07 | Discharge: 2011-08-07 | Disposition: A | Discharge status: Home / Self Care | Payer: Medicaid Other | Attending: Emergency Medicine | Admitting: Emergency Medicine

## 2011-08-07 ENCOUNTER — Emergency Department (HOSPITAL_COMMUNITY): Payer: Medicaid Other

## 2011-08-07 ENCOUNTER — Encounter (HOSPITAL_COMMUNITY): Payer: Self-pay

## 2011-08-07 DIAGNOSIS — Z79899 Other long term (current) drug therapy: Secondary | ICD-10-CM | POA: Insufficient documentation

## 2011-08-07 DIAGNOSIS — R509 Fever, unspecified: Secondary | ICD-10-CM | POA: Insufficient documentation

## 2011-08-07 DIAGNOSIS — J3489 Other specified disorders of nose and nasal sinuses: Secondary | ICD-10-CM | POA: Insufficient documentation

## 2011-08-07 DIAGNOSIS — R05 Cough: Secondary | ICD-10-CM | POA: Insufficient documentation

## 2011-08-07 DIAGNOSIS — I10 Essential (primary) hypertension: Secondary | ICD-10-CM | POA: Insufficient documentation

## 2011-08-07 DIAGNOSIS — R059 Cough, unspecified: Secondary | ICD-10-CM | POA: Insufficient documentation

## 2011-08-07 DIAGNOSIS — J329 Chronic sinusitis, unspecified: Secondary | ICD-10-CM

## 2011-08-07 IMAGING — CR DG CHEST 2V
2 series · 2 of 2 positions shown · non-contrast
Comparison: [DATE]

CLINICAL DATA: Cough and fever for 3 weeks.

CHEST - 2 VIEW

[w chest pa]
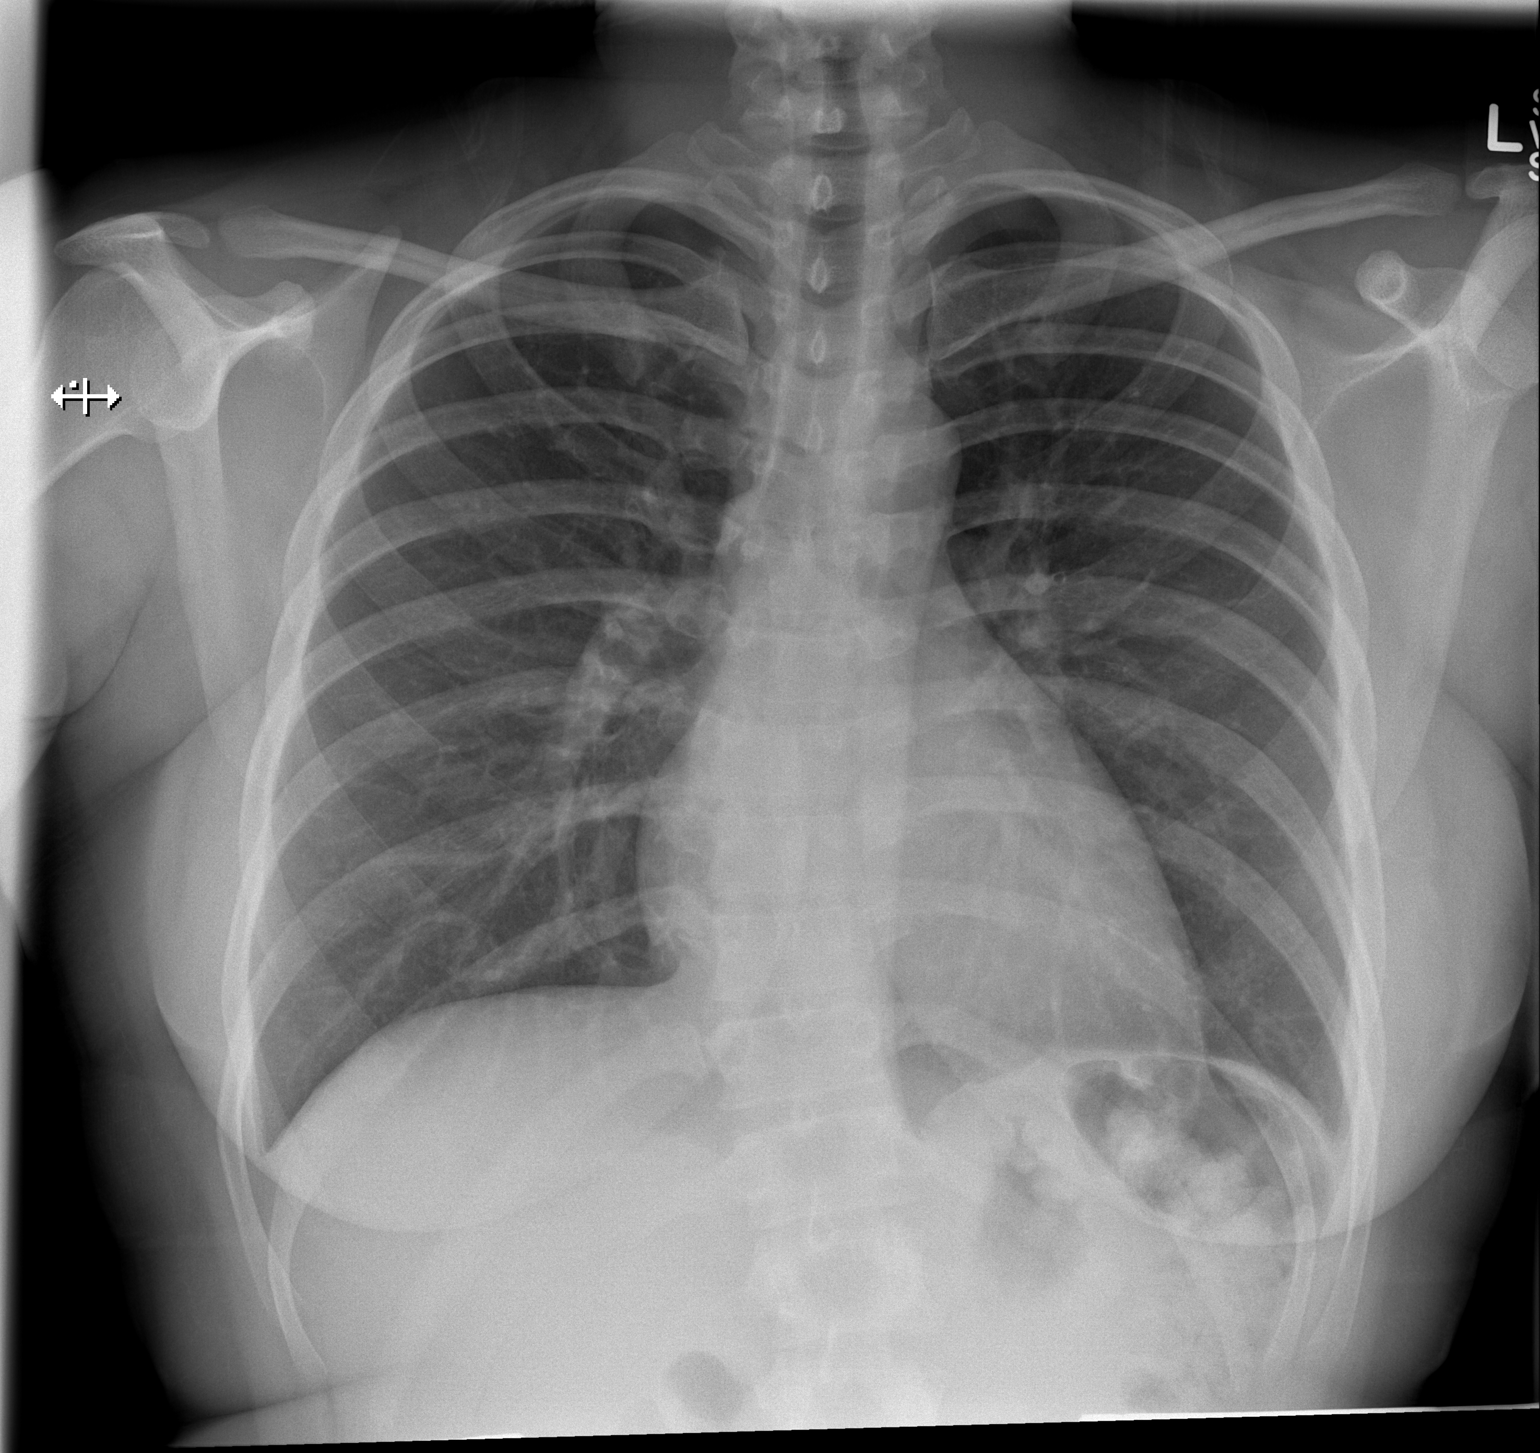

[w chest lat]
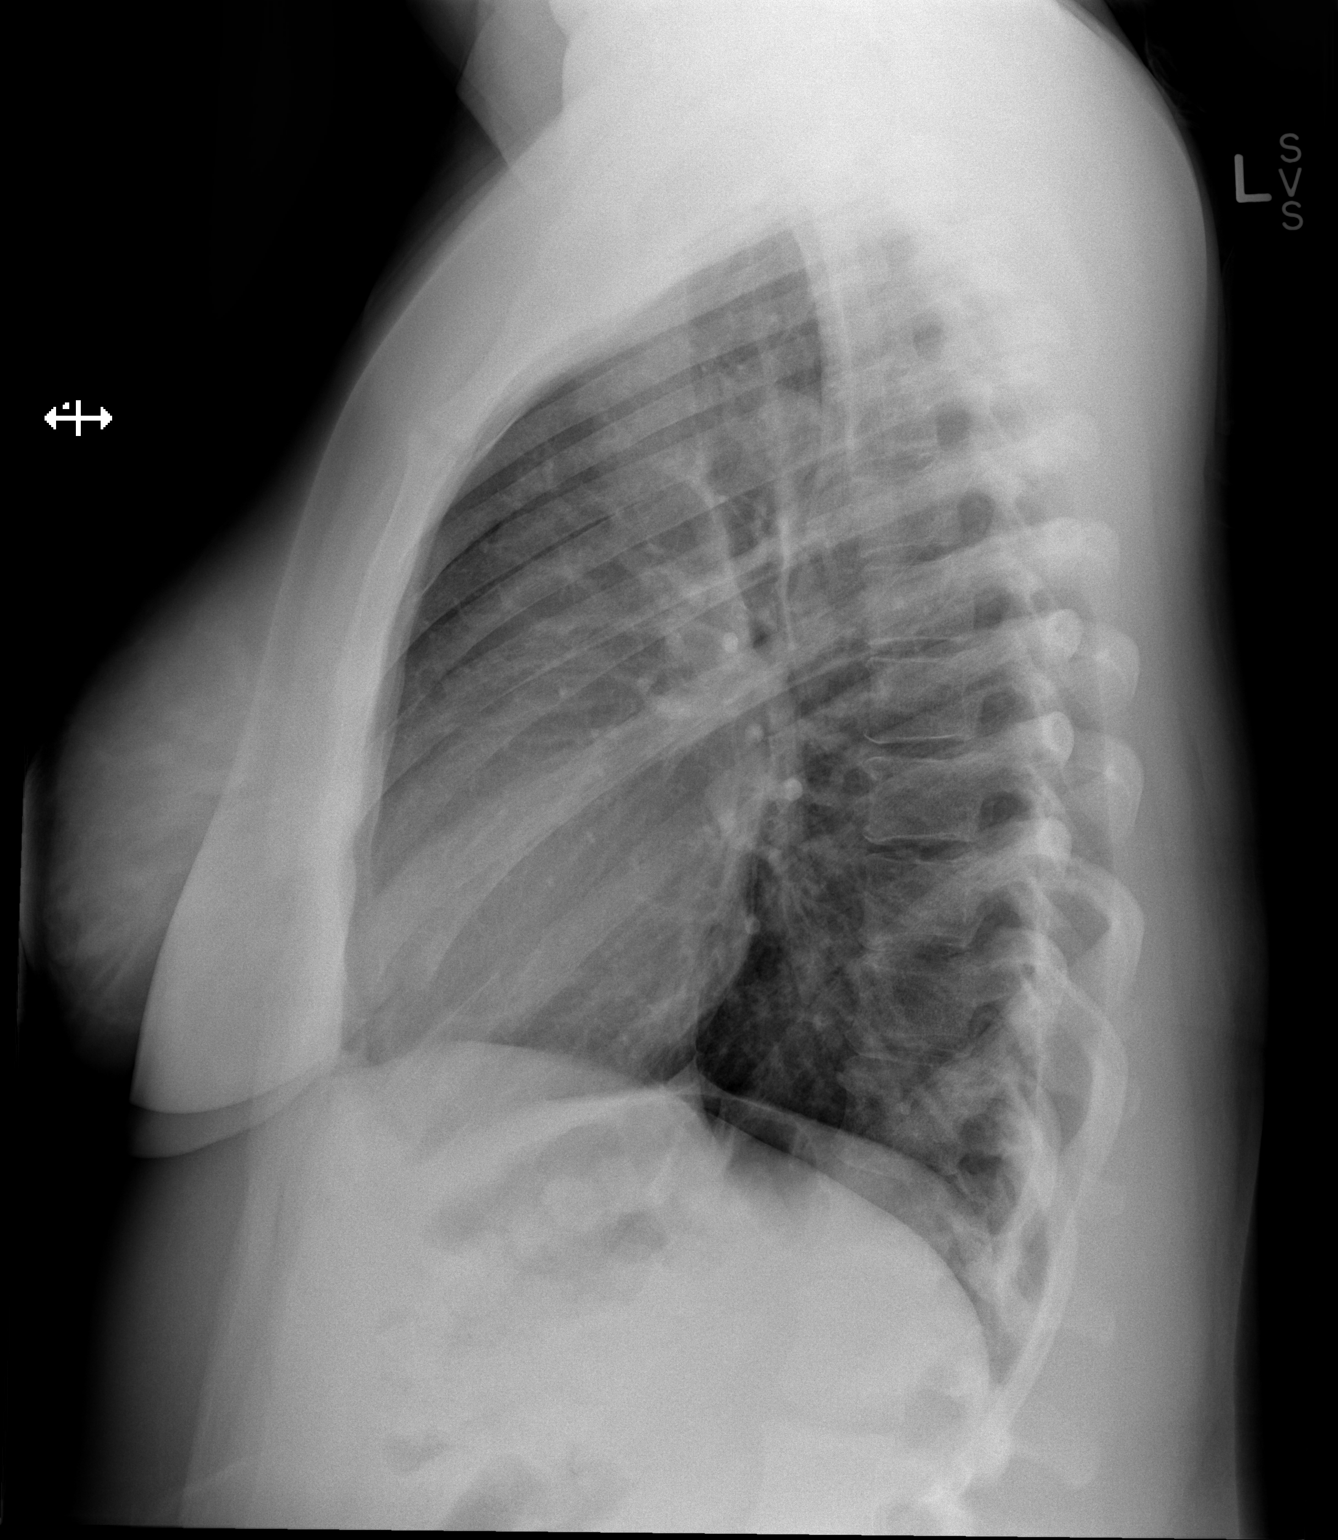

[2 of 2 positions shown; findings below may reference images not displayed]

FINDINGS: Midline trachea.  Convex right thoracic spine curvature.
Normal heart size and mediastinal contours. No pleural effusion or
pneumothorax.  Clear lungs.
IMPRESSION: No acute cardiopulmonary disease.

## 2011-08-07 MED ORDER — GUAIFENESIN 100 MG/5ML PO LIQD: 100.0000 mg | ORAL | Status: AC | PRN

## 2011-08-07 MED ORDER — AZITHROMYCIN 250 MG PO TABS: 250.0000 mg | ORAL_TABLET | Freq: Every day | ORAL | Status: AC

## 2011-08-07 NOTE — Discharge Instructions (Signed)
Your chest x-ray was clear and did not show signs of a pneumonia or bronchitis. Because you are having sinus tenderness, we will treat you with antibiotics for a sinus infection. Take Tylenol or Motrin as needed for fever or body aches. If you are not improving after taking the antibiotics, please make a followup with your primary care doctor for recheck. If you have shortness of breath or chest pain or any other worrisome symptoms, please return to the ER.  Sinusitis Sinuses are air pockets within the bones of your face. The growth of bacteria within a sinus leads to infection. The infection prevents the sinuses from draining. This infection is called sinusitis. SYMPTOMS  There will be different areas of pain depending on which sinuses have become infected.  The maxillary sinuses often produce pain beneath the eyes.   Frontal sinusitis may cause pain in the middle of the forehead and above the eyes.  Other problems (symptoms) include:  Toothaches.   Colored, pus-like (purulent) drainage from the nose.   Swelling, warmth, and tenderness over the sinus areas may be signs of infection.  TREATMENT  Sinusitis is most often determined by an exam.X-rays may be taken. If x-rays have been taken, make sure you obtain your results or find out how you are to obtain them. Your caregiver may give you medications (antibiotics). These are medications that will help kill the bacteria causing the infection. You may also be given a medication (decongestant) that helps to reduce sinus swelling.  HOME CARE INSTRUCTIONS   Only take over-the-counter or prescription medicines for pain, discomfort, or fever as directed by your caregiver.   Drink extra fluids. Fluids help thin the mucus so your sinuses can drain more easily.   Applying either moist heat or ice packs to the sinus areas may help relieve discomfort.   Use saline nasal sprays to help moisten your sinuses. The sprays can be found at your local  drugstore.  SEEK IMMEDIATE MEDICAL CARE IF:  You have a fever.   You have increasing pain, severe headaches, or toothache.   You have nausea, vomiting, or drowsiness.   You develop unusual swelling around the face or trouble seeing.  MAKE SURE YOU:   Understand these instructions.   Will watch your condition.   Will get help right away if you are not doing well or get worse.  Document Released: 06/06/2005 Document Revised: 02/16/2011 Document Reviewed: 01/03/2007 [facility] Patient Information 2012 [place], [place].

## 2011-08-07 NOTE — ED Notes (Signed)
Patient reports that she has had [ersistant dry cough x 3 weeks that is worse at night and early am. In no distress

## 2011-08-07 NOTE — ED Provider Notes (Signed)
History     CSN: [id number]  Arrival date & time 08/07/11  1715   First MD Initiated Contact with Patient 08/07/11 1830      No chief complaint on file.   (Consider location/radiation/quality/duration/timing/severity/associated sxs/prior treatment) The history is provided by the patient.   Patient presents with persistent dry cough which she states present for approximately the last 3 weeks. It has been nonproductive in nature. Seems to be worse when she is lying down at night. She has tried over-the-counter cough syrup with minimal relief. She's had some associated sinus pressure with nasal congestion. States she's had some subjective fevers at home. Denies chest pain or shortness of breath. She has a pertinent past medical history of asthma.  Past Medical History  Diagnosis Date  . Hypertension   . Insomnia   . Anemia   . Bronchitis     Past Surgical History  Procedure Date  . Cesarean section   . Tubal ligation     Family History  Problem Relation Age of Onset  . Cancer Mother     colon  . Arthritis Mother   . Hypertension Father   . Liver disease Father   . Stroke Father   . Diabetes Paternal Grandmother   . Hypertension Paternal Grandmother   . Heart disease Paternal Grandmother     History  Substance Use Topics  . Smoking status: Former Smoker -- 0.2 packs/day for 12 years  . Smokeless tobacco: Never Used  . Alcohol Use: Yes    OB History    Grav Para Term Preterm Abortions TAB SAB Ect Mult Living   9 4 1 3 5  5   4       Review of Systems  Constitutional: Positive for fever and chills. Negative for activity change and appetite change.  HENT: Positive for congestion and sinus pressure. Negative for ear pain, sore throat, trouble swallowing, neck pain and neck stiffness.   Eyes: Negative for discharge and redness.  Respiratory: Negative for chest tightness and shortness of breath.   Cardiovascular: Negative for chest pain, palpitations and leg  swelling.  Gastrointestinal: Negative for nausea, vomiting and abdominal pain.  Genitourinary: Negative.   Musculoskeletal: Negative for myalgias.  Skin: Negative for color change and rash.  Neurological: Negative for dizziness, weakness and headaches.    Allergies  Aspirin; Hydromorphone hcl; and Morphine  Home Medications   Current Outpatient Rx  Name Route Sig Dispense Refill  . DEXTROMETHORPHAN-GUAIFENESIN 10-100 MG/5ML PO LIQD Oral Take 5 mLs by mouth every 4 (four) hours as needed. For cold symptom relief    . GUAIFENESIN ER 600 MG PO TB12 Oral Take 1,200 mg by mouth 2 (two) times daily.    . IBUPROFEN 200 MG PO TABS Oral Take 200 mg by mouth every 6 (six) hours as needed. For pain rlief    . PRENAVITE MULTIPLE VITAMIN 28-0.8 MG PO TABS Oral Take 1 tablet by mouth daily.      Marland Kitchen PSEUDOEPH-DOXYLAMINE-DM-APAP 60-7.11-16-998 MG/30ML PO LIQD Oral Take 30 mLs by mouth every 6 (six) hours as needed. For cold and flu symptom relief      BP 172/93  Pulse 79  Temp(Src) 99.1 F (37.3 C) (Oral)  Resp 16  Ht 5' 1.5" (1.562 m)  Wt 175 lb (79.379 kg)  BMI 32.53 kg/m2  SpO2 97%  LMP 08/04/2011  Physical Exam  Nursing note and vitals reviewed. Constitutional: She is oriented to person, place, and time. She appears well-developed and well-nourished. No distress.  HENT:  Head: Normocephalic and atraumatic.  Right Ear: External ear normal.  Left Ear: External ear normal.  Mouth/Throat: Oropharynx is clear and moist. No oropharyngeal exudate.       Tympanic membranes normal bilaterally. Oropharynx clear. Tender to palpation and percussion over left frontal and maxillary sinuses.  Eyes: Conjunctivae and EOM are normal. Pupils are equal, round, and reactive to light. Right eye exhibits no discharge. Left eye exhibits no discharge.  Neck: Normal range of motion.  Cardiovascular: Normal rate, regular rhythm and normal heart sounds.  Exam reveals no gallop and no friction rub.   No murmur  heard. Pulmonary/Chest: Effort normal and breath sounds normal. No respiratory distress. She has no wheezes. She has no rales. She exhibits no tenderness.  Abdominal: Soft. There is no tenderness.  Lymphadenopathy:    She has no cervical adenopathy.  Neurological: She is alert and oriented to person, place, and time.  Skin: Skin is warm and dry. She is not diaphoretic.  Psychiatric: She has a normal mood and affect.    ED Course  Procedures (including critical care time)  Labs Reviewed - No data to display Dg Chest 2 View  08/07/2011  *RADIOLOGY REPORT*  Clinical Data: Cough and fever for 3 weeks.  CHEST - 2 VIEW  Comparison: 02/17/2008  Findings: Midline trachea.  Convex right thoracic spine curvature. Normal heart size and mediastinal contours. No pleural effusion or pneumothorax.  Clear lungs.  IMPRESSION: No acute cardiopulmonary disease.  Original Report Authenticated By: Consuello Bossier, M.D.     1. Sinus infection       MDM  Patient with 3 weeks of cough, sinus pressure, and subjective fevers. Chest x-ray is unremarkable. We'll treat with azithromycin. Return precautions discussed.       Grant Fontana, [place] 08/08/11 [phone]

## 2011-08-08 NOTE — ED Provider Notes (Signed)
Medical screening examination/treatment/procedure(s) were performed by non-physician practitioner and as supervising physician I was immediately available for consultation/collaboration.   Nekhi Liwanag, MD 08/08/11 2255 

## 2013-03-10 ENCOUNTER — Emergency Department (HOSPITAL_COMMUNITY): Payer: Medicaid Other

## 2013-03-10 ENCOUNTER — Encounter (HOSPITAL_COMMUNITY): Payer: Self-pay | Admitting: Emergency Medicine

## 2013-03-10 ENCOUNTER — Emergency Department (HOSPITAL_COMMUNITY)
Admission: EM | Admit: 2013-03-10 | Discharge: 2013-03-10 | Disposition: A | Discharge status: Home / Self Care | Payer: Medicaid Other | Attending: Emergency Medicine | Admitting: Emergency Medicine

## 2013-03-10 DIAGNOSIS — K2970 Gastritis, unspecified, without bleeding: Secondary | ICD-10-CM | POA: Insufficient documentation

## 2013-03-10 DIAGNOSIS — R5381 Other malaise: Secondary | ICD-10-CM | POA: Insufficient documentation

## 2013-03-10 DIAGNOSIS — R509 Fever, unspecified: Secondary | ICD-10-CM | POA: Insufficient documentation

## 2013-03-10 DIAGNOSIS — Z79899 Other long term (current) drug therapy: Secondary | ICD-10-CM | POA: Insufficient documentation

## 2013-03-10 DIAGNOSIS — Z87891 Personal history of nicotine dependence: Secondary | ICD-10-CM | POA: Insufficient documentation

## 2013-03-10 DIAGNOSIS — Z8709 Personal history of other diseases of the respiratory system: Secondary | ICD-10-CM | POA: Insufficient documentation

## 2013-03-10 DIAGNOSIS — Z3202 Encounter for pregnancy test, result negative: Secondary | ICD-10-CM | POA: Insufficient documentation

## 2013-03-10 DIAGNOSIS — Z862 Personal history of diseases of the blood and blood-forming organs and certain disorders involving the immune mechanism: Secondary | ICD-10-CM | POA: Insufficient documentation

## 2013-03-10 DIAGNOSIS — R109 Unspecified abdominal pain: Secondary | ICD-10-CM

## 2013-03-10 DIAGNOSIS — I10 Essential (primary) hypertension: Secondary | ICD-10-CM | POA: Insufficient documentation

## 2013-03-10 LAB — CBC WITH DIFFERENTIAL/PLATELET
Basophils Absolute: 0 10*3/uL (ref 0.0–0.1)
Lymphocytes Relative: 31 % (ref 12–46)
Lymphs Abs: 1.9 10*3/uL (ref 0.7–4.0)
Neutrophils Relative %: 57 % (ref 43–77)
Platelets: 383 10*3/uL (ref 150–400)
RBC: 4.05 MIL/uL (ref 3.87–5.11)
WBC: 6.2 10*3/uL (ref 4.0–10.5)

## 2013-03-10 LAB — HEPATIC FUNCTION PANEL
Albumin: 4 g/dL (ref 3.5–5.2)
Alkaline Phosphatase: 71 U/L (ref 39–117)
Total Protein: 7.6 g/dL (ref 6.0–8.3)

## 2013-03-10 LAB — BASIC METABOLIC PANEL
CO2: 25 mEq/L (ref 19–32)
GFR calc non Af Amer: >90 mL/min (ref >90)
Glucose, Bld: 96 mg/dL (ref 70–99)
Potassium: 3.7 mEq/L (ref 3.5–5.1)
Sodium: 137 mEq/L (ref 135–145)

## 2013-03-10 LAB — LIPASE, BLOOD: Lipase: 23 U/L (ref 11–59)

## 2013-03-10 LAB — URINALYSIS, ROUTINE W REFLEX MICROSCOPIC
Glucose, UA: NEGATIVE mg/dL
Hgb urine dipstick: NEGATIVE
Leukocytes, UA: NEGATIVE
Specific Gravity, Urine: 1.026 (ref 1.005–1.030)
Urobilinogen, UA: 1 mg/dL (ref 0.0–1.0)

## 2013-03-10 IMAGING — CR DG ABDOMEN ACUTE W/ 1V CHEST
3 series · 3 of 3 positions shown · non-contrast
Comparison: [DATE].

CLINICAL DATA: Mid and left abdominal pain. Nausea. Chest pain

EXAM:
ACUTE ABDOMEN SERIES (ABDOMEN 2 VIEW & CHEST 1 VIEW)

[w chest pa]
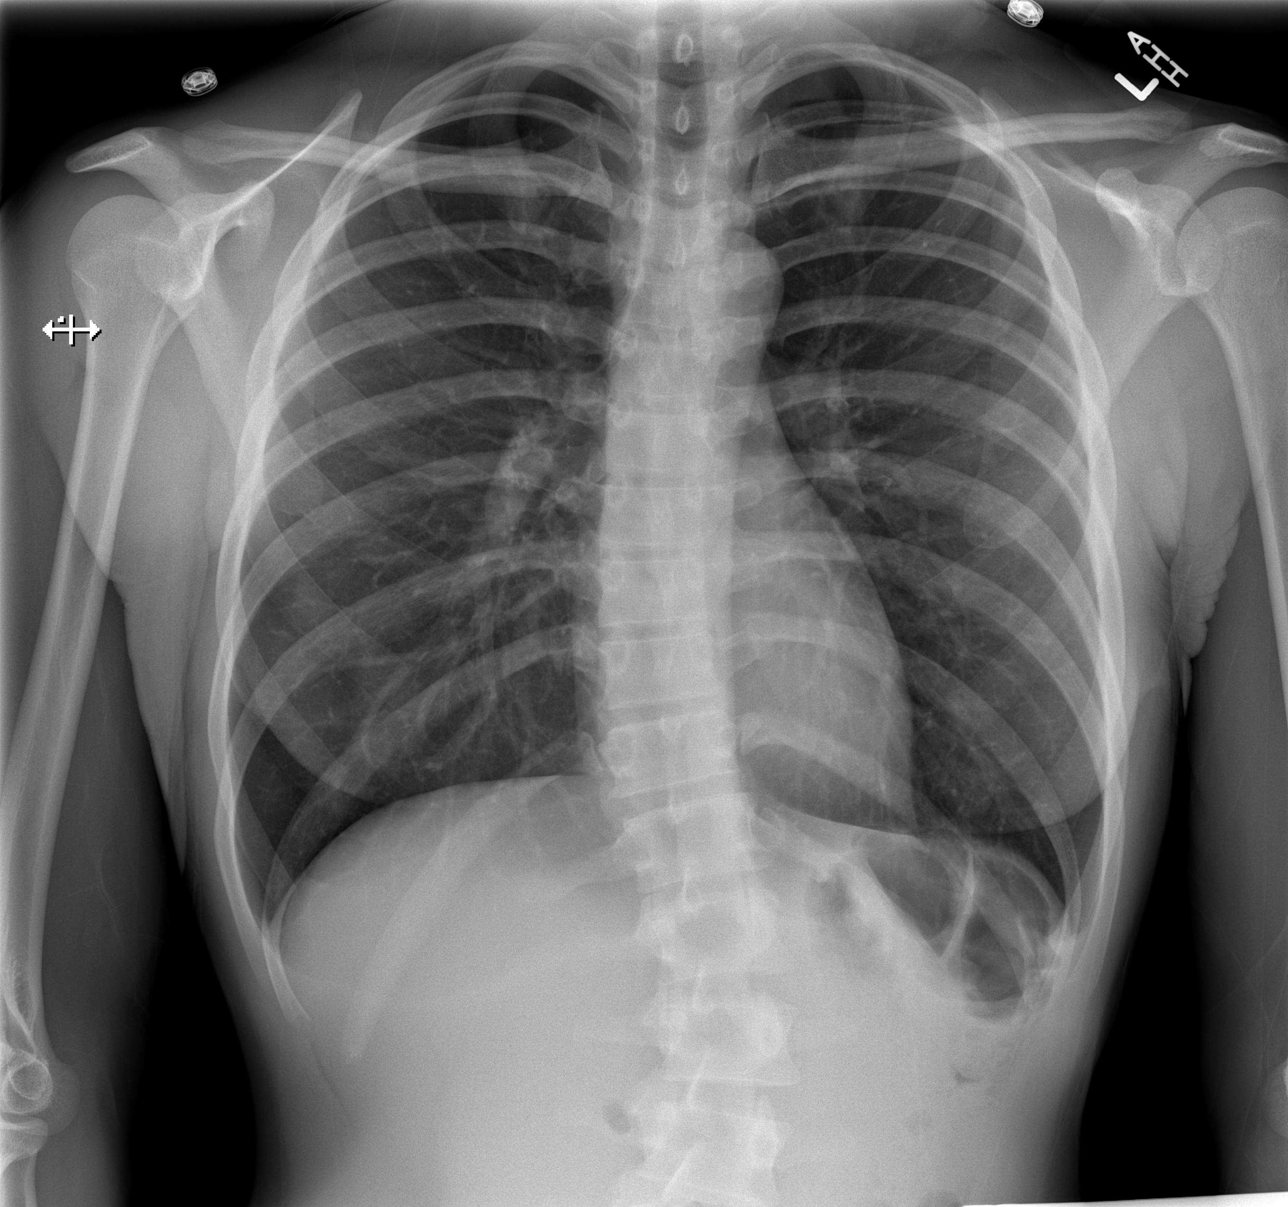

[w abdomen upright]
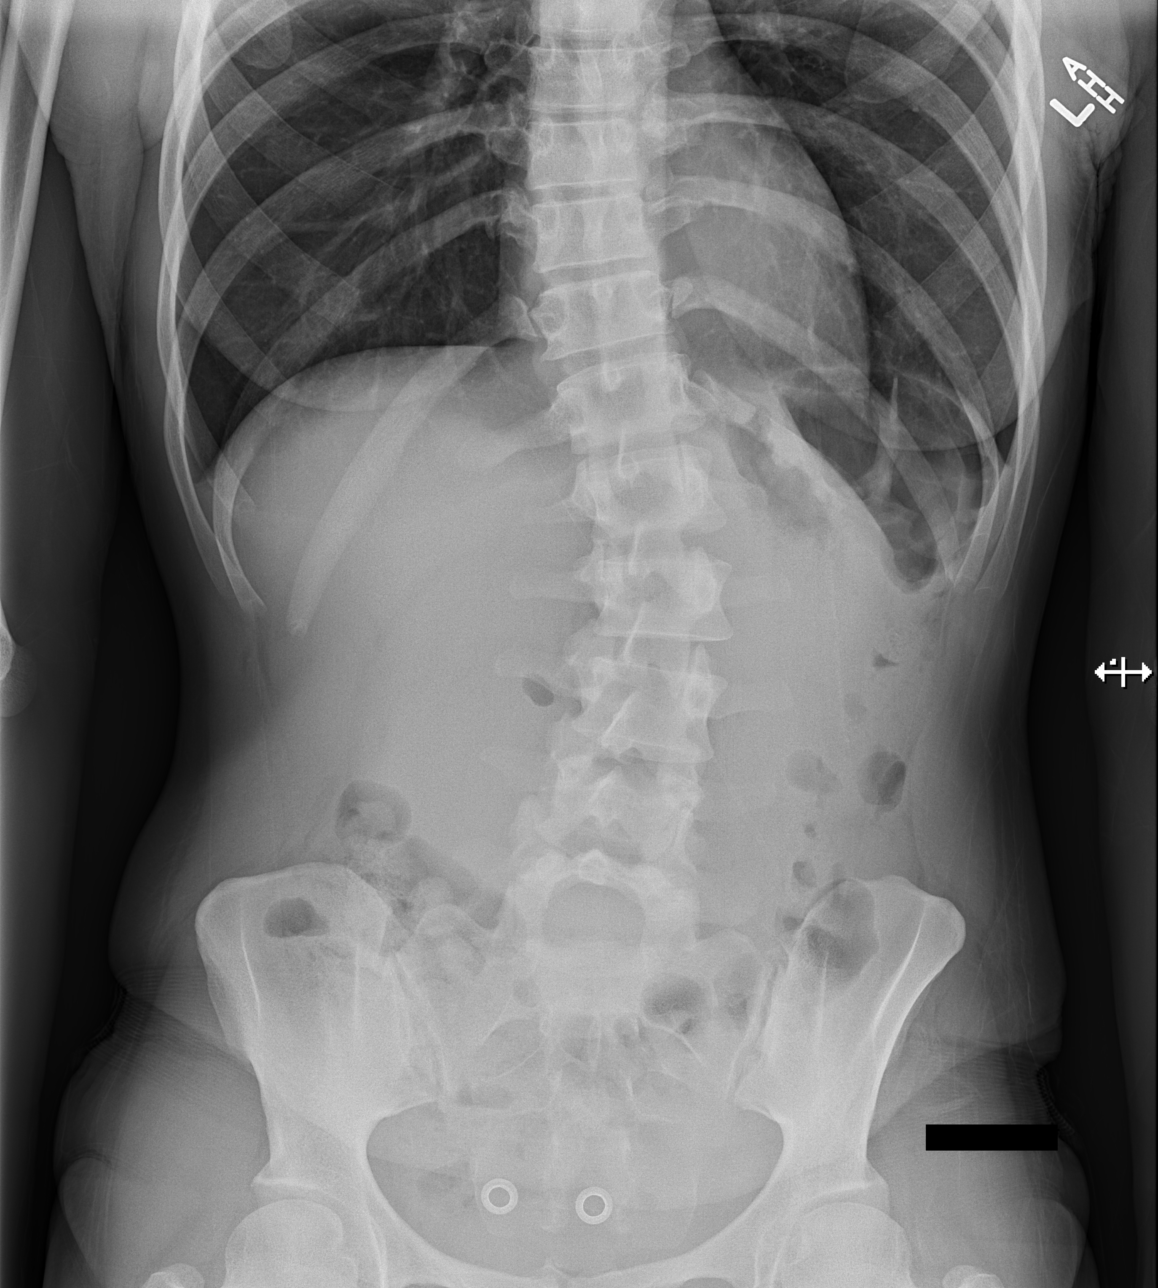

[t abdomen supine]
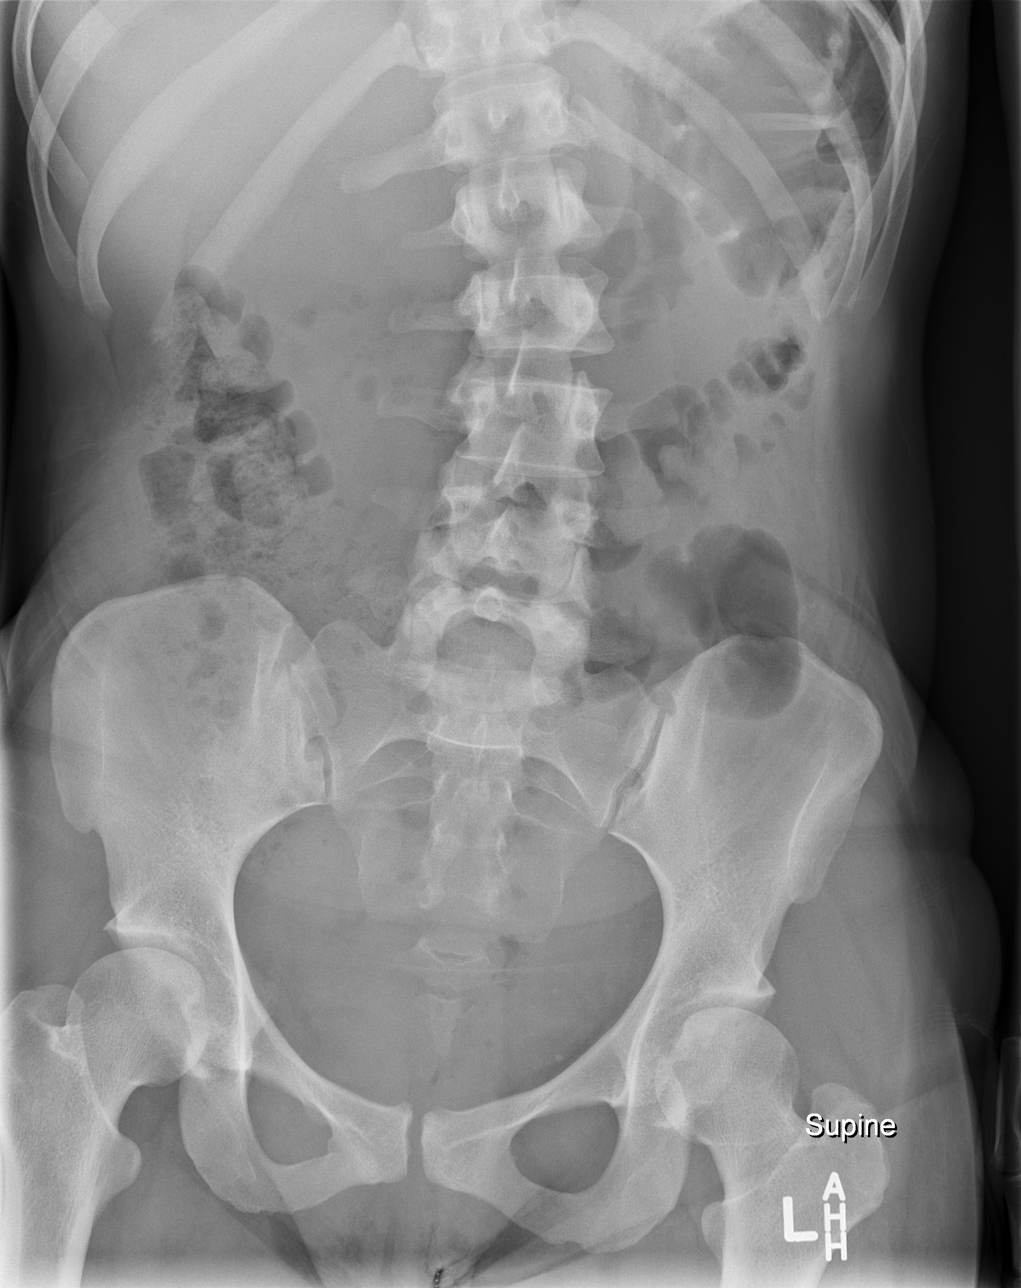

[3 of 3 positions shown; findings below may reference images not displayed]

FINDINGS: There is no evidence of dilated bowel loops or free intraperitoneal
air. No radiopaque calculi or other significant radiographic
abnormality is seen. Heart size and mediastinal contours are within
normal limits. Both lungs are clear. Scoliosis deformity involves
the thoracic and lumbar spine.
IMPRESSION: Negative abdominal radiographs.  No acute cardiopulmonary disease.

## 2013-03-10 IMAGING — US US ABDOMEN COMPLETE
1 series · 14 of 25 positions shown · non-contrast
Comparison: None.

CLINICAL DATA: Right upper quadrant pain

EXAM:
ABDOMEN ULTRASOUND

[Series 1: us abdomen complete · 0.22mm/px · 14 of 58 slices shown]
[im 1/58]
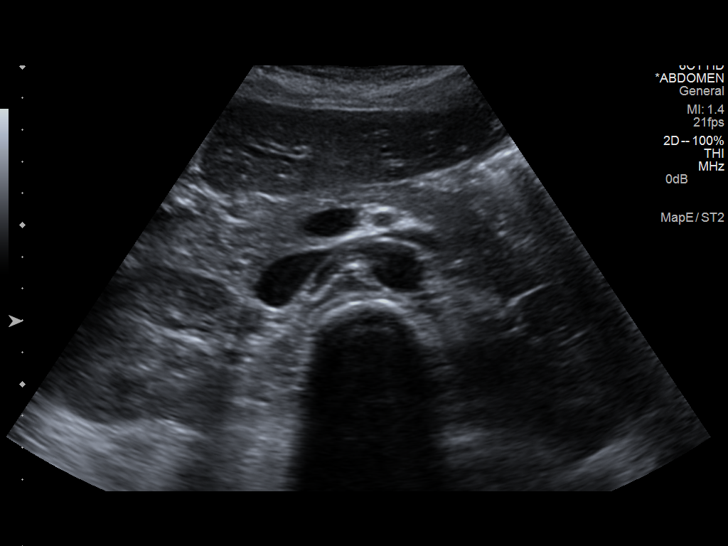
[im 5/58]
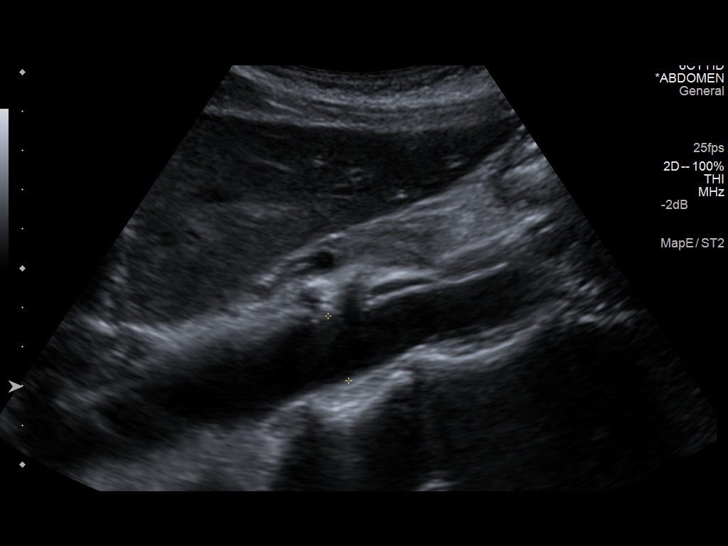
[im 10/58]
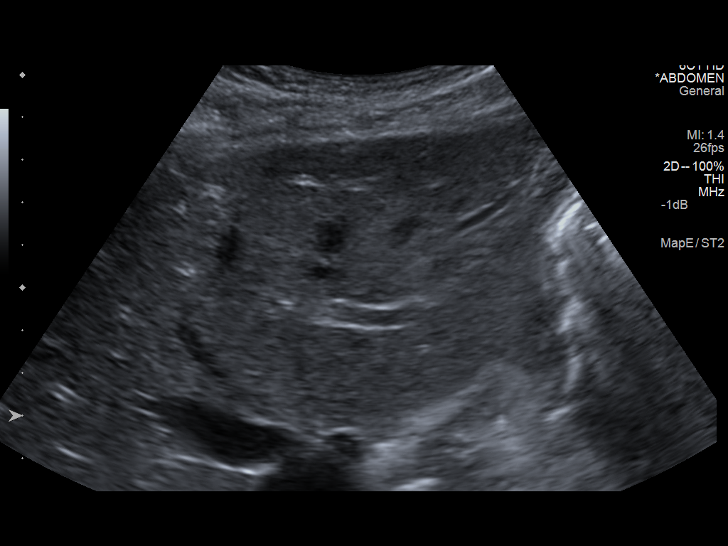
[im 15/58]
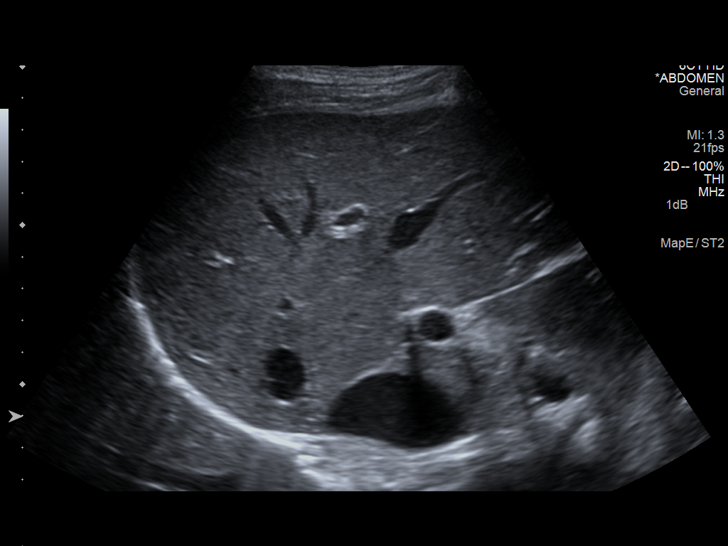
[im 20/58]
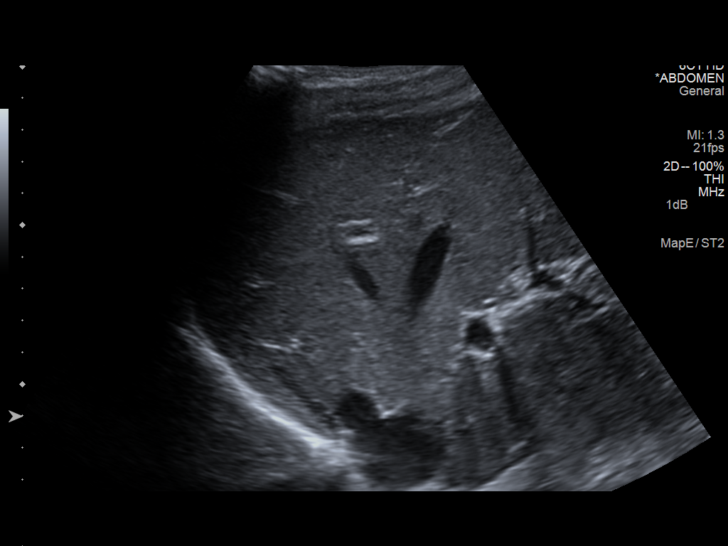
[im 22/58]
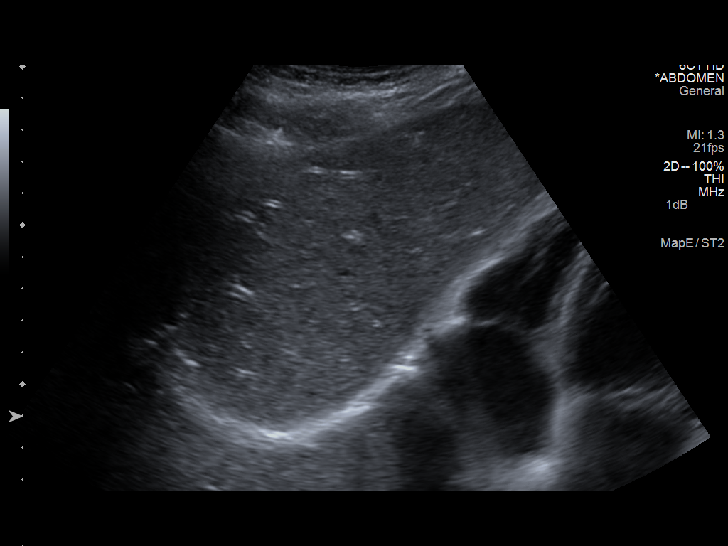
[im 27/58]
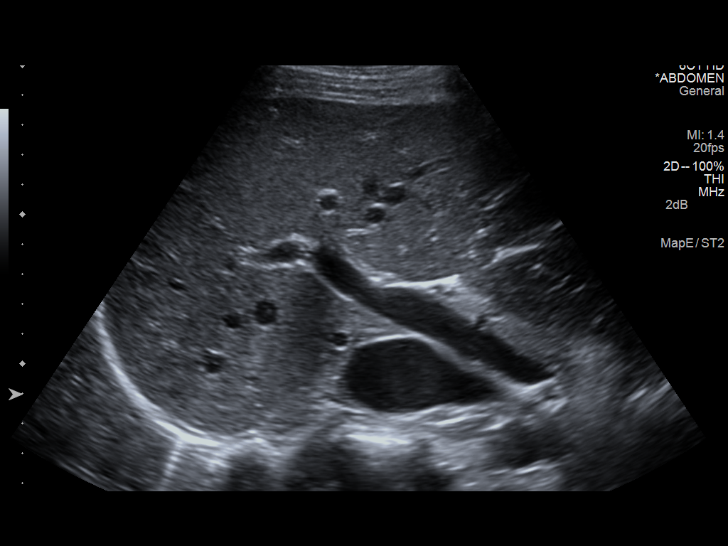
[im 31/58]
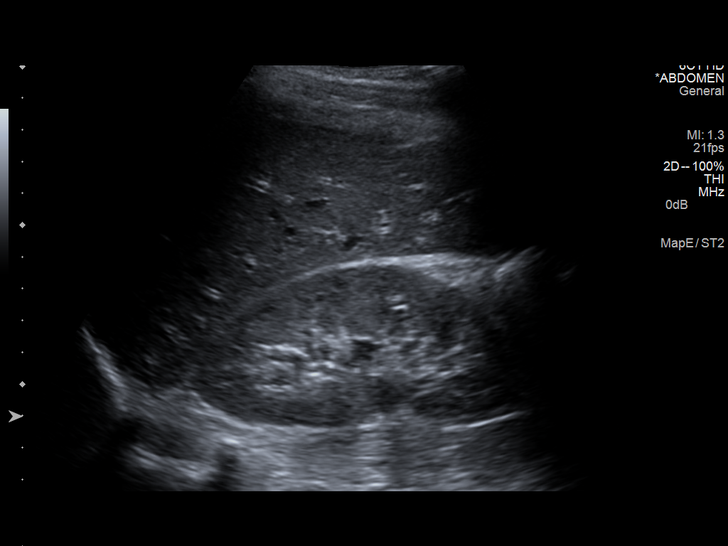
[im 36/58]
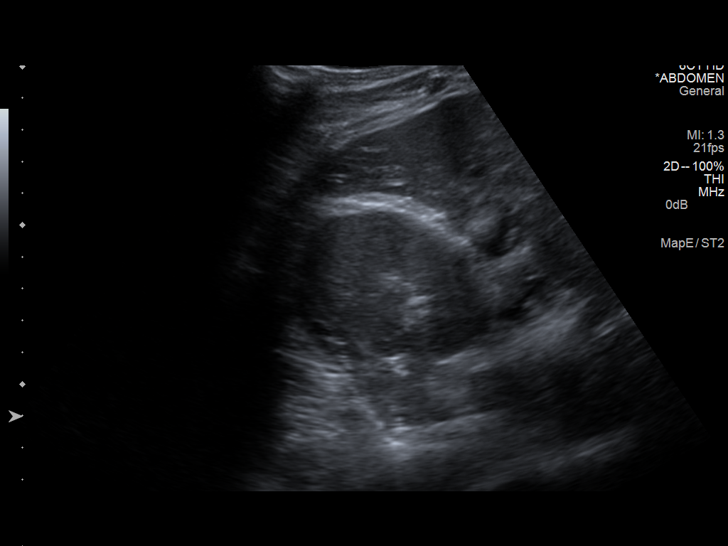
[im 39/58]
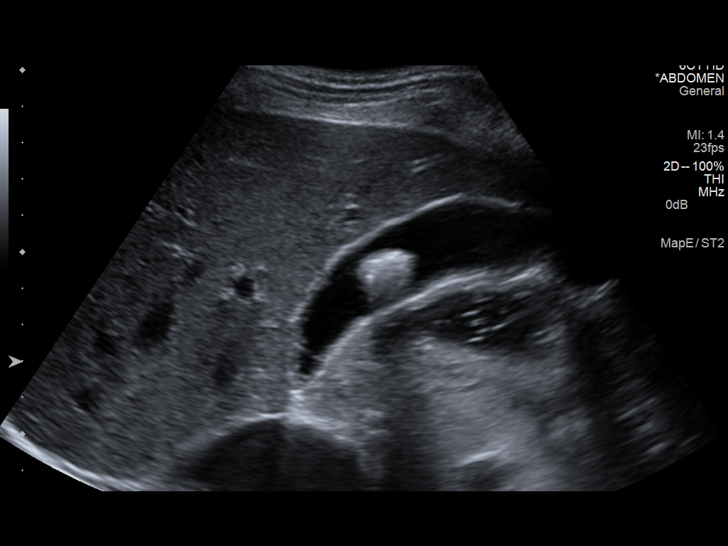
[im 43/58]
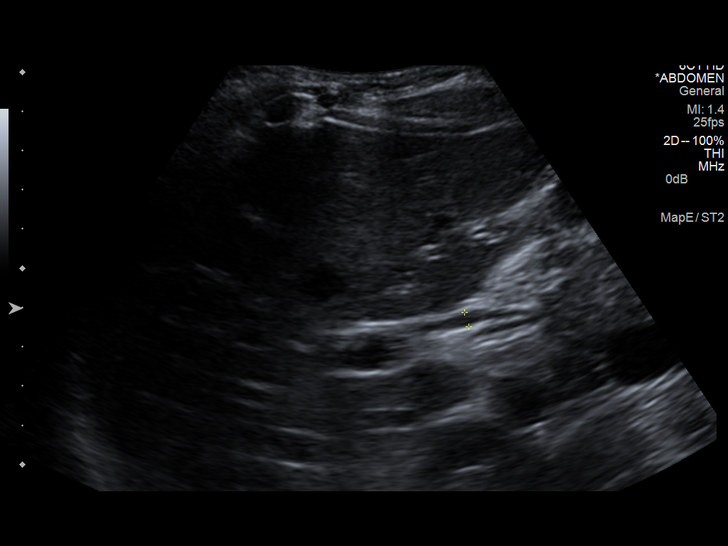
[im 48/58]
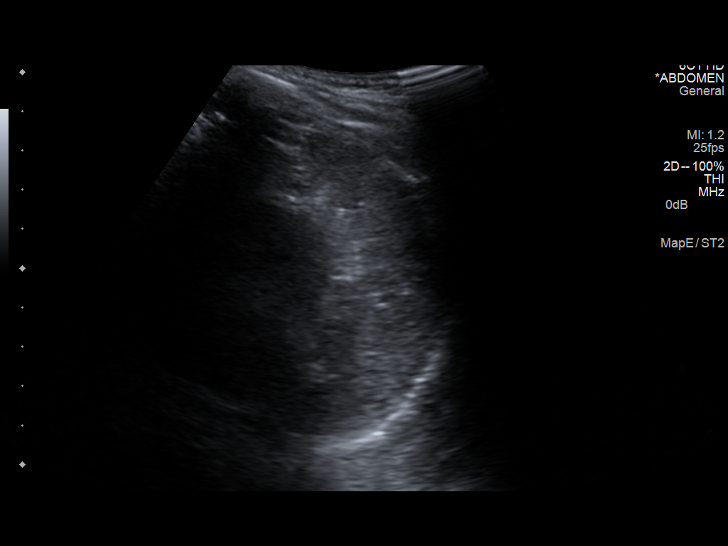
[im 53/58]
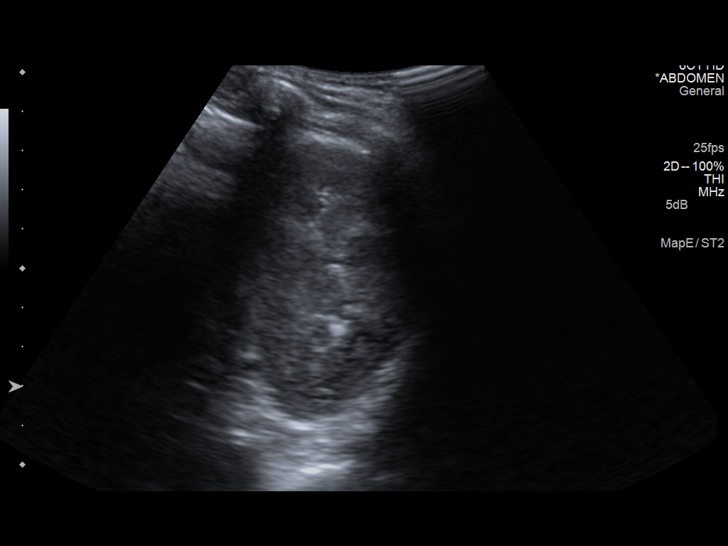
[im 58/58]
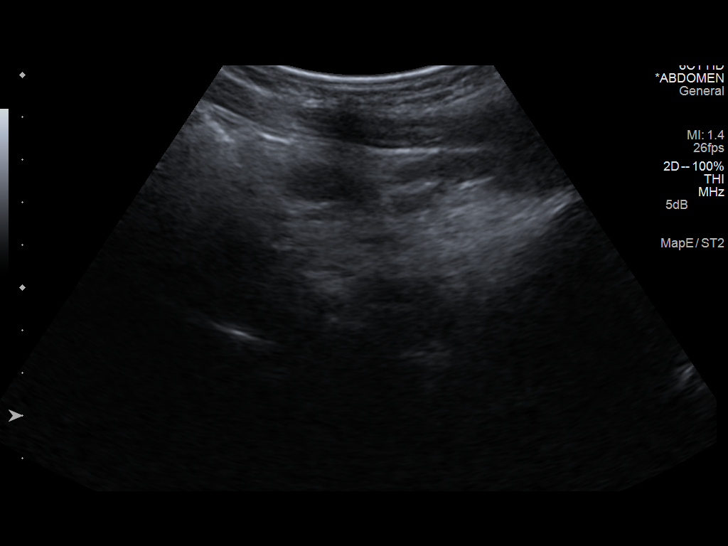

[14 of 25 positions shown; findings below may reference images not displayed]

FINDINGS: Gallbladder

Single, mobile stone within the gallbladder measures 1.7 cm.
Negative sonographic Murphy's sign.

Common bile duct

Diameter: 3.8 mm.

Liver

No focal lesion identified. Within normal limits in parenchymal
echogenicity.

IVC

No abnormality visualized.

Pancreas

Visualized portion unremarkable.

Spleen

Size and appearance within normal limits.

Right Kidney

Length: Measures 10.7 cm. Echogenicity within normal limits. No mass
or hydronephrosis visualized.

Left Kidney

Length: Measures 10.6 cm. Echogenicity within normal limits. No mass
or hydronephrosis visualized.

Abdominal aorta

No aneurysm visualized.
IMPRESSION: 1. No acute findings. 2. Gallstone.

## 2013-03-10 MED ORDER — SODIUM CHLORIDE 0.9 % IV BOLUS (SEPSIS)
1000.0000 mL | Freq: Once | INTRAVENOUS | Status: AC
  Administered 2013-03-10: 1000 mL via INTRAVENOUS

## 2013-03-10 MED ORDER — SODIUM CHLORIDE 0.9 % IV SOLN
Freq: Once | INTRAVENOUS | Status: AC
  Administered 2013-03-10: 13:00:00 via INTRAVENOUS

## 2013-03-10 MED ORDER — PROMETHAZINE HCL 25 MG RE SUPP: 25.0000 mg | Freq: Four times a day (QID) | RECTAL | Status: DC | PRN

## 2013-03-10 MED ORDER — ONDANSETRON HCL 4 MG/2ML IJ SOLN
4.0000 mg | Freq: Once | INTRAMUSCULAR | Status: AC
  Administered 2013-03-10: 4 mg via INTRAVENOUS
  Filled 2013-03-10: qty 2

## 2013-03-10 MED ORDER — RANITIDINE HCL 150 MG PO TABS: 150.0000 mg | ORAL_TABLET | Freq: Two times a day (BID) | ORAL | Status: DC

## 2013-03-10 MED ORDER — SODIUM CHLORIDE 0.9 % IV SOLN
10.0000 mg | Freq: Once | INTRAVENOUS | Status: AC
  Administered 2013-03-10: 10 mg via INTRAVENOUS
  Filled 2013-03-10: qty 1

## 2013-03-10 MED ORDER — MORPHINE SULFATE 4 MG/ML IJ SOLN
4.0000 mg | Freq: Once | INTRAMUSCULAR | Status: DC
  Filled 2013-03-10: qty 1

## 2013-03-10 MED ORDER — ONDANSETRON 8 MG PO TBDP: 8.0000 mg | ORAL_TABLET | Freq: Three times a day (TID) | ORAL | Status: DC | PRN

## 2013-03-10 NOTE — ED Provider Notes (Signed)
CSN: [id number]     Arrival date & time 03/10/13  7829 History   First MD Initiated Contact with Patient 03/10/13 1047     Chief Complaint  Patient presents with  . Emesis  . Fever   (Consider location/radiation/quality/duration/timing/severity/associated sxs/prior Treatment) HPI Comments: Pt comes in with cc of abd pain. Pt states that she has been having some nausea and emesis x 2-3 weeks, along with epigastric pain. Today, she started having burning type pain on the LUQ, and decided to come to the ER. The pain are constant, non radiating. No BM x 3 days, but she is passing flatus, has hx of c-section, and TL. She has no lower quadrant pain, and no uti like sx, no vaginal discharge or bleeding.  Patient is a 32 y.o. female presenting with vomiting and fever. The history is provided by the patient.  Emesis Associated symptoms: abdominal pain   Associated symptoms: no chills and no headaches   Fever Associated symptoms: nausea and vomiting   Associated symptoms: no chest pain, no chills, no dysuria and no headaches     Past Medical History  Diagnosis Date  . Hypertension   . Insomnia   . Anemia   . Bronchitis    Past Surgical History  Procedure Laterality Date  . Cesarean section    . Tubal ligation     Family History  Problem Relation Age of Onset  . Cancer Mother     colon  . Arthritis Mother   . Hypertension Father   . Liver disease Father   . Stroke Father   . Diabetes Paternal Grandmother   . Hypertension Paternal Grandmother   . Heart disease Paternal Grandmother    History  Substance Use Topics  . Smoking status: Former Smoker -- 0.25 packs/day for 12 years  . Smokeless tobacco: Never Used  . Alcohol Use: Yes   OB History   Grav Para Term Preterm Abortions TAB SAB Ect Mult Living   9 4 1 3 5  5   4      Review of Systems  Constitutional: Positive for activity change and fatigue. Negative for fever and chills.  HENT: Negative for neck pain.    Respiratory: Negative for shortness of breath.   Cardiovascular: Negative for chest pain.  Gastrointestinal: Positive for nausea, vomiting and abdominal pain. Negative for blood in stool.  Genitourinary: Negative for dysuria.  Neurological: Positive for weakness. Negative for headaches.    Allergies  Aspirin; Hydromorphone hcl; and Morphine  Home Medications   Current Outpatient Rx  Name  Route  Sig  Dispense  Refill  . ondansetron (ZOFRAN ODT) 8 MG disintegrating tablet   Oral   Take 1 tablet (8 mg total) by mouth every 8 (eight) hours as needed for nausea.   20 tablet   0   . promethazine (PHENERGAN) 25 MG suppository   Rectal   Place 1 suppository (25 mg total) rectally every 6 (six) hours as needed for nausea.   12 each   0   . ranitidine (ZANTAC) 150 MG tablet   Oral   Take 1 tablet (150 mg total) by mouth 2 (two) times daily.   60 tablet   0    BP 154/83  Pulse 80  Temp(Src) 98.4 F (36.9 C) (Oral)  Resp 18  SpO2 100%  LMP 02/24/2013 Physical Exam  Nursing note and vitals reviewed. Constitutional: She is oriented to person, place, and time. She appears well-developed and well-nourished.  HENT:  Head:  Normocephalic and atraumatic.  Eyes: EOM are normal. Pupils are equal, round, and reactive to light.  Neck: Neck supple.  Cardiovascular: Normal rate, regular rhythm and normal heart sounds.   No murmur heard. Pulmonary/Chest: Effort normal. No respiratory distress.  Abdominal: Soft. She exhibits no distension. There is tenderness. There is no rebound and no guarding.  Epigastric and RUQ tenderness. No CVA tenderness.  Neurological: She is alert and oriented to person, place, and time.  Skin: Skin is warm and dry.    ED Course  Procedures (including critical care time) Labs Review Labs Reviewed  URINALYSIS, ROUTINE W REFLEX MICROSCOPIC - Abnormal; Notable for the following:    Color, Urine AMBER (*)    APPearance CLOUDY (*)    All other components  within normal limits  CBC WITH DIFFERENTIAL  BASIC METABOLIC PANEL  HEPATIC FUNCTION PANEL  LIPASE, BLOOD  POCT PREGNANCY, URINE   Imaging Review US Abdomen Complete  03/10/2013   CLINICAL DATA:  Right upper quadrant pain  EXAM: ABDOMEN ULTRASOUND  COMPARISON:  None.  FINDINGS: Gallbladder  Single, mobile stone within the gallbladder measures 1.7 cm. Negative sonographic Murphy's sign.  Common bile duct  Diameter: 3.8 mm.  Liver  No focal lesion identified. Within normal limits in parenchymal echogenicity.  IVC  No abnormality visualized.  Pancreas  Visualized portion unremarkable.  Spleen  Size and appearance within normal limits.  Right Kidney  Length: Measures 10.7 cm. Echogenicity within normal limits. No mass or hydronephrosis visualized.  Left Kidney  Length: Measures 10.6 cm. Echogenicity within normal limits. No mass or hydronephrosis visualized.  Abdominal aorta  No aneurysm visualized.  IMPRESSION: 1. No acute findings. 2. Gallstone.   Electronically Signed   By: Signa Kell M.D.   On: 03/10/2013 12:44   Dg Abd Acute W/chest  03/10/2013   CLINICAL DATA:  Mid and left abdominal pain. Nausea. Chest pain  EXAM: ACUTE ABDOMEN SERIES (ABDOMEN 2 VIEW & CHEST 1 VIEW)  COMPARISON:  08/07/2011.  FINDINGS: There is no evidence of dilated bowel loops or free intraperitoneal air. No radiopaque calculi or other significant radiographic abnormality is seen. Heart size and mediastinal contours are within normal limits. Both lungs are clear. Scoliosis deformity involves the thoracic and lumbar spine.  IMPRESSION: Negative abdominal radiographs.  No acute cardiopulmonary disease.   Electronically Signed   By: Signa Kell M.D.   On: 03/10/2013 11:46    MDM   1. Gastritis   2. Abdominal pain    DDx includes: Pancreatitis Hepatobiliary pathology including cholecystitis Gastritis/PUD SBO ACS syndrome Aortic Dissection  Pt comes in with cc of nausea and emesis x 1 week, with abd pain   Epigastric. She also has new onset left upper quadrant abd pain.  No DM, + surgical hx, with may be constipation/  GI labs, and [place] abd. And AAS show no evidence of gall stones, SBO, constipation.  Suspect possible gastritis. No risk for gastroparesis.  Requested patient to see PCP, and get a GI follow up if needed.   Derwood Kaplan, MD 03/10/13 1416

## 2013-03-10 NOTE — ED Notes (Signed)
Per pt, states vomiting, fever, chills for 3 weeks-trying to treat herself-states she has lost 20 lbs

## 2013-03-12 ENCOUNTER — Encounter (HOSPITAL_COMMUNITY): Payer: Self-pay | Admitting: Emergency Medicine

## 2013-03-12 ENCOUNTER — Emergency Department (HOSPITAL_COMMUNITY)
Admission: EM | Admit: 2013-03-12 | Discharge: 2013-03-12 | Disposition: A | Discharge status: Home / Self Care | Payer: Medicaid Other | Attending: Emergency Medicine | Admitting: Emergency Medicine

## 2013-03-12 DIAGNOSIS — Z3202 Encounter for pregnancy test, result negative: Secondary | ICD-10-CM | POA: Insufficient documentation

## 2013-03-12 DIAGNOSIS — R112 Nausea with vomiting, unspecified: Secondary | ICD-10-CM | POA: Insufficient documentation

## 2013-03-12 DIAGNOSIS — K219 Gastro-esophageal reflux disease without esophagitis: Secondary | ICD-10-CM | POA: Insufficient documentation

## 2013-03-12 DIAGNOSIS — I10 Essential (primary) hypertension: Secondary | ICD-10-CM | POA: Insufficient documentation

## 2013-03-12 DIAGNOSIS — Z79899 Other long term (current) drug therapy: Secondary | ICD-10-CM | POA: Insufficient documentation

## 2013-03-12 DIAGNOSIS — K8020 Calculus of gallbladder without cholecystitis without obstruction: Secondary | ICD-10-CM | POA: Insufficient documentation

## 2013-03-12 DIAGNOSIS — Z862 Personal history of diseases of the blood and blood-forming organs and certain disorders involving the immune mechanism: Secondary | ICD-10-CM | POA: Insufficient documentation

## 2013-03-12 DIAGNOSIS — Z8709 Personal history of other diseases of the respiratory system: Secondary | ICD-10-CM | POA: Insufficient documentation

## 2013-03-12 HISTORY — DX: Other seasonal allergic rhinitis: J30.2

## 2013-03-12 HISTORY — DX: Migraine, unspecified, not intractable, without status migrainosus: G43.909

## 2013-03-12 HISTORY — DX: Gastro-esophageal reflux disease without esophagitis: K21.9

## 2013-03-12 LAB — COMPREHENSIVE METABOLIC PANEL
ALT: 10 U/L (ref 0–35)
AST: 17 U/L (ref 0–37)
Albumin: 4.2 g/dL (ref 3.5–5.2)
Alkaline Phosphatase: 72 U/L (ref 39–117)
CO2: 25 mEq/L (ref 19–32)
Chloride: 100 mEq/L (ref 96–112)
GFR calc Af Amer: >90 mL/min (ref >90)
GFR calc non Af Amer: >90 mL/min (ref >90)
Glucose, Bld: 87 mg/dL (ref 70–99)
Potassium: 3.4 mEq/L — ABNORMAL LOW (ref 3.5–5.1)
Sodium: 138 mEq/L (ref 135–145)
Total Bilirubin: 0.3 mg/dL (ref 0.3–1.2)
Total Protein: 7.8 g/dL (ref 6.0–8.3)

## 2013-03-12 LAB — URINALYSIS, ROUTINE W REFLEX MICROSCOPIC
Bilirubin Urine: NEGATIVE
Glucose, UA: NEGATIVE mg/dL
Hgb urine dipstick: NEGATIVE
Protein, ur: 30 mg/dL — AB
Specific Gravity, Urine: 1.029 (ref 1.005–1.030)
Urobilinogen, UA: 1 mg/dL (ref 0.0–1.0)

## 2013-03-12 LAB — URINE MICROSCOPIC-ADD ON

## 2013-03-12 LAB — CBC WITH DIFFERENTIAL/PLATELET
Basophils Relative: 1 % (ref 0–1)
Eosinophils Absolute: 0.1 10*3/uL (ref 0.0–0.7)
Eosinophils Relative: 1 % (ref 0–5)
HCT: 37.9 % (ref 36.0–46.0)
Lymphocytes Relative: 30 % (ref 12–46)
Lymphs Abs: 2.2 10*3/uL (ref 0.7–4.0)
MCH: 31 pg (ref 26.0–34.0)
MCV: 89 fL (ref 78.0–100.0)
Monocytes Absolute: 0.5 10*3/uL (ref 0.1–1.0)
Platelets: 366 10*3/uL (ref 150–400)
RBC: 4.26 MIL/uL (ref 3.87–5.11)
RDW: 13.1 % (ref 11.5–15.5)

## 2013-03-12 LAB — PREGNANCY, URINE: Preg Test, Ur: NEGATIVE

## 2013-03-12 MED ORDER — FENTANYL CITRATE 0.05 MG/ML IJ SOLN
50.0000 ug | Freq: Once | INTRAMUSCULAR | Status: AC
  Administered 2013-03-12: 50 ug via INTRAVENOUS
  Filled 2013-03-12: qty 2

## 2013-03-12 MED ORDER — ONDANSETRON HCL 4 MG/2ML IJ SOLN
4.0000 mg | Freq: Once | INTRAMUSCULAR | Status: AC
  Administered 2013-03-12: 4 mg via INTRAVENOUS
  Filled 2013-03-12: qty 2

## 2013-03-12 MED ORDER — SODIUM CHLORIDE 0.9 % IV SOLN: 1000.0000 mL | INTRAVENOUS | Status: DC

## 2013-03-12 MED ORDER — SODIUM CHLORIDE 0.9 % IV SOLN
1000.0000 mL | Freq: Once | INTRAVENOUS | Status: AC
  Administered 2013-03-12: 1000 mL via INTRAVENOUS

## 2013-03-12 MED ORDER — HYDROCODONE-ACETAMINOPHEN 5-325 MG PO TABS: 1.0000 | ORAL_TABLET | Freq: Four times a day (QID) | ORAL | Status: DC | PRN

## 2013-03-12 MED ORDER — ONDANSETRON 8 MG PO TBDP: 8.0000 mg | ORAL_TABLET | Freq: Three times a day (TID) | ORAL | Status: DC | PRN

## 2013-03-12 NOTE — ED Provider Notes (Signed)
CSN: [id number]     Arrival date & time 03/12/13  1806 History   First MD Initiated Contact with Patient 03/12/13 1914     Chief Complaint  Patient presents with  . Hematemesis    HPI Pt has been having pain in her upper abdomen and periumbilical region for the last month. The symptoms started about a month ago.  When she tries to eat she will vomit.  She has vomited four times today and usually vomits 6 times daily.  She has not had any blood in her stool.  No diarrhea.  She feels like she has had a fever up to 101.  She was seen in the ED and had an ultrasound that showed a gallstone.  She came back to the ED because she noticed some blood in her emesis.  No urinary symptoms.  No vaginal discharge.    Past Medical History  Diagnosis Date  . Hypertension   . Insomnia   . Anemia   . Bronchitis   . GERD (gastroesophageal reflux disease)   . Migraines   . Seasonal allergies    Past Surgical History  Procedure Laterality Date  . Cesarean section    . Tubal ligation    . Ovary surgery     Family History  Problem Relation Age of Onset  . Cancer Mother     colon  . Arthritis Mother   . Hypertension Father   . Liver disease Father   . Stroke Father   . Diabetes Paternal Grandmother   . Hypertension Paternal Grandmother   . Heart disease Paternal Grandmother    History  Substance Use Topics  . Smoking status: Light Tobacco Smoker -- 0.25 packs/day for 12 years  . Smokeless tobacco: Never Used  . Alcohol Use: Yes     Comment: socially   OB History   Grav Para Term Preterm Abortions TAB SAB Ect Mult Living   9 4 1 3 5  5   4      Review of Systems  All other systems reviewed and are negative.    Allergies  Aspirin; Hydromorphone hcl; and Morphine  Home Medications   Current Outpatient Rx  Name  Route  Sig  Dispense  Refill  . ondansetron (ZOFRAN ODT) 8 MG disintegrating tablet   Oral   Take 1 tablet (8 mg total) by mouth every 8 (eight) hours as needed for  nausea.   20 tablet   0   . promethazine (PHENERGAN) 25 MG suppository   Rectal   Place 1 suppository (25 mg total) rectally every 6 (six) hours as needed for nausea.   12 each   0   . ranitidine (ZANTAC) 150 MG tablet   Oral   Take 1 tablet (150 mg total) by mouth 2 (two) times daily.   60 tablet   0   . HYDROcodone-acetaminophen (NORCO) 5-325 MG per tablet   Oral   Take 1-2 tablets by mouth every 6 (six) hours as needed for pain.   16 tablet   0   . ondansetron (ZOFRAN ODT) 8 MG disintegrating tablet   Oral   Take 1 tablet (8 mg total) by mouth every 8 (eight) hours as needed for nausea.   20 tablet   0    BP 152/95  Pulse 66  Temp(Src) 98.4 F (36.9 C) (Oral)  Resp 19  Ht 5' (1.524 m)  Wt 117 lb (53.071 kg)  BMI 22.85 kg/m2  SpO2 100%  LMP 02/24/2013  Physical Exam  Nursing note and vitals reviewed. Constitutional: She appears well-developed and well-nourished. No distress.  HENT:  Head: Normocephalic and atraumatic.  Right Ear: External ear normal.  Left Ear: External ear normal.  Eyes: Conjunctivae are normal. Right eye exhibits no discharge. Left eye exhibits no discharge. No scleral icterus.  Neck: Neck supple. No tracheal deviation present.  Cardiovascular: Normal rate, regular rhythm and intact distal pulses.   Pulmonary/Chest: Effort normal and breath sounds normal. No stridor. No respiratory distress. She has no wheezes. She has no rales.  Abdominal: Soft. Bowel sounds are normal. She exhibits no distension. There is tenderness in the right upper quadrant, epigastric area and left upper quadrant. There is no rebound, no guarding and no CVA tenderness. No hernia.  Musculoskeletal: She exhibits no edema and no tenderness.  Neurological: She is alert. She has normal strength. No sensory deficit. Cranial nerve deficit:  no gross defecits noted. She exhibits normal muscle tone. She displays no seizure activity. Coordination normal.  Skin: Skin is warm and  dry. No rash noted.  Psychiatric: She has a normal mood and affect.    ED Course  Procedures (including critical care time) Labs Review Labs Reviewed  COMPREHENSIVE METABOLIC PANEL - Abnormal; Notable for the following:    Potassium 3.4 (*)    All other components within normal limits  LIPASE, BLOOD - Abnormal; Notable for the following:    Lipase 91 (*)    All other components within normal limits  URINALYSIS, ROUTINE W REFLEX MICROSCOPIC - Abnormal; Notable for the following:    Color, Urine AMBER (*)    APPearance CLOUDY (*)    Ketones, ur >80 (*)    Protein, ur 30 (*)    Leukocytes, UA TRACE (*)    All other components within normal limits  URINE MICROSCOPIC-ADD ON - Abnormal; Notable for the following:    Squamous Epithelial / LPF MANY (*)    Bacteria, UA MANY (*)    All other components within normal limits  URINE CULTURE  CBC WITH DIFFERENTIAL  PREGNANCY, URINE   Imaging Review No results found.  MDM   1. Nausea and vomiting   2. Gallstones   3. HTN (hypertension)    Pt hydrated and treated with pain medications.  Gallstone noted on previous visit although I am not sure that is the soul source of her symptoms.  Pain does not seem to be localized in epigastrium and RUQ.   Rec follow up with GI, general surgery for further evaluation.  Doubt acute cholecystitis, acute GI bleed or other emergency medical condition at this time.  Precautions given.    Celene Kras, MD 03/13/13 [phone]

## 2013-03-12 NOTE — ED Notes (Signed)
Pt presents to ED via POV.  Pt c/o of ongoing vomiting for the last month with recent blood/coffee ground emesis.  Pt also complains of indigestion and Courts colored tongue.  Pt states pain is 8 out of 10 in the abdomen.

## 2013-03-13 LAB — URINE CULTURE

## 2013-03-13 NOTE — Progress Notes (Signed)
Received call from May at [facility] [phone]) regarding discharging prescription of zofran. States, "patient can not afford medication. Can we change it to phenergan?" EDCM spoke with Dr. Hyacinth Meeker, EDP who gave permission to change it over to phenergan 12.5 mg one by mouth every 6 hours as needed for nausea dispense 12 tablets. Relayed change to May who read back and voiced understanding. No other needs identified.

## 2013-04-05 ENCOUNTER — Ambulatory Visit (INDEPENDENT_AMBULATORY_CARE_PROVIDER_SITE_OTHER): Payer: Medicaid Other | Admitting: Surgery

## 2013-04-08 ENCOUNTER — Encounter (INDEPENDENT_AMBULATORY_CARE_PROVIDER_SITE_OTHER): Payer: Self-pay | Admitting: Surgery

## 2013-04-25 ENCOUNTER — Other Ambulatory Visit: Payer: Self-pay

## 2013-05-20 ENCOUNTER — Encounter (HOSPITAL_COMMUNITY): Payer: Self-pay | Admitting: Emergency Medicine

## 2013-05-20 ENCOUNTER — Emergency Department (HOSPITAL_COMMUNITY)
Admission: EM | Admit: 2013-05-20 | Discharge: 2013-05-21 | Discharge status: Against Medical Advice | Payer: Medicaid Other | Attending: Emergency Medicine | Admitting: Emergency Medicine

## 2013-05-20 DIAGNOSIS — R1012 Left upper quadrant pain: Secondary | ICD-10-CM | POA: Insufficient documentation

## 2013-05-20 DIAGNOSIS — R630 Anorexia: Secondary | ICD-10-CM | POA: Insufficient documentation

## 2013-05-20 DIAGNOSIS — F17200 Nicotine dependence, unspecified, uncomplicated: Secondary | ICD-10-CM | POA: Insufficient documentation

## 2013-05-20 DIAGNOSIS — K920 Hematemesis: Secondary | ICD-10-CM | POA: Insufficient documentation

## 2013-05-20 DIAGNOSIS — I10 Essential (primary) hypertension: Secondary | ICD-10-CM | POA: Insufficient documentation

## 2013-05-20 HISTORY — DX: Acute pancreatitis without necrosis or infection, unspecified: K85.90

## 2013-05-20 LAB — COMPREHENSIVE METABOLIC PANEL
ALT: 10 U/L (ref 0–35)
BUN: 16 mg/dL (ref 6–23)
CO2: 29 mEq/L (ref 19–32)
Calcium: 9.5 mg/dL (ref 8.4–10.5)
Chloride: 105 mEq/L (ref 96–112)
Creatinine, Ser: 0.85 mg/dL (ref 0.50–1.10)
GFR calc Af Amer: >90 mL/min (ref >90)
GFR calc non Af Amer: >90 mL/min (ref >90)
Glucose, Bld: 66 mg/dL — ABNORMAL LOW (ref 70–99)
Total Bilirubin: 0.2 mg/dL — ABNORMAL LOW (ref 0.3–1.2)
Total Protein: 7.4 g/dL (ref 6.0–8.3)

## 2013-05-20 LAB — CBC
Hemoglobin: 11.9 g/dL — ABNORMAL LOW (ref 12.0–15.0)
MCH: 30.4 pg (ref 26.0–34.0)
MCHC: 33.5 g/dL (ref 30.0–36.0)
Platelets: 379 10*3/uL (ref 150–400)
RBC: 3.91 MIL/uL (ref 3.87–5.11)
WBC: 6.9 10*3/uL (ref 4.0–10.5)

## 2013-05-20 NOTE — ED Notes (Signed)
On 9/23 pt was dx with gallstones and pancreatitis, and has had pain since then. Pt states she has LUQ abdominal pain, burning, that has increased over the past few days. Pt reports vomiting blood with decreased appetite since Thanksgiving.

## 2013-05-21 LAB — LIPASE, BLOOD: Lipase: 32 U/L (ref 11–59)

## 2013-05-21 NOTE — ED Notes (Signed)
Spoke to lab stated lipase will be ran off of blood in lab

## 2013-06-20 DIAGNOSIS — K859 Acute pancreatitis without necrosis or infection, unspecified: Secondary | ICD-10-CM

## 2013-06-20 HISTORY — DX: Acute pancreatitis without necrosis or infection, unspecified: K85.90

## 2013-08-14 ENCOUNTER — Ambulatory Visit (INDEPENDENT_AMBULATORY_CARE_PROVIDER_SITE_OTHER): Payer: Medicaid Other | Admitting: Surgery

## 2013-08-14 ENCOUNTER — Encounter (INDEPENDENT_AMBULATORY_CARE_PROVIDER_SITE_OTHER): Payer: Self-pay | Admitting: Surgery

## 2013-08-14 VITALS — BP 114/76 | HR 66 | Temp 99.0°F | Resp 16 | Ht 60.0 in | Wt 129.4 lb

## 2013-08-14 DIAGNOSIS — K811 Chronic cholecystitis: Secondary | ICD-10-CM

## 2013-08-14 NOTE — Progress Notes (Signed)
Chief Complaint:  History of symptomatic gallstones and brief elevation in lipase  History of Present Illness:  Kayla Dunn is an 33 y.o. female seen in emergency room last fall with severe upper abdominal pain and known gallstones. Lipase was elevated at 90. Since then she has lost about 30 pounds. She has upper abdominal pain and also lower bowel pain when she eats. She likely go ahead and have her gallbladder removed and hopefully her symptoms will improve.  I explained laparoscopic cholecystectomy to her in some detail and gave her a booklet on this. I explained that there is a risk of common bile duct injury which is the most significant risk of the surgery as well as other risks just of having surgery with the laparoscope not limited to bile leaks bowel injuries etc. She likely due to scheduling likely due to as an outpatient. She's up and to return to her prior work.  Past Medical History  Diagnosis Date  . Hypertension   . Insomnia   . Anemia   . Bronchitis   . GERD (gastroesophageal reflux disease)   . Migraines   . Seasonal allergies   . Pancreatitis     Past Surgical History  Procedure Laterality Date  . Cesarean section    . Tubal ligation    . Ovary surgery      Current Outpatient Prescriptions  Medication Sig Dispense Refill  . DiphenhydrAMINE HCl (BENADRYL ALLERGY PO) Take by mouth.      . Pseudoeph-Doxylamine-DM-APAP (NYQUIL MULTI-SYMPTOM PO) Take 30 mLs by mouth every 12 (twelve) hours.      . Ranitidine HCl (ZANTAC PO) Take by mouth.       No current facility-administered medications for this visit.   Aspirin; Hydromorphone hcl; and Morphine Family History  Problem Relation Age of Onset  . Cancer Mother     colon  . Arthritis Mother   . Hypertension Father   . Liver disease Father   . Stroke Father   . Diabetes Paternal Grandmother   . Hypertension Paternal Grandmother   . Heart disease Paternal Grandmother    Social History:   reports that she has  been smoking.  She has never used smokeless tobacco. She reports that she uses illicit drugs (Marijuana). She reports that she does not drink alcohol.   REVIEW OF SYSTEMS - PERTINENT POSITIVES ONLY: No history of DVT.  Physical Exam:   Blood pressure 114/76, pulse 66, temperature 99 F (37.2 C), temperature source Temporal, resp. rate 16, height 5' (1.524 m), weight 129 lb 6.4 oz (58.695 kg). Body mass index is 25.27 kg/(m^2).  Gen:  WDWN African American female NAD  Neurological: Alert and oriented to person, place, and time. Motor and sensory function is grossly intact  Head: Normocephalic and atraumatic.  Eyes: Conjunctivae are normal. Pupils are equal, round, and reactive to light. No scleral icterus.  Neck: Normal range of motion. Neck supple. No tracheal deviation or thyromegaly present.  Cardiovascular:  SR without murmurs or gallops.  No carotid bruits Respiratory: Effort normal.  No respiratory distress. No chest wall tenderness. Breath sounds normal.  No wheezes, rales or rhonchi.  Abdomen:  Flat and with some discomfort in the upper abdomen but no rebound or guarding. GU: Musculoskeletal: Normal range of motion. Extremities are nontender. No cyanosis, edema or clubbing noted Lymphadenopathy: No cervical, preauricular, postauricular or axillary adenopathy is present Skin: Skin is warm and dry. No rash noted. No diaphoresis. No erythema. No pallor. Pscyh: Normal mood and  affect. Behavior is normal. Judgment and thought content normal.   LABORATORY RESULTS: No results found for this or any previous visit (from the past 48 hour(s)).  RADIOLOGY RESULTS: No results found.  Problem List: Patient Active Problem List   Diagnosis Date Noted  . TWIN GESTATION 04/28/2010  . RH FACTOR, NEGATIVE 03/24/2010  . GERD 03/07/2008  . OBESITY, NOS 08/17/2006  . ANEMIA, IRON DEFICIENCY, UNSPEC. 08/17/2006  . DEPRESSION, MAJOR, RECURRENT 08/17/2006  . TOBACCO DEPENDENCE 08/17/2006  .  MIGRAINE, UNSPEC., W/O INTRACTABLE MIGRAINE 08/17/2006  . INSOMNIA NOS 08/17/2006  . PAPANICOLAOU SMEAR, ABNORMAL 08/17/2006  . PAPANICOLAOU SMEAR, ABNORMAL 08/17/2006    Assessment & Plan: Chronic cholecystitis cholelithiasis plan laparoscopic cholecystectomy.    Matt B. Daphine DeutscherMartin, MD, [facility]  [facility] [phone] beeper [phone]  08/14/2013 2:18 PM

## 2013-08-14 NOTE — Patient Instructions (Signed)
Laparoscopic Cholecystectomy °Laparoscopic cholecystectomy is surgery to remove the gallbladder. The gallbladder is located in the upper right part of the abdomen, behind the liver. It is a storage sac for bile produced in the liver. Bile aids in the digestion and absorption of fats. Cholecystectomy is often done for inflammation of the gallbladder (cholecystitis). This condition is usually caused by a buildup of gallstones (cholelithiasis) in your gallbladder. Gallstones can block the flow of bile, resulting in inflammation and pain. In severe cases, emergency surgery may be required. When emergency surgery is not required, you will have time to prepare for the procedure. °Laparoscopic surgery is an alternative to open surgery. Laparoscopic surgery has a shorter recovery time. Your common bile duct may also need to be examined during the procedure. If stones are found in the common bile duct, they may be removed. °LET YOUR HEALTH CARE PROVIDER KNOW ABOUT: °· Any allergies you have. °· All medicines you are taking, including vitamins, herbs, eye drops, creams, and over-the-counter medicines. °· Previous problems you or members of your family have had with the use of anesthetics. °· Any blood disorders you have. °· Previous surgeries you have had. °· Medical conditions you have. °RISKS AND COMPLICATIONS °Generally, this is a safe procedure. However, as with any procedure, complications can occur. Possible complications include: °· Infection. °· Damage to the common bile duct, nerves, arteries, veins, or other internal organs such as the stomach, liver, or intestines. °· Bleeding. °· A stone may remain in the common bile duct. °· A bile leak from the cyst duct that is clipped when your gallbladder is removed. °· The need to convert to open surgery, which requires a larger incision in the abdomen. This may be necessary if your surgeon thinks it is not safe to continue with a laparoscopic procedure. °BEFORE THE  PROCEDURE °· Ask your health care provider about changing or stopping any regular medicines. You will need to stop taking aspirin or blood thinners at least 5 days prior to surgery. °· Do not eat or drink anything after midnight the night before surgery. °· Let your health care provider know if you develop a cold or other infectious problem before surgery. °PROCEDURE  °· You will be given medicine to make you sleep through the procedure (general anesthetic). A breathing tube will be placed in your mouth. °· When you are asleep, your surgeon will make several small cuts (incisions) in your abdomen. °· A thin, lighted tube with a tiny camera on the end (laparoscope) is inserted through one of the small incisions. The camera on the laparoscope sends a picture to a TV screen in the operating room. This gives the surgeon a good view inside your abdomen. °· A gas will be pumped into your abdomen. This expands your abdomen so that the surgeon has more room to perform the surgery. °· Other tools needed for the procedure are inserted through the other incisions. The gallbladder is removed through one of the incisions. °· After the removal of your gallbladder, the incisions will be closed with stitches, staples, or skin glue. °AFTER THE PROCEDURE °· You will be taken to a recovery area where your progress will be checked often. °· You may be allowed to go home the same day if your pain is controlled and you can tolerate liquids. °Document Released: 06/06/2005 Document Revised: 03/27/2013 Document Reviewed: 01/16/2013 °[employer]® Patient Information ©2014 [employer]. ° °

## 2013-08-20 NOTE — Progress Notes (Signed)
Need orders in EPIC.  Surgery scheduled on 3/10.  Proep on 08/23/13 at 0900am.  Thank You.

## 2013-08-21 ENCOUNTER — Encounter (HOSPITAL_COMMUNITY): Payer: Self-pay | Admitting: Pharmacy Technician

## 2013-08-21 NOTE — Progress Notes (Signed)
Need orders in EPIC.  Surgery on 08/27/13 Preop on 08/23/13 at 0900am.  Need orders in EPIC.  Thank You.

## 2013-08-22 ENCOUNTER — Other Ambulatory Visit (HOSPITAL_COMMUNITY): Payer: Self-pay | Admitting: Surgery

## 2013-08-22 NOTE — Patient Instructions (Addendum)
[address]i  08/22/2013   Your procedure is scheduled on: Tuesday March 10th  [facility] at 900 AM.  Call this number if you have problems the morning of surgery [phone]   Remember:  Do not eat food or drink liquids :After Midnight. Monday NIGHT     Take these medicines the morning of surgery with A SIP OF WATER: ZANTAC                                SEE [place] PREPARING FOR SURGERY SHEET             You may not have any metal on your body including hair pins and piercings  Do not wear jewelry, make-up.  Do not wear lotions, powders, or perfumes. No  Deodorant is to be worn.   Men may shave face and neck.  Do not bring valuables to the hospital. [facility] IS NOT RESPONSIBLE FOR VALUEABLES.  Contacts, dentures or bridgework may not be worn into surgery.  Leave suitcase in the car. After surgery it may be brought to your room.  For patients admitted to the hospital, checkout time is 11:00 AM the day of discharge.   Patients discharged the day of surgery will not be allowed to drive home.  Name and phone number of your driver:  Mom  [phone]    Special Instructions: N/A  Please read over the following fact sheets that you were given: [facility] Preparing [facility] sheet,  incentive spirometer sheet, blood fact sheet, MRSA information     FAILURE TO FOLLOW THESE INSTRUCTIONS MAY RESULT IN THE CANCELLATION OF YOUR SURGERY.  PATIENT SIGNATURE___________________________________________  NURSE SIGNATURE_____________________________________________

## 2013-08-22 NOTE — Progress Notes (Signed)
Need orders in epic for 08-27-13 surgery pre op is 08-23-13 1130 am

## 2013-08-22 NOTE — Progress Notes (Signed)
Dg abd with 1 view chest 03-10-13 epic

## 2013-08-23 ENCOUNTER — Encounter (HOSPITAL_COMMUNITY): Payer: Self-pay

## 2013-08-23 ENCOUNTER — Inpatient Hospital Stay (HOSPITAL_COMMUNITY): Admission: RE | Admit: 2013-08-23 | Payer: Medicaid Other | Source: Ambulatory Visit

## 2013-08-23 ENCOUNTER — Encounter (HOSPITAL_COMMUNITY)
Admission: RE | Admit: 2013-08-23 | Discharge: 2013-08-23 | Disposition: A | Discharge status: Home / Self Care | Payer: Medicaid Other | Source: Ambulatory Visit | Attending: Surgery | Admitting: Surgery

## 2013-08-23 DIAGNOSIS — Z01810 Encounter for preprocedural cardiovascular examination: Secondary | ICD-10-CM | POA: Insufficient documentation

## 2013-08-23 DIAGNOSIS — Z01812 Encounter for preprocedural laboratory examination: Secondary | ICD-10-CM | POA: Insufficient documentation

## 2013-08-23 HISTORY — DX: Cardiac murmur, unspecified: R01.1

## 2013-08-23 LAB — BASIC METABOLIC PANEL
BUN: 15 mg/dL (ref 6–23)
CALCIUM: 9.2 mg/dL (ref 8.4–10.5)
CO2: 27 meq/L (ref 19–32)
CREATININE: 0.84 mg/dL (ref 0.50–1.10)
Chloride: 102 mEq/L (ref 96–112)
GFR calc non Af Amer: >90 mL/min (ref >90)
Glucose, Bld: 85 mg/dL (ref 70–99)
Potassium: 4.2 mEq/L (ref 3.7–5.3)
Sodium: 140 mEq/L (ref 137–147)

## 2013-08-23 LAB — CBC
HCT: 34.3 % — ABNORMAL LOW (ref 36.0–46.0)
Hemoglobin: 11.4 g/dL — ABNORMAL LOW (ref 12.0–15.0)
MCH: 30.1 pg (ref 26.0–34.0)
MCHC: 33.2 g/dL (ref 30.0–36.0)
MCV: 90.5 fL (ref 78.0–100.0)
PLATELETS: 367 10*3/uL (ref 150–400)
RBC: 3.79 MIL/uL — ABNORMAL LOW (ref 3.87–5.11)
RDW: 14.1 % (ref 11.5–15.5)
WBC: 9.5 10*3/uL (ref 4.0–10.5)

## 2013-08-23 LAB — HCG, SERUM, QUALITATIVE: Preg, Serum: NEGATIVE

## 2013-08-27 ENCOUNTER — Ambulatory Visit (HOSPITAL_COMMUNITY): Payer: Medicaid Other | Admitting: Certified Registered Nurse Anesthetist

## 2013-08-27 ENCOUNTER — Encounter (HOSPITAL_COMMUNITY)
Admission: RE | Disposition: A | Discharge status: Home / Self Care | Payer: Self-pay | Source: Ambulatory Visit | Attending: Surgery

## 2013-08-27 ENCOUNTER — Ambulatory Visit (HOSPITAL_COMMUNITY)
Admission: RE | Admit: 2013-08-27 | Discharge: 2013-08-27 | Disposition: A | Discharge status: Home / Self Care | Payer: Medicaid Other | Source: Ambulatory Visit | Attending: Surgery | Admitting: Surgery

## 2013-08-27 ENCOUNTER — Other Ambulatory Visit (INDEPENDENT_AMBULATORY_CARE_PROVIDER_SITE_OTHER): Payer: Self-pay | Admitting: Surgery

## 2013-08-27 ENCOUNTER — Encounter (HOSPITAL_COMMUNITY): Payer: Medicaid Other | Admitting: Certified Registered Nurse Anesthetist

## 2013-08-27 ENCOUNTER — Encounter (HOSPITAL_COMMUNITY): Payer: Self-pay | Admitting: *Deleted

## 2013-08-27 ENCOUNTER — Ambulatory Visit (HOSPITAL_COMMUNITY): Payer: Medicaid Other

## 2013-08-27 DIAGNOSIS — K8010 Calculus of gallbladder with chronic cholecystitis without obstruction: Secondary | ICD-10-CM

## 2013-08-27 DIAGNOSIS — E669 Obesity, unspecified: Secondary | ICD-10-CM | POA: Insufficient documentation

## 2013-08-27 DIAGNOSIS — K819 Cholecystitis, unspecified: Secondary | ICD-10-CM | POA: Diagnosis present

## 2013-08-27 DIAGNOSIS — F17200 Nicotine dependence, unspecified, uncomplicated: Secondary | ICD-10-CM | POA: Insufficient documentation

## 2013-08-27 DIAGNOSIS — D649 Anemia, unspecified: Secondary | ICD-10-CM | POA: Insufficient documentation

## 2013-08-27 DIAGNOSIS — K660 Peritoneal adhesions (postprocedural) (postinfection): Secondary | ICD-10-CM | POA: Insufficient documentation

## 2013-08-27 DIAGNOSIS — I10 Essential (primary) hypertension: Secondary | ICD-10-CM | POA: Insufficient documentation

## 2013-08-27 DIAGNOSIS — K219 Gastro-esophageal reflux disease without esophagitis: Secondary | ICD-10-CM | POA: Insufficient documentation

## 2013-08-27 HISTORY — PX: CHOLECYSTECTOMY: SHX55

## 2013-08-27 IMAGING — RF DG CHOLANGIOGRAM OPERATIVE
1 series · 4 of 4 positions shown · non-contrast
Comparison: none

[Series 1: run · 4 of 72 frames shown]
[frame 11/72]
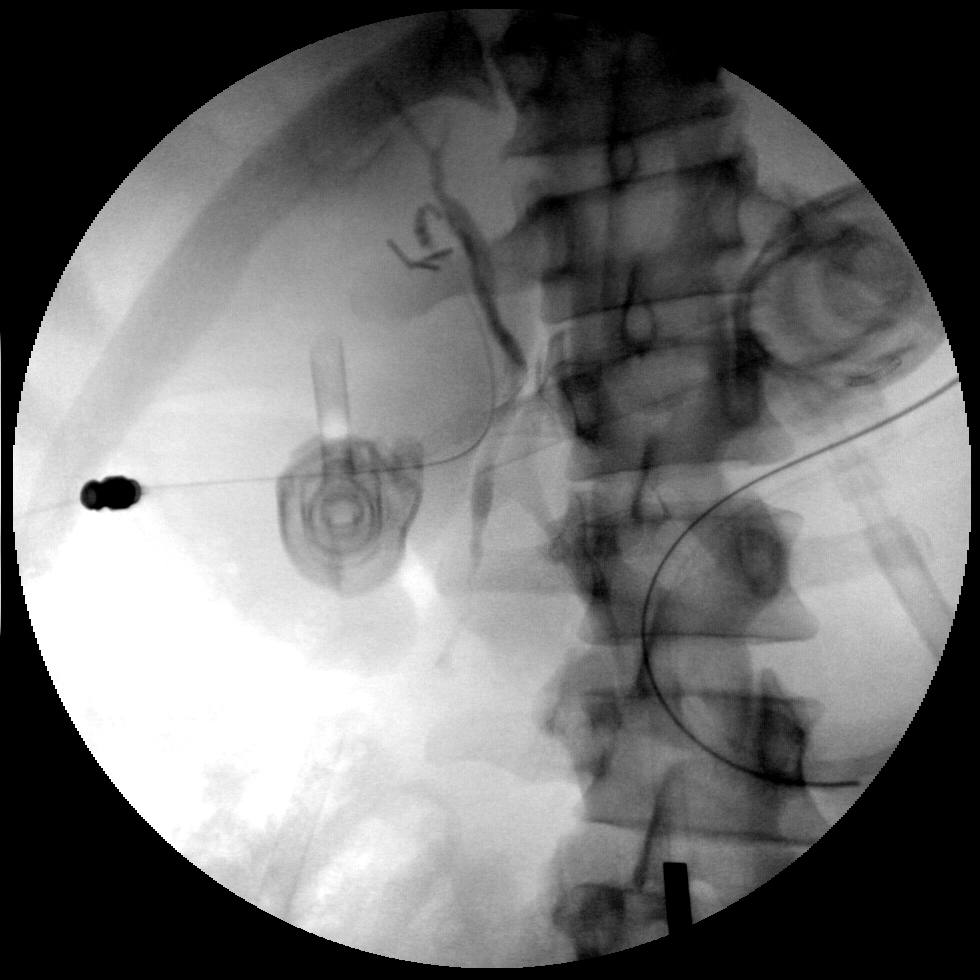
[frame 37/72]
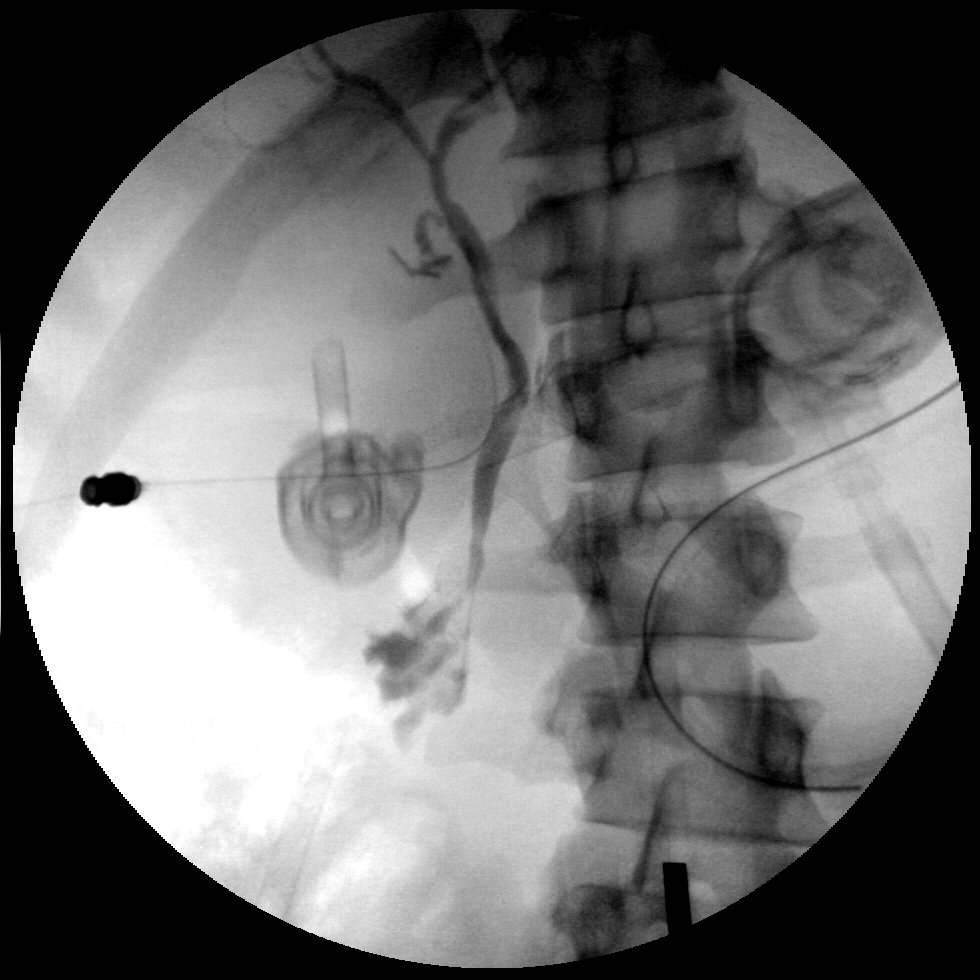
[frame 58/72]
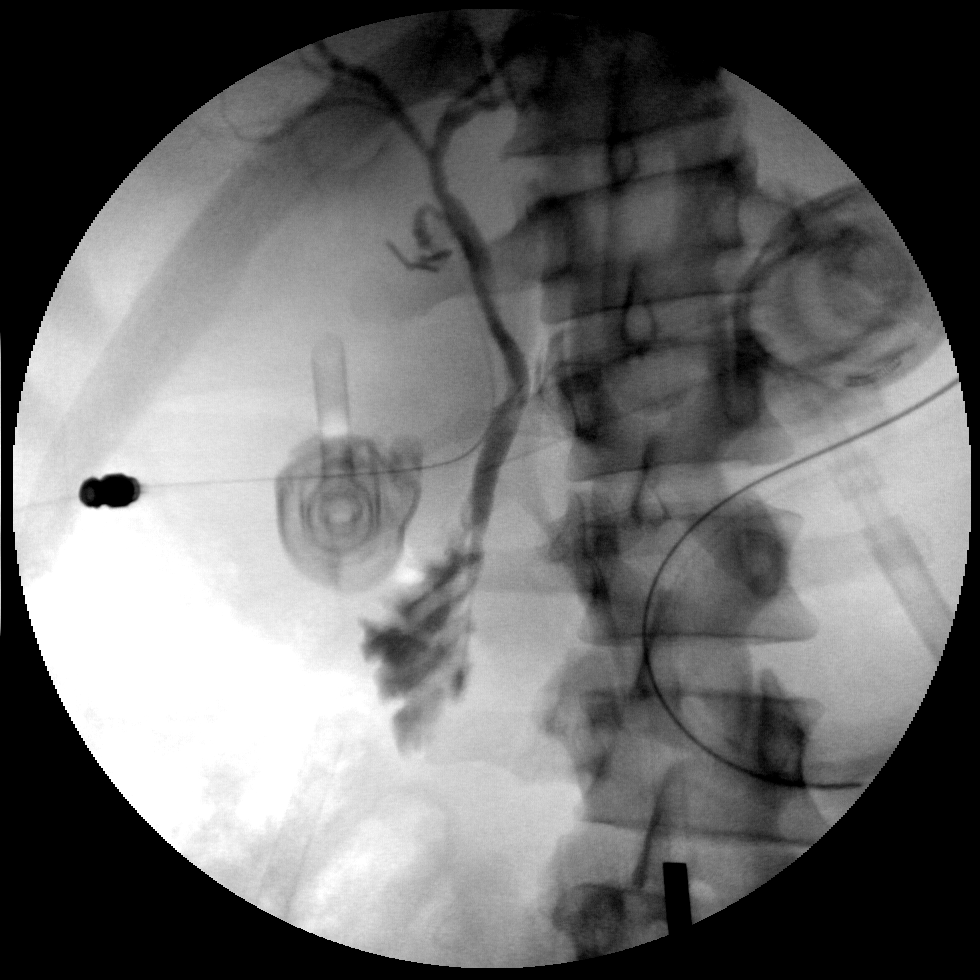
[frame 62/72]
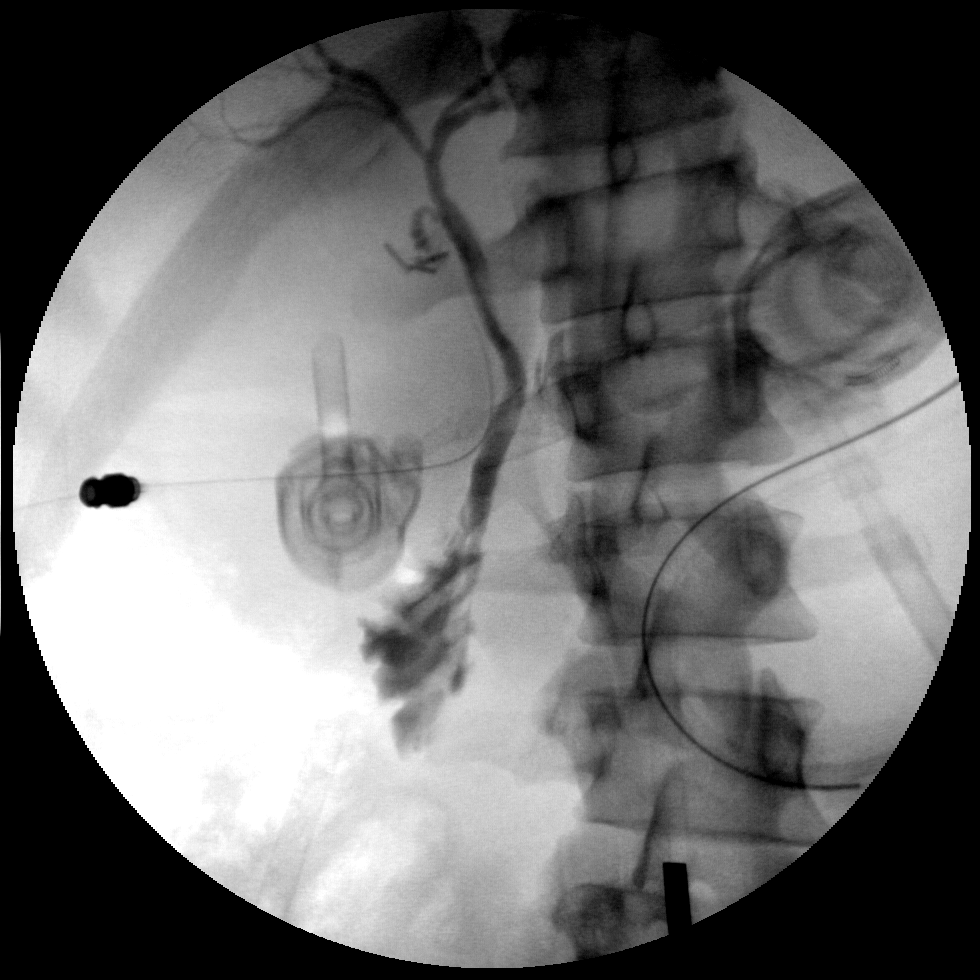

[4 of 4 positions shown; findings below may reference images not displayed]

CLINICAL DATA
Cholecystitis

EXAM
INTRAOPERATIVE CHOLANGIOGRAM

TECHNIQUE
Cholangiographic images from the C-arm fluoroscopic device were
submitted for interpretation post-operatively. Please see the
procedural report for the amount of contrast and the fluoroscopy
time utilized.

COMPARISON
None.

FINDINGS
No persistent filling defects in the common duct. Intrahepatic ducts
are incompletely visualized, appearing decompressed centrally.
Contrast passes into the duodenum.

Negative for retained common duct stone.

SIGNATURE

## 2013-08-27 SURGERY — LAPAROSCOPIC CHOLECYSTECTOMY WITH INTRAOPERATIVE CHOLANGIOGRAM
Anesthesia: General

## 2013-08-27 MED ORDER — FENTANYL CITRATE 0.05 MG/ML IJ SOLN
INTRAMUSCULAR | Status: AC
  Filled 2013-08-27: qty 5

## 2013-08-27 MED ORDER — NEOSTIGMINE METHYLSULFATE 1 MG/ML IJ SOLN
INTRAMUSCULAR | Status: AC
  Filled 2013-08-27: qty 10

## 2013-08-27 MED ORDER — METOPROLOL TARTRATE 1 MG/ML IV SOLN
INTRAVENOUS | Status: DC | PRN
  Administered 2013-08-27 (×2): 1 mg via INTRAVENOUS

## 2013-08-27 MED ORDER — ONDANSETRON HCL 4 MG/2ML IJ SOLN
INTRAMUSCULAR | Status: AC
  Filled 2013-08-27: qty 2

## 2013-08-27 MED ORDER — NEOSTIGMINE METHYLSULFATE 1 MG/ML IJ SOLN
INTRAMUSCULAR | Status: DC | PRN
  Administered 2013-08-27: 3 mg via INTRAVENOUS

## 2013-08-27 MED ORDER — FENTANYL CITRATE 0.05 MG/ML IJ SOLN
INTRAMUSCULAR | Status: AC
  Filled 2013-08-27: qty 2

## 2013-08-27 MED ORDER — HEPARIN SODIUM (PORCINE) 5000 UNIT/ML IJ SOLN
5000.0000 [IU] | Freq: Once | INTRAMUSCULAR | Status: AC
  Administered 2013-08-27: 5000 [IU] via SUBCUTANEOUS
  Filled 2013-08-27: qty 1

## 2013-08-27 MED ORDER — 0.9 % SODIUM CHLORIDE (POUR BTL) OPTIME
TOPICAL | Status: DC | PRN
  Administered 2013-08-27: 1000 mL

## 2013-08-27 MED ORDER — BUPIVACAINE HCL (PF) 0.25 % IJ SOLN
INTRAMUSCULAR | Status: AC
  Filled 2013-08-27: qty 30

## 2013-08-27 MED ORDER — FENTANYL CITRATE 0.05 MG/ML IJ SOLN
25.0000 ug | INTRAMUSCULAR | Status: DC | PRN
Start: 2013-08-27 — End: 2013-08-27
  Administered 2013-08-27 (×2): 50 ug via INTRAVENOUS

## 2013-08-27 MED ORDER — LIDOCAINE HCL (CARDIAC) 20 MG/ML IV SOLN
INTRAVENOUS | Status: AC
  Filled 2013-08-27: qty 5

## 2013-08-27 MED ORDER — ROCURONIUM BROMIDE 100 MG/10ML IV SOLN
INTRAVENOUS | Status: AC
  Filled 2013-08-27: qty 1

## 2013-08-27 MED ORDER — SUCCINYLCHOLINE CHLORIDE 20 MG/ML IJ SOLN
INTRAMUSCULAR | Status: AC
  Filled 2013-08-27: qty 1

## 2013-08-27 MED ORDER — LIDOCAINE HCL (CARDIAC) 20 MG/ML IV SOLN
INTRAVENOUS | Status: DC | PRN
  Administered 2013-08-27: 100 mg via INTRAVENOUS

## 2013-08-27 MED ORDER — GLYCOPYRROLATE 0.2 MG/ML IJ SOLN
INTRAMUSCULAR | Status: AC
  Filled 2013-08-27: qty 3

## 2013-08-27 MED ORDER — PROPOFOL 10 MG/ML IV BOLUS
INTRAVENOUS | Status: AC
  Filled 2013-08-27: qty 20

## 2013-08-27 MED ORDER — FENTANYL CITRATE 0.05 MG/ML IJ SOLN
INTRAMUSCULAR | Status: DC | PRN
  Administered 2013-08-27 (×7): 50 ug via INTRAVENOUS

## 2013-08-27 MED ORDER — BUPIVACAINE LIPOSOME 1.3 % IJ SUSP
INTRAMUSCULAR | Status: DC | PRN
  Administered 2013-08-27: 20 mL

## 2013-08-27 MED ORDER — SUCCINYLCHOLINE CHLORIDE 20 MG/ML IJ SOLN
INTRAMUSCULAR | Status: DC | PRN
  Administered 2013-08-27: 100 mg via INTRAVENOUS

## 2013-08-27 MED ORDER — METOPROLOL TARTRATE 1 MG/ML IV SOLN
INTRAVENOUS | Status: AC
Start: 2013-08-27 — End: 2013-08-27
  Filled 2013-08-27: qty 5

## 2013-08-27 MED ORDER — LACTATED RINGERS IR SOLN
Status: DC | PRN
  Administered 2013-08-27: 1000 mL

## 2013-08-27 MED ORDER — ONDANSETRON HCL 4 MG/2ML IJ SOLN
INTRAMUSCULAR | Status: DC | PRN
  Administered 2013-08-27: 4 mg via INTRAVENOUS

## 2013-08-27 MED ORDER — GLYCOPYRROLATE 0.2 MG/ML IJ SOLN
INTRAMUSCULAR | Status: DC | PRN
  Administered 2013-08-27: 0.4 mg via INTRAVENOUS

## 2013-08-27 MED ORDER — MIDAZOLAM HCL 5 MG/5ML IJ SOLN
INTRAMUSCULAR | Status: DC | PRN
  Administered 2013-08-27: 2 mg via INTRAVENOUS

## 2013-08-27 MED ORDER — LACTATED RINGERS IV SOLN
INTRAVENOUS | Status: DC
  Administered 2013-08-27: 1000 mL via INTRAVENOUS
  Administered 2013-08-27: 13:00:00 via INTRAVENOUS

## 2013-08-27 MED ORDER — DEXAMETHASONE SODIUM PHOSPHATE 10 MG/ML IJ SOLN
INTRAMUSCULAR | Status: DC | PRN
  Administered 2013-08-27: 10 mg via INTRAVENOUS

## 2013-08-27 MED ORDER — PROMETHAZINE HCL 25 MG/ML IJ SOLN
6.2500 mg | INTRAMUSCULAR | Status: DC | PRN
Start: 2013-08-27 — End: 2013-08-27

## 2013-08-27 MED ORDER — ROCURONIUM BROMIDE 100 MG/10ML IV SOLN
INTRAVENOUS | Status: DC | PRN
  Administered 2013-08-27: 5 mg via INTRAVENOUS
  Administered 2013-08-27: 35 mg via INTRAVENOUS

## 2013-08-27 MED ORDER — PROPOFOL 10 MG/ML IV BOLUS
INTRAVENOUS | Status: DC | PRN
  Administered 2013-08-27: 150 mg via INTRAVENOUS

## 2013-08-27 MED ORDER — DEXTROSE 5 % IV SOLN
2.0000 g | INTRAVENOUS | Status: AC
  Administered 2013-08-27: 2 g via INTRAVENOUS
  Filled 2013-08-27: qty 2

## 2013-08-27 MED ORDER — IOHEXOL 300 MG/ML  SOLN
INTRAMUSCULAR | Status: DC | PRN
  Administered 2013-08-27: 50 mL via INTRAVENOUS

## 2013-08-27 MED ORDER — BUPIVACAINE LIPOSOME 1.3 % IJ SUSP
20.0000 mL | Freq: Once | INTRAMUSCULAR | Status: DC
  Filled 2013-08-27: qty 20

## 2013-08-27 MED ORDER — MIDAZOLAM HCL 2 MG/2ML IJ SOLN
INTRAMUSCULAR | Status: AC
  Filled 2013-08-27: qty 2

## 2013-08-27 MED ORDER — DEXTROSE 5 % IV SOLN
INTRAVENOUS | Status: AC
  Filled 2013-08-27 (×2): qty 1

## 2013-08-27 SURGICAL SUPPLY — 44 items
ADH SKN CLS APL DERMABOND .7 (GAUZE/BANDAGES/DRESSINGS) ×1
APL SKNCLS STERI-STRIP NONHPOA (GAUZE/BANDAGES/DRESSINGS)
APPLIER CLIP ROT 10 11.4 M/L (STAPLE) ×3
APR CLP MED LRG 11.4X10 (STAPLE) ×1
BAG SPEC RTRVL 10 TROC 200 (ENDOMECHANICALS) ×1
BAG SPEC RTRVL LRG 6X4 10 (ENDOMECHANICALS)
BENZOIN TINCTURE PRP APPL 2/3 (GAUZE/BANDAGES/DRESSINGS) IMPLANT
CABLE HIGH FREQUENCY MONO STRZ (ELECTRODE) ×2 IMPLANT
CANISTER SUCTION 2500CC (MISCELLANEOUS) ×1 IMPLANT
CATH REDDICK CHOLANGI 4FR 50CM (CATHETERS) ×3 IMPLANT
CLIP APPLIE ROT 10 11.4 M/L (STAPLE) ×1 IMPLANT
CLOSURE WOUND 1/2 X4 (GAUZE/BANDAGES/DRESSINGS)
COVER MAYO STAND STRL (DRAPES) ×3 IMPLANT
DECANTER SPIKE VIAL GLASS SM (MISCELLANEOUS) ×3 IMPLANT
DERMABOND ADVANCED (GAUZE/BANDAGES/DRESSINGS) ×2
DERMABOND ADVANCED .7 DNX12 (GAUZE/BANDAGES/DRESSINGS) IMPLANT
DRAPE C-ARM 42X120 X-RAY (DRAPES) ×3 IMPLANT
DRAPE LAPAROSCOPIC ABDOMINAL (DRAPES) ×3 IMPLANT
ELECT REM PT RETURN 9FT ADLT (ELECTROSURGICAL) ×3
ELECTRODE REM PT RTRN 9FT ADLT (ELECTROSURGICAL) ×1 IMPLANT
GLOVE BIOGEL M 8.0 STRL (GLOVE) ×3 IMPLANT
GOWN STRL REUS W/TWL XL LVL3 (GOWN DISPOSABLE) ×10 IMPLANT
HEMOSTAT SURGICEL 4X8 (HEMOSTASIS) IMPLANT
IV CATH 14GX2 1/4 (CATHETERS) ×3 IMPLANT
KIT BASIN OR (CUSTOM PROCEDURE TRAY) ×3 IMPLANT
MANIFOLD NEPTUNE II (INSTRUMENTS) ×2 IMPLANT
NS IRRIG 1000ML POUR BTL (IV SOLUTION) ×3 IMPLANT
POUCH RETRIEVAL ECOSAC 10 (ENDOMECHANICALS) IMPLANT
POUCH RETRIEVAL ECOSAC 10MM (ENDOMECHANICALS) ×2
POUCH SPECIMEN RETRIEVAL 10MM (ENDOMECHANICALS) ×1 IMPLANT
SCISSORS LAP 5X45 EPIX DISP (ENDOMECHANICALS) ×3 IMPLANT
SET IRRIG TUBING LAPAROSCOPIC (IRRIGATION / IRRIGATOR) ×3 IMPLANT
SLEEVE XCEL OPT CAN 5 100 (ENDOMECHANICALS) ×6 IMPLANT
SOLUTION ANTI FOG 6CC (MISCELLANEOUS) ×3 IMPLANT
STRIP CLOSURE SKIN 1/2X4 (GAUZE/BANDAGES/DRESSINGS) IMPLANT
SUT VIC AB 4-0 SH 18 (SUTURE) ×3 IMPLANT
SYR 20CC LL (SYRINGE) ×3 IMPLANT
TOWEL OR 17X26 10 PK STRL BLUE (TOWEL DISPOSABLE) ×3 IMPLANT
TOWEL OR NON WOVEN STRL DISP B (DISPOSABLE) ×3 IMPLANT
TRAY LAP CHOLE (CUSTOM PROCEDURE TRAY) ×3 IMPLANT
TROCAR BLADELESS OPT 5 100 (ENDOMECHANICALS) ×3 IMPLANT
TROCAR XCEL BLUNT TIP 100MML (ENDOMECHANICALS) IMPLANT
TROCAR XCEL NON-BLD 11X100MML (ENDOMECHANICALS) ×3 IMPLANT
TUBING INSUFFLATION 10FT LAP (TUBING) ×3 IMPLANT

## 2013-08-27 NOTE — Preoperative (Signed)
Beta Blockers   Reason not to administer Beta Blockers:Not Applicable 

## 2013-08-27 NOTE — Interval H&P Note (Signed)
History and Physical Interval Note:  08/27/2013 10:37 AM  Kayla ElseNykesha N Dunn  has presented today for surgery, with the diagnosis of gall stones   The various methods of treatment have been discussed with the patient and family. After consideration of risks, benefits and other options for treatment, the patient has consented to  Procedure(s): LAPAROSCOPIC CHOLECYSTECTOMY WITH INTRAOPERATIVE CHOLANGIOGRAM (N/A) as a surgical intervention .  The patient's history has been reviewed, patient examined, no change in status, stable for surgery.  I have reviewed the patient's chart and labs.  Questions were answered to the patient's satisfaction.     Ocia Simek B

## 2013-08-27 NOTE — Anesthesia Preprocedure Evaluation (Addendum)
Anesthesia Evaluation  Patient identified by MRN, date of birth, ID band Patient awake    Reviewed: Allergy & Precautions, H&P , NPO status , Patient's Chart, lab work & pertinent test results  Airway Mallampati: II TM Distance: >3 FB Neck ROM: Full    Dental no notable dental hx.    Pulmonary neg pulmonary ROS, Current Smoker,  breath sounds clear to auscultation  Pulmonary exam normal       Cardiovascular hypertension, Rhythm:Regular Rate:Normal     Neuro/Psych  Headaches, PSYCHIATRIC DISORDERS Depression    GI/Hepatic Neg liver ROS, GERD-  Medicated,  Endo/Other  negative endocrine ROS  Renal/GU negative Renal ROS  negative genitourinary   Musculoskeletal negative musculoskeletal ROS (+)   Abdominal   Peds negative pediatric ROS (+)  Hematology  (+) anemia ,   Anesthesia Other Findings   Reproductive/Obstetrics negative OB ROS                          Anesthesia Physical Anesthesia Plan  ASA: II  Anesthesia Plan: General   Post-op Pain Management:    Induction: Intravenous  Airway Management Planned: Oral ETT  Additional Equipment:   Intra-op Plan:   Post-operative Plan: Extubation in OR  Informed Consent: I have reviewed the patients History and Physical, chart, labs and discussed the procedure including the risks, benefits and alternatives for the proposed anesthesia with the patient or authorized representative who has indicated his/her understanding and acceptance.   Dental advisory given  Plan Discussed with: CRNA  Anesthesia Plan Comments:         Anesthesia Quick Evaluation

## 2013-08-27 NOTE — Transfer of Care (Signed)
Immediate Anesthesia Transfer of Care Note  Patient: Kayla Dunn  Procedure(s) Performed: Procedure(s) (LRB): LAPAROSCOPIC CHOLECYSTECTOMY WITH INTRAOPERATIVE CHOLANGIOGRAM (N/A)  Patient Location: PACU  Anesthesia Type: General  Level of Consciousness: sedated, patient cooperative and responds to stimulation  Airway & Oxygen Therapy: Patient Spontanous Breathing and Patient connected to face mask oxgen  Post-op Assessment: Report given to PACU RN and Post -op Vital signs reviewed and stable  Post vital signs: Reviewed and stable  Complications: No apparent anesthesia complications

## 2013-08-27 NOTE — Discharge Instructions (Signed)
Laparoscopic Cholecystectomy, Care After °These instructions give you information on caring for yourself after your procedure. Your doctor may also give you more specific instructions. Call your doctor if you have any problems or questions after your procedure.  °HOME CARE °· Change your bandages (dressings) as told by your doctor. °· Keep the wound dry and clean. Wash the wound gently with soap and water. Pat the wound dry with a clean towel. °· Do not take baths, swim, or use hot tubs for 2 weeks, or as told by your doctor. °· Only take medicine as told by your doctor. °· Eat a normal diet as told by your doctor. °· Do not lift anything heavier than 10 pounds (4.5 kg) until your doctor says it is okay. °· Do not play contact sports for 1 week, or as told by your doctor. °GET HELP IF: °· Your wound is red, puffy (swollen), or painful. °· You have yellowish-white fluid (pus) coming from the wound. °· You have fluid draining from the wound for more than 1 day. °· You have a bad smell coming from the wound. °· Your wound breaks open. °GET HELP RIGHT AWAY IF:  °· You have a rash. °· You have trouble breathing. °· You have chest pain. °· You have a fever. °· You have pain in the shoulders (shoulder strap areas) that is getting worse. °· You feel dizzy or pass out (faint). °· You have severe belly (abdominal) pain. °· You feel sick to your stomach (nauseous) or throw up (vomit) for more than 1 day. °Document Released: 03/15/2008 Document Revised: 03/27/2013 Document Reviewed: 01/16/2013 °ExitCare® Patient Information ©2014 [employer]. ° °

## 2013-08-27 NOTE — Op Note (Signed)
Kayla Dunn @date @  Procedure: Laparoscopic Cholecystectomy with intraoperative cholangiogram  Surgeon: Wenda LowMatt Hutch Rhett, MD, FACS Asst:  none  Anes:  General  Drains: None  Findings: Chronic cholecystitis, solitary stone, adhesions to duodenum, normal IOC  Description of Procedure: The patient was taken to OR 1 and given general anesthesia.  The patient was prepped with PCMX and draped sterilely. A time out was performed.  Access to the abdomen was achieved with 5 mm Optiview without difficulty.  Port placement included three 5 mm ports and 1 eleven in the upper midline.    The gallbladder was visualized and the fundus was grasped and the gallbladder was elevated. Traction on the infundibulum allowed for successful demonstration of the critical view. Inflammatory changes were chronic and thin.  The cystic duct was identified and clipped up on the gallbladder and an incision was made in the cystic duct and the Reddick catheter was inserted after milking the cystic duct of any debris. A dynamic cholangiogram was performed which demonstrated good intrahepatic and extrahepatic filling with free flow into the duodenum.    The cystic duct was then triple clipped and divided, the cystic artery was double clipped and divided and then the gallbladder was removed from the gallbladder bed. Removal of the gallbladder from the gallbladder bed was straightforward with hemostasis maintained.  The gallbladder was then placed in a bag and brought out through one of the 11 mm trocar sites. The gallbladder bed was inspected and no bleeding or bile leaks were seen.      Incisions were injected with Exparel and closed with 4-0 Vicryl and Dermabond on the skin.  Sponge and needle count were correct.    The patient was taken to the recovery room in satisfactory condition.

## 2013-08-27 NOTE — H&P (View-Only) (Signed)
Chief Complaint:  History of symptomatic gallstones and brief elevation in lipase  History of Present Illness:  Kayla Dunn is an 33 y.o. female seen in emergency room last fall with severe upper abdominal pain and known gallstones. Lipase was elevated at 90. Since then she has lost about 30 pounds. She has upper abdominal pain and also lower bowel pain when she eats. She likely go ahead and have her gallbladder removed and hopefully her symptoms will improve.  I explained laparoscopic cholecystectomy to her in some detail and gave her a booklet on this. I explained that there is a risk of common bile duct injury which is the most significant risk of the surgery as well as other risks just of having surgery with the laparoscope not limited to bile leaks bowel injuries etc. She likely due to scheduling likely due to as an outpatient. She's up and to return to her prior work.  Past Medical History  Diagnosis Date  . Hypertension   . Insomnia   . Anemia   . Bronchitis   . GERD (gastroesophageal reflux disease)   . Migraines   . Seasonal allergies   . Pancreatitis     Past Surgical History  Procedure Laterality Date  . Cesarean section    . Tubal ligation    . Ovary surgery      Current Outpatient Prescriptions  Medication Sig Dispense Refill  . DiphenhydrAMINE HCl (BENADRYL ALLERGY PO) Take by mouth.      . Pseudoeph-Doxylamine-DM-APAP (NYQUIL MULTI-SYMPTOM PO) Take 30 mLs by mouth every 12 (twelve) hours.      . Ranitidine HCl (ZANTAC PO) Take by mouth.       No current facility-administered medications for this visit.   Aspirin; Hydromorphone hcl; and Morphine Family History  Problem Relation Age of Onset  . Cancer Mother     colon  . Arthritis Mother   . Hypertension Father   . Liver disease Father   . Stroke Father   . Diabetes Paternal Grandmother   . Hypertension Paternal Grandmother   . Heart disease Paternal Grandmother    Social History:   reports that she has  been smoking.  She has never used smokeless tobacco. She reports that she uses illicit drugs (Marijuana). She reports that she does not drink alcohol.   REVIEW OF SYSTEMS - PERTINENT POSITIVES ONLY: No history of DVT.  Physical Exam:   Blood pressure 114/76, pulse 66, temperature 99 F (37.2 C), temperature source Temporal, resp. rate 16, height 5' (1.524 m), weight 129 lb 6.4 oz (58.695 kg). Body mass index is 25.27 kg/(m^2).  Gen:  WDWN African American female NAD  Neurological: Alert and oriented to person, place, and time. Motor and sensory function is grossly intact  Head: Normocephalic and atraumatic.  Eyes: Conjunctivae are normal. Pupils are equal, round, and reactive to light. No scleral icterus.  Neck: Normal range of motion. Neck supple. No tracheal deviation or thyromegaly present.  Cardiovascular:  SR without murmurs or gallops.  No carotid bruits Respiratory: Effort normal.  No respiratory distress. No chest wall tenderness. Breath sounds normal.  No wheezes, rales or rhonchi.  Abdomen:  Flat and with some discomfort in the upper abdomen but no rebound or guarding. GU: Musculoskeletal: Normal range of motion. Extremities are nontender. No cyanosis, edema or clubbing noted Lymphadenopathy: No cervical, preauricular, postauricular or axillary adenopathy is present Skin: Skin is warm and dry. No rash noted. No diaphoresis. No erythema. No pallor. Pscyh: Normal mood and  affect. Behavior is normal. Judgment and thought content normal.   LABORATORY RESULTS: No results found for this or any previous visit (from the past 48 hour(s)).  RADIOLOGY RESULTS: No results found.  Problem List: Patient Active Problem List   Diagnosis Date Noted  . TWIN GESTATION 04/28/2010  . RH FACTOR, NEGATIVE 03/24/2010  . GERD 03/07/2008  . OBESITY, NOS 08/17/2006  . ANEMIA, IRON DEFICIENCY, UNSPEC. 08/17/2006  . DEPRESSION, MAJOR, RECURRENT 08/17/2006  . TOBACCO DEPENDENCE 08/17/2006  .  MIGRAINE, UNSPEC., W/O INTRACTABLE MIGRAINE 08/17/2006  . INSOMNIA NOS 08/17/2006  . PAPANICOLAOU SMEAR, ABNORMAL 08/17/2006  . PAPANICOLAOU SMEAR, ABNORMAL 08/17/2006    Assessment & Plan: Chronic cholecystitis cholelithiasis plan laparoscopic cholecystectomy.    Matt B. Daphine DeutscherMartin, MD, [facility] CampusFACS  [facility] [phone] beeper [phone]  08/14/2013 2:18 PM

## 2013-08-27 NOTE — Anesthesia Postprocedure Evaluation (Signed)
  Anesthesia Post-op Note  Patient: Kayla Dunn  Procedure(s) Performed: Procedure(s) (LRB): LAPAROSCOPIC CHOLECYSTECTOMY WITH INTRAOPERATIVE CHOLANGIOGRAM (N/A)  Patient Location: PACU  Anesthesia Type: General  Level of Consciousness: awake and alert   Airway and Oxygen Therapy: Patient Spontanous Breathing  Post-op Pain: mild  Post-op Assessment: Post-op Vital signs reviewed, Patient's Cardiovascular Status Stable, Respiratory Function Stable, Patent Airway and No signs of Nausea or vomiting  Last Vitals:  Filed Vitals:   08/27/13 1425  BP: 188/88  Pulse: 63  Temp: 36.7 C  Resp: 16    Post-op Vital Signs: stable   Complications: No apparent anesthesia complications

## 2013-08-27 NOTE — Progress Notes (Signed)
Patient comes to short stay without any narcotic prescriptions for home use. She and her mother are aware she is going home without narcotics. Patient states she has so many allergies, she does not want any narcotics for home use. She will use motrin and tylenol at home.

## 2013-08-28 ENCOUNTER — Encounter (HOSPITAL_COMMUNITY): Payer: Self-pay | Admitting: Surgery

## 2014-01-09 ENCOUNTER — Encounter (HOSPITAL_COMMUNITY): Payer: Self-pay | Admitting: Emergency Medicine

## 2014-01-09 ENCOUNTER — Emergency Department (HOSPITAL_COMMUNITY)
Admission: EM | Admit: 2014-01-09 | Discharge: 2014-01-09 | Disposition: A | Discharge status: Home / Self Care | Payer: Medicaid Other | Attending: Emergency Medicine | Admitting: Emergency Medicine

## 2014-01-09 DIAGNOSIS — F17200 Nicotine dependence, unspecified, uncomplicated: Secondary | ICD-10-CM | POA: Insufficient documentation

## 2014-01-09 DIAGNOSIS — I10 Essential (primary) hypertension: Secondary | ICD-10-CM | POA: Diagnosis not present

## 2014-01-09 DIAGNOSIS — R5381 Other malaise: Secondary | ICD-10-CM | POA: Diagnosis not present

## 2014-01-09 DIAGNOSIS — R011 Cardiac murmur, unspecified: Secondary | ICD-10-CM | POA: Diagnosis not present

## 2014-01-09 DIAGNOSIS — R110 Nausea: Secondary | ICD-10-CM

## 2014-01-09 DIAGNOSIS — K219 Gastro-esophageal reflux disease without esophagitis: Secondary | ICD-10-CM | POA: Insufficient documentation

## 2014-01-09 DIAGNOSIS — R112 Nausea with vomiting, unspecified: Secondary | ICD-10-CM | POA: Insufficient documentation

## 2014-01-09 DIAGNOSIS — R5383 Other fatigue: Secondary | ICD-10-CM

## 2014-01-09 DIAGNOSIS — Z3202 Encounter for pregnancy test, result negative: Secondary | ICD-10-CM | POA: Diagnosis not present

## 2014-01-09 DIAGNOSIS — Z79899 Other long term (current) drug therapy: Secondary | ICD-10-CM | POA: Diagnosis not present

## 2014-01-09 DIAGNOSIS — R197 Diarrhea, unspecified: Secondary | ICD-10-CM | POA: Diagnosis not present

## 2014-01-09 DIAGNOSIS — R109 Unspecified abdominal pain: Secondary | ICD-10-CM | POA: Insufficient documentation

## 2014-01-09 DIAGNOSIS — Z862 Personal history of diseases of the blood and blood-forming organs and certain disorders involving the immune mechanism: Secondary | ICD-10-CM | POA: Insufficient documentation

## 2014-01-09 DIAGNOSIS — R509 Fever, unspecified: Secondary | ICD-10-CM | POA: Diagnosis not present

## 2014-01-09 LAB — CBC WITH DIFFERENTIAL/PLATELET
BASOS PCT: 1 % (ref 0–1)
Basophils Absolute: 0 10*3/uL (ref 0.0–0.1)
Eosinophils Absolute: 0.1 10*3/uL (ref 0.0–0.7)
Eosinophils Relative: 1 % (ref 0–5)
HCT: 34.6 % — ABNORMAL LOW (ref 36.0–46.0)
Hemoglobin: 11.4 g/dL — ABNORMAL LOW (ref 12.0–15.0)
LYMPHS PCT: 28 % (ref 12–46)
Lymphs Abs: 1.8 10*3/uL (ref 0.7–4.0)
MCH: 29.8 pg (ref 26.0–34.0)
MCHC: 32.9 g/dL (ref 30.0–36.0)
MCV: 90.6 fL (ref 78.0–100.0)
Monocytes Absolute: 0.5 10*3/uL (ref 0.1–1.0)
Monocytes Relative: 8 % (ref 3–12)
NEUTROS ABS: 4.1 10*3/uL (ref 1.7–7.7)
Neutrophils Relative %: 62 % (ref 43–77)
PLATELETS: 354 10*3/uL (ref 150–400)
RBC: 3.82 MIL/uL — ABNORMAL LOW (ref 3.87–5.11)
RDW: 14.8 % (ref 11.5–15.5)
WBC: 6.6 10*3/uL (ref 4.0–10.5)

## 2014-01-09 LAB — COMPREHENSIVE METABOLIC PANEL
ALT: 19 U/L (ref 0–35)
AST: 25 U/L (ref 0–37)
Albumin: 4 g/dL (ref 3.5–5.2)
Alkaline Phosphatase: 63 U/L (ref 39–117)
Anion gap: 10 (ref 5–15)
BILIRUBIN TOTAL: 0.2 mg/dL — AB (ref 0.3–1.2)
BUN: 15 mg/dL (ref 6–23)
CO2: 28 meq/L (ref 19–32)
CREATININE: 0.75 mg/dL (ref 0.50–1.10)
Calcium: 9.5 mg/dL (ref 8.4–10.5)
Chloride: 104 mEq/L (ref 96–112)
GFR calc Af Amer: >90 mL/min (ref >90)
GLUCOSE: 84 mg/dL (ref 70–99)
Potassium: 4 mEq/L (ref 3.7–5.3)
Sodium: 142 mEq/L (ref 137–147)
Total Protein: 7.4 g/dL (ref 6.0–8.3)

## 2014-01-09 LAB — URINALYSIS, ROUTINE W REFLEX MICROSCOPIC
Bilirubin Urine: NEGATIVE
Glucose, UA: NEGATIVE mg/dL
Hgb urine dipstick: NEGATIVE
KETONES UR: NEGATIVE mg/dL
Leukocytes, UA: NEGATIVE
Nitrite: NEGATIVE
Protein, ur: NEGATIVE mg/dL
Specific Gravity, Urine: 1.02 (ref 1.005–1.030)
UROBILINOGEN UA: 1 mg/dL (ref 0.0–1.0)
pH: 7 (ref 5.0–8.0)

## 2014-01-09 LAB — PREGNANCY, URINE: Preg Test, Ur: NEGATIVE

## 2014-01-09 LAB — LIPASE, BLOOD: LIPASE: 36 U/L (ref 11–59)

## 2014-01-09 MED ORDER — ONDANSETRON HCL 4 MG/2ML IJ SOLN
4.0000 mg | Freq: Once | INTRAMUSCULAR | Status: DC
Start: 2014-01-09 — End: 2014-01-09

## 2014-01-09 MED ORDER — ALUM & MAG HYDROXIDE-SIMETH 200-200-20 MG/5ML PO SUSP: 15.0000 mL | Freq: Four times a day (QID) | ORAL | Status: DC | PRN

## 2014-01-09 MED ORDER — ONDANSETRON 8 MG PO TBDP
8.0000 mg | ORAL_TABLET | Freq: Once | ORAL | Status: AC
  Administered 2014-01-09: 8 mg via ORAL
  Filled 2014-01-09: qty 1

## 2014-01-09 MED ORDER — ONDANSETRON HCL 4 MG PO TABS: 4.0000 mg | ORAL_TABLET | Freq: Three times a day (TID) | ORAL | Status: DC | PRN

## 2014-01-09 MED ORDER — SODIUM CHLORIDE 0.9 % IV BOLUS (SEPSIS): 1000.0000 mL | Freq: Once | INTRAVENOUS | Status: DC

## 2014-01-09 MED ORDER — ALUM & MAG HYDROXIDE-SIMETH 200-200-20 MG/5ML PO SUSP
15.0000 mL | Freq: Once | ORAL | Status: AC
  Administered 2014-01-09: 15 mL via ORAL
  Filled 2014-01-09: qty 30

## 2014-01-09 NOTE — ED Notes (Signed)
Pt alert and oriented x4. Respirations even and unlabored, bilateral symmetrical rise and fall of chest. Skin warm and dry. In no acute distress. Denies needs.   

## 2014-01-09 NOTE — ED Provider Notes (Signed)
Medical screening examination/treatment/procedure(s) were performed by non-physician practitioner and as supervising physician I was immediately available for consultation/collaboration.   EKG Interpretation None      Devoria AlbeIva Mayuri Staples, MD, Armando GangFACEP   Ward GivensIva L Harleen Fineberg, MD 01/09/14 [phone]

## 2014-01-09 NOTE — ED Provider Notes (Signed)
CSN: [id number]     Arrival date & time 01/09/14  1153 History   First MD Initiated Contact with Patient 01/09/14 1321     Chief Complaint  Patient presents with  . Nausea  . Abdominal Pain     (Consider location/radiation/quality/duration/timing/severity/associated sxs/prior Treatment) HPI Comments: Patient is a 33 year old female past medical history significant for GERD, insomnia, migraines, pancreatitis, hypertension presenting to the emergency department for 1 week of fatigue with decreased appetite. Patient states until Sunday she has nausea, a few episodes of nonbloody nonbilious emesis, nonbloody diarrhea and abdominal discomfort. Alleviating factors: none. Aggravating factors: none. Medications tried prior to arrival: none. Patient recently just switched shifts and states she has been under a great deal of stress. Abdominal surgical history includes C-section, Cholecystectomy.     Patient is a 33 y.o. female presenting with abdominal pain. The history is provided by the patient.  Abdominal Pain Associated symptoms: diarrhea (resolved), fatigue, fever (subjective resolved), nausea and vomiting (resolved)     Past Medical History  Diagnosis Date  . Insomnia   . Anemia   . Bronchitis   . GERD (gastroesophageal reflux disease)   . Migraines   . Seasonal allergies   . Pancreatitis   . Heart murmur   . Hypertension     "havent been taking my medicine in awhile"   Past Surgical History  Procedure Laterality Date  . Cesarean section    . Tubal ligation    . Ovary surgery    . Cholecystectomy N/A 08/27/2013    Procedure: LAPAROSCOPIC CHOLECYSTECTOMY WITH INTRAOPERATIVE CHOLANGIOGRAM;  Surgeon: Valarie Merino, MD;  Location: [facility];  Service: General;  Laterality: N/A;   Family History  Problem Relation Age of Onset  . Cancer Mother     colon  . Arthritis Mother   . Hypertension Father   . Liver disease Father   . Stroke Father   . Diabetes Paternal Grandmother   .  Hypertension Paternal Grandmother   . Heart disease Paternal Grandmother    History  Substance Use Topics  . Smoking status: Light Tobacco Smoker -- 0.25 packs/day for 12 years  . Smokeless tobacco: Never Used  . Alcohol Use: Yes     Comment: socially   OB History   Grav Para Term Preterm Abortions TAB SAB Ect Mult Living   9 4 1 3 5  5   4      Review of Systems  Constitutional: Positive for fever (subjective resolved) and fatigue.  Gastrointestinal: Positive for nausea, vomiting (resolved), abdominal pain and diarrhea (resolved). Negative for blood in stool, abdominal distention and anal bleeding.  All other systems reviewed and are negative.     Allergies  Aspirin; Hydromorphone hcl; and Morphine  Home Medications   Prior to Admission medications   Medication Sig Start Date End Date Taking? Authorizing Provider  acetaminophen (TYLENOL ARTHRITIS PAIN) 650 MG CR tablet Take 1,300 mg by mouth every 8 (eight) hours as needed for pain.   Yes Historical Provider, MD  ranitidine (ZANTAC) 75 MG tablet Take 75 mg by mouth 2 (two) times daily.   Yes Historical Provider, MD  simethicone (MYLICON) 80 MG chewable tablet Chew 80 mg by mouth daily as needed for flatulence.    Yes Historical Provider, MD  alum & mag hydroxide-simeth (MAALOX/MYLANTA) 200-200-20 MG/5ML suspension Take 15 mLs by mouth every 6 (six) hours as needed for indigestion or heartburn. 01/09/14   Merek Niu L Prerana Strayer, PA-C  ondansetron (ZOFRAN) 4 MG tablet Take 1  tablet (4 mg total) by mouth every 8 (eight) hours as needed for nausea or vomiting. 01/09/14   Victorino DikeJennifer L Marrian Bells, PA-C   BP 125/69  Pulse 83  Temp(Src) 98.3 F (36.8 C) (Oral)  Resp 16  Ht 5' (1.524 m)  Wt 122 lb (55.339 kg)  BMI 23.83 kg/m2  SpO2 100%  LMP 01/01/2014 Physical Exam  Nursing note and vitals reviewed. Constitutional: She is oriented to person, place, and time. She appears well-developed and well-nourished. No distress.  HENT:   Head: Normocephalic and atraumatic.  Right Ear: External ear normal.  Left Ear: External ear normal.  Nose: Nose normal.  Mouth/Throat: Oropharynx is clear and moist. No oropharyngeal exudate.  Eyes: Conjunctivae are normal.  Neck: Normal range of motion. Neck supple.  Cardiovascular: Normal rate, regular rhythm and normal heart sounds.   Pulmonary/Chest: Effort normal and breath sounds normal. No respiratory distress.  Abdominal: Soft. Bowel sounds are normal. She exhibits no distension. There is no tenderness. There is no rigidity, no rebound and no guarding.  Musculoskeletal: Normal range of motion.  Neurological: She is alert and oriented to person, place, and time.  Skin: Skin is warm and dry. She is not diaphoretic.  Psychiatric: She has a normal mood and affect.    ED Course  Procedures (including critical care time) Medications  ondansetron (ZOFRAN-ODT) disintegrating tablet 8 mg (8 mg Oral Given 01/09/14 1440)  alum & mag hydroxide-simeth (MAALOX/MYLANTA) 200-200-20 MG/5ML suspension 15 mL (15 mLs Oral Given 01/09/14 1538)    Labs Review Labs Reviewed  COMPREHENSIVE METABOLIC PANEL - Abnormal; Notable for the following:    Total Bilirubin 0.2 (*)    All other components within normal limits  CBC WITH DIFFERENTIAL - Abnormal; Notable for the following:    RBC 3.82 (*)    Hemoglobin 11.4 (*)    HCT 34.6 (*)    All other components within normal limits  LIPASE, BLOOD  PREGNANCY, URINE  URINALYSIS, ROUTINE W REFLEX MICROSCOPIC    Imaging Review No results found.   EKG Interpretation None      MDM   Final diagnoses:  Nausea    Filed Vitals:   01/09/14 1211  BP: 125/69  Pulse: 83  Temp: 98.3 F (36.8 C)  Resp: 16   Afebrile, NAD, non-toxic appearing, AAOx4.  Patient is nontoxic, nonseptic appearing, in no apparent distress.  Patient's pain and other symptoms adequately managed in emergency department.  Fluids tolerated in ED.  Labs and vitals reviewed.   Patient does not meet the SIRS or Sepsis criteria.  On repeat exam patient does not have a surgical abdomin and there are nor peritoneal signs.  No indication of appendicitis, bowel obstruction, bowel perforation, cholecystitis, diverticulitis, or ectopic pregnancy.  Patient discharged home with symptomatic treatment and given strict instructions for follow-up with their primary care physician.  I have also discussed reasons to return immediately to the ER.  Patient expresses understanding and agrees with plan.        Jeannetta EllisJennifer L Niambi Smoak, PA-C 01/09/14 [phone]

## 2014-01-09 NOTE — Discharge Instructions (Signed)
Please follow up with your primary care physician in 1-2 days. If you do not have one please call the [facility] number listed above. Please take medications as prescribed. Please read all discharge instructions and return precautions.  ° ° °Nausea, Adult °Nausea is the feeling that you have an upset stomach or have to vomit. Nausea by itself is not likely a serious concern, but it may be an early sign of more serious medical problems. As nausea gets worse, it can lead to vomiting. If vomiting develops, there is the risk of dehydration.  °CAUSES  °· Viral infections. °· Food poisoning. °· Medicines. °· Pregnancy. °· Motion sickness. °· Migraine headaches. °· Emotional distress. °· Severe pain from any source. °· Alcohol intoxication. °HOME CARE INSTRUCTIONS °· Get plenty of rest. °· Ask your caregiver about specific rehydration instructions. °· Eat small amounts of food and sip liquids more often. °· Take all medicines as told by your caregiver. °SEEK MEDICAL CARE IF: °· You have not improved after 2 days, or you get worse. °· You have a headache. °SEEK IMMEDIATE MEDICAL CARE IF:  °· You have a fever. °· You faint. °· You keep vomiting or have blood in your vomit. °· You are extremely weak or dehydrated. °· You have dark or bloody stools. °· You have severe chest or abdominal pain. °MAKE SURE YOU: °· Understand these instructions. °· Will watch your condition. °· Will get help right away if you are not doing well or get worse. °Document Released: 07/14/2004 Document Revised: 02/29/2012 Document Reviewed: 02/16/2011 °ExitCare® Patient Information ©2015 [employer]. This information is not intended to replace advice given to you by your health care provider. Make sure you discuss any questions you have with your health care provider. ° ° °

## 2014-01-09 NOTE — ED Notes (Signed)
Pt escorted to discharge window. Pt verbalized understanding discharge instructions. In no acute distress.  

## 2014-01-09 NOTE — ED Notes (Signed)
Pt given PO fluids to drink per PA

## 2014-01-09 NOTE — ED Notes (Signed)
Pt states not been feeling well for a week.  Decrease appetite, fever, chills and abdominal pain. Nausea.

## 2014-01-29 ENCOUNTER — Encounter (HOSPITAL_COMMUNITY): Payer: Self-pay | Admitting: Emergency Medicine

## 2014-01-29 ENCOUNTER — Emergency Department (HOSPITAL_COMMUNITY)
Admission: EM | Admit: 2014-01-29 | Discharge: 2014-01-30 | Disposition: A | Discharge status: Home / Self Care | Payer: Medicaid Other | Attending: Emergency Medicine | Admitting: Emergency Medicine

## 2014-01-29 DIAGNOSIS — Z862 Personal history of diseases of the blood and blood-forming organs and certain disorders involving the immune mechanism: Secondary | ICD-10-CM | POA: Insufficient documentation

## 2014-01-29 DIAGNOSIS — B373 Candidiasis of vulva and vagina: Secondary | ICD-10-CM | POA: Insufficient documentation

## 2014-01-29 DIAGNOSIS — B3731 Acute candidiasis of vulva and vagina: Secondary | ICD-10-CM

## 2014-01-29 DIAGNOSIS — R112 Nausea with vomiting, unspecified: Secondary | ICD-10-CM | POA: Diagnosis not present

## 2014-01-29 DIAGNOSIS — I10 Essential (primary) hypertension: Secondary | ICD-10-CM | POA: Diagnosis not present

## 2014-01-29 DIAGNOSIS — N898 Other specified noninflammatory disorders of vagina: Secondary | ICD-10-CM | POA: Diagnosis present

## 2014-01-29 DIAGNOSIS — Z3202 Encounter for pregnancy test, result negative: Secondary | ICD-10-CM | POA: Diagnosis not present

## 2014-01-29 DIAGNOSIS — Z8709 Personal history of other diseases of the respiratory system: Secondary | ICD-10-CM | POA: Diagnosis not present

## 2014-01-29 DIAGNOSIS — Z113 Encounter for screening for infections with a predominantly sexual mode of transmission: Secondary | ICD-10-CM | POA: Insufficient documentation

## 2014-01-29 DIAGNOSIS — R011 Cardiac murmur, unspecified: Secondary | ICD-10-CM | POA: Insufficient documentation

## 2014-01-29 DIAGNOSIS — Z711 Person with feared health complaint in whom no diagnosis is made: Secondary | ICD-10-CM

## 2014-01-29 DIAGNOSIS — Z79899 Other long term (current) drug therapy: Secondary | ICD-10-CM | POA: Insufficient documentation

## 2014-01-29 DIAGNOSIS — K219 Gastro-esophageal reflux disease without esophagitis: Secondary | ICD-10-CM | POA: Insufficient documentation

## 2014-01-29 DIAGNOSIS — F17200 Nicotine dependence, unspecified, uncomplicated: Secondary | ICD-10-CM | POA: Diagnosis not present

## 2014-01-29 DIAGNOSIS — R110 Nausea: Secondary | ICD-10-CM

## 2014-01-29 LAB — CBC WITH DIFFERENTIAL/PLATELET
BASOS ABS: 0.1 10*3/uL (ref 0.0–0.1)
BASOS PCT: 1 % (ref 0–1)
EOS ABS: 0.2 10*3/uL (ref 0.0–0.7)
Eosinophils Relative: 3 % (ref 0–5)
HCT: 31.5 % — ABNORMAL LOW (ref 36.0–46.0)
Hemoglobin: 10.1 g/dL — ABNORMAL LOW (ref 12.0–15.0)
LYMPHS PCT: 45 % (ref 12–46)
Lymphs Abs: 3.1 10*3/uL (ref 0.7–4.0)
MCH: 28.9 pg (ref 26.0–34.0)
MCHC: 32.1 g/dL (ref 30.0–36.0)
MCV: 90.3 fL (ref 78.0–100.0)
MONO ABS: 0.7 10*3/uL (ref 0.1–1.0)
Monocytes Relative: 10 % (ref 3–12)
Neutro Abs: 2.8 10*3/uL (ref 1.7–7.7)
Neutrophils Relative %: 41 % — ABNORMAL LOW (ref 43–77)
PLATELETS: 317 10*3/uL (ref 150–400)
RBC: 3.49 MIL/uL — ABNORMAL LOW (ref 3.87–5.11)
RDW: 14.8 % (ref 11.5–15.5)
WBC: 6.9 10*3/uL (ref 4.0–10.5)

## 2014-01-29 LAB — COMPREHENSIVE METABOLIC PANEL
ALBUMIN: 3.6 g/dL (ref 3.5–5.2)
ALT: 14 U/L (ref 0–35)
AST: 17 U/L (ref 0–37)
Alkaline Phosphatase: 70 U/L (ref 39–117)
Anion gap: 9 (ref 5–15)
BUN: 14 mg/dL (ref 6–23)
CALCIUM: 8.9 mg/dL (ref 8.4–10.5)
CO2: 27 mEq/L (ref 19–32)
CREATININE: 0.68 mg/dL (ref 0.50–1.10)
Chloride: 102 mEq/L (ref 96–112)
GFR calc Af Amer: >90 mL/min (ref >90)
Glucose, Bld: 96 mg/dL (ref 70–99)
Potassium: 4.1 mEq/L (ref 3.7–5.3)
SODIUM: 138 meq/L (ref 137–147)
Total Bilirubin: <0.2 mg/dL — ABNORMAL LOW (ref 0.3–1.2)
Total Protein: 6.9 g/dL (ref 6.0–8.3)

## 2014-01-29 LAB — LIPASE, BLOOD: LIPASE: 37 U/L (ref 11–59)

## 2014-01-29 LAB — WET PREP, GENITAL: TRICH WET PREP: NONE SEEN

## 2014-01-29 LAB — URINALYSIS, ROUTINE W REFLEX MICROSCOPIC
BILIRUBIN URINE: NEGATIVE
Glucose, UA: NEGATIVE mg/dL
Hgb urine dipstick: NEGATIVE
Ketones, ur: NEGATIVE mg/dL
LEUKOCYTES UA: NEGATIVE
Nitrite: NEGATIVE
Protein, ur: NEGATIVE mg/dL
Specific Gravity, Urine: 1.03 (ref 1.005–1.030)
UROBILINOGEN UA: 1 mg/dL (ref 0.0–1.0)
pH: 6.5 (ref 5.0–8.0)

## 2014-01-29 LAB — POC URINE PREG, ED: PREG TEST UR: NEGATIVE

## 2014-01-29 NOTE — ED Provider Notes (Signed)
CSN: [id number]     Arrival date & time 01/29/14  2023 History   First MD Initiated Contact with Patient 01/29/14 2141     Chief Complaint  Patient presents with  . Vaginal Bleeding  . Emesis     (Consider location/radiation/quality/duration/timing/severity/associated sxs/prior Treatment) HPI  A 33 yo female presents to the ED with complaints of nausea x 3 weeks she also complains of 6 episodes of emesis today.  The patient was seen at the ED on 7/23 for nausea.  She was sent home with Zofran which has helped control her nausea until today when she vomited 6 times.  Denies any blood or bilious vomit.  Denies diarrhea.  The patient felt lightheaded after vomiting.  The patient states she has noticed some breast tenderness in the past 3 days.  She also has had progressively noticeable breast discharge.   The patient also complains of some dyspareunia as well as vaginal bleeding after intercourse.  She states the bleeding is copious as if she were beginning her menses.  Denies the presence of vaginal discharge.  Denies rough sexual practices.  States the pain felt during intercourse is internal and felt in her lower abdomen/pelvic area.  Denies fever, sweats, or chills.       Past Medical History  Diagnosis Date  . Insomnia   . Anemia   . Bronchitis   . GERD (gastroesophageal reflux disease)   . Migraines   . Seasonal allergies   . Pancreatitis   . Heart murmur   . Hypertension     "havent been taking my medicine in awhile"   Past Surgical History  Procedure Laterality Date  . Cesarean section    . Tubal ligation    . Ovary surgery    . Cholecystectomy N/A 08/27/2013    Procedure: LAPAROSCOPIC CHOLECYSTECTOMY WITH INTRAOPERATIVE CHOLANGIOGRAM;  Surgeon: Valarie Merino, MD;  Location: [facility];  Service: General;  Laterality: N/A;   Family History  Problem Relation Age of Onset  . Cancer Mother     colon  . Arthritis Mother   . Hypertension Father   . Liver disease Father   .  Stroke Father   . Diabetes Paternal Grandmother   . Hypertension Paternal Grandmother   . Heart disease Paternal Grandmother    History  Substance Use Topics  . Smoking status: Light Tobacco Smoker -- 0.25 packs/day for 12 years  . Smokeless tobacco: Never Used  . Alcohol Use: Yes     Comment: socially   OB History   Grav Para Term Preterm Abortions TAB SAB Ect Mult Living   9 4 1 3 5  5   4      Review of Systems    Allergies  Aspirin; Hydromorphone hcl; and Morphine  Home Medications   Prior to Admission medications   Medication Sig Start Date End Date Taking? Authorizing Provider  acetaminophen (TYLENOL ARTHRITIS PAIN) 650 MG CR tablet Take 1,300 mg by mouth every 8 (eight) hours as needed for pain.   Yes Historical Provider, MD  alum & mag hydroxide-simeth (MAALOX/MYLANTA) 200-200-20 MG/5ML suspension Take 15 mLs by mouth every 6 (six) hours as needed for indigestion or heartburn. 01/09/14  Yes Jennifer L Piepenbrink, PA-C  ranitidine (ZANTAC) 75 MG tablet Take 75 mg by mouth 2 (two) times daily.   Yes Historical Provider, MD  simethicone (MYLICON) 80 MG chewable tablet Chew 80 mg by mouth daily as needed for flatulence.    Yes Historical Provider, MD  fluconazole (  DIFLUCAN) 200 MG tablet Take 1 tablet (200 mg total) by mouth daily. 01/30/14 02/06/14  Peggyann Zwiefelhofer Irine SealG Akyah Lagrange, PA-C   BP 142/77  Pulse 78  Temp(Src) 98.6 F (37 C) (Oral)  Resp 18  SpO2 100%  LMP 01/01/2014 Physical Exam  Nursing note and vitals reviewed. Constitutional: She appears well-developed and well-nourished. No distress.  HENT:  Head: Normocephalic and atraumatic.  Eyes: Pupils are equal, round, and reactive to light.  Neck: Normal range of motion. Neck supple.  Cardiovascular: Normal rate and regular rhythm.   Pulmonary/Chest: Effort normal.  Abdominal: Soft. Bowel sounds are normal. There is no tenderness. There is no rigidity, no guarding and no CVA tenderness.  Genitourinary: Uterus normal. Cervix  exhibits no motion tenderness, no discharge and no friability. Right adnexum displays no tenderness and no fullness. Left adnexum displays no tenderness and no fullness. No bleeding around the vagina. No foreign body around the vagina. Vaginal discharge found.  Neurological: She is alert.  Skin: Skin is warm and dry.    ED Course  Procedures (including critical care time) Labs Review Labs Reviewed  WET PREP, GENITAL - Abnormal; Notable for the following:    Yeast Wet Prep HPF POC MODERATE (*)    Clue Cells Wet Prep HPF POC FEW (*)    WBC, Wet Prep HPF POC MODERATE (*)    All other components within normal limits  CBC WITH DIFFERENTIAL - Abnormal; Notable for the following:    RBC 3.49 (*)    Hemoglobin 10.1 (*)    HCT 31.5 (*)    Neutrophils Relative % 41 (*)    All other components within normal limits  COMPREHENSIVE METABOLIC PANEL - Abnormal; Notable for the following:    Total Bilirubin <0.2 (*)    All other components within normal limits  URINALYSIS, ROUTINE W REFLEX MICROSCOPIC - Abnormal; Notable for the following:    Color, Urine AMBER (*)    All other components within normal limits  GC/CHLAMYDIA PROBE AMP  LIPASE, BLOOD  HIV ANTIBODY (ROUTINE TESTING)  RPR  POC URINE PREG, ED    Imaging Review No results found.   EKG Interpretation None      MDM   Final diagnoses:  Nausea  Vaginal yeast infection  Concern about STD in female without diagnosis    Patients blood work came back looking stable. She has moderate yeast in her wet prep as well as Moderate WBC and few clue cells.   Medications  cefTRIAXone (ROCEPHIN) injection 250 mg (not administered)  azithromycin (ZITHROMAX) tablet 1,000 mg (not administered)  metroNIDAZOLE (FLAGYL) tablet 2,000 mg (not administered)  ondansetron (ZOFRAN-ODT) disintegrating tablet 4 mg (not administered)  fluconazole (DIFLUCAN) tablet 100 mg (not administered)    Rx:  fluconazole (DIFLUCAN) 200 MG tablet Take 1 tablet  (200 mg total) by mouth daily. 7 tablet Dorthula Matasiffany G Zell Doucette, PA-C  33 y.o.Sheppard EvensNykesha N Siciliano's evaluation in the Emergency Department is complete. It has been determined that no acute conditions requiring further emergency intervention are present at this time. The patient/guardian have been advised of the diagnosis and plan. We have discussed signs and symptoms that warrant return to the ED, such as changes or worsening in symptoms.  Vital signs are stable at discharge. Filed Vitals:   01/29/14 2059  BP: 142/77  Pulse: 78  Temp: 98.6 F (37 C)  Resp: 18    Patient/guardian has voiced understanding and agreed to follow-up with the PCP or specialist.   Dorthula Matasiffany G Blakeleigh Domek, PA-C 01/30/14  0027 

## 2014-01-29 NOTE — ED Notes (Signed)
Pt A+Ox4, reports episodes vaginal bleeding and lower abd cramping during intercourse last night.  Pt reports similar episode x3 weeks ago.  Pt denies active bleeding at this time.  Pt reports nausea/vomiting today and "off and on for about a year".  Pt reports problems with gallstones and pancreatitis in the past.  No active vomiting.  Pt denies abd pain at this time.  Skin PWD.  Speaking full/clear sentences, rr even/un-lab.  Abd s/nt/nd.  Pt reports has tubal ligation.  NAD.

## 2014-01-30 ENCOUNTER — Encounter (HOSPITAL_COMMUNITY): Payer: Self-pay | Admitting: *Deleted

## 2014-01-30 ENCOUNTER — Inpatient Hospital Stay (HOSPITAL_COMMUNITY)
Admission: AD | Admit: 2014-01-30 | Discharge: 2014-01-30 | Disposition: A | Discharge status: Home / Self Care | Payer: Medicaid Other | Source: Ambulatory Visit | Attending: Obstetrics & Gynecology | Admitting: Obstetrics & Gynecology

## 2014-01-30 DIAGNOSIS — F17200 Nicotine dependence, unspecified, uncomplicated: Secondary | ICD-10-CM | POA: Diagnosis not present

## 2014-01-30 DIAGNOSIS — N643 Galactorrhea not associated with childbirth: Secondary | ICD-10-CM | POA: Insufficient documentation

## 2014-01-30 DIAGNOSIS — R112 Nausea with vomiting, unspecified: Secondary | ICD-10-CM | POA: Insufficient documentation

## 2014-01-30 DIAGNOSIS — F3289 Other specified depressive episodes: Secondary | ICD-10-CM | POA: Diagnosis not present

## 2014-01-30 DIAGNOSIS — I10 Essential (primary) hypertension: Secondary | ICD-10-CM | POA: Diagnosis not present

## 2014-01-30 DIAGNOSIS — F329 Major depressive disorder, single episode, unspecified: Secondary | ICD-10-CM | POA: Insufficient documentation

## 2014-01-30 DIAGNOSIS — K219 Gastro-esophageal reflux disease without esophagitis: Secondary | ICD-10-CM | POA: Insufficient documentation

## 2014-01-30 HISTORY — DX: Major depressive disorder, single episode, unspecified: F32.9

## 2014-01-30 HISTORY — DX: Gestational (pregnancy-induced) hypertension without significant proteinuria, unspecified trimester: O13.9

## 2014-01-30 HISTORY — DX: Unspecified osteoarthritis, unspecified site: M19.90

## 2014-01-30 HISTORY — DX: Depression, unspecified: F32.A

## 2014-01-30 HISTORY — DX: Acute parametritis and pelvic cellulitis: N73.0

## 2014-01-30 LAB — RPR

## 2014-01-30 LAB — GC/CHLAMYDIA PROBE AMP
CT Probe RNA: NEGATIVE
GC PROBE AMP APTIMA: NEGATIVE

## 2014-01-30 LAB — HCG, QUANTITATIVE, PREGNANCY: hCG, Beta Chain, Quant, S: <1 m[IU]/mL (ref <5)

## 2014-01-30 LAB — HIV ANTIBODY (ROUTINE TESTING W REFLEX): HIV: NONREACTIVE

## 2014-01-30 MED ORDER — FLUCONAZOLE 100 MG PO TABS
100.0000 mg | ORAL_TABLET | Freq: Once | ORAL | Status: AC
  Administered 2014-01-30: 100 mg via ORAL
  Filled 2014-01-30: qty 1

## 2014-01-30 MED ORDER — METRONIDAZOLE 500 MG PO TABS
2000.0000 mg | ORAL_TABLET | Freq: Once | ORAL | Status: AC
  Administered 2014-01-30: 2000 mg via ORAL
  Filled 2014-01-30: qty 4

## 2014-01-30 MED ORDER — ONDANSETRON 4 MG PO TBDP
4.0000 mg | ORAL_TABLET | Freq: Once | ORAL | Status: AC
  Administered 2014-01-30: 4 mg via ORAL
  Filled 2014-01-30: qty 1

## 2014-01-30 MED ORDER — FLUCONAZOLE 200 MG PO TABS: 200.0000 mg | ORAL_TABLET | Freq: Every day | ORAL | Status: AC

## 2014-01-30 MED ORDER — ONDANSETRON HCL 4 MG PO TABS: 4.0000 mg | ORAL_TABLET | Freq: Three times a day (TID) | ORAL | Status: AC | PRN

## 2014-01-30 MED ORDER — CEFTRIAXONE SODIUM 250 MG IJ SOLR
250.0000 mg | Freq: Once | INTRAMUSCULAR | Status: AC
  Administered 2014-01-30: 250 mg via INTRAMUSCULAR
  Filled 2014-01-30: qty 250

## 2014-01-30 MED ORDER — STERILE WATER FOR INJECTION IJ SOLN
INTRAMUSCULAR | Status: AC
  Administered 2014-01-30: 10 mL
  Filled 2014-01-30: qty 10

## 2014-01-30 MED ORDER — AZITHROMYCIN 250 MG PO TABS
1000.0000 mg | ORAL_TABLET | Freq: Once | ORAL | Status: AC
  Administered 2014-01-30: 1000 mg via ORAL
  Filled 2014-01-30: qty 4

## 2014-01-30 NOTE — Discharge Instructions (Signed)
Nausea and Vomiting °Nausea is a sick feeling that often comes before throwing up (vomiting). Vomiting is a reflex where stomach contents come out of your mouth. Vomiting can cause severe loss of body fluids (dehydration). Children and elderly adults can become dehydrated quickly, especially if they also have diarrhea. Nausea and vomiting are symptoms of a condition or disease. It is important to find the cause of your symptoms. °CAUSES  °· Direct irritation of the stomach lining. This irritation can result from increased acid production (gastroesophageal reflux disease), infection, food poisoning, taking certain medicines (such as nonsteroidal anti-inflammatory drugs), alcohol use, or tobacco use. °· Signals from the brain. These signals could be caused by a headache, heat exposure, an inner ear disturbance, increased pressure in the brain from injury, infection, a tumor, or a concussion, pain, emotional stimulus, or metabolic problems. °· An obstruction in the gastrointestinal tract (bowel obstruction). °· Illnesses such as diabetes, hepatitis, gallbladder problems, appendicitis, kidney problems, cancer, sepsis, atypical symptoms of a heart attack, or eating disorders. °· Medical treatments such as chemotherapy and radiation. °· Receiving medicine that makes you sleep (general anesthetic) during surgery. °DIAGNOSIS °Your caregiver may ask for tests to be done if the problems do not improve after a few days. Tests may also be done if symptoms are severe or if the reason for the nausea and vomiting is not clear. Tests may include: °· Urine tests. °· Blood tests. °· Stool tests. °· Cultures (to look for evidence of infection). °· X-rays or other imaging studies. °Test results can help your caregiver make decisions about treatment or the need for additional tests. °TREATMENT °You need to stay well hydrated. Drink frequently but in small amounts. You may wish to drink water, sports drinks, clear broth, or eat frozen  ice pops or gelatin dessert to help stay hydrated. When you eat, eating slowly may help prevent nausea. There are also some antinausea medicines that may help prevent nausea. °HOME CARE INSTRUCTIONS  °· Take all medicine as directed by your caregiver. °· If you do not have an appetite, do not force yourself to eat. However, you must continue to drink fluids. °· If you have an appetite, eat a normal diet unless your caregiver tells you differently. °¨ Eat a variety of complex carbohydrates (rice, wheat, potatoes, bread), lean meats, yogurt, fruits, and vegetables. °¨ Avoid high-fat foods because they are more difficult to digest. °· Drink enough water and fluids to keep your urine clear or pale yellow. °· If you are dehydrated, ask your caregiver for specific rehydration instructions. Signs of dehydration may include: °¨ Severe thirst. °¨ Dry lips and mouth. °¨ Dizziness. °¨ Dark urine. °¨ Decreasing urine frequency and amount. °¨ Confusion. °¨ Rapid breathing or pulse. °SEEK IMMEDIATE MEDICAL CARE IF:  °· You have blood or Balazs flecks (like coffee grounds) in your vomit. °· You have black or bloody stools. °· You have a severe headache or stiff neck. °· You are confused. °· You have severe abdominal pain. °· You have chest pain or trouble breathing. °· You do not urinate at least once every 8 hours. °· You develop cold or clammy skin. °· You continue to vomit for longer than 24 to 48 hours. °· You have a fever. °MAKE SURE YOU:  °· Understand these instructions. °· Will watch your condition. °· Will get help right away if you are not doing well or get worse. °Document Released: 06/06/2005 Document Revised: 08/29/2011 Document Reviewed: 11/03/2010 °[employer]® Patient Information ©2015 [employer]. This information is not intended   to replace advice given to you by your health care provider. Make sure you discuss any questions you have with your health care provider.   Galactorrhea Galactorrhea is when there is a  milky nipple discharge. It is different from normal milk in nursing mothers. It usually comes from both nipples. Galactorrhea is not a disease but may be a symptom of a problem. It may continue for years after weaning. Galactorrhea is caused by the hormone prolactin, which stimulates milk production. If the breast discharge looks like pus, is bloody or if there is a lump present in the affected breast, the discharge may be caused by other problems including:  A benign cyst.  Papilloma.  Breast cancer.  A breast infection.  A breast abscess. It can also be seen in men who have a low or absent female hormone (testosterone) level. Galactorrhea can be present in a newborn if the mother had high female hormone (estrogen) levels that crossed into the baby through the placenta. The baby usually has enlarged breasts, but in time, it all goes away on its own. CAUSES   Tumor of the pituitary gland in the brain.  Problems with the hypothalamus in the brain that stimulates the pituitary gland.  Low thyroid function (hypothyroid disease).  Chronic kidney failure.  Medications, antidepressants, tranquilizers and blood pressure medication.  Herbal medications (nettle, fennel, blessed thistle, anise and fenugreek seed).  Illegal drugs (marijuana and opiates).  Breast stimulation during sexual activity or too many and frequent self breast exams.  Birth control pills.  Surgery or trauma to the breast causing nerve damage.  Spinal cord injury. SYMPTOMS   White, yellow or green discharge from one or both breasts.  No menstrual period (amenorrhea) or infrequent menstrual periods (hypomenorrhea).  Hot flashes, lack of sexual desire or vaginal dryness.  Infertility in women and men.  Headaches and vision problems.  Decrease in calcium in your bones (developing osteopenia or osteoporosis). DIAGNOSIS  Your caregiver may be able to know your problem by taking a detailed history and physical exam  of you. Tests that may be done, include:  Blood tests to check for the prolactin hormone, your female and thyroid hormones and a pregnancy test.  A detailed eye exam.  Mammogram.  X-rays, CT scan or MRI of breasts or your brain looking for a tumor. TREATMENT   Stopping medications that may be causing the galactorrhea.  Treating low thyroid function with thyroid hormones.  Medical or surgical (if necessary) treatment of a pituitary gland tumor.  Medication to lower the prolactin hormone level when no cause can be found.  Surgery as a last resort to remove the breasts ducts if the discharge persists with treatment and is a problem.  Treatment may not be necessary if you are not bothered by the breast discharge. HOME CARE INSTRUCTIONS   Before seeing your caregiver, make a list of all your symptoms, medications, when the breast discharge started and questions you may have.  Avoid breast stimulation during sexual activity.  Perform breast self exam once a month.  Avoid clothes that rub on your nipples.  Use breasts pads to absorb the discharge.  Wear a support bra. SEEK MEDICAL CARE IF:   You have galactorrhea and you are trying to get pregnant.  You develop hot flashes, vaginal dryness or lack of sexual desire.  You stop having menstrual periods or they are irregular or far apart.  You have headaches.  You have vision problems. SEEK IMMEDIATE MEDICAL CARE IF:  Your breast discharge is bloody or pus-like.  You have breast pain.  You feel a lump in your breast.  Your breast shows wrinkling or dimpling.  Your breast becomes red and swollen. Document Released: 07/14/2004 Document Revised: 08/29/2011 Document Reviewed: 05/27/2008 [facility] Patient Information 2015 [place], [place]. This information is not intended to replace advice given to you by your health care provider. Make sure you discuss any questions you have with your health care provider.

## 2014-01-30 NOTE — MAU Provider Note (Signed)
Chief Complaint: No chief complaint on file.   First Provider Initiated Contact with Patient 01/30/14 1224     SUBJECTIVE HPI: Kayla Dunn is a 33 y.o. [phone] female who presents with N/V, breast tenderness and white nipple discharge x 2 days. Similar sx in the past month, but getting worse. [facility]. Complete exam, pelvic, UA, UPT done. Thought she had a blood pregnancy test done, but not seen in Epic. Very concerned that she has undiagnosed pregnancy. Has visit w/ [facility] next month to eval N/V. Does not have Gyn. Denies nipple stimulation. Youngest child 32 years-old. Hasn't breast-fed s >2 years.   Past Medical History  Diagnosis Date  . Insomnia   . Anemia   . Bronchitis   . GERD (gastroesophageal reflux disease)   . Migraines   . Seasonal allergies   . Pancreatitis   . Heart murmur   . Hypertension     "havent been taking my medicine in awhile"  . Pregnancy induced hypertension   . Preterm labor   . Arthritis   . Depression     no meds, fine right now  . Insomnia   . PID (acute pelvic inflammatory disease)    OB History  Gravida Para Term Preterm AB SAB TAB Ectopic Multiple Living  9 4 1 3 5 5    4     # Outcome Date GA Lbr Len/2nd Weight Sex Delivery Anes PTL Lv  9 PRE 10/09/10    F CS  Y Y     Comments: twin gest, lost one around 12-13wks; BP elevation, border gest dm;   8 PRE 02/24/07   2.637 kg (5 lb 13 oz) F SVD None Y Y     Comments: post partem hemmorage, went into shock  7 PRE 01/28/02    M SVD EPI Y Y     Comments: GBS  6 TRM 09/06/97   3.402 kg (7 lb 8 oz) F CS EPI N Y     Comments: "young, couldn't get her out"; pre-eclampsia  5 SAB           4 SAB           3 SAB           2 SAB           1 SAB              Past Surgical History  Procedure Laterality Date  . Cesarean section    . Tubal ligation    . Ovary surgery    . Cholecystectomy N/A 08/27/2013    Procedure: LAPAROSCOPIC CHOLECYSTECTOMY WITH INTRAOPERATIVE  CHOLANGIOGRAM;  Surgeon: Valarie MerinoMatthew B Martin, MD;  Location: [facility];  Service: General;  Laterality: N/A;  . Dilation and curettage of uterus     History   Social History  . Marital Status: Single    Spouse Name: N/A    Number of Children: N/A  . Years of Education: N/A   Occupational History  . Not on file.   Social History Main Topics  . Smoking status: Light Tobacco Smoker -- 0.25 packs/day for 12 years  . Smokeless tobacco: Never Used  . Alcohol Use: No     Comment: socially  . Drug Use: Yes    Special: Marijuana     Comment: joint nightly  . Sexual Activity: Yes    Birth Control/ Protection: Surgical   Other Topics Concern  . Not on file  Social History Narrative  . No narrative on file   No current facility-administered medications on file prior to encounter.   Current Outpatient Prescriptions on File Prior to Encounter  Medication Sig Dispense Refill  . acetaminophen (TYLENOL ARTHRITIS PAIN) 650 MG CR tablet Take 1,300 mg by mouth every 8 (eight) hours as needed for pain.      Marland Kitchen alum & mag hydroxide-simeth (MAALOX/MYLANTA) 200-200-20 MG/5ML suspension Take 15 mLs by mouth every 6 (six) hours as needed for indigestion or heartburn.  355 mL  0  . fluconazole (DIFLUCAN) 200 MG tablet Take 1 tablet (200 mg total) by mouth daily.  7 tablet  0  . ranitidine (ZANTAC) 75 MG tablet Take 75 mg by mouth 2 (two) times daily.      . simethicone (MYLICON) 80 MG chewable tablet Chew 80 mg by mouth daily as needed for flatulence.        Allergies  Allergen Reactions  . Aspirin Anaphylaxis  . Hydromorphone Hcl Anaphylaxis  . Morphine Hives    ROS: Pertinent pos items in HPI. Denies fever, chills, breast mass, diarrhea, abd pain, sick contacts.    OBJECTIVE Blood pressure 131/65, pulse 81, temperature 98.7 F (37.1 C), temperature source Oral, resp. rate 17, weight 57.153 kg (126 lb), last menstrual period 01/01/2014. GENERAL: Well-developed, well-nourished female in no acute  distress.  HEENT: Normocephalic HEART: normal rate RESP: normal effort ABDOMEN: Soft, non-tender EXTREMITIES: Nontender, no edema NEURO: Alert and oriented SPECULUM EXAM: Deferred  LAB RESULTS Results for orders placed during the hospital encounter of 01/30/14 (from the past 24 hour(s))  HCG, QUANTITATIVE, PREGNANCY     Status: None   Collection Time    01/30/14 12:49 PM      Result Value Ref Range   hCG, Beta Chain, Quant, S <1  <5 mIU/mL    IMAGING No results found.  MAU COURSE Quant, Prolactin,  ASSESSMENT 1. Nausea and vomiting, vomiting of unspecified type   2. Galactorrhea in female     PLAN Discharge home in stable condition. Prolactin pending.     Follow-up Information   Follow up with [facility]. (As scheduled)    Contact information:   [address] [place] [address] [phone]       Medication List         alum & mag hydroxide-simeth 200-200-20 MG/5ML suspension  Commonly known as:  MAALOX/MYLANTA  Take 15 mLs by mouth every 6 (six) hours as needed for indigestion or heartburn.     fluconazole 200 MG tablet  Commonly known as:  DIFLUCAN  Take 1 tablet (200 mg total) by mouth daily.     ondansetron 4 MG tablet  Commonly known as:  ZOFRAN  Take 1 tablet (4 mg total) by mouth every 8 (eight) hours as needed for nausea or vomiting.     ranitidine 75 MG tablet  Commonly known as:  ZANTAC  Take 75 mg by mouth 2 (two) times daily.     simethicone 80 MG chewable tablet  Commonly known as:  MYLICON  Chew 80 mg by mouth daily as needed for flatulence.     TYLENOL ARTHRITIS PAIN 650 MG CR tablet  Generic drug:  acetaminophen  Take 1,300 mg by mouth every 8 (eight) hours as needed for pain.       [place], CNM 01/30/2014  1:56 PM

## 2014-01-30 NOTE — MAU Note (Addendum)
[facility] for [facility] (nausea). Denies vomiting, fever, has not been exposed to anyone with symptoms

## 2014-01-30 NOTE — Discharge Instructions (Signed)
Nausea and Vomiting °Nausea is a sick feeling that often comes before throwing up (vomiting). Vomiting is a reflex where stomach contents come out of your mouth. Vomiting can cause severe loss of body fluids (dehydration). Children and elderly adults can become dehydrated quickly, especially if they also have diarrhea. Nausea and vomiting are symptoms of a condition or disease. It is important to find the cause of your symptoms. °CAUSES  °· Direct irritation of the stomach lining. This irritation can result from increased acid production (gastroesophageal reflux disease), infection, food poisoning, taking certain medicines (such as nonsteroidal anti-inflammatory drugs), alcohol use, or tobacco use. °· Signals from the brain. These signals could be caused by a headache, heat exposure, an inner ear disturbance, increased pressure in the brain from injury, infection, a tumor, or a concussion, pain, emotional stimulus, or metabolic problems. °· An obstruction in the gastrointestinal tract (bowel obstruction). °· Illnesses such as diabetes, hepatitis, gallbladder problems, appendicitis, kidney problems, cancer, sepsis, atypical symptoms of a heart attack, or eating disorders. °· Medical treatments such as chemotherapy and radiation. °· Receiving medicine that makes you sleep (general anesthetic) during surgery. °DIAGNOSIS °Your caregiver may ask for tests to be done if the problems do not improve after a few days. Tests may also be done if symptoms are severe or if the reason for the nausea and vomiting is not clear. Tests may include: °· Urine tests. °· Blood tests. °· Stool tests. °· Cultures (to look for evidence of infection). °· X-rays or other imaging studies. °Test results can help your caregiver make decisions about treatment or the need for additional tests. °TREATMENT °You need to stay well hydrated. Drink frequently but in small amounts. You may wish to drink water, sports drinks, clear broth, or eat frozen  ice pops or gelatin dessert to help stay hydrated. When you eat, eating slowly may help prevent nausea. There are also some antinausea medicines that may help prevent nausea. °HOME CARE INSTRUCTIONS  °· Take all medicine as directed by your caregiver. °· If you do not have an appetite, do not force yourself to eat. However, you must continue to drink fluids. °· If you have an appetite, eat a normal diet unless your caregiver tells you differently. °¨ Eat a variety of complex carbohydrates (rice, wheat, potatoes, bread), lean meats, yogurt, fruits, and vegetables. °¨ Avoid high-fat foods because they are more difficult to digest. °· Drink enough water and fluids to keep your urine clear or pale yellow. °· If you are dehydrated, ask your caregiver for specific rehydration instructions. Signs of dehydration may include: °¨ Severe thirst. °¨ Dry lips and mouth. °¨ Dizziness. °¨ Dark urine. °¨ Decreasing urine frequency and amount. °¨ Confusion. °¨ Rapid breathing or pulse. °SEEK IMMEDIATE MEDICAL CARE IF:  °· You have blood or Verdone flecks (like coffee grounds) in your vomit. °· You have black or bloody stools. °· You have a severe headache or stiff neck. °· You are confused. °· You have severe abdominal pain. °· You have chest pain or trouble breathing. °· You do not urinate at least once every 8 hours. °· You develop cold or clammy skin. °· You continue to vomit for longer than 24 to 48 hours. °· You have a fever. °MAKE SURE YOU:  °· Understand these instructions. °· Will watch your condition. °· Will get help right away if you are not doing well or get worse. °Document Released: 06/06/2005 Document Revised: 08/29/2011 Document Reviewed: 11/03/2010 °ExitCare® Patient Information ©2015 [employer]. This information is not intended   to replace advice given to you by your health care provider. Make sure you discuss any questions you have with your health care provider.   Vaginitis Vaginitis is an inflammation of the  vagina. It is most often caused by a change in the normal balance of the bacteria and yeast that live in the vagina. This change in balance causes an overgrowth of certain bacteria or yeast, which causes the inflammation. There are different types of vaginitis, but the most common types are:  Bacterial vaginosis.  Yeast infection (candidiasis).  Trichomoniasis vaginitis. This is a sexually transmitted infection (STI).  Viral vaginitis.  Atropic vaginitis.  Allergic vaginitis. CAUSES  The cause depends on the type of vaginitis. Vaginitis can be caused by:  Bacteria (bacterial vaginosis).  Yeast (yeast infection).  A parasite (trichomoniasis vaginitis)  A virus (viral vaginitis).  Low hormone levels (atrophic vaginitis). Low hormone levels can occur during pregnancy, breastfeeding, or after menopause.  Irritants, such as bubble baths, scented tampons, and feminine sprays (allergic vaginitis). Other factors can change the normal balance of the yeast and bacteria that live in the vagina. These include:  Antibiotic medicines.  Poor hygiene.  Diaphragms, vaginal sponges, spermicides, birth control pills, and intrauterine devices (IUD).  Sexual intercourse.  Infection.  Uncontrolled diabetes.  A weakened immune system. SYMPTOMS  Symptoms can vary depending on the cause of the vaginitis. Common symptoms include:  Abnormal vaginal discharge.  The discharge is white, gray, or yellow with bacterial vaginosis.  The discharge is thick, white, and cheesy with a yeast infection.  The discharge is frothy and yellow or greenish with trichomoniasis.  A bad vaginal odor.  The odor is fishy with bacterial vaginosis.  Vaginal itching, pain, or swelling.  Painful intercourse.  Pain or burning when urinating. Sometimes, there are no symptoms. TREATMENT  Treatment will vary depending on the type of infection.   Bacterial vaginosis and trichomoniasis are often treated with  antibiotic creams or pills.  Yeast infections are often treated with antifungal medicines, such as vaginal creams or suppositories.  Viral vaginitis has no cure, but symptoms can be treated with medicines that relieve discomfort. Your sexual partner should be treated as well.  Atrophic vaginitis may be treated with an estrogen cream, pill, suppository, or vaginal ring. If vaginal dryness occurs, lubricants and moisturizing creams may help. You may be told to avoid scented soaps, sprays, or douches.  Allergic vaginitis treatment involves quitting the use of the product that is causing the problem. Vaginal creams can be used to treat the symptoms. HOME CARE INSTRUCTIONS   Take all medicines as directed by your caregiver.  Keep your genital area clean and dry. Avoid soap and only rinse the area with water.  Avoid douching. It can remove the healthy bacteria in the vagina.  Do not use tampons or have sexual intercourse until your vaginitis has been treated. Use sanitary pads while you have vaginitis.  Wipe from front to back. This avoids the spread of bacteria from the rectum to the vagina.  Let air reach your genital area.  Wear cotton underwear to decrease moisture buildup.  Avoid wearing underwear while you sleep until your vaginitis is gone.  Avoid tight pants and underwear or nylons without a cotton panel.  Take off wet clothing (especially bathing suits) as soon as possible.  Use mild, non-scented products. Avoid using irritants, such as:  Scented feminine sprays.  Fabric softeners.  Scented detergents.  Scented tampons.  Scented soaps or bubble baths.  Practice  safe sex and use condoms. Condoms may prevent the spread of trichomoniasis and viral vaginitis. SEEK MEDICAL CARE IF:   You have abdominal pain.  You have a fever or persistent symptoms for more than 2-3 days.  You have a fever and your symptoms suddenly get worse. Document Released: 04/03/2007 Document  Revised: 02/29/2012 Document Reviewed: 11/17/2011 [facility] Patient Information 2015 [place], [place]. This information is not intended to replace advice given to you by your health care provider. Make sure you discuss any questions you have with your health care provider.

## 2014-01-30 NOTE — MAU Note (Signed)
[facility].  Reports symptoms of preg, vomiting, breast lactating, tired- has had tubal.  Hx of irreg and heavy. Last month was lighter but normal. Has an appt at [facility]- mid Sept.

## 2014-01-30 NOTE — MAU Provider Note (Signed)

## 2014-01-31 LAB — PROLACTIN: PROLACTIN: 4.9 ng/mL

## 2014-02-01 NOTE — ED Provider Notes (Signed)
Medical screening examination/treatment/procedure(s) were performed by non-physician practitioner and as supervising physician I was immediately available for consultation/collaboration.   EKG Interpretation None       Ethelda ChickMartha K Linker, MD 02/01/14 [phone]

## 2014-02-06 ENCOUNTER — Telehealth: Payer: Self-pay | Admitting: *Deleted

## 2014-02-06 NOTE — Telephone Encounter (Signed)
Message copied by Dorothyann PengHAIZLIP, Mikena Masoner E on Thu Feb 06, 2014  1:29 PM ------      Message from: [place]      Created: Thu Feb 06, 2014 11:55 AM       Please inform pt of normal prolactin. F/U w/ PCP PRN. ------

## 2014-02-06 NOTE — Telephone Encounter (Signed)
Attempted to contact patient, no answer, left message for patient to call clinic for results. 

## 2014-02-10 NOTE — Telephone Encounter (Signed)
Called patient, no answer- left message stating we are trying to reach you with some results, nothing urgent but please call us back at the clinics 

## 2014-02-11 ENCOUNTER — Telehealth: Payer: Self-pay | Admitting: *Deleted

## 2014-02-11 ENCOUNTER — Encounter: Payer: Self-pay | Admitting: General Practice

## 2014-02-11 ENCOUNTER — Encounter: Payer: Self-pay | Admitting: *Deleted

## 2014-02-11 NOTE — Telephone Encounter (Signed)
Pt left message requesting test results. I returned her call and informed her of lab test results from 8/13. I assured her that her pregnancy test was negative and her prolactin level is normal. She may follow up as needed with her PCP. Pt then asked when she will stop lactating. She re-iterated that she had her baby 3.5 yrs ago and she does not do any type of breast/nipple stimulation. I told her that I did not have the answer but I will forward her question on to [place].  We will call her back when a response has been received.

## 2014-02-11 NOTE — Telephone Encounter (Signed)
Attempted to call patient. No answer. Left message stating we are calling with results, please call clinic. Will send letter.

## 2014-02-13 ENCOUNTER — Other Ambulatory Visit: Payer: Self-pay | Admitting: Advanced Practice Midwife

## 2014-02-13 DIAGNOSIS — N6452 Nipple discharge: Secondary | ICD-10-CM

## 2014-02-13 NOTE — Telephone Encounter (Signed)
Spoke to [place], CNM and Dr. Debroah Loop who recommend next step would be imaging study/breast U/S. Called breast center at [phone]. Diagnostic mammogram ordered with bilateral U/S. Appointment scheduled for 02/21/14 0900 at [facility] ([address]). Patient to be advised to bring insurance card and photo ID. She may not wear any deodorants or lotions. Attempted to call patient. No answer. Left message stating we are calling with follow up information, please call clinic.

## 2014-02-14 NOTE — Telephone Encounter (Signed)
Called pt and left message that I am calling with additional information related to her concern. Please call back and leave a message stating whether it is ok to leave a detailed message on her voice mail.

## 2014-02-17 NOTE — Telephone Encounter (Signed)
Called patient and informed her of appointment at [facility]. Gave her number to call, date, time and location as well as directions to bring insurance card, photo ID. Explained she should not apply powders, deodorants or lotions before visit. Patient verbalized understanding and gratitude. No questions or concerns.

## 2014-02-19 ENCOUNTER — Encounter: Payer: Self-pay | Admitting: General Practice

## 2014-02-21 ENCOUNTER — Other Ambulatory Visit: Payer: Medicaid Other

## 2014-03-03 ENCOUNTER — Ambulatory Visit: Payer: Medicaid Other

## 2014-03-03 ENCOUNTER — Encounter: Payer: Self-pay | Admitting: Obstetrics and Gynecology

## 2014-03-03 ENCOUNTER — Ambulatory Visit (INDEPENDENT_AMBULATORY_CARE_PROVIDER_SITE_OTHER): Payer: Medicaid Other | Admitting: Obstetrics and Gynecology

## 2014-03-03 VITALS — BP 130/74 | HR 78 | Temp 98.0°F | Ht 60.0 in | Wt 128.3 lb

## 2014-03-03 DIAGNOSIS — M25531 Pain in right wrist: Secondary | ICD-10-CM | POA: Insufficient documentation

## 2014-03-03 DIAGNOSIS — M25532 Pain in left wrist: Secondary | ICD-10-CM

## 2014-03-03 DIAGNOSIS — K2970 Gastritis, unspecified, without bleeding: Secondary | ICD-10-CM | POA: Diagnosis present

## 2014-03-03 DIAGNOSIS — D509 Iron deficiency anemia, unspecified: Secondary | ICD-10-CM | POA: Diagnosis not present

## 2014-03-03 DIAGNOSIS — K2990 Gastroduodenitis, unspecified, without bleeding: Secondary | ICD-10-CM | POA: Diagnosis present

## 2014-03-03 DIAGNOSIS — F17200 Nicotine dependence, unspecified, uncomplicated: Secondary | ICD-10-CM | POA: Diagnosis not present

## 2014-03-03 DIAGNOSIS — M25539 Pain in unspecified wrist: Secondary | ICD-10-CM | POA: Diagnosis not present

## 2014-03-03 DIAGNOSIS — Z23 Encounter for immunization: Secondary | ICD-10-CM

## 2014-03-03 DIAGNOSIS — R030 Elevated blood-pressure reading, without diagnosis of hypertension: Secondary | ICD-10-CM

## 2014-03-03 LAB — CBC
HEMATOCRIT: 32.4 % — AB (ref 36.0–46.0)
HEMOGLOBIN: 10.7 g/dL — AB (ref 12.0–15.0)
MCH: 28.6 pg (ref 26.0–34.0)
MCHC: 33 g/dL (ref 30.0–36.0)
MCV: 86.6 fL (ref 78.0–100.0)
Platelets: 406 10*3/uL — ABNORMAL HIGH (ref 150–400)
RBC: 3.74 MIL/uL — ABNORMAL LOW (ref 3.87–5.11)
RDW: 14.7 % (ref 11.5–15.5)
WBC: 6.9 10*3/uL (ref 4.0–10.5)

## 2014-03-03 LAB — COMPREHENSIVE METABOLIC PANEL
ALK PHOS: 61 U/L (ref 39–117)
ALT: 17 U/L (ref 0–35)
AST: 22 U/L (ref 0–37)
Albumin: 4 g/dL (ref 3.5–5.2)
BUN: 9 mg/dL (ref 6–23)
CALCIUM: 9 mg/dL (ref 8.4–10.5)
CO2: 28 mEq/L (ref 19–32)
CREATININE: 0.66 mg/dL (ref 0.50–1.10)
Chloride: 104 mEq/L (ref 96–112)
Glucose, Bld: 77 mg/dL (ref 70–99)
Potassium: 4.3 mEq/L (ref 3.5–5.3)
Sodium: 137 mEq/L (ref 135–145)
Total Bilirubin: 0.3 mg/dL (ref 0.2–1.2)
Total Protein: 6.5 g/dL (ref 6.0–8.3)

## 2014-03-03 LAB — IRON AND TIBC
%SAT: 5 % — ABNORMAL LOW (ref 20–55)
Iron: 19 ug/dL — ABNORMAL LOW (ref 42–145)
TIBC: 387 ug/dL (ref 250–470)
UIBC: 368 ug/dL (ref 125–400)

## 2014-03-03 LAB — RHEUMATOID FACTOR

## 2014-03-03 LAB — LIPASE: LIPASE: 24 U/L (ref 0–75)

## 2014-03-03 MED ORDER — NICOTINE 7 MG/24HR TD PT24: 7.0000 mg | MEDICATED_PATCH | Freq: Every day | TRANSDERMAL | Status: DC

## 2014-03-03 MED ORDER — FERROUS SULFATE 324 (65 FE) MG PO TBEC: 1.0000 | DELAYED_RELEASE_TABLET | Freq: Every day | ORAL | Status: DC

## 2014-03-03 NOTE — Patient Instructions (Addendum)
Kayla Dunn it was great to see you today!  I am sorry to hear that you have been having several different issues happening lately.  Here are some of the things we discussed today: -Reschedule another appointment in 2 weeks and we can discuss results and other concerns. -No need for blood pressure medication at this time. If it is still elevated at next visit we will start medications. -Please call our dietitian and schedule an appointment. She can help you with appetite concerns.   New medications: -Please use vitamin D and iron supplements. -I prescribed you a nicotine patch.  Please schedule a follow-up appointment for 2-4 weeks.   Thanks for allowing me to be a part of your care! Dr. Doroteo Glassman

## 2014-03-03 NOTE — Assessment & Plan Note (Signed)
Will monitor at this time. Does not appear that patient has hypertension based off BP recheck today and past BPs. It seems to be only gestationally induced.

## 2014-03-03 NOTE — Assessment & Plan Note (Signed)
A: Appears patient may still have gastritis symptoms. P: Labs were ordered. A nutrition referral was placed. Patient given Dr. Gerilyn Pilgrim card. Patient educated on decreasing marijuana use. May cause cyclical vomiting. She is to continue with food as tolerated. Bland diet may be best option at this time.

## 2014-03-03 NOTE — Progress Notes (Signed)
Patient ID: Kayla Dunn, female   DOB: October 09, 1980, 33 y.o.   MRN: [id number]     Subjective:  Chief Complaint  Patient presents with  . Establish Care    HPI: Kayla Dunn is a 33 y.o. presenting to clinic today to establish as a new patient. She had many concerns to discuss today. 2 nystatin was initiated. Below are the topics we discussed:  #Appetitie Concerns - patient states that ever since her cholecystectomy she has been having issues with appetite. She endorses always being hungry. She eats every 2 hrs to 3 hours. States that she always feels "sick" with food. Describes small meals mainly lunchables or crackers. She says that she has nausea and vomiting daily. Also says there is associated constipation she also has abdominal cramps when she does eat. She previously has had a diagnosis of gastritis and pancreatitis and cholecystitis. After her surgery she was instructed to start a BRAT diet. Patient still on this diet and says it helps some. She also has tried the all liquid diet which is to some benefit when her stomach is really upset. She is concerned about being able to eat well. Patient states that she uses marijuana daily for appetite and to decrease symptoms.  #Joints - patient complains of joint stiffening, popping, and swelling. She says this has been occurring for the last 12 years. She also endorses being double jointed. She says in the past she had steroid injections. She cannot remember if a diagnosis of arthritis versus carpal tunnel was ever made. Patient states that she uses massage techniques to help with the cramping.   #HTN Patient states that her blood pressures were elevated with pregnancy.  Blood pressure at home: Patient does not take blood pressure at home. Blood pressure today: 150/85 recheck was 130/74. Taking Meds: Currently not taking any medications. When she was pregnant she was on labetalol. ROS: Denies headache, dizziness, visual changes, nausea,  vomiting, chest pain, abdominal pain or shortness of breath.  #Health Maintenance: Flu shot, Pap smear, and tetanus shot due.  All systems were reviewed and were negative unless otherwise noted in the HPI Past Medical, Surgical, Social, and Family History Reviewed & Updated per EMR.   Objective: BP 130/74  Pulse 78  Temp(Src) 98 F (36.7 C) (Oral)  Ht 5' (1.524 m)  Wt 128 lb 4.8 oz (58.196 kg)  BMI 25.06 kg/m2  LMP 03/02/2014 General: alert, well-developed, NAD, cooperative, A&Ox3 HEENT: NCAT. MMM. EOMI. Anicteric.  Neck: supple, no thyromegaly. Lungs: CTAB, no wheezes. Heart: RRR, no M/R/G appreciated. Abdomen: soft, NT, ND, BS present and normoactive, no hepatosplenomegaly, no palpable masses. Loose skin present. Diffuse stretch marks. Extremities: No edema Neurologic: Cranial nerves grossly intact. Gait normal. Decreased strength noted in right hand. Skin: turgor normal and no rashes.    Assessment/Plan: Please see problem based Assessment and Plan  Health Maintainance: Flu shot given. Pap smear deferred until next visit.  Patient to return in 2 weeks.  Caryl Ada, DO 03/03/2014, 12:10 PM PGY-1, [facility]

## 2014-03-03 NOTE — Assessment & Plan Note (Signed)
Patient wants tobacco cessation. She smokes 3-4 cigarettes a day. Nicotine patch was ordered. Patient given quit hotline number.

## 2014-03-03 NOTE — Assessment & Plan Note (Signed)
Iron studies ordered. Patient has not been on any iron supplements for her anemia. Iron supplement sent to pharmacy.

## 2014-03-03 NOTE — Assessment & Plan Note (Addendum)
Patient instructed to continue with conservative measures and medications at this time. A RF was ordered. Will consider sports medicine referral at next visit and possible wrist [place].

## 2014-03-11 ENCOUNTER — Other Ambulatory Visit: Payer: Medicaid Other

## 2014-03-11 ENCOUNTER — Inpatient Hospital Stay: Admission: RE | Admit: 2014-03-11 | Payer: Medicaid Other | Source: Ambulatory Visit

## 2014-04-10 ENCOUNTER — Ambulatory Visit: Payer: Medicaid Other | Admitting: Family Medicine

## 2014-04-21 ENCOUNTER — Encounter: Payer: Self-pay | Admitting: Obstetrics and Gynecology

## 2014-09-11 ENCOUNTER — Encounter (HOSPITAL_COMMUNITY): Payer: Self-pay | Admitting: Emergency Medicine

## 2014-09-11 ENCOUNTER — Emergency Department (HOSPITAL_COMMUNITY): Payer: Self-pay

## 2014-09-11 ENCOUNTER — Emergency Department (HOSPITAL_COMMUNITY)
Admission: EM | Admit: 2014-09-11 | Discharge: 2014-09-11 | Disposition: A | Discharge status: Home / Self Care | Payer: Self-pay | Attending: Emergency Medicine | Admitting: Emergency Medicine

## 2014-09-11 ENCOUNTER — Emergency Department (HOSPITAL_COMMUNITY): Payer: Medicaid Other

## 2014-09-11 DIAGNOSIS — M1990 Unspecified osteoarthritis, unspecified site: Secondary | ICD-10-CM | POA: Insufficient documentation

## 2014-09-11 DIAGNOSIS — R0789 Other chest pain: Secondary | ICD-10-CM | POA: Insufficient documentation

## 2014-09-11 DIAGNOSIS — Z79899 Other long term (current) drug therapy: Secondary | ICD-10-CM | POA: Insufficient documentation

## 2014-09-11 DIAGNOSIS — Z8669 Personal history of other diseases of the nervous system and sense organs: Secondary | ICD-10-CM | POA: Insufficient documentation

## 2014-09-11 DIAGNOSIS — Z720 Tobacco use: Secondary | ICD-10-CM | POA: Insufficient documentation

## 2014-09-11 DIAGNOSIS — Z8709 Personal history of other diseases of the respiratory system: Secondary | ICD-10-CM | POA: Insufficient documentation

## 2014-09-11 DIAGNOSIS — R059 Cough, unspecified: Secondary | ICD-10-CM

## 2014-09-11 DIAGNOSIS — R011 Cardiac murmur, unspecified: Secondary | ICD-10-CM | POA: Insufficient documentation

## 2014-09-11 DIAGNOSIS — R112 Nausea with vomiting, unspecified: Secondary | ICD-10-CM

## 2014-09-11 DIAGNOSIS — R0981 Nasal congestion: Secondary | ICD-10-CM | POA: Insufficient documentation

## 2014-09-11 DIAGNOSIS — R05 Cough: Secondary | ICD-10-CM | POA: Insufficient documentation

## 2014-09-11 DIAGNOSIS — N8320 Unspecified ovarian cysts: Secondary | ICD-10-CM | POA: Insufficient documentation

## 2014-09-11 DIAGNOSIS — I10 Essential (primary) hypertension: Secondary | ICD-10-CM | POA: Insufficient documentation

## 2014-09-11 DIAGNOSIS — R062 Wheezing: Secondary | ICD-10-CM | POA: Insufficient documentation

## 2014-09-11 DIAGNOSIS — K219 Gastro-esophageal reflux disease without esophagitis: Secondary | ICD-10-CM | POA: Insufficient documentation

## 2014-09-11 DIAGNOSIS — R0602 Shortness of breath: Secondary | ICD-10-CM | POA: Insufficient documentation

## 2014-09-11 DIAGNOSIS — Z3202 Encounter for pregnancy test, result negative: Secondary | ICD-10-CM | POA: Insufficient documentation

## 2014-09-11 DIAGNOSIS — N83201 Unspecified ovarian cyst, right side: Secondary | ICD-10-CM

## 2014-09-11 DIAGNOSIS — D649 Anemia, unspecified: Secondary | ICD-10-CM | POA: Insufficient documentation

## 2014-09-11 DIAGNOSIS — Z8659 Personal history of other mental and behavioral disorders: Secondary | ICD-10-CM | POA: Insufficient documentation

## 2014-09-11 LAB — URINALYSIS, ROUTINE W REFLEX MICROSCOPIC
BILIRUBIN URINE: NEGATIVE
GLUCOSE, UA: NEGATIVE mg/dL
HGB URINE DIPSTICK: NEGATIVE
KETONES UR: 15 mg/dL — AB
LEUKOCYTES UA: NEGATIVE
NITRITE: NEGATIVE
Protein, ur: 30 mg/dL — AB
Specific Gravity, Urine: 1.031 — ABNORMAL HIGH (ref 1.005–1.030)
UROBILINOGEN UA: 1 mg/dL (ref 0.0–1.0)
pH: 8 (ref 5.0–8.0)

## 2014-09-11 LAB — CBC WITH DIFFERENTIAL/PLATELET
Basophils Absolute: 0 10*3/uL (ref 0.0–0.1)
Basophils Relative: 0 % (ref 0–1)
Eosinophils Absolute: 0 10*3/uL (ref 0.0–0.7)
Eosinophils Relative: 1 % (ref 0–5)
HCT: 34.1 % — ABNORMAL LOW (ref 36.0–46.0)
Hemoglobin: 10.7 g/dL — ABNORMAL LOW (ref 12.0–15.0)
LYMPHS PCT: 20 % (ref 12–46)
Lymphs Abs: 1.6 10*3/uL (ref 0.7–4.0)
MCH: 28 pg (ref 26.0–34.0)
MCHC: 31.4 g/dL (ref 30.0–36.0)
MCV: 89.3 fL (ref 78.0–100.0)
MONO ABS: 1 10*3/uL (ref 0.1–1.0)
MONOS PCT: 13 % — AB (ref 3–12)
NEUTROS ABS: 5.1 10*3/uL (ref 1.7–7.7)
Neutrophils Relative %: 66 % (ref 43–77)
Platelets: 318 10*3/uL (ref 150–400)
RBC: 3.82 MIL/uL — AB (ref 3.87–5.11)
RDW: 14.6 % (ref 11.5–15.5)
WBC: 7.7 10*3/uL (ref 4.0–10.5)

## 2014-09-11 LAB — URINE MICROSCOPIC-ADD ON

## 2014-09-11 LAB — COMPREHENSIVE METABOLIC PANEL
ALBUMIN: 4.1 g/dL (ref 3.5–5.2)
ALT: 15 U/L (ref 0–35)
AST: 24 U/L (ref 0–37)
Alkaline Phosphatase: 69 U/L (ref 39–117)
Anion gap: 8 (ref 5–15)
BUN: 13 mg/dL (ref 6–23)
CO2: 26 mmol/L (ref 19–32)
Calcium: 9 mg/dL (ref 8.4–10.5)
Chloride: 105 mmol/L (ref 96–112)
Creatinine, Ser: 0.65 mg/dL (ref 0.50–1.10)
GFR calc Af Amer: >90 mL/min (ref >90)
GFR calc non Af Amer: >90 mL/min (ref >90)
Glucose, Bld: 92 mg/dL (ref 70–99)
Potassium: 3.6 mmol/L (ref 3.5–5.1)
SODIUM: 139 mmol/L (ref 135–145)
Total Bilirubin: 0.2 mg/dL — ABNORMAL LOW (ref 0.3–1.2)
Total Protein: 7.4 g/dL (ref 6.0–8.3)

## 2014-09-11 LAB — WET PREP, GENITAL
TRICH WET PREP: NONE SEEN
YEAST WET PREP: NONE SEEN

## 2014-09-11 LAB — PREGNANCY, URINE: PREG TEST UR: NEGATIVE

## 2014-09-11 LAB — LIPASE, BLOOD: Lipase: 17 U/L (ref 11–59)

## 2014-09-11 IMAGING — CR DG CHEST 2V
2 series · 2 of 2 positions shown · non-contrast
Comparison: [DATE] and earlier.

CLINICAL DATA: 33-year-old female with nausea lower abdominal pain
and cough for several days. Initial encounter.

EXAM:
CHEST  2 VIEW

[w chest pa]
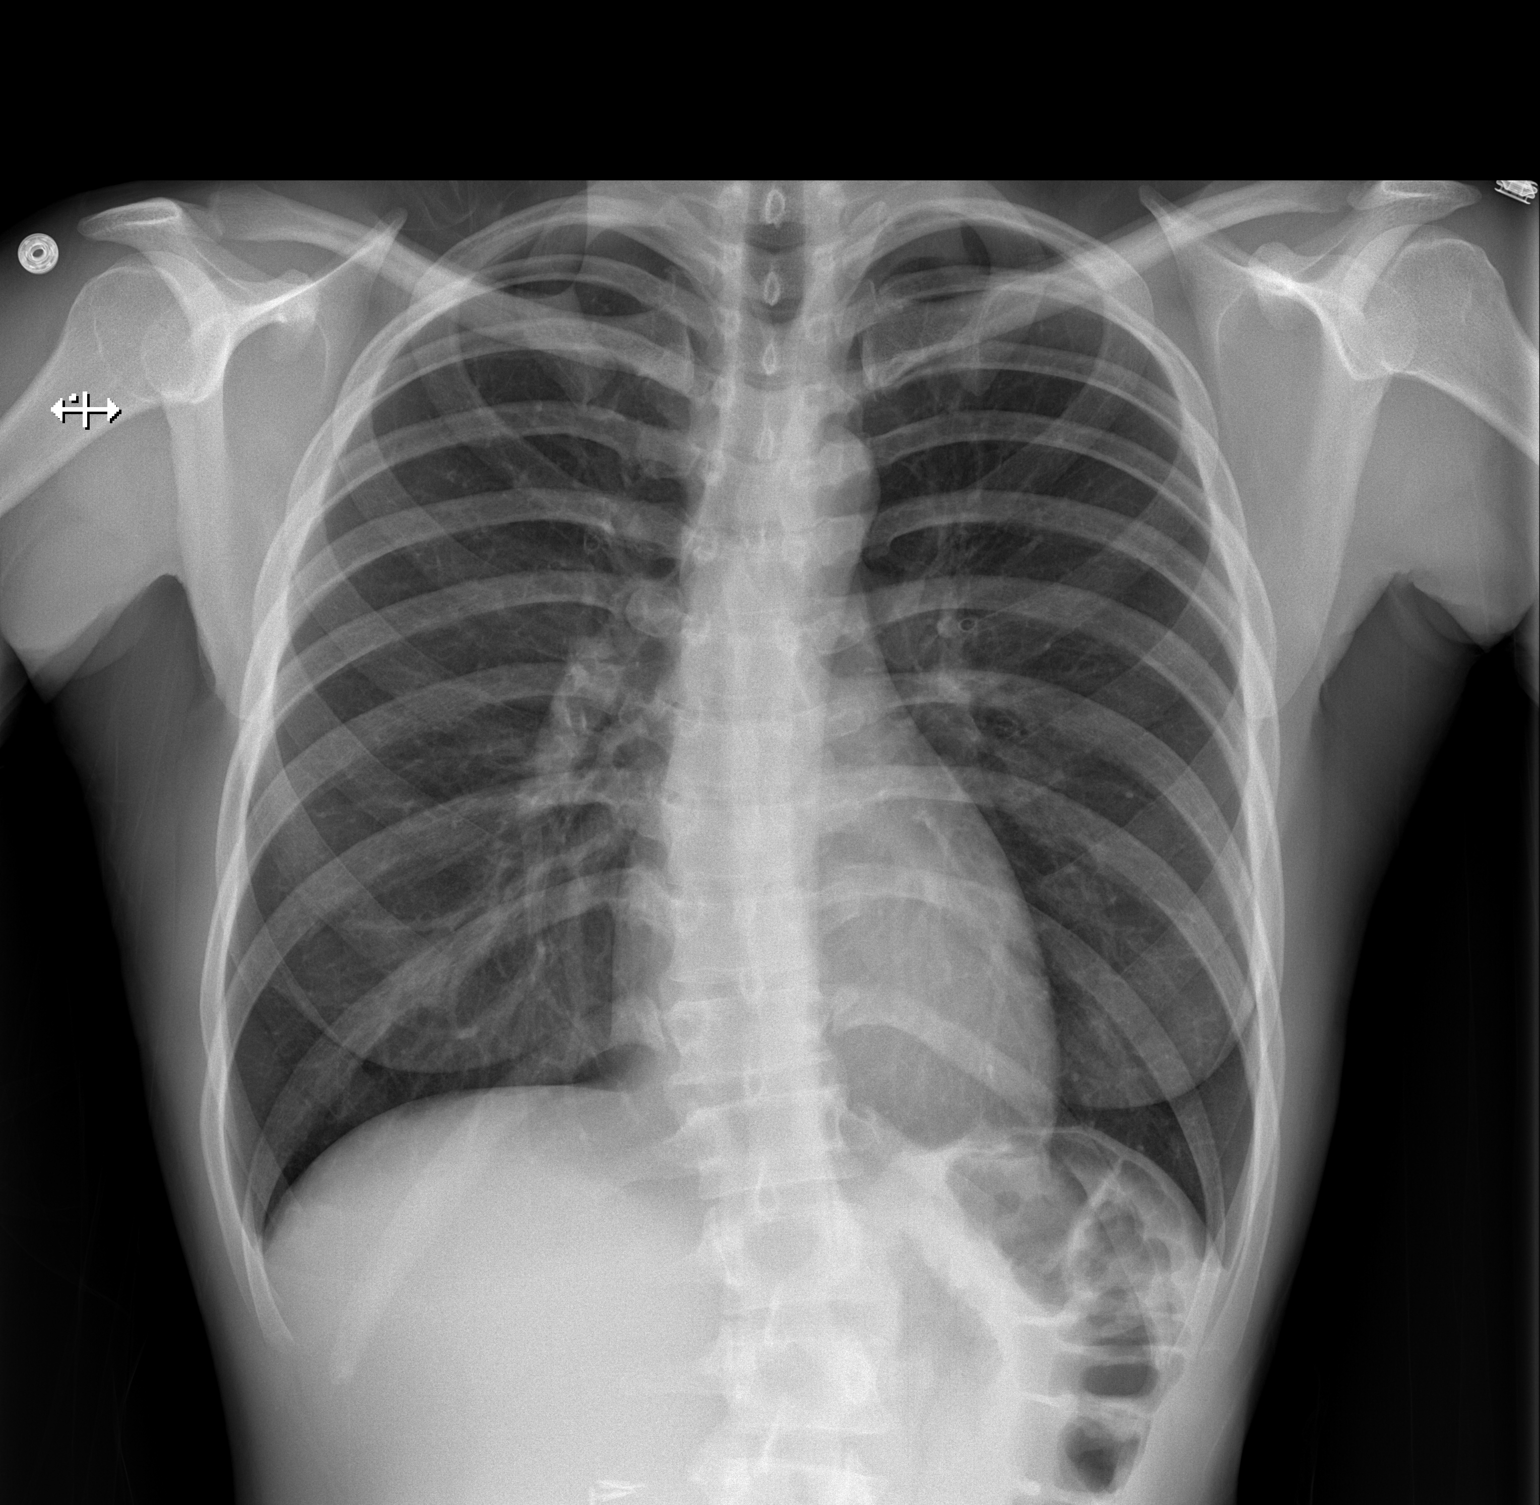

[w chest lat]
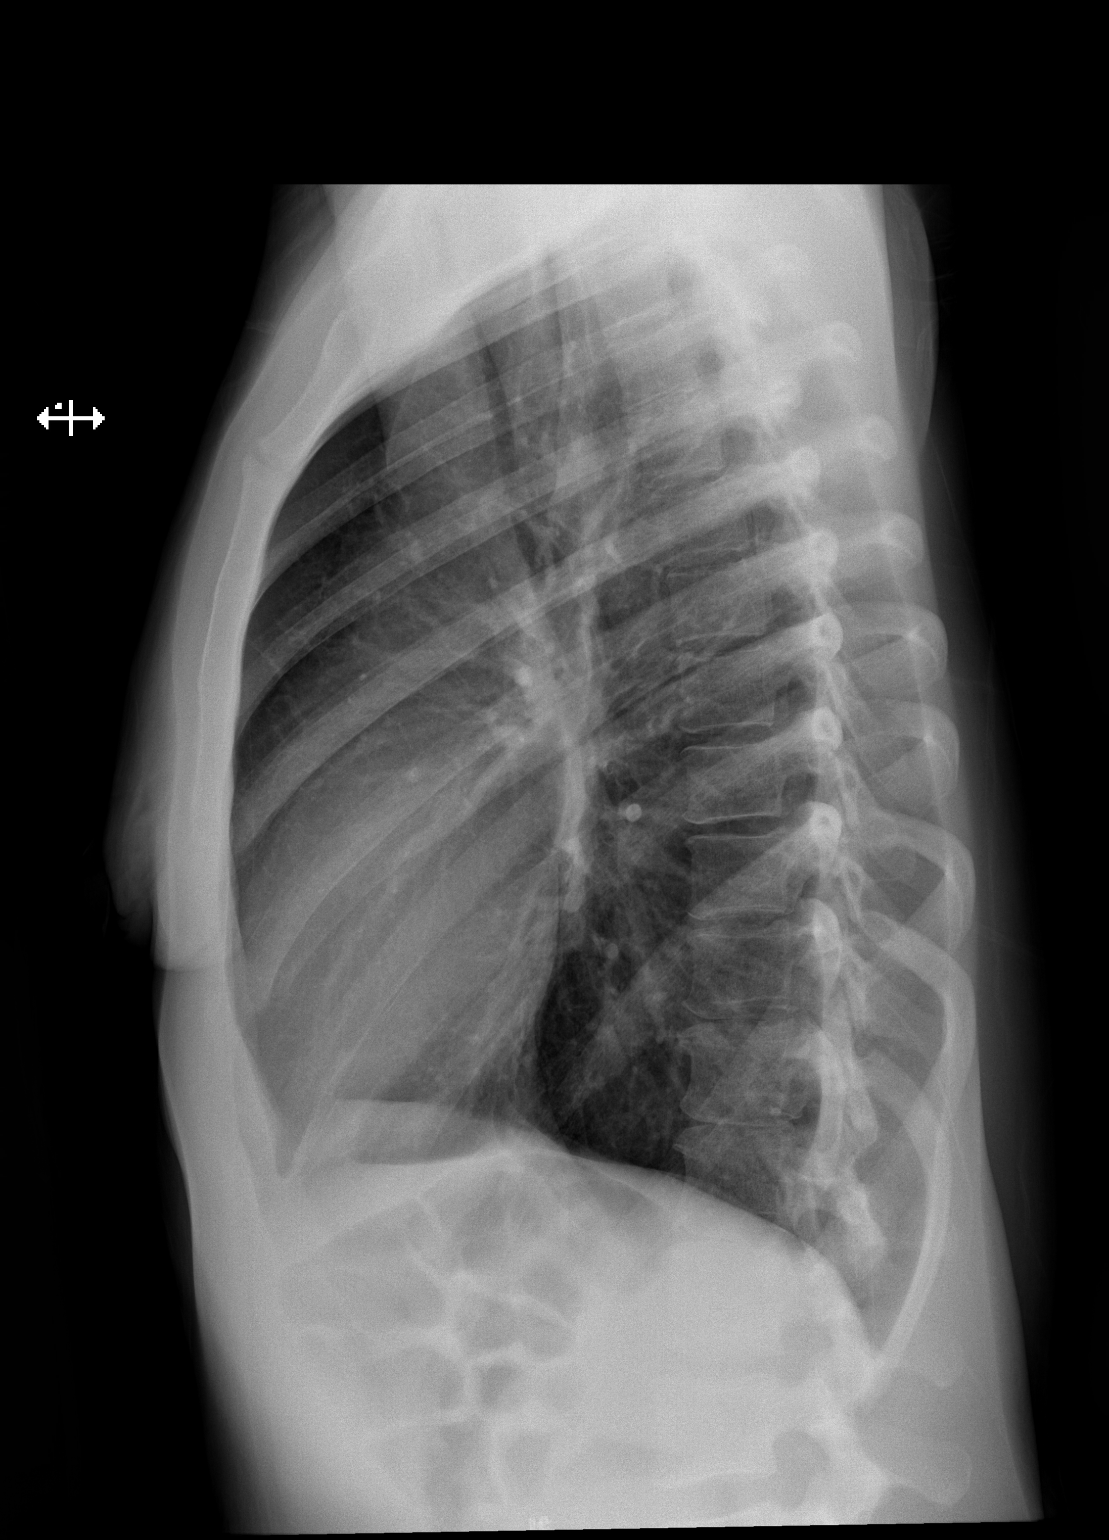

[2 of 2 positions shown; findings below may reference images not displayed]

FINDINGS: Chronic thoracolumbar scoliosis. Normal lung volumes. Normal cardiac
size and mediastinal contours. Visualized tracheal air column is
within normal limits. No pneumothorax, pulmonary edema, pleural
effusion or confluent pulmonary opacity. No pneumoperitoneum.
Negative visible bowel gas pattern. Cholecystectomy clips.
IMPRESSION: Negative, no acute cardiopulmonary abnormality.

## 2014-09-11 IMAGING — CT CT ABD-PELV W/ CM
1 of 2 series · 15 of 32 positions shown, 19 images · IV contrast (OMNIPAQUE 300)
Comparison: None.

CLINICAL DATA: Right lower quadrant pain for 3 days, recent
cholecystectomy

EXAM:
CT ABDOMEN AND PELVIS WITH CONTRAST
TECHNIQUE: Multidetector CT imaging of the abdomen and pelvis was performed
using the standard protocol following bolus administration of
intravenous contrast.
CONTRAST:  50mL OMNIPAQUE IOHEXOL 300 MG/ML SOLN, 100mL OMNIPAQUE
IOHEXOL 300 MG/ML SOLN

[Series 2: abd/pel with · axial · 0.62mm/px · z∈[-401,-51]mm · 15 of 78 slices shown, 19 images]
[im 4/78  soft-tissue]
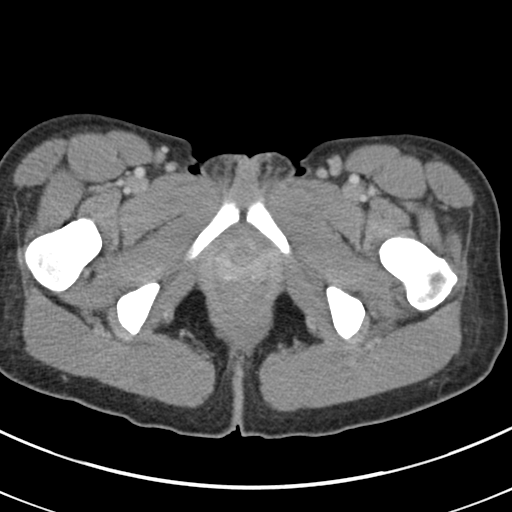
[im 4/78  bone]
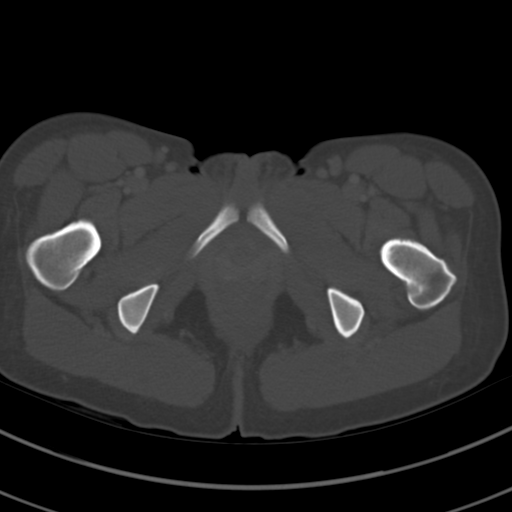
[im 10/78  soft-tissue]
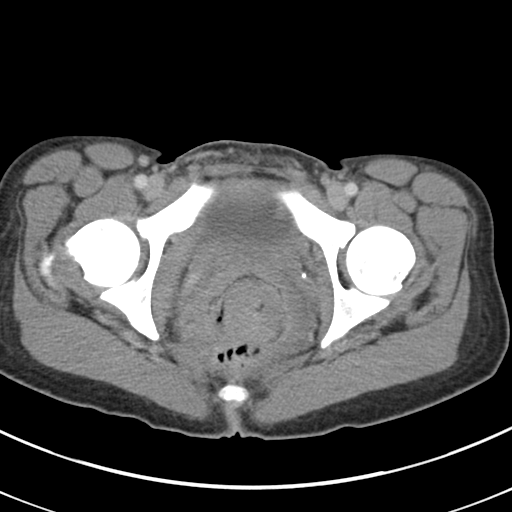
[im 17/78  soft-tissue]
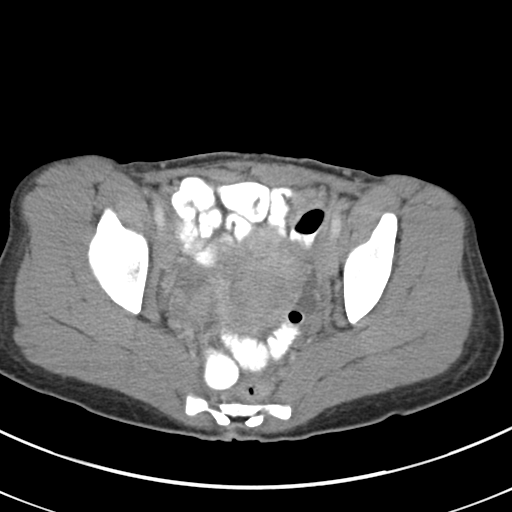
[im 23/78  soft-tissue]
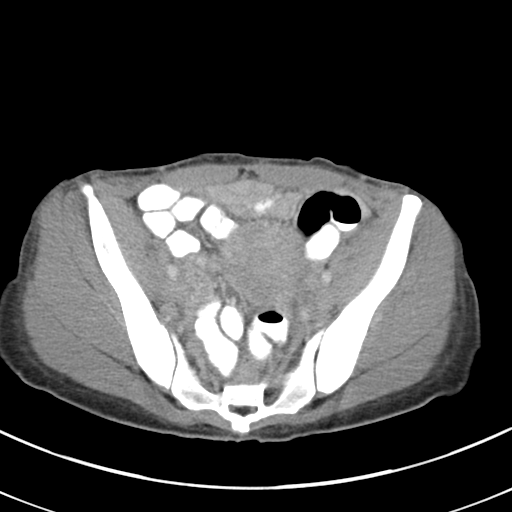
[im 26/78  soft-tissue]
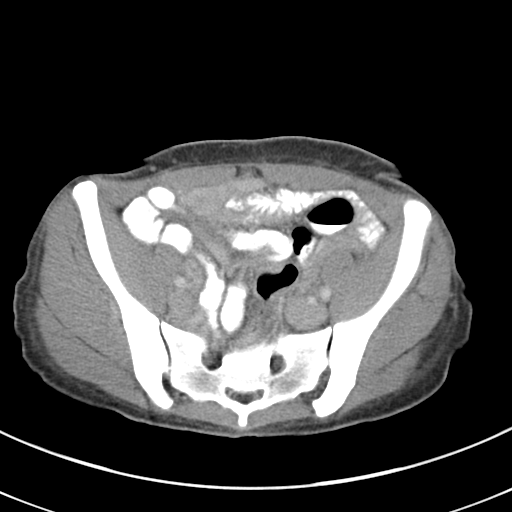
[im 33/78  soft-tissue]
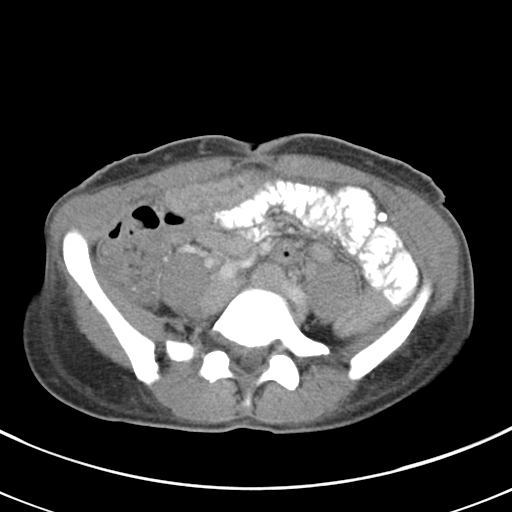
[im 39/78  soft-tissue]
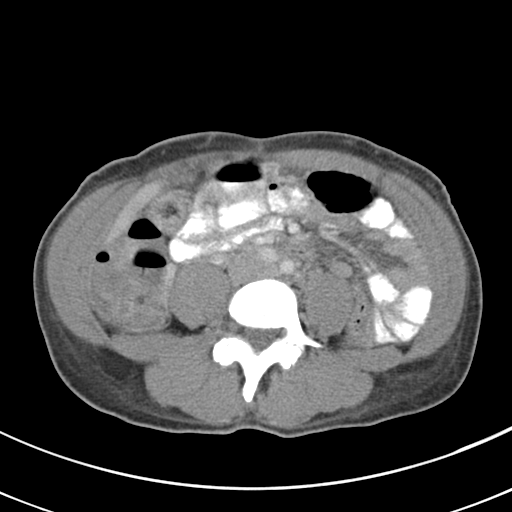
[im 45/78  soft-tissue]
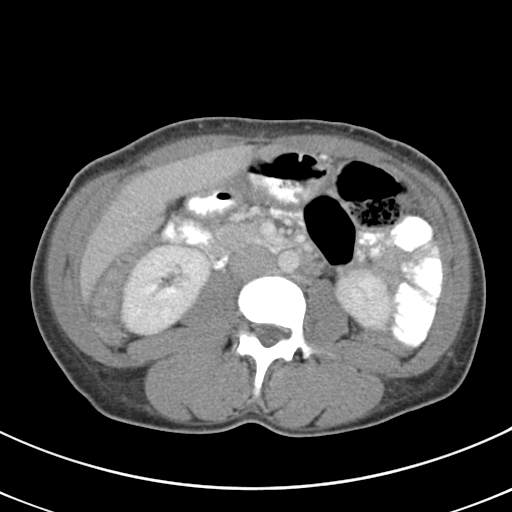
[im 52/78  soft-tissue]
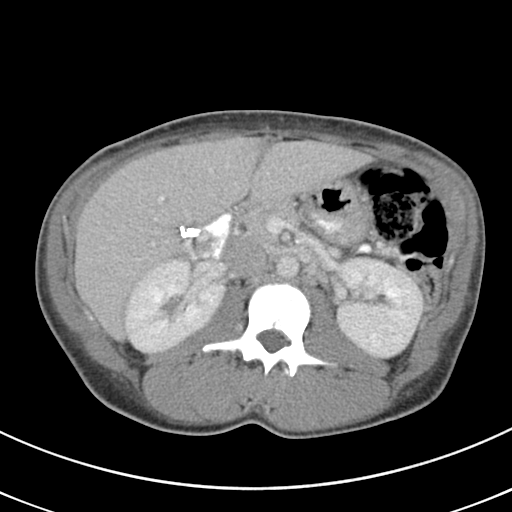
[im 52/78  bone]
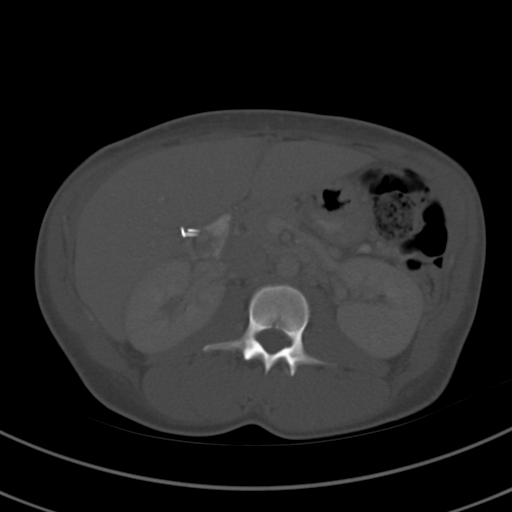
[im 55/78  soft-tissue]
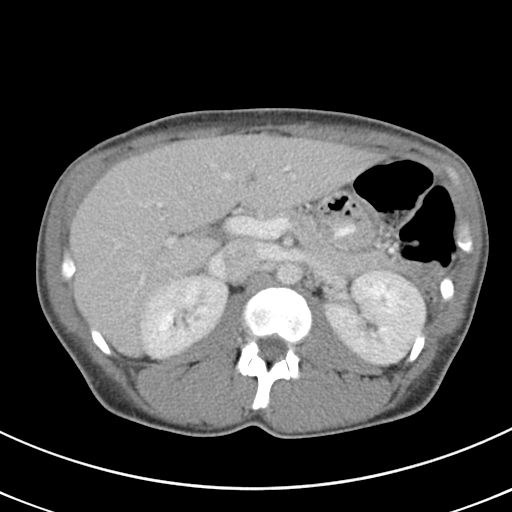
[im 61/78  soft-tissue]
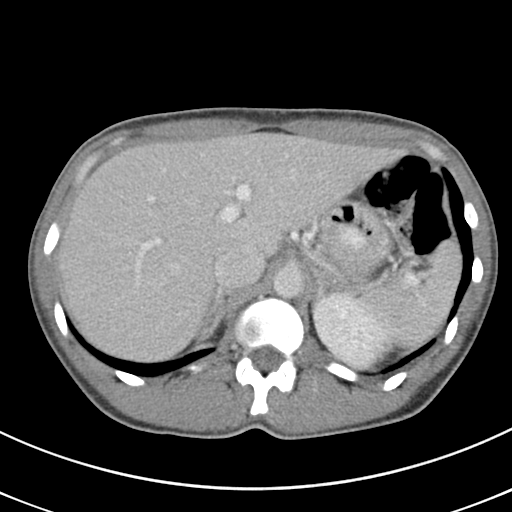
[im 65/78  lung]
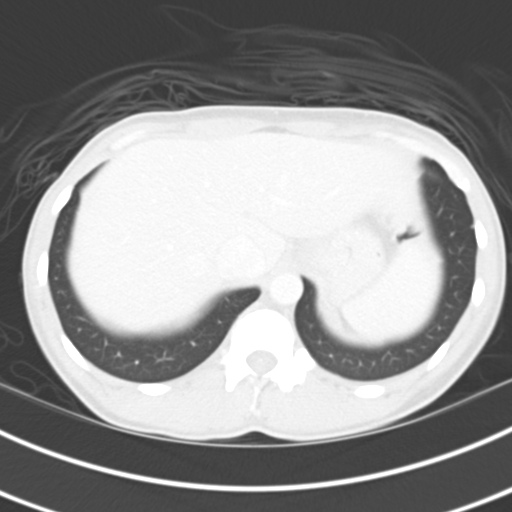
[im 68/78  soft-tissue]
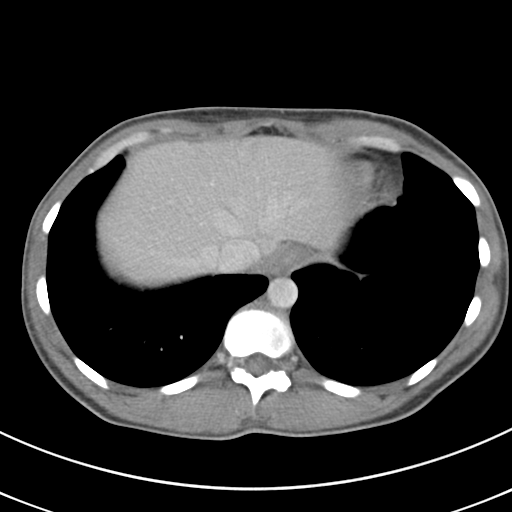
[im 68/78  lung]
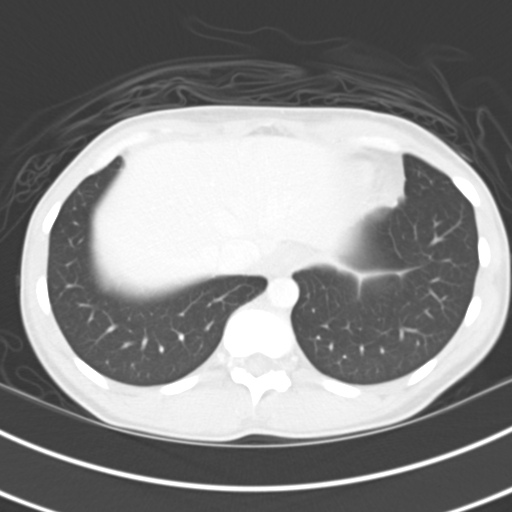
[im 71/78  lung]
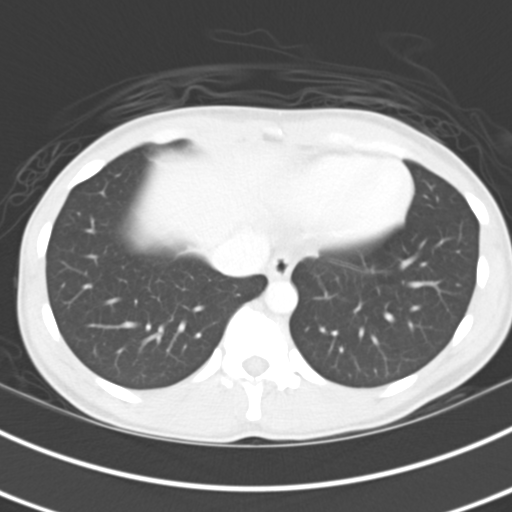
[im 74/78  soft-tissue]
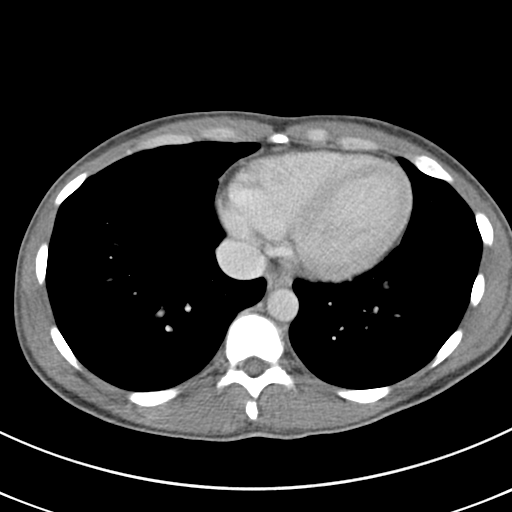
[im 74/78  lung]
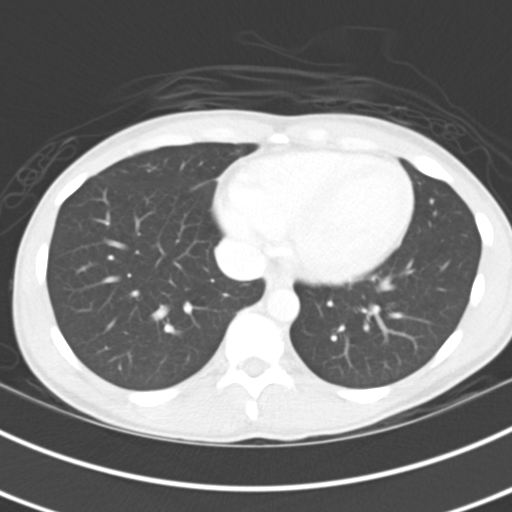

[15 of 32 positions shown; findings below may reference images not displayed]

FINDINGS: Lung bases are free of acute infiltrate or sizable effusion. The
gallbladder has been surgically removed. The liver, spleen, adrenal
glands and pancreas are within normal limits. The kidneys
demonstrate a normal enhancement pattern. No renal or ureteral
calculi are seen.

The appendix is well visualized and partially air filled. No
inflammatory changes to suggest appendicitis are noted. The uterus
is somewhat prominent and demonstrates some generalized decreased
attenuation likely related to edema. This could be related to some
degree of underlying inflammatory change. Clinical correlation is
recommended with the physical exam. A a mildly irregular right
ovarian cyst is noted which may be related to an recently ruptured
cyst. Some surrounding free fluid is noted as well. Follicular
changes are noted within the left ovary.

The bladder is well distended. The bony structures are within normal
limits.
IMPRESSION: Changes consistent with a recently ruptured cyst on the right. This
would correspond with the patient's given clinical symptomatology.
Some surrounding free fluid is noted within the pelvis.

Generalized decreased attenuation of the uterus which may be related
to some uterine edema. This is of uncertain significance but may be
related to underlying inflammatory change.

Normal-appearing appendix.

## 2014-09-11 MED ORDER — FENTANYL CITRATE 0.05 MG/ML IJ SOLN
50.0000 ug | Freq: Once | INTRAMUSCULAR | Status: AC
  Administered 2014-09-11: 50 ug via INTRAVENOUS
  Filled 2014-09-11: qty 2

## 2014-09-11 MED ORDER — ALBUTEROL SULFATE (2.5 MG/3ML) 0.083% IN NEBU
5.0000 mg | INHALATION_SOLUTION | Freq: Once | RESPIRATORY_TRACT | Status: AC
  Administered 2014-09-11: 5 mg via RESPIRATORY_TRACT
  Filled 2014-09-11: qty 6

## 2014-09-11 MED ORDER — IOHEXOL 300 MG/ML  SOLN
50.0000 mL | Freq: Once | INTRAMUSCULAR | Status: AC | PRN
  Administered 2014-09-11: 50 mL via ORAL

## 2014-09-11 MED ORDER — GI COCKTAIL ~~LOC~~
30.0000 mL | Freq: Once | ORAL | Status: AC
Start: 2014-09-11 — End: 2014-09-11
  Administered 2014-09-11: 30 mL via ORAL
  Filled 2014-09-11: qty 30

## 2014-09-11 MED ORDER — ONDANSETRON HCL 4 MG/2ML IJ SOLN
4.0000 mg | Freq: Once | INTRAMUSCULAR | Status: AC
  Administered 2014-09-11: 4 mg via INTRAVENOUS
  Filled 2014-09-11: qty 2

## 2014-09-11 MED ORDER — IOHEXOL 300 MG/ML  SOLN
100.0000 mL | Freq: Once | INTRAMUSCULAR | Status: AC | PRN
  Administered 2014-09-11: 100 mL via INTRAVENOUS

## 2014-09-11 MED ORDER — HYDROCODONE-ACETAMINOPHEN 5-325 MG PO TABS: 2.0000 | ORAL_TABLET | ORAL | Status: DC | PRN

## 2014-09-11 MED ORDER — OMEPRAZOLE 20 MG PO CPDR: 20.0000 mg | DELAYED_RELEASE_CAPSULE | Freq: Every day | ORAL | Status: DC

## 2014-09-11 MED ORDER — NAPROXEN 500 MG PO TABS: 500.0000 mg | ORAL_TABLET | Freq: Two times a day (BID) | ORAL | Status: DC

## 2014-09-11 MED ORDER — SODIUM CHLORIDE 0.9 % IV BOLUS (SEPSIS)
1000.0000 mL | Freq: Once | INTRAVENOUS | Status: AC
  Administered 2014-09-11: 1000 mL via INTRAVENOUS

## 2014-09-11 NOTE — ED Provider Notes (Signed)
CSN: [id number]     Arrival date & time 09/11/14  1138 History   First MD Initiated Contact with Patient 09/11/14 1309     Chief Complaint  Patient presents with  . Abdominal Pain   Kayla Dunn is a 34 y.o. female with a history of  GERD, pancreatitis, pelvic inflammatory disease, previous cholecystectomy and tubal ligation who presents emergency department complaining of abdominal pain, nausea, vomiting and cough associated with fevers and chills for the past 3 days. Patient reports her symptoms began 3 days ago and they're associated with body aches and suprapubic abdominal pain. Patient rates her pain at a 7 out of 10 and describes it as constant and pressure. Patient reports she vomited one time today at 4 AM has not vomited since. Patient denies any current nausea or fevers today. She reports fever over the past several days with a tmax of 101.9. Patient also reports a cough for the past 3 days associated with wheezing, nasal congestion and chest tightness. Patient has history of bronchitis.  The patient denies sick contacts, vaginal bleeding, vaginal discharge, dysuria, hematuria, urinary urgency, urinary frequency, chest pain, palpitations, or rashes.  (Consider location/radiation/quality/duration/timing/severity/associated sxs/prior Treatment) HPI  Past Medical History  Diagnosis Date  . Insomnia   . Anemia   . Bronchitis   . GERD (gastroesophageal reflux disease)   . Migraines   . Seasonal allergies   . Pancreatitis   . Heart murmur   . Hypertension     "havent been taking my medicine in awhile"  . Pregnancy induced hypertension   . Preterm labor   . Arthritis   . Depression     no meds, fine right now  . Insomnia   . PID (acute pelvic inflammatory disease)    Past Surgical History  Procedure Laterality Date  . Cesarean section    . Tubal ligation    . Ovary surgery    . Cholecystectomy N/A 08/27/2013    Procedure: LAPAROSCOPIC CHOLECYSTECTOMY WITH INTRAOPERATIVE  CHOLANGIOGRAM;  Surgeon: Valarie MerinoMatthew B Martin, MD;  Location: [facility];  Service: General;  Laterality: N/A;  . Dilation and curettage of uterus     Family History  Problem Relation Age of Onset  . Cancer Mother     colon  . Arthritis Mother   . Hypertension Father   . Liver disease Father   . Stroke Father   . Diabetes Paternal Grandmother   . Hypertension Paternal Grandmother   . Heart disease Paternal Grandmother    History  Substance Use Topics  . Smoking status: Light Tobacco Smoker -- 0.25 packs/day for 12 years  . Smokeless tobacco: Never Used  . Alcohol Use: No     Comment: socially   OB History    Gravida Para Term Preterm AB TAB SAB Ectopic Multiple Living   9 4 1 3 5  5   4      Review of Systems  Constitutional: Positive for fever and chills.  HENT: Positive for congestion. Negative for sore throat.   Eyes: Negative for visual disturbance.  Respiratory: Positive for cough, chest tightness, shortness of breath and wheezing.   Cardiovascular: Negative for chest pain, palpitations and leg swelling.  Gastrointestinal: Positive for nausea, vomiting and abdominal pain. Negative for diarrhea and blood in stool.  Genitourinary: Negative for dysuria, urgency, frequency, hematuria, flank pain, vaginal bleeding, vaginal discharge and difficulty urinating.  Musculoskeletal: Negative for back pain and neck pain.  Skin: Negative for rash.  Neurological: Negative for light-headedness and  headaches.      Allergies  Aspirin; Hydromorphone hcl; and Morphine  Home Medications   Prior to Admission medications   Medication Sig Start Date End Date Taking? Authorizing Provider  acetaminophen (TYLENOL ARTHRITIS PAIN) 650 MG CR tablet Take 1,300 mg by mouth every 8 (eight) hours as needed for pain.   Yes Historical Provider, MD  alum & mag hydroxide-simeth (MAALOX/MYLANTA) 200-200-20 MG/5ML suspension Take 15 mLs by mouth every 6 (six) hours as needed for indigestion or heartburn.  01/09/14  Yes Jennifer Piepenbrink, PA-C  cetirizine (ZYRTEC) 10 MG tablet Take 10 mg by mouth daily.   Yes Historical Provider, MD  ferrous sulfate 324 (65 FE) MG TBEC Take 1 tablet (325 mg total) by mouth daily. 03/03/14  Yes Pincus Large, DO  ibuprofen (ADVIL,MOTRIN) 200 MG tablet Take 400 mg by mouth every 6 (six) hours as needed for fever.   Yes Historical Provider, MD  nicotine (NICODERM CQ - DOSED IN MG/24 HR) 7 mg/24hr patch Place 1 patch (7 mg total) onto the skin daily. 03/03/14  Yes Pincus Large, DO  ondansetron (ZOFRAN) 4 MG tablet Take 1 tablet (4 mg total) by mouth every 8 (eight) hours as needed for nausea or vomiting. 01/30/14 01/30/15 Yes Dorathy Kinsman, CNM  ranitidine (ZANTAC) 75 MG tablet Take 75 mg by mouth 2 (two) times daily.   Yes Historical Provider, MD  HYDROcodone-acetaminophen (NORCO/VICODIN) 5-325 MG per tablet Take 2 tablets by mouth every 4 (four) hours as needed. 09/11/14   Everlene Farrier, PA-C  naproxen (NAPROSYN) 500 MG tablet Take 1 tablet (500 mg total) by mouth 2 (two) times daily with a meal. 09/11/14   Everlene Farrier, PA-C  omeprazole (PRILOSEC) 20 MG capsule Take 1 capsule (20 mg total) by mouth daily. 09/11/14   Everlene Farrier, PA-C   BP 153/88 mmHg  Pulse 77  Temp(Src) 98.4 F (36.9 C) (Oral)  Resp 16  SpO2 100%  LMP 07/28/2014 Physical Exam  Constitutional: She appears well-developed and well-nourished. No distress.  HENT:  Head: Normocephalic and atraumatic.  Mouth/Throat: Oropharynx is clear and moist. No oropharyngeal exudate.  Eyes: Conjunctivae are normal. Pupils are equal, round, and reactive to light. Right eye exhibits no discharge. Left eye exhibits no discharge.  Neck: Neck supple. No JVD present. No tracheal deviation present.  Cardiovascular: Normal rate, regular rhythm and intact distal pulses.   Pulmonary/Chest: Effort normal. No respiratory distress. She has wheezes. She has no rales. She exhibits no tenderness.  Scattered wheezes  bilaterally.   Abdominal: Soft. Bowel sounds are normal. She exhibits no distension. There is tenderness.   Abdomen is soft. Bowel sounds are present. Patient's abdomen is generally tender to palpation worse in her suprapubic area.   Genitourinary:   Pelvic exam performed by me with female nurse tech chaperone. Patient has a  Small amount of white vaginal discharge.  Cervix is closed. No vaginal bleeding. No external lesions. No cervical motion tenderness. Patient has moderate right lower quadrant and right adnexal tenderness  Musculoskeletal: She exhibits no edema.  Lymphadenopathy:    She has no cervical adenopathy.  Neurological: She is alert. Coordination normal.  Skin: Skin is warm and dry. No rash noted. She is not diaphoretic. No erythema. No pallor.  Psychiatric: She has a normal mood and affect. Her behavior is normal.  Nursing note and vitals reviewed.   ED Course  Procedures (including critical care time) Labs Review Labs Reviewed  WET PREP, GENITAL - Abnormal; Notable for the following:  Clue Cells Wet Prep HPF POC FEW (*)    WBC, Wet Prep HPF POC FEW (*)    All other components within normal limits  CBC WITH DIFFERENTIAL/PLATELET - Abnormal; Notable for the following:    RBC 3.82 (*)    Hemoglobin 10.7 (*)    HCT 34.1 (*)    Monocytes Relative 13 (*)    All other components within normal limits  COMPREHENSIVE METABOLIC PANEL - Abnormal; Notable for the following:    Total Bilirubin 0.2 (*)    All other components within normal limits  URINALYSIS, ROUTINE W REFLEX MICROSCOPIC - Abnormal; Notable for the following:    Color, Urine AMBER (*)    APPearance CLOUDY (*)    Specific Gravity, Urine 1.031 (*)    Ketones, ur 15 (*)    Protein, ur 30 (*)    All other components within normal limits  URINE MICROSCOPIC-ADD ON - Abnormal; Notable for the following:    Squamous Epithelial / LPF FEW (*)    Bacteria, UA FEW (*)    All other components within normal limits   LIPASE, BLOOD  PREGNANCY, URINE    Imaging Review Dg Chest 2 View  09/11/2014   CLINICAL DATA:  34 year old female with nausea lower abdominal pain and cough for several days. Initial encounter.  EXAM: CHEST  2 VIEW  COMPARISON:  03/10/2013 and earlier.  FINDINGS: Chronic thoracolumbar scoliosis. Normal lung volumes. Normal cardiac size and mediastinal contours. Visualized tracheal air column is within normal limits. No pneumothorax, pulmonary edema, pleural effusion or confluent pulmonary opacity. No pneumoperitoneum. Negative visible bowel gas pattern. Cholecystectomy clips.  IMPRESSION: Negative, no acute cardiopulmonary abnormality.   Electronically Signed   By: Odessa Fleming M.D.   On: 09/11/2014 14:47   Ct Abdomen Pelvis W Contrast  09/11/2014   CLINICAL DATA:  Right lower quadrant pain for 3 days, recent cholecystectomy  EXAM: CT ABDOMEN AND PELVIS WITH CONTRAST  TECHNIQUE: Multidetector CT imaging of the abdomen and pelvis was performed using the standard protocol following bolus administration of intravenous contrast.  CONTRAST:  50mL OMNIPAQUE IOHEXOL 300 MG/ML SOLN, OMNIPAQUE IOHEXOL 300 MG/ML SOLN  COMPARISON:  None.  FINDINGS: Lung bases are free of acute infiltrate or sizable effusion. The gallbladder has been surgically removed. The liver, spleen, adrenal glands and pancreas are within normal limits. The kidneys demonstrate a normal enhancement pattern. No renal or ureteral calculi are seen.  The appendix is well visualized and partially air filled. No inflammatory changes to suggest appendicitis are noted. The uterus is somewhat prominent and demonstrates some generalized decreased attenuation likely related to edema. This could be related to some degree of underlying inflammatory change. Clinical correlation is recommended with the physical exam. A a mildly irregular right ovarian cyst is noted which may be related to an recently ruptured cyst. Some surrounding free fluid is noted as  well. Follicular changes are noted within the left ovary.  The bladder is well distended. The bony structures are within normal limits.  IMPRESSION: Changes consistent with a recently ruptured cyst on the right. This would correspond with the patient's given clinical symptomatology. Some surrounding free fluid is noted within the pelvis.  Generalized decreased attenuation of the uterus which may be related to some uterine edema. This is of uncertain significance but may be related to underlying inflammatory change.  Normal-appearing appendix.   Electronically Signed   By: Alcide Clever M.D.   On: 09/11/2014 17:34     EKG Interpretation None  Filed Vitals:   09/11/14 1143 09/11/14 1451 09/11/14 1744  BP: 144/87 132/66 153/88  Pulse: 81 73 77  Temp: 98.2 F (36.8 C)  98.4 F (36.9 C)  TempSrc: Oral  Oral  Resp: 16 16 16   SpO2: 99% 97% 100%     MDM   Meds given in ED:  Medications  sodium chloride 0.9 % bolus 1,000 mL (0 mLs Intravenous Stopped 09/11/14 1741)  ondansetron (ZOFRAN) injection 4 mg (4 mg Intravenous Given 09/11/14 1359)  fentaNYL (SUBLIMAZE) injection 50 mcg (50 mcg Intravenous Given 09/11/14 1359)  albuterol (PROVENTIL) (2.5 MG/3ML) 0.083% nebulizer solution 5 mg (5 mg Nebulization Given 09/11/14 1404)  gi cocktail (Maalox,Lidocaine,Donnatal) (30 mLs Oral Given 09/11/14 1407)  iohexol (OMNIPAQUE) 300 MG/ML solution 50 mL (50 mLs Oral Contrast Given 09/11/14 1530)  iohexol (OMNIPAQUE) 300 MG/ML solution 100 mL (100 mLs Intravenous Contrast Given 09/11/14 1709)    New Prescriptions   HYDROCODONE-ACETAMINOPHEN (NORCO/VICODIN) 5-325 MG PER TABLET    Take 2 tablets by mouth every 4 (four) hours as needed.   NAPROXEN (NAPROSYN) 500 MG TABLET    Take 1 tablet (500 mg total) by mouth 2 (two) times daily with a meal.   OMEPRAZOLE (PRILOSEC) 20 MG CAPSULE    Take 1 capsule (20 mg total) by mouth daily.    Final diagnoses:  Cyst of right ovary  Non-intractable vomiting with  nausea, vomiting of unspecified type  Cough  Gastroesophageal reflux disease, esophagitis presence not specified   This is a 34 y.o. female with a history of  GERD, pancreatitis, pelvic inflammatory disease, previous cholecystectomy and tubal ligation who presents emergency department complaining of abdominal pain, nausea, vomiting and cough associated with fevers and chills for the past 3 days.   Patient is afebrile and nontoxic-appearing. The patient's lungs are clear to auscultation bilaterally. Patient has general abdominal tenderness initially. This localized to her right lower quadrant with repeat examination.  Urinalysis is negative for infection. Negative urine pregnancy test. CBC is unremarkable. CMP is unremarkable. Patient given albuterol treatment and reports feeling much better.  At 15:20 repeat abdominal exam reveals RLQ abdominal pain with rebound tenderness and positive rovsing sign. Will do CT scan to r/o appendicitis.   pelvic exam reveals a right lower quadrant /right adnexal tenderness. Wet prep shows few clue cells and few white blood cells. Patient denies any vaginal discharge or bleeding.  CT scan shows changes consistent with a recently ruptured cyst on the right side. It indicated a normal-appearing appendix.  ad reevaluation the patient reports her abdominal pain is well controlled and she is no longer nauseated. Patient does complain of lots of belching related to acid reflux. We'll write prescription for Prilosec. Patient given prescription for naproxen and Norco for breakthrough pain.  I advised her to follow-up with her OB/GYN next week. I advised the patient to follow-up with their primary care provider this week. I advised the patient to return to the emergency department with new or worsening symptoms or new concerns. The patient verbalized understanding and agreement with plan.   This patient was discussed with and evaluated by Dr. Anitra Lauth who agrees with assessment and  plan.     Everlene Farrier, PA-C 09/11/14 1752  Gwyneth Sprout, MD 09/12/14 1414

## 2014-09-11 NOTE — ED Notes (Signed)
Pt c/o nausea and lower abdominal pain and tenderness x 3 days after smelling pizza. Pt c/o cough x several days as well. Pt states she has been unable to keep food down.

## 2014-09-11 NOTE — Discharge Instructions (Signed)
Ovarian Cyst An ovarian cyst is a fluid-filled sac that forms on an ovary. The ovaries are small organs that produce eggs in women. Various types of cysts can form on the ovaries. Most are not cancerous. Many do not cause problems, and they often go away on their own. Some may cause symptoms and require treatment. Common types of ovarian cysts include:  Functional cysts--These cysts may occur every month during the menstrual cycle. This is normal. The cysts usually go away with the next menstrual cycle if the woman does not get pregnant. Usually, there are no symptoms with a functional cyst.  Endometrioma cysts--These cysts form from the tissue that lines the uterus. They are also called "chocolate cysts" because they become filled with blood that turns [place]. This type of cyst can cause pain in the lower abdomen during intercourse and with your menstrual period.  Cystadenoma cysts--This type develops from the cells on the outside of the ovary. These cysts can get very big and cause lower abdomen pain and pain with intercourse. This type of cyst can twist on itself, cut off its blood supply, and cause severe pain. It can also easily rupture and cause a lot of pain.  Dermoid cysts--This type of cyst is sometimes found in both ovaries. These cysts may contain different kinds of body tissue, such as skin, teeth, hair, or cartilage. They usually do not cause symptoms unless they get very big.  Theca lutein cysts--These cysts occur when too much of a certain hormone (human chorionic gonadotropin) is produced and overstimulates the ovaries to produce an egg. This is most common after procedures used to assist with the conception of a baby (in vitro fertilization). CAUSES   Fertility drugs can cause a condition in which multiple large cysts are formed on the ovaries. This is called ovarian hyperstimulation syndrome.  A condition called polycystic ovary syndrome can cause hormonal imbalances that can lead to  nonfunctional ovarian cysts. SIGNS AND SYMPTOMS  Many ovarian cysts do not cause symptoms. If symptoms are present, they may include:  Pelvic pain or pressure.  Pain in the lower abdomen.  Pain during sexual intercourse.  Increasing girth (swelling) of the abdomen.  Abnormal menstrual periods.  Increasing pain with menstrual periods.  Stopping having menstrual periods without being pregnant. DIAGNOSIS  These cysts are commonly found during a routine or annual pelvic exam. Tests may be ordered to find out more about the cyst. These tests may include:  Ultrasound.  X-ray of the pelvis.  CT scan.  MRI.  Blood tests. TREATMENT  Many ovarian cysts go away on their own without treatment. Your health care provider may want to check your cyst regularly for 2-3 months to see if it changes. For women in menopause, it is particularly important to monitor a cyst closely because of the higher rate of ovarian cancer in menopausal women. When treatment is needed, it may include any of the following:  A procedure to drain the cyst (aspiration). This may be done using a long needle and ultrasound. It can also be done through a laparoscopic procedure. This involves using a thin, lighted tube with a tiny camera on the end (laparoscope) inserted through a small incision.  Surgery to remove the whole cyst. This may be done using laparoscopic surgery or an open surgery involving a larger incision in the lower abdomen.  Hormone treatment or birth control pills. These methods are sometimes used to help dissolve a cyst. HOME CARE INSTRUCTIONS   Only take over-the-counter  or prescription medicines as directed by your health care provider.  Follow up with your health care provider as directed.  Get regular pelvic exams and Pap tests. SEEK MEDICAL CARE IF:   Your periods are late, irregular, or painful, or they stop.  Your pelvic pain or abdominal pain does not go away.  Your abdomen becomes  larger or swollen.  You have pressure on your bladder or trouble emptying your bladder completely.  You have pain during sexual intercourse.  You have feelings of fullness, pressure, or discomfort in your stomach.  You lose weight for no apparent reason.  You feel generally ill.  You become constipated.  You lose your appetite.  You develop acne.  You have an increase in body and facial hair.  You are gaining weight, without changing your exercise and eating habits.  You think you are pregnant. SEEK IMMEDIATE MEDICAL CARE IF:   You have increasing abdominal pain.  You feel sick to your stomach (nauseous), and you throw up (vomit).  You develop a fever that comes on suddenly.  You have abdominal pain during a bowel movement.  Your menstrual periods become heavier than usual. MAKE SURE YOU:  Understand these instructions.  Will watch your condition.  Will get help right away if you are not doing well or get worse. Document Released: 06/06/2005 Document Revised: 06/11/2013 Document Reviewed: 02/11/2013 [facility] Patient Information 2015 [place], [place]. This information is not intended to replace advice given to you by your health care provider. Make sure you discuss any questions you have with your health care provider.  Cough, Adult  A cough is a reflex that helps clear your throat and airways. It can help heal the body or may be a reaction to an irritated airway. A cough may only last 2 or 3 weeks (acute) or may last more than 8 weeks (chronic).  CAUSES Acute cough:  Viral or bacterial infections. Chronic cough:  Infections.  Allergies.  Asthma.  Post-nasal drip.  Smoking.  Heartburn or acid reflux.  Some medicines.  Chronic lung problems (COPD).  Cancer. SYMPTOMS   Cough.  Fever.  Chest pain.  Increased breathing rate.  High-pitched whistling sound when breathing (wheezing).  Colored mucus that you cough up (sputum). TREATMENT   A  bacterial cough may be treated with antibiotic medicine.  A viral cough must run its course and will not respond to antibiotics.  Your caregiver may recommend other treatments if you have a chronic cough. HOME CARE INSTRUCTIONS   Only take over-the-counter or prescription medicines for pain, discomfort, or fever as directed by your caregiver. Use cough suppressants only as directed by your caregiver.  Use a cold steam vaporizer or humidifier in your bedroom or home to help loosen secretions.  Sleep in a semi-upright position if your cough is worse at night.  Rest as needed.  Stop smoking if you smoke. SEEK IMMEDIATE MEDICAL CARE IF:   You have pus in your sputum.  Your cough starts to worsen.  You cannot control your cough with suppressants and are losing sleep.  You begin coughing up blood.  You have difficulty breathing.  You develop pain which is getting worse or is uncontrolled with medicine.  You have a fever. MAKE SURE YOU:   Understand these instructions.  Will watch your condition.  Will get help right away if you are not doing well or get worse. Document Released: 12/03/2010 Document Revised: 08/29/2011 Document Reviewed: 12/03/2010 [facility] Patient Information 2015 [place], [place]. This information  is not intended to replace advice given to you by your health care provider. Make sure you discuss any questions you have with your health care provider. Gastroesophageal Reflux Disease, Adult Gastroesophageal reflux disease (GERD) happens when acid from your stomach flows up into the esophagus. When acid comes in contact with the esophagus, the acid causes soreness (inflammation) in the esophagus. Over time, GERD may create small holes (ulcers) in the lining of the esophagus. CAUSES   Increased body weight. This puts pressure on the stomach, making acid rise from the stomach into the esophagus.  Smoking. This increases acid production in the stomach.  Drinking  alcohol. This causes decreased pressure in the lower esophageal sphincter (valve or ring of muscle between the esophagus and stomach), allowing acid from the stomach into the esophagus.  Late evening meals and a full stomach. This increases pressure and acid production in the stomach.  A malformed lower esophageal sphincter. Sometimes, no cause is found. SYMPTOMS   Burning pain in the lower part of the mid-chest behind the breastbone and in the mid-stomach area. This may occur twice a week or more often.  Trouble swallowing.  Sore throat.  Dry cough.  Asthma-like symptoms including chest tightness, shortness of breath, or wheezing. DIAGNOSIS  Your caregiver may be able to diagnose GERD based on your symptoms. In some cases, X-rays and other tests may be done to check for complications or to check the condition of your stomach and esophagus. TREATMENT  Your caregiver may recommend over-the-counter or prescription medicines to help decrease acid production. Ask your caregiver before starting or adding any new medicines.  HOME CARE INSTRUCTIONS   Change the factors that you can control. Ask your caregiver for guidance concerning weight loss, quitting smoking, and alcohol consumption.  Avoid foods and drinks that make your symptoms worse, such as:  Caffeine or alcoholic drinks.  Chocolate.  Peppermint or mint flavorings.  Garlic and onions.  Spicy foods.  Citrus fruits, such as oranges, lemons, or limes.  Tomato-based foods such as sauce, chili, salsa, and pizza.  Fried and fatty foods.  Avoid lying down for the 3 hours prior to your bedtime or prior to taking a nap.  Eat small, frequent meals instead of large meals.  Wear loose-fitting clothing. Do not wear anything tight around your waist that causes pressure on your stomach.  Raise the head of your bed 6 to 8 inches with wood blocks to help you sleep. Extra pillows will not help.  Only take over-the-counter or  prescription medicines for pain, discomfort, or fever as directed by your caregiver.  Do not take aspirin, ibuprofen, or other nonsteroidal anti-inflammatory drugs (NSAIDs). SEEK IMMEDIATE MEDICAL CARE IF:   You have pain in your arms, neck, jaw, teeth, or back.  Your pain increases or changes in intensity or duration.  You develop nausea, vomiting, or sweating (diaphoresis).  You develop shortness of breath, or you faint.  Your vomit is green, yellow, black, or looks like coffee grounds or blood.  Your stool is red, bloody, or black. These symptoms could be signs of other problems, such as heart disease, gastric bleeding, or esophageal bleeding. MAKE SURE YOU:   Understand these instructions.  Will watch your condition.  Will get help right away if you are not doing well or get worse. Document Released: 03/16/2005 Document Revised: 08/29/2011 Document Reviewed: 12/24/2010 [facility] Patient Information 2015 [place], [place]. This information is not intended to replace advice given to you by your health care provider. Make  sure you discuss any questions you have with your health care provider.

## 2015-04-19 ENCOUNTER — Encounter (HOSPITAL_COMMUNITY): Payer: Self-pay

## 2015-04-19 ENCOUNTER — Emergency Department (HOSPITAL_COMMUNITY)
Admission: EM | Admit: 2015-04-19 | Discharge: 2015-04-19 | Disposition: A | Discharge status: Home / Self Care | Payer: Self-pay | Attending: Emergency Medicine | Admitting: Emergency Medicine

## 2015-04-19 DIAGNOSIS — Z720 Tobacco use: Secondary | ICD-10-CM | POA: Insufficient documentation

## 2015-04-19 DIAGNOSIS — Z8709 Personal history of other diseases of the respiratory system: Secondary | ICD-10-CM | POA: Insufficient documentation

## 2015-04-19 DIAGNOSIS — I10 Essential (primary) hypertension: Secondary | ICD-10-CM | POA: Insufficient documentation

## 2015-04-19 DIAGNOSIS — R1084 Generalized abdominal pain: Secondary | ICD-10-CM | POA: Insufficient documentation

## 2015-04-19 DIAGNOSIS — Z79899 Other long term (current) drug therapy: Secondary | ICD-10-CM | POA: Insufficient documentation

## 2015-04-19 DIAGNOSIS — R011 Cardiac murmur, unspecified: Secondary | ICD-10-CM | POA: Insufficient documentation

## 2015-04-19 DIAGNOSIS — R197 Diarrhea, unspecified: Secondary | ICD-10-CM | POA: Insufficient documentation

## 2015-04-19 DIAGNOSIS — G4700 Insomnia, unspecified: Secondary | ICD-10-CM | POA: Insufficient documentation

## 2015-04-19 DIAGNOSIS — M1990 Unspecified osteoarthritis, unspecified site: Secondary | ICD-10-CM | POA: Insufficient documentation

## 2015-04-19 DIAGNOSIS — D649 Anemia, unspecified: Secondary | ICD-10-CM | POA: Insufficient documentation

## 2015-04-19 DIAGNOSIS — R112 Nausea with vomiting, unspecified: Secondary | ICD-10-CM | POA: Insufficient documentation

## 2015-04-19 DIAGNOSIS — K219 Gastro-esophageal reflux disease without esophagitis: Secondary | ICD-10-CM | POA: Insufficient documentation

## 2015-04-19 DIAGNOSIS — Z3202 Encounter for pregnancy test, result negative: Secondary | ICD-10-CM | POA: Insufficient documentation

## 2015-04-19 HISTORY — DX: Menopausal and female climacteric states: N95.1

## 2015-04-19 LAB — CBC
HCT: 32.7 % — ABNORMAL LOW (ref 36.0–46.0)
Hemoglobin: 9.9 g/dL — ABNORMAL LOW (ref 12.0–15.0)
MCH: 23.6 pg — ABNORMAL LOW (ref 26.0–34.0)
MCHC: 30.3 g/dL (ref 30.0–36.0)
MCV: 77.9 fL — ABNORMAL LOW (ref 78.0–100.0)
Platelets: 347 10*3/uL (ref 150–400)
RBC: 4.2 MIL/uL (ref 3.87–5.11)
RDW: 16.2 % — AB (ref 11.5–15.5)
WBC: 4.8 10*3/uL (ref 4.0–10.5)

## 2015-04-19 LAB — COMPREHENSIVE METABOLIC PANEL
ALK PHOS: 74 U/L (ref 38–126)
ALT: 17 U/L (ref 14–54)
AST: 26 U/L (ref 15–41)
Albumin: 4.1 g/dL (ref 3.5–5.0)
Anion gap: 8 (ref 5–15)
BILIRUBIN TOTAL: 0.3 mg/dL (ref 0.3–1.2)
BUN: 10 mg/dL (ref 6–20)
CALCIUM: 8.9 mg/dL (ref 8.9–10.3)
CO2: 26 mmol/L (ref 22–32)
CREATININE: 0.72 mg/dL (ref 0.44–1.00)
Chloride: 105 mmol/L (ref 101–111)
GFR calc Af Amer: >60 mL/min (ref >60)
GFR calc non Af Amer: >60 mL/min (ref >60)
GLUCOSE: 88 mg/dL (ref 65–99)
Potassium: 3.5 mmol/L (ref 3.5–5.1)
Sodium: 139 mmol/L (ref 135–145)
TOTAL PROTEIN: 7.7 g/dL (ref 6.5–8.1)

## 2015-04-19 LAB — URINALYSIS, ROUTINE W REFLEX MICROSCOPIC
Glucose, UA: NEGATIVE mg/dL
HGB URINE DIPSTICK: NEGATIVE
Ketones, ur: NEGATIVE mg/dL
Leukocytes, UA: NEGATIVE
Nitrite: NEGATIVE
PH: 6 (ref 5.0–8.0)
Protein, ur: NEGATIVE mg/dL
SPECIFIC GRAVITY, URINE: 1.02 (ref 1.005–1.030)
Urobilinogen, UA: 1 mg/dL (ref 0.0–1.0)

## 2015-04-19 LAB — WET PREP, GENITAL
Trich, Wet Prep: NONE SEEN
Yeast Wet Prep HPF POC: NONE SEEN

## 2015-04-19 LAB — I-STAT BETA HCG BLOOD, ED (MC, WL, AP ONLY): I-stat hCG, quantitative: <5 m[IU]/mL (ref <5)

## 2015-04-19 LAB — LIPASE, BLOOD: Lipase: 27 U/L (ref 11–51)

## 2015-04-19 MED ORDER — ONDANSETRON HCL 4 MG/2ML IJ SOLN: 4.0000 mg | Freq: Once | INTRAMUSCULAR | Status: DC

## 2015-04-19 MED ORDER — DICYCLOMINE HCL 10 MG PO CAPS
10.0000 mg | ORAL_CAPSULE | Freq: Once | ORAL | Status: AC
  Administered 2015-04-19: 10 mg via ORAL
  Filled 2015-04-19: qty 1

## 2015-04-19 MED ORDER — OXYCODONE-ACETAMINOPHEN 5-325 MG PO TABS
1.0000 | ORAL_TABLET | Freq: Once | ORAL | Status: AC
  Administered 2015-04-19: 1 via ORAL
  Filled 2015-04-19: qty 1

## 2015-04-19 MED ORDER — ONDANSETRON 4 MG PO TBDP
4.0000 mg | ORAL_TABLET | Freq: Once | ORAL | Status: AC
  Administered 2015-04-19: 4 mg via ORAL
  Filled 2015-04-19: qty 1

## 2015-04-19 MED ORDER — DICYCLOMINE HCL 20 MG PO TABS: 20.0000 mg | ORAL_TABLET | Freq: Two times a day (BID) | ORAL | Status: DC

## 2015-04-19 MED ORDER — HYDROCODONE-ACETAMINOPHEN 5-325 MG PO TABS: 1.0000 | ORAL_TABLET | ORAL | Status: DC | PRN

## 2015-04-19 MED ORDER — ONDANSETRON HCL 4 MG PO TABS: 4.0000 mg | ORAL_TABLET | Freq: Four times a day (QID) | ORAL | Status: DC

## 2015-04-19 MED ORDER — SODIUM CHLORIDE 0.9 % IV BOLUS (SEPSIS): 1000.0000 mL | Freq: Once | INTRAVENOUS | Status: DC

## 2015-04-19 NOTE — ED Provider Notes (Signed)
CSN: [id number]     Arrival date & time 04/19/15  1634 History   First MD Initiated Contact with Patient 04/19/15 1917     Chief Complaint  Patient presents with  . Abdominal Pain  . Emesis     (Consider location/radiation/quality/duration/timing/severity/associated sxs/prior Treatment) HPI  Patient presents to the ER with complaints of abdominal pain, vomiting, diarrhea that started yesterday. She reports eating and then one hour later she developed vomiting and diarrhea with diffuse abdominal pain with cramping. She has not had dysuria, vaginal discharge or vaginal bleeding. She was at work today and after eating greasy food developed abdominal cramping and had two episodes of vomiting. She no longer has a gallbladder. She last vomited at 4 pm today and has been 5 hours without vomiting. Her pain is mild, diffuse and characterized as cramping before or after diarrhea or vomiting. She has not had fever. Denies cough  Although I notice that the triage note notes that she has had coughing, she denies. Pt overall well appearing.     Past Medical History  Diagnosis Date  . Insomnia   . Anemia   . Bronchitis   . GERD (gastroesophageal reflux disease)   . Migraines   . Seasonal allergies   . Pancreatitis   . Heart murmur   . Hypertension     "havent been taking my medicine in awhile"  . Pregnancy induced hypertension   . Preterm labor   . Arthritis   . Depression     no meds, fine right now  . Insomnia   . PID (acute pelvic inflammatory disease)   . Perimenopausal    Past Surgical History  Procedure Laterality Date  . Cesarean section    . Tubal ligation    . Ovary surgery    . Cholecystectomy N/A 08/27/2013    Procedure: LAPAROSCOPIC CHOLECYSTECTOMY WITH INTRAOPERATIVE CHOLANGIOGRAM;  Surgeon: Valarie MerinoMatthew B Martin, MD;  Location: [facility];  Service: General;  Laterality: N/A;  . Dilation and curettage of uterus     Family History  Problem Relation Age of Onset  . Cancer Mother      colon  . Arthritis Mother   . Hypertension Father   . Liver disease Father   . Stroke Father   . Diabetes Paternal Grandmother   . Hypertension Paternal Grandmother   . Heart disease Paternal Grandmother    Social History  Substance Use Topics  . Smoking status: Light Tobacco Smoker -- 0.25 packs/day for 12 years  . Smokeless tobacco: Never Used  . Alcohol Use: No   OB History    Gravida Para Term Preterm AB TAB SAB Ectopic Multiple Living   9 4 1 3 5  5   4      Review of Systems 10 Systems reviewed and are negative for acute change except as noted in the HPI.      Allergies  Aspirin; Hydromorphone hcl; and Morphine  Home Medications   Prior to Admission medications   Medication Sig Start Date End Date Taking? Authorizing Provider  alum & mag hydroxide-simeth (MAALOX/MYLANTA) 200-200-20 MG/5ML suspension Take 15 mLs by mouth every 6 (six) hours as needed for indigestion or heartburn. 01/09/14  Yes Jennifer Piepenbrink, PA-C  diphenhydrAMINE (BENADRYL) 25 MG tablet Take 25 mg by mouth every 6 (six) hours as needed for allergies.   Yes Historical Provider, MD  ibuprofen (ADVIL,MOTRIN) 200 MG tablet Take 400 mg by mouth every 6 (six) hours as needed for fever.   Yes Historical Provider,  MD  omeprazole (PRILOSEC) 20 MG capsule Take 1 capsule (20 mg total) by mouth daily. 09/11/14  Yes Everlene Farrier, PA-C  dicyclomine (BENTYL) 20 MG tablet Take 1 tablet (20 mg total) by mouth 2 (two) times daily. 04/19/15   Marlon Pel, PA-C  ferrous sulfate 324 (65 FE) MG TBEC Take 1 tablet (325 mg total) by mouth daily. Patient not taking: Reported on 04/19/2015 03/03/14   Pincus Large, DO  HYDROcodone-acetaminophen (NORCO/VICODIN) 5-325 MG per tablet Take 2 tablets by mouth every 4 (four) hours as needed. Patient not taking: Reported on 04/19/2015 09/11/14   Everlene Farrier, PA-C  HYDROcodone-acetaminophen (NORCO/VICODIN) 5-325 MG tablet Take 1 tablet by mouth every 4 (four) hours as  needed. 04/19/15   Marlon Pel, PA-C  naproxen (NAPROSYN) 500 MG tablet Take 1 tablet (500 mg total) by mouth 2 (two) times daily with a meal. Patient not taking: Reported on 04/19/2015 09/11/14   Everlene Farrier, PA-C  nicotine (NICODERM CQ - DOSED IN MG/24 HR) 7 mg/24hr patch Place 1 patch (7 mg total) onto the skin daily. Patient not taking: Reported on 04/19/2015 03/03/14   Pincus Large, DO  ondansetron (ZOFRAN) 4 MG tablet Take 1 tablet (4 mg total) by mouth every 6 (six) hours. 04/19/15   Santita Hunsberger Neva Seat, PA-C   BP 137/80 mmHg  Pulse 83  Temp(Src) 98.3 F (36.8 C) (Oral)  Resp 18  Ht  (1.549 m)  Wt 120 lb (54.432 kg)  BMI 22.69 kg/m2  SpO2 100% Physical Exam  Constitutional: She appears well-developed and well-nourished. No distress.  HENT:  Head: Normocephalic and atraumatic.  Eyes: Pupils are equal, round, and reactive to light.  Neck: Normal range of motion. Neck supple.  Cardiovascular: Normal rate and regular rhythm.   Pulmonary/Chest: Effort normal.  Abdominal: Soft. Bowel sounds are normal. She exhibits no distension. There is no hepatosplenomegaly. There is no tenderness. There is no rigidity, no rebound, no guarding and no CVA tenderness. No hernia.  Genitourinary: Vagina normal and uterus normal. Cervix exhibits no motion tenderness. Right adnexum displays no mass and no tenderness. Left adnexum displays no mass and no tenderness.  Neurological: She is alert.  Skin: Skin is warm and dry.  Nursing note and vitals reviewed.   ED Course  Procedures (including critical care time) Labs Review Labs Reviewed  WET PREP, GENITAL - Abnormal; Notable for the following:    Clue Cells Wet Prep HPF POC FEW (*)    WBC, Wet Prep HPF POC FEW (*)    All other components within normal limits  CBC - Abnormal; Notable for the following:    Hemoglobin 9.9 (*)    HCT 32.7 (*)    MCV 77.9 (*)    MCH 23.6 (*)    RDW 16.2 (*)    All other components within normal limits   URINALYSIS, ROUTINE W REFLEX MICROSCOPIC (NOT AT [facility]) - Abnormal; Notable for the following:    Color, Urine AMBER (*)    Bilirubin Urine SMALL (*)    All other components within normal limits  LIPASE, BLOOD  COMPREHENSIVE METABOLIC PANEL  I-STAT BETA HCG BLOOD, ED ([facility], [facility], [facility] ONLY)  GC/CHLAMYDIA PROBE AMP ([place]) NOT AT [facility]    Imaging Review No results found. I have personally reviewed and evaluated these images and lab results as part of my medical decision-making.   EKG Interpretation None      MDM   Final diagnoses:  Generalized abdominal pain  Nausea vomiting and  diarrhea   Wet prep not significant for infection, pt has been with same partner for 7 years and has had no GU symptoms.  GC pending UA negative, hcg negative  Lipase, CMP and CBC are largely unremarkable. Hemoglobin is 9.9, which is 1 unit less than her normal. She is perimenopausal but endorses recent vaginal bleeding.  Pt is not having any abdominal tenderness on exam, normal labs. I do not feel that imaging is necessary at this time. Given strict return to ED precautions. Rx: bentyl, vicodin and zofran  Medications  ondansetron (ZOFRAN-ODT) disintegrating tablet 4 mg (4 mg Oral Given 04/19/15 2035)  dicyclomine (BENTYL) capsule 10 mg (10 mg Oral Given 04/19/15 2035)  oxyCODONE-acetaminophen (PERCOCET/ROXICET) 5-325 MG per tablet 1 tablet (1 tablet Oral Given 04/19/15 2035)    34 y.o.Kayla Dunn's medical screening exam was performed and I feel the patient has had an appropriate workup for their chief complaint at this time and likelihood of emergent condition existing is low. They have been counseled on decision, discharge, follow up and which symptoms necessitate immediate return to the emergency department. They or their family verbally stated understanding and agreement with plan and discharged in stable condition.   Vital signs are stable at discharge. Filed Vitals:   04/19/15 1806  BP:  137/80  Pulse: 83  Temp: 98.3 F (36.8 C)  Resp: [address], PA-C 04/19/15 2140  Margarita Grizzle, MD 04/19/15 [phone]

## 2015-04-19 NOTE — ED Notes (Signed)
Patient c/o mid and lower abdominal pain and vomiting, diarrhea, coughing and chills since yesterday.

## 2015-04-19 NOTE — Discharge Instructions (Signed)

## 2015-04-20 LAB — GC/CHLAMYDIA PROBE AMP (~~LOC~~) NOT AT ARMC
Chlamydia: NEGATIVE
Neisseria Gonorrhea: POSITIVE — AB

## 2015-04-21 ENCOUNTER — Telehealth (HOSPITAL_BASED_OUTPATIENT_CLINIC_OR_DEPARTMENT_OTHER): Payer: Self-pay | Admitting: Emergency Medicine

## 2015-04-21 ENCOUNTER — Other Ambulatory Visit: Payer: Self-pay | Admitting: Obstetrics and Gynecology

## 2015-04-21 ENCOUNTER — Encounter: Payer: Self-pay | Admitting: Obstetrics and Gynecology

## 2015-04-21 NOTE — Telephone Encounter (Signed)
Chart handoff to EDP 04/21/15 for gonorrhea

## 2015-04-21 NOTE — Telephone Encounter (Signed)
Will forward to PCP for MyChart question. Looks like pt went to ED on 04/19/15 for abdominal pain and had wet prep and UA done. Please advise. Shayna Eblen, CMA.

## 2015-04-22 ENCOUNTER — Telehealth (HOSPITAL_COMMUNITY): Payer: Self-pay

## 2015-04-22 NOTE — Telephone Encounter (Signed)
Chart reviewed by Dr Linwood DibblesJon Knapp 04/22/15"250 mg ceftriaxone IM, 1 gram Azithromycin po"  04/22/15 @ 12:39 Spoke w/pt.  She stated she is aware of results and has an appt 04/23/15 for treatment.  [facility] faxed 04/22/15.

## 2015-07-10 ENCOUNTER — Encounter (HOSPITAL_COMMUNITY): Payer: Self-pay | Admitting: *Deleted

## 2015-07-10 ENCOUNTER — Emergency Department (HOSPITAL_COMMUNITY)
Admission: EM | Admit: 2015-07-10 | Discharge: 2015-07-10 | Disposition: A | Discharge status: Home / Self Care | Payer: Self-pay | Attending: Emergency Medicine | Admitting: Emergency Medicine

## 2015-07-10 DIAGNOSIS — Z9851 Tubal ligation status: Secondary | ICD-10-CM | POA: Insufficient documentation

## 2015-07-10 DIAGNOSIS — Z8719 Personal history of other diseases of the digestive system: Secondary | ICD-10-CM | POA: Insufficient documentation

## 2015-07-10 DIAGNOSIS — Z8669 Personal history of other diseases of the nervous system and sense organs: Secondary | ICD-10-CM | POA: Insufficient documentation

## 2015-07-10 DIAGNOSIS — Z862 Personal history of diseases of the blood and blood-forming organs and certain disorders involving the immune mechanism: Secondary | ICD-10-CM | POA: Insufficient documentation

## 2015-07-10 DIAGNOSIS — F17200 Nicotine dependence, unspecified, uncomplicated: Secondary | ICD-10-CM | POA: Insufficient documentation

## 2015-07-10 DIAGNOSIS — Z9889 Other specified postprocedural states: Secondary | ICD-10-CM | POA: Insufficient documentation

## 2015-07-10 DIAGNOSIS — I10 Essential (primary) hypertension: Secondary | ICD-10-CM | POA: Insufficient documentation

## 2015-07-10 DIAGNOSIS — L0291 Cutaneous abscess, unspecified: Secondary | ICD-10-CM

## 2015-07-10 DIAGNOSIS — R011 Cardiac murmur, unspecified: Secondary | ICD-10-CM | POA: Insufficient documentation

## 2015-07-10 DIAGNOSIS — N764 Abscess of vulva: Secondary | ICD-10-CM | POA: Insufficient documentation

## 2015-07-10 DIAGNOSIS — M1990 Unspecified osteoarthritis, unspecified site: Secondary | ICD-10-CM | POA: Insufficient documentation

## 2015-07-10 DIAGNOSIS — Z9049 Acquired absence of other specified parts of digestive tract: Secondary | ICD-10-CM | POA: Insufficient documentation

## 2015-07-10 DIAGNOSIS — Z8751 Personal history of pre-term labor: Secondary | ICD-10-CM | POA: Insufficient documentation

## 2015-07-10 MED ORDER — LIDOCAINE HCL 2 % IJ SOLN
10.0000 mL | Freq: Once | INTRAMUSCULAR | Status: DC
  Filled 2015-07-10: qty 20

## 2015-07-10 MED ORDER — SULFAMETHOXAZOLE-TRIMETHOPRIM 800-160 MG PO TABS: 1.0000 | ORAL_TABLET | Freq: Two times a day (BID) | ORAL | Status: DC

## 2015-07-10 NOTE — ED Notes (Signed)
Pelvic cart set up at bedside  

## 2015-07-10 NOTE — Discharge Instructions (Signed)
Abscess °An abscess is an infected area that contains a collection of pus and debris. It can occur in almost any part of the body. An abscess is also known as a furuncle or boil. °CAUSES  °An abscess occurs when tissue gets infected. This can occur from blockage of oil or sweat glands, infection of hair follicles, or a minor injury to the skin. As the body tries to fight the infection, pus collects in the area and creates pressure under the skin. This pressure causes pain. People with weakened immune systems have difficulty fighting infections and get certain abscesses more often.  °SYMPTOMS °Usually an abscess develops on the skin and becomes a painful mass that is red, warm, and tender. If the abscess forms under the skin, you may feel a moveable soft area under the skin. Some abscesses break open (rupture) on their own, but most will continue to get worse without care. The infection can spread deeper into the body and eventually into the bloodstream, causing you to feel ill.  °DIAGNOSIS  °Your caregiver will take your medical history and perform a physical exam. A sample of fluid may also be taken from the abscess to determine what is causing your infection. °TREATMENT  °Your caregiver may prescribe antibiotic medicines to fight the infection. However, taking antibiotics alone usually does not cure an abscess. Your caregiver may need to make a small cut (incision) in the abscess to drain the pus. In some cases, gauze is packed into the abscess to reduce pain and to continue draining the area. °HOME CARE INSTRUCTIONS  °· Only take over-the-counter or prescription medicines for pain, discomfort, or fever as directed by your caregiver. °· If you were prescribed antibiotics, take them as directed. Finish them even if you start to feel better. °· If gauze is used, follow your caregiver's directions for changing the gauze. °· To avoid spreading the infection: °· Keep your draining abscess covered with a  bandage. °· Wash your hands well. °· Do not share personal care items, towels, or whirlpools with others. °· Avoid skin contact with others. °· Keep your skin and clothes clean around the abscess. °· Keep all follow-up appointments as directed by your caregiver. °SEEK MEDICAL CARE IF:  °· You have increased pain, swelling, redness, fluid drainage, or bleeding. °· You have muscle aches, chills, or a general ill feeling. °· You have a fever. °MAKE SURE YOU:  °· Understand these instructions. °· Will watch your condition. °· Will get help right away if you are not doing well or get worse. °  °This information is not intended to replace advice given to you by your health care provider. Make sure you discuss any questions you have with your health care provider. °  °Document Released: 03/16/2005 Document Revised: 12/06/2011 Document Reviewed: 08/19/2011 °Elsevier Interactive Patient Education ©2016 [employer] ° °Incision and Drainage °Incision and drainage is a procedure in which a sac-like structure (cystic structure) is opened and drained. The area to be drained usually contains material such as pus, fluid, or blood.  °LET YOUR CAREGIVER KNOW ABOUT:  °· Allergies to medicine. °· Medicines taken, including vitamins, herbs, eyedrops, over-the-counter medicines, and creams. °· Use of steroids (by mouth or creams). °· Previous problems with anesthetics or numbing medicines. °· History of bleeding problems or blood clots. °· Previous surgery. °· Other health problems, including diabetes and kidney problems. °· Possibility of pregnancy, if this applies. °RISKS AND COMPLICATIONS °· Pain. °· Bleeding. °· Scarring. °· Infection. °BEFORE THE PROCEDURE  °  You may need to have an ultrasound or other imaging tests to see how large or deep your cystic structure is. Blood tests may also be used to determine if you have an infection or how severe the infection is. You may need to have a tetanus shot. °PROCEDURE  °The affected area  is cleaned with a cleaning fluid. The cyst area will then be numbed with a medicine (local anesthetic). A small incision will be made in the cystic structure. A syringe or catheter may be used to drain the contents of the cystic structure, or the contents may be squeezed out. The area will then be flushed with a cleansing solution. After cleansing the area, it is often gently packed with a gauze or another wound dressing. Once it is packed, it will be covered with gauze and tape or some other type of wound dressing.  °AFTER THE PROCEDURE  °· Often, you will be allowed to go home right after the procedure. °· You may be given antibiotic medicine to prevent or heal an infection. °· If the area was packed with gauze or some other wound dressing, you will likely need to come back in 1 to 2 days to get it removed. °· The area should heal in about 14 days. °  °This information is not intended to replace advice given to you by your health care provider. Make sure you discuss any questions you have with your health care provider. °  °Document Released: 11/30/2000 Document Revised: 12/06/2011 Document Reviewed: 08/01/2011 °Elsevier Interactive Patient Education ©2016 [employer] ° °

## 2015-07-10 NOTE — ED Notes (Signed)
Pt reports possibly having several abscesses in her pelvis area.  Pt with low grade fever.  Pt reports feeing nauseous earlier this am.  Denies vomiting at this time.

## 2015-07-10 NOTE — ED Notes (Signed)
Pt reports pain currently as 8/10 throughout pelvic area (external) and states that "one of the boils just popped a few minutes ago." She is unsure of how many there are at present but estimates an additional two or three boils present in groin area.  She also says that her lymph nodes in the groin feel swollen and slightly tender.  Temp today 99.9, unsure of prior temp. No prior hx of abscesses but states that she feels that this could be related to intense sexual intercourse earlier this week. No additional complaints or concerns at this time.

## 2015-07-10 NOTE — ED Provider Notes (Signed)
CSN: [id number]     Arrival date & time 07/10/15  1617 History   First MD Initiated Contact with Patient 07/10/15 1847     Chief Complaint  Patient presents with  . Abscess    HPI   35 year old female presents today with an abscess. Patient reports that 1 week ago she started noticing what appeared to be infected hair follicles around her vagina. She reports that these continue to worsen, with greater worsening on the right. She notes that she attempted using witch hazel, alcohol swabs, and soap but tenderness continued to worsen. She reports the most concerning site his at the base of her right labia major. She denies any vaginal discharge, history of the same, fever, chills, nausea, vomiting.  Past Medical History  Diagnosis Date  . Insomnia   . Anemia   . Bronchitis   . GERD (gastroesophageal reflux disease)   . Migraines   . Seasonal allergies   . Pancreatitis   . Heart murmur   . Hypertension     "havent been taking my medicine in awhile"  . Pregnancy induced hypertension   . Preterm labor   . Arthritis   . Depression     no meds, fine right now  . Insomnia   . PID (acute pelvic inflammatory disease)   . Perimenopausal    Past Surgical History  Procedure Laterality Date  . Cesarean section    . Tubal ligation    . Ovary surgery    . Cholecystectomy N/A 08/27/2013    Procedure: LAPAROSCOPIC CHOLECYSTECTOMY WITH INTRAOPERATIVE CHOLANGIOGRAM;  Surgeon: Valarie Merino, MD;  Location: [facility];  Service: General;  Laterality: N/A;  . Dilation and curettage of uterus     Family History  Problem Relation Age of Onset  . Cancer Mother     colon  . Arthritis Mother   . Hypertension Father   . Liver disease Father   . Stroke Father   . Diabetes Paternal Grandmother   . Hypertension Paternal Grandmother   . Heart disease Paternal Grandmother    Social History  Substance Use Topics  . Smoking status: Light Tobacco Smoker -- 0.25 packs/day for 12 years  . Smokeless  tobacco: Never Used  . Alcohol Use: No   OB History    Gravida Para Term Preterm AB TAB SAB Ectopic Multiple Living   Review of Systems  All other systems reviewed and are negative.   Allergies  Aspirin; Hydromorphone hcl; and Morphine  Home Medications   Prior to Admission medications   Medication Sig Start Date End Date Taking? Authorizing Provider  diphenhydrAMINE (BENADRYL) 25 MG tablet Take 25 mg by mouth every 6 (six) hours as needed for allergies.   Yes Historical Provider, MD  ibuprofen (ADVIL,MOTRIN) 200 MG tablet Take 400 mg by mouth every 6 (six) hours as needed for fever.   Yes Historical Provider, MD  alum & mag hydroxide-simeth (MAALOX/MYLANTA) 200-200-20 MG/5ML suspension Take 15 mLs by mouth every 6 (six) hours as needed for indigestion or heartburn. Patient not taking: Reported on 07/10/2015 01/09/14   Francee Piccolo, PA-C  dicyclomine (BENTYL) 20 MG tablet Take 1 tablet (20 mg total) by mouth 2 (two) times daily. Patient not taking: Reported on 07/10/2015 04/19/15   Marlon Pel, PA-C  ferrous sulfate 324 (65 FE) MG TBEC Take 1 tablet (325 mg total) by mouth daily. Patient not taking: Reported on 04/19/2015 03/03/14  Pincus Large, DO  HYDROcodone-acetaminophen (NORCO/VICODIN) 5-325 MG per tablet Take 2 tablets by mouth every 4 (four) hours as needed. Patient not taking: Reported on 04/19/2015 09/11/14   Everlene Farrier, PA-C  HYDROcodone-acetaminophen (NORCO/VICODIN) 5-325 MG tablet Take 1 tablet by mouth every 4 (four) hours as needed. Patient not taking: Reported on 07/10/2015 04/19/15   Marlon Pel, PA-C  naproxen (NAPROSYN) 500 MG tablet Take 1 tablet (500 mg total) by mouth 2 (two) times daily with a meal. Patient not taking: Reported on 04/19/2015 09/11/14   Everlene Farrier, PA-C  nicotine (NICODERM CQ - DOSED IN MG/24 HR) 7 mg/24hr patch Place 1 patch (7 mg total) onto the skin daily. Patient not taking: Reported on 04/19/2015  03/03/14   Pincus Large, DO  omeprazole (PRILOSEC) 20 MG capsule Take 1 capsule (20 mg total) by mouth daily. Patient not taking: Reported on 07/10/2015 09/11/14   Everlene Farrier, PA-C  ondansetron (ZOFRAN) 4 MG tablet Take 1 tablet (4 mg total) by mouth every 6 (six) hours. Patient not taking: Reported on 07/10/2015 04/19/15   Marlon Pel, PA-C  sulfamethoxazole-trimethoprim (BACTRIM DS,SEPTRA DS) 800-160 MG tablet Take 1 tablet by mouth 2 (two) times daily. 07/10/15 07/17/15  Guage Efferson, PA-C   BP 143/92 mmHg  Pulse 79  Temp(Src) 97.9 F (36.6 C) (Oral)  Resp 15  SpO2 100% Physical Exam  Constitutional: She is oriented to person, place, and time. She appears well-developed and well-nourished.  HENT:  Head: Normocephalic and atraumatic.  Eyes: Conjunctivae are normal. Pupils are equal, round, and reactive to light. Right eye exhibits no discharge. Left eye exhibits no discharge. No scleral icterus.  Neck: Normal range of motion. No JVD present. No tracheal deviation present.  Pulmonary/Chest: Effort normal. No stridor.  Genitourinary:     2 areas of what appear to be developing abscess. The one on the right as fluctuance with surrounding induration, no signs of surrounding cellulitis. He one on the left appears to be multiple areas of infected follicles that are beginning to coalesce, with no area of drainable abscess.  Neurological: She is alert and oriented to person, place, and time. Coordination normal.  Psychiatric: She has a normal mood and affect. Her behavior is normal. Judgment and thought content normal.  Nursing note and vitals reviewed.   ED Course  Procedures (including critical care time)  INCISION AND DRAINAGE Performed by: Thermon Leyland Consent: Verbal consent obtained. Risks and benefits: risks, benefits and alternatives were discussed Type: abscess  Body area: Vagina  Anesthesia: local infiltration  Incision was made with a scalpel.  Local  anesthetic: lidocaine 2 %   Anesthetic total: 3 ml  Complexity: complex Blunt dissection to break up loculations  Drainage: purulent  Drainage amount: 1 mL   Packing material:   Patient tolerance: Patient tolerated the procedure well with no immediate complications.   Labs Review Labs Reviewed - No data to display  Imaging Review No results found. I have personally reviewed and evaluated these images and lab results as part of my medical decision-making.   EKG Interpretation None      MDM   Final diagnoses:  Abscess    Labs:  Imaging:   Consults:  Therapeutics:  Discharge Meds:   Assessment/Plan: Patient presents with abscess. Successful I&D here. I do not believe this is a Bartholin gland cyst as it appears to be lower, patient has numerous, with beginning as infected hair follicles. Patient will be placed on Bactrim, and  verbalized understanding and  agreement for today's plan and had no further questions or concerns at time of dischargeinstructed to return to the emergency room in 2 days for reevaluation as she does not have adequate access to permit care follow-up. Patient has no systemic symptoms. Patient        Eyvonne Mechanic, PA-C 07/11/15 0110  Mancel Bale, MD 07/11/15 [phone]

## 2015-07-10 NOTE — ED Notes (Signed)
Patient d/c'd self care.  F/U and medications discussed.  Return to work note given, patient verbalized understanding.

## 2015-07-13 ENCOUNTER — Encounter (HOSPITAL_COMMUNITY): Payer: Self-pay | Admitting: Emergency Medicine

## 2015-07-13 ENCOUNTER — Emergency Department (HOSPITAL_COMMUNITY)
Admission: EM | Admit: 2015-07-13 | Discharge: 2015-07-13 | Disposition: A | Discharge status: Home / Self Care | Payer: Self-pay | Attending: Emergency Medicine | Admitting: Emergency Medicine

## 2015-07-13 DIAGNOSIS — N764 Abscess of vulva: Secondary | ICD-10-CM | POA: Insufficient documentation

## 2015-07-13 DIAGNOSIS — Z8659 Personal history of other mental and behavioral disorders: Secondary | ICD-10-CM | POA: Insufficient documentation

## 2015-07-13 DIAGNOSIS — L0501 Pilonidal cyst with abscess: Secondary | ICD-10-CM | POA: Insufficient documentation

## 2015-07-13 DIAGNOSIS — I10 Essential (primary) hypertension: Secondary | ICD-10-CM | POA: Insufficient documentation

## 2015-07-13 DIAGNOSIS — F17200 Nicotine dependence, unspecified, uncomplicated: Secondary | ICD-10-CM | POA: Insufficient documentation

## 2015-07-13 DIAGNOSIS — Z8669 Personal history of other diseases of the nervous system and sense organs: Secondary | ICD-10-CM | POA: Insufficient documentation

## 2015-07-13 DIAGNOSIS — Z8751 Personal history of pre-term labor: Secondary | ICD-10-CM | POA: Insufficient documentation

## 2015-07-13 DIAGNOSIS — Z8739 Personal history of other diseases of the musculoskeletal system and connective tissue: Secondary | ICD-10-CM | POA: Insufficient documentation

## 2015-07-13 DIAGNOSIS — R011 Cardiac murmur, unspecified: Secondary | ICD-10-CM | POA: Insufficient documentation

## 2015-07-13 DIAGNOSIS — Z8709 Personal history of other diseases of the respiratory system: Secondary | ICD-10-CM | POA: Insufficient documentation

## 2015-07-13 DIAGNOSIS — Z8719 Personal history of other diseases of the digestive system: Secondary | ICD-10-CM | POA: Insufficient documentation

## 2015-07-13 DIAGNOSIS — Z862 Personal history of diseases of the blood and blood-forming organs and certain disorders involving the immune mechanism: Secondary | ICD-10-CM | POA: Insufficient documentation

## 2015-07-13 MED ORDER — DOXYCYCLINE HYCLATE 100 MG PO CAPS: 100.0000 mg | ORAL_CAPSULE | Freq: Two times a day (BID) | ORAL | Status: DC

## 2015-07-13 MED ORDER — LIDOCAINE HCL 2 % IJ SOLN
15.0000 mL | Freq: Once | INTRAMUSCULAR | Status: AC
  Administered 2015-07-13: 300 mg
  Filled 2015-07-13: qty 20

## 2015-07-13 NOTE — ED Provider Notes (Signed)
CSN: [id number]     Arrival date & time 07/13/15  4098 History   First MD Initiated Contact with Patient 07/13/15 1007     Chief Complaint: abscesses [name] is a 35 y.o. F PMH significant for PID, HTN presenting for an abscess follow-up. She endorses an abscess on her posterior lower back just above her tailbone, and a new labial abscess since her last visit on 07/11/15. She was started on bactrim, and she endorses medication compliance. She states her other abscesses that were I&D'd have been draining purulent fluid. She denies fevers, chills, CP, SOB, abdominal pain, loss of bladder/bowel control, N/V/D.   Past Medical History  Diagnosis Date  . Insomnia   . Anemia   . Bronchitis   . GERD (gastroesophageal reflux disease)   . Migraines   . Seasonal allergies   . Pancreatitis   . Heart murmur   . Hypertension     "havent been taking my medicine in awhile"  . Pregnancy induced hypertension   . Preterm labor   . Arthritis   . Depression     no meds, fine right now  . Insomnia   . PID (acute pelvic inflammatory disease)   . Perimenopausal    Past Surgical History  Procedure Laterality Date  . Cesarean section    . Tubal ligation    . Ovary surgery    . Cholecystectomy N/A 08/27/2013    Procedure: LAPAROSCOPIC CHOLECYSTECTOMY WITH INTRAOPERATIVE CHOLANGIOGRAM;  Surgeon: Valarie Merino, MD;  Location: [facility];  Service: General;  Laterality: N/A;  . Dilation and curettage of uterus     Family History  Problem Relation Age of Onset  . Cancer Mother     colon  . Arthritis Mother   . Hypertension Father   . Liver disease Father   . Stroke Father   . Diabetes Paternal Grandmother   . Hypertension Paternal Grandmother   . Heart disease Paternal Grandmother    Social History  Substance Use Topics  . Smoking status: Light Tobacco Smoker -- 0.25 packs/day for 12 years  . Smokeless tobacco: Never Used  . Alcohol Use: No   OB History    Gravida Para Term Preterm  AB TAB SAB Ectopic Multiple Living   Review of Systems  Ten systems are reviewed and are negative for acute change except as noted in the HPI  Allergies  Aspirin; Hydromorphone hcl; and Morphine  Home Medications   Prior to Admission medications   Medication Sig Start Date End Date Taking? Authorizing Provider  alum & mag hydroxide-simeth (MAALOX/MYLANTA) 200-200-20 MG/5ML suspension Take 15 mLs by mouth every 6 (six) hours as needed for indigestion or heartburn. Patient not taking: Reported on 07/10/2015 01/09/14   Francee Piccolo, PA-C  dicyclomine (BENTYL) 20 MG tablet Take 1 tablet (20 mg total) by mouth 2 (two) times daily. Patient not taking: Reported on 07/10/2015 04/19/15   Marlon Pel, PA-C  diphenhydrAMINE (BENADRYL) 25 MG tablet Take 25 mg by mouth every 6 (six) hours as needed for allergies.    Historical Provider, MD  doxycycline (VIBRAMYCIN) 100 MG capsule Take 1 capsule (100 mg total) by mouth 2 (two) times daily. 07/13/15   Melton Krebs, PA-C  ferrous sulfate 324 (65 FE) MG TBEC Take 1 tablet (325 mg total) by mouth daily. Patient not taking: Reported on 04/19/2015 03/03/14   Pincus Large, DO  HYDROcodone-acetaminophen (NORCO/VICODIN)  5-325 MG per tablet Take 2 tablets by mouth every 4 (four) hours as needed. Patient not taking: Reported on 04/19/2015 09/11/14   Everlene Farrier, PA-C  HYDROcodone-acetaminophen (NORCO/VICODIN) 5-325 MG tablet Take 1 tablet by mouth every 4 (four) hours as needed. Patient not taking: Reported on 07/10/2015 04/19/15   Marlon Pel, PA-C  ibuprofen (ADVIL,MOTRIN) 200 MG tablet Take 400 mg by mouth every 6 (six) hours as needed for fever.    Historical Provider, MD  naproxen (NAPROSYN) 500 MG tablet Take 1 tablet (500 mg total) by mouth 2 (two) times daily with a meal. Patient not taking: Reported on 04/19/2015 09/11/14   Everlene Farrier, PA-C  nicotine (NICODERM CQ - DOSED IN MG/24 HR) 7 mg/24hr patch Place 1  patch (7 mg total) onto the skin daily. Patient not taking: Reported on 04/19/2015 03/03/14   Pincus Large, DO  omeprazole (PRILOSEC) 20 MG capsule Take 1 capsule (20 mg total) by mouth daily. Patient not taking: Reported on 07/10/2015 09/11/14   Everlene Farrier, PA-C  ondansetron (ZOFRAN) 4 MG tablet Take 1 tablet (4 mg total) by mouth every 6 (six) hours. Patient not taking: Reported on 07/10/2015 04/19/15   Marlon Pel, PA-C   BP 143/80 mmHg  Pulse 91  Temp(Src) 97.6 F (36.4 C) (Oral)  Resp 16  SpO2 99% Physical Exam  Constitutional: She appears well-developed and well-nourished. No distress.  HENT:  Head: Normocephalic and atraumatic.  Mouth/Throat: Oropharynx is clear and moist. No oropharyngeal exudate.  Eyes: Conjunctivae are normal. Pupils are equal, round, and reactive to light. Right eye exhibits no discharge. Left eye exhibits no discharge. No scleral icterus.  Neck: No tracheal deviation present.  Cardiovascular: Normal rate, regular rhythm, normal heart sounds and intact distal pulses.  Exam reveals no gallop and no friction rub.   No murmur heard. Pulmonary/Chest: Effort normal and breath sounds normal. No respiratory distress. She has no wheezes. She has no rales. She exhibits no tenderness.  Abdominal: Soft. Bowel sounds are normal. She exhibits no distension and no mass. There is no tenderness. There is no rebound and no guarding.  Musculoskeletal: She exhibits no edema.  Lymphadenopathy:    She has no cervical adenopathy.  Neurological: She is alert. Coordination normal.  Skin: Skin is warm and dry. No rash noted. She is not diaphoretic. No erythema.  3.5 cm pilonidal cyst with slight erythema. No drainage. 1 cm right inferior labial abscess with erythema. Surrounding labial abscesses are draining purulent fluid.   Psychiatric: She has a normal mood and affect. Her behavior is normal.  Nursing note and vitals reviewed.   ED Course  Procedures   INCISION AND  DRAINAGE Performed by: Anselmo Rod Consent: Verbal consent obtained. Risks and benefits: risks, benefits and alternatives were discussed Type: abscess  Body area: right inferior labia  Anesthesia: local infiltration  Incision was made with a scalpel.  Local anesthetic: lidocaine 2% no epinephrine  Anesthetic total: 2 ml  Complexity: complex Blunt dissection to break up loculations  Drainage: purulent  Drainage amount: minimal  Packing material: 1/4 in iodoform gauze  Patient tolerance: Patient tolerated the procedure well with no immediate complications.  INCISION AND DRAINAGE Performed by: Anselmo Rod Consent: Verbal consent obtained. Risks and benefits: risks, benefits and alternatives were discussed Type: abscess  Body area: posterior lower back  Anesthesia: local infiltration  Incision was made with a scalpel.  Local anesthetic: lidocaine 2% without epinephrine  Anesthetic total: 3.5 ml  Complexity: complex Blunt dissection  to break up loculations  Drainage: purulent  Drainage amount: minimal  Packing material: 1/4 in iodoform gauze  Patient tolerance: Patient tolerated the procedure well with no immediate complications.      MDM   Final diagnoses:  Pilonidal abscess  Labial abscess   Patient non-toxic appearing and VSS. I&D x 2.   Patient feels improved after observation and/or treatment in ED.  Patient may be safely discharged home with  Discharge Medication List as of 07/13/2015 11:46 AM    START taking these medications   Details  doxycycline (VIBRAMYCIN) 100 MG capsule Take 1 capsule (100 mg total) by mouth 2 (two) times daily., Starting 07/13/2015, Until Discontinued, Print       Discussed reasons for return. Patient to follow-up with primary care provider within one week. She was provided a referral to general surgery for her pilonidal cyst. Patient in understanding and agreement with the plan.   Melton Krebs,  PA-C 07/16/15 1610  Benjiman Core, MD 07/16/15 [phone]

## 2015-07-13 NOTE — Discharge Instructions (Signed)
Ms. Kayla Dunn,  Nice meeting you! Please follow-up with a general surgeon for your pilonidal cyst. Return to the emergency department if you develop fevers, do not improve on antibiotics. Feel better soon!  S. Lane Hacker, PA-C   Abscess An abscess is an infected area that contains a collection of pus and debris.It can occur in almost any part of the body. An abscess is also known as a furuncle or boil. CAUSES  An abscess occurs when tissue gets infected. This can occur from blockage of oil or sweat glands, infection of hair follicles, or a minor injury to the skin. As the body tries to fight the infection, pus collects in the area and creates pressure under the skin. This pressure causes pain. People with weakened immune systems have difficulty fighting infections and get certain abscesses more often.  SYMPTOMS Usually an abscess develops on the skin and becomes a painful mass that is red, warm, and tender. If the abscess forms under the skin, you may feel a moveable soft area under the skin. Some abscesses break open (rupture) on their own, but most will continue to get worse without care. The infection can spread deeper into the body and eventually into the bloodstream, causing you to feel ill.  DIAGNOSIS  Your caregiver will take your medical history and perform a physical exam. A sample of fluid may also be taken from the abscess to determine what is causing your infection. TREATMENT  Your caregiver may prescribe antibiotic medicines to fight the infection. However, taking antibiotics alone usually does not cure an abscess. Your caregiver may need to make a small cut (incision) in the abscess to drain the pus. In some cases, gauze is packed into the abscess to reduce pain and to continue draining the area. HOME CARE INSTRUCTIONS   Only take over-the-counter or prescription medicines for pain, discomfort, or fever as directed by your caregiver.  If you were prescribed antibiotics,  take them as directed. Finish them even if you start to feel better.  If gauze is used, follow your caregiver's directions for changing the gauze.  To avoid spreading the infection:  Keep your draining abscess covered with a bandage.  Wash your hands well.  Do not share personal care items, towels, or whirlpools with others.  Avoid skin contact with others.  Keep your skin and clothes clean around the abscess.  Keep all follow-up appointments as directed by your caregiver. SEEK MEDICAL CARE IF:   You have increased pain, swelling, redness, fluid drainage, or bleeding.  You have muscle aches, chills, or a general ill feeling.  You have a fever. MAKE SURE YOU:   Understand these instructions.  Will watch your condition.  Will get help right away if you are not doing well or get worse.   This information is not intended to replace advice given to you by your health care provider. Make sure you discuss any questions you have with your health care provider.   Document Released: 03/16/2005 Document Revised: 12/06/2011 Document Reviewed: 08/19/2011 Elsevier Interactive Patient Education [employer]

## 2015-07-13 NOTE — ED Notes (Signed)
Pt states she was seen a few days ago in the ED for abscesses on her back, was told to return for followup, states she has a new abscess on her back since last visit.

## 2015-09-04 ENCOUNTER — Emergency Department (HOSPITAL_COMMUNITY)
Admission: EM | Admit: 2015-09-04 | Discharge: 2015-09-04 | Disposition: A | Discharge status: Home / Self Care | Payer: Self-pay | Attending: Emergency Medicine | Admitting: Emergency Medicine

## 2015-09-04 ENCOUNTER — Encounter (HOSPITAL_COMMUNITY): Payer: Self-pay | Admitting: Emergency Medicine

## 2015-09-04 DIAGNOSIS — Z8719 Personal history of other diseases of the digestive system: Secondary | ICD-10-CM | POA: Insufficient documentation

## 2015-09-04 DIAGNOSIS — J029 Acute pharyngitis, unspecified: Secondary | ICD-10-CM | POA: Insufficient documentation

## 2015-09-04 DIAGNOSIS — R011 Cardiac murmur, unspecified: Secondary | ICD-10-CM | POA: Insufficient documentation

## 2015-09-04 DIAGNOSIS — I10 Essential (primary) hypertension: Secondary | ICD-10-CM | POA: Insufficient documentation

## 2015-09-04 DIAGNOSIS — R112 Nausea with vomiting, unspecified: Secondary | ICD-10-CM | POA: Insufficient documentation

## 2015-09-04 DIAGNOSIS — Z8742 Personal history of other diseases of the female genital tract: Secondary | ICD-10-CM | POA: Insufficient documentation

## 2015-09-04 DIAGNOSIS — Z8659 Personal history of other mental and behavioral disorders: Secondary | ICD-10-CM | POA: Insufficient documentation

## 2015-09-04 DIAGNOSIS — R509 Fever, unspecified: Secondary | ICD-10-CM | POA: Insufficient documentation

## 2015-09-04 DIAGNOSIS — R197 Diarrhea, unspecified: Secondary | ICD-10-CM | POA: Insufficient documentation

## 2015-09-04 DIAGNOSIS — G4700 Insomnia, unspecified: Secondary | ICD-10-CM | POA: Insufficient documentation

## 2015-09-04 DIAGNOSIS — M791 Myalgia: Secondary | ICD-10-CM | POA: Insufficient documentation

## 2015-09-04 DIAGNOSIS — Z792 Long term (current) use of antibiotics: Secondary | ICD-10-CM | POA: Insufficient documentation

## 2015-09-04 DIAGNOSIS — Z862 Personal history of diseases of the blood and blood-forming organs and certain disorders involving the immune mechanism: Secondary | ICD-10-CM | POA: Insufficient documentation

## 2015-09-04 DIAGNOSIS — R6889 Other general symptoms and signs: Secondary | ICD-10-CM

## 2015-09-04 DIAGNOSIS — R05 Cough: Secondary | ICD-10-CM | POA: Insufficient documentation

## 2015-09-04 DIAGNOSIS — J3489 Other specified disorders of nose and nasal sinuses: Secondary | ICD-10-CM | POA: Insufficient documentation

## 2015-09-04 DIAGNOSIS — F17200 Nicotine dependence, unspecified, uncomplicated: Secondary | ICD-10-CM | POA: Insufficient documentation

## 2015-09-04 DIAGNOSIS — Z8751 Personal history of pre-term labor: Secondary | ICD-10-CM | POA: Insufficient documentation

## 2015-09-04 DIAGNOSIS — M1990 Unspecified osteoarthritis, unspecified site: Secondary | ICD-10-CM | POA: Insufficient documentation

## 2015-09-04 LAB — RAPID STREP SCREEN (MED CTR MEBANE ONLY): Streptococcus, Group A Screen (Direct): NEGATIVE

## 2015-09-04 MED ORDER — BENZONATATE 100 MG PO CAPS: 100.0000 mg | ORAL_CAPSULE | Freq: Three times a day (TID) | ORAL | Status: DC

## 2015-09-04 MED ORDER — ONDANSETRON 4 MG PO TBDP: 4.0000 mg | ORAL_TABLET | Freq: Three times a day (TID) | ORAL | Status: DC | PRN

## 2015-09-04 NOTE — ED Provider Notes (Signed)
CSN: [id number]     Arrival date & time 09/04/15  1727 History  By signing my name below, I, Kayla Dunn, attest that this documentation has been prepared under the direction and in the presence of Kayla Bonawitz, PA-C. Electronically Signed: Ronney Dunn, ED Scribe. 09/04/2015. 6:42 PM.    Chief Complaint  Patient presents with  . Flu symptoms    The history is provided by the patient. No language interpreter was used.    HPI Comments: Kayla Dunn is a 35 y.o. female with a history of bronchitis and seasonal allergies, who presents to the Emergency Department with multiple complaints, including nasal congestion that began 2-3 days ago. She states she initially thought she had seasonal allergy symptoms, but she then gradually developed a severe sore throat, rhinorrhea, cough, subjective fever, chills, generalized myalgias, diarrhea, nausea, and one episode of vomiting that began today. She states she only has mild nausea presently. Patient states she had tried Theraflu with minimal relief. She denies any urinary, abdominal pain, chest pain, shortness of breath, or any other symptoms.   Past Medical History  Diagnosis Date  . Insomnia   . Anemia   . Bronchitis   . GERD (gastroesophageal reflux disease)   . Migraines   . Seasonal allergies   . Pancreatitis   . Heart murmur   . Hypertension     "havent been taking my medicine in awhile"  . Pregnancy induced hypertension   . Preterm labor   . Arthritis   . Depression     no meds, fine right now  . Insomnia   . PID (acute pelvic inflammatory disease)   . Perimenopausal    Past Surgical History  Procedure Laterality Date  . Cesarean section    . Tubal ligation    . Ovary surgery    . Cholecystectomy N/A 08/27/2013    Procedure: LAPAROSCOPIC CHOLECYSTECTOMY WITH INTRAOPERATIVE CHOLANGIOGRAM;  Surgeon: Valarie Merino, MD;  Location: [facility];  Service: General;  Laterality: N/A;  . Dilation and curettage of uterus     Family History   Problem Relation Age of Onset  . Cancer Mother     colon  . Arthritis Mother   . Hypertension Father   . Liver disease Father   . Stroke Father   . Diabetes Paternal Grandmother   . Hypertension Paternal Grandmother   . Heart disease Paternal Grandmother    Social History  Substance Use Topics  . Smoking status: Light Tobacco Smoker -- 0.25 packs/day for 12 years  . Smokeless tobacco: Never Used  . Alcohol Use: No   OB History    Gravida Para Term Preterm AB TAB SAB Ectopic Multiple Living   Review of Systems  Constitutional: Positive for fever and chills.  HENT: Positive for congestion, rhinorrhea and sore throat.   Respiratory: Positive for cough.   Gastrointestinal: Positive for nausea, vomiting and diarrhea.  Genitourinary: Negative for dysuria, urgency, hematuria, decreased urine volume and difficulty urinating.  Musculoskeletal: Positive for myalgias (generalized).  All other systems reviewed and are negative.  Allergies  Aspirin; Hydromorphone hcl; and Morphine  Home Medications   Prior to Admission medications   Medication Sig Start Date End Date Taking? Authorizing Provider  benzonatate (TESSALON) 100 MG capsule Take 1 capsule (100 mg total) by mouth every 8 (eight) hours. 09/04/15   Breyon Sigg C Kashlynn Kundert, PA-C  diphenhydrAMINE (BENADRYL) 25 MG tablet Take 25 mg  by mouth every 6 (six) hours as needed for allergies.    Historical Provider, MD  doxycycline (VIBRAMYCIN) 100 MG capsule Take 1 capsule (100 mg total) by mouth 2 (two) times daily. 07/13/15   Melton KrebsSamantha Nicole Riley, PA-C  ibuprofen (ADVIL,MOTRIN) 200 MG tablet Take 400 mg by mouth every 6 (six) hours as needed for fever.    Historical Provider, MD  ondansetron (ZOFRAN ODT) 4 MG disintegrating tablet Take 1 tablet (4 mg total) by mouth every 8 (eight) hours as needed for nausea or vomiting. 09/04/15   Brezlyn Manrique C Parrish Daddario, PA-C   BP 142/77 mmHg  Pulse 83  Temp(Src) 98.3 F (36.8 C)  Resp 17  SpO2  98% Physical Exam  Constitutional: She appears well-developed and well-nourished. No distress.  HENT:  Head: Normocephalic and atraumatic.  Mouth/Throat: Posterior oropharyngeal edema and posterior oropharyngeal erythema present. No oropharyngeal exudate.  Edema and erythema in posterior oropharynx. No exudate.   Eyes: Conjunctivae are normal.  Neck:  Minor cervical lymphadenopathy and tenderness.   Cardiovascular: Normal rate, regular rhythm and normal heart sounds.  Exam reveals no gallop and no friction rub.   No murmur heard. Pulmonary/Chest: Effort normal and breath sounds normal. No respiratory distress. She has no wheezes. She has no rales. She exhibits no tenderness.  Abdominal: Soft. Bowel sounds are normal. There is no tenderness.  Lymphadenopathy:    She has cervical adenopathy.  Neurological: She is alert.  Skin: Skin is dry. She is not diaphoretic.  Nursing note and vitals reviewed.   ED Course  Procedures (including critical care time)  DIAGNOSTIC STUDIES: Oxygen Saturation is 98% on RA, normal by my interpretation.    COORDINATION OF CARE: 6:36 PM - Suspect influenza. Discussed treatment plan with pt at bedside which includes rest, hydration, cough medication, nausea medication, ibuprofen. Pt verbalized understanding and agreed to plan.   MDM   Final diagnoses:  Flu-like symptoms   Kayla Dunn presents with flulike symptoms for the last 3 days.  Patient's presentation is consistent with a viral illness, such as influenza. Strep test was negative. Symptomatic care and return precautions discussed. Patient voiced understanding of these instructions and is comfortable with discharge. Patient appears safe for discharge at this time.  I personally performed the services described in this documentation, which was scribed in my presence. The recorded information has been reviewed and is accurate.      Anselm PancoastShawn C Dashana Guizar, PA-C 09/04/15 1900  Linwood DibblesJon Knapp, MD 09/05/15  1346

## 2015-09-04 NOTE — Discharge Instructions (Signed)
You have been seen today for flu like symptoms. Your lab tests showed no abnormalities. Your symptoms are consistent with a viral illness. Viruses do not require antibiotics. Treatment is symptomatic care. Drink plenty of fluids and get plenty of rest. Ibuprofen or Tylenol for pain or fever. Zofran for nausea. Tessalon for cough. Plain Mucinex may help relieve congestion. Warm liquids or Chloraseptic spray may help soothe the sore throat. Follow up with PCP as needed. Return to ED should symptoms worsen.

## 2015-09-04 NOTE — ED Notes (Signed)
Pt c/o fever, 100.1, n/v/d, body aches, sore throat , cough, slightly productive. Symptoms x 3 days. She has sick contacts at home. States she has used Motrin with some relief. States she is only slightly nauseous now. She is alert, no acute distress. Skin warm, dry. Ambulatory to exam room with steady gait.

## 2015-09-06 LAB — CULTURE, GROUP A STREP (THRC)

## 2016-08-22 ENCOUNTER — Emergency Department (HOSPITAL_COMMUNITY): Payer: No Typology Code available for payment source

## 2016-08-22 ENCOUNTER — Encounter (HOSPITAL_COMMUNITY): Payer: Self-pay | Admitting: Emergency Medicine

## 2016-08-22 ENCOUNTER — Emergency Department (HOSPITAL_COMMUNITY)
Admission: EM | Admit: 2016-08-22 | Discharge: 2016-08-23 | Disposition: A | Discharge status: Home / Self Care | Payer: No Typology Code available for payment source | Attending: Emergency Medicine | Admitting: Emergency Medicine

## 2016-08-22 DIAGNOSIS — F17200 Nicotine dependence, unspecified, uncomplicated: Secondary | ICD-10-CM | POA: Diagnosis not present

## 2016-08-22 DIAGNOSIS — Y92410 Unspecified street and highway as the place of occurrence of the external cause: Secondary | ICD-10-CM | POA: Diagnosis not present

## 2016-08-22 DIAGNOSIS — S299XXA Unspecified injury of thorax, initial encounter: Secondary | ICD-10-CM | POA: Insufficient documentation

## 2016-08-22 DIAGNOSIS — I10 Essential (primary) hypertension: Secondary | ICD-10-CM | POA: Diagnosis not present

## 2016-08-22 DIAGNOSIS — S4992XA Unspecified injury of left shoulder and upper arm, initial encounter: Secondary | ICD-10-CM | POA: Insufficient documentation

## 2016-08-22 DIAGNOSIS — Y9389 Activity, other specified: Secondary | ICD-10-CM | POA: Diagnosis not present

## 2016-08-22 DIAGNOSIS — Z79899 Other long term (current) drug therapy: Secondary | ICD-10-CM | POA: Diagnosis not present

## 2016-08-22 DIAGNOSIS — Y999 Unspecified external cause status: Secondary | ICD-10-CM | POA: Diagnosis not present

## 2016-08-22 DIAGNOSIS — S8992XA Unspecified injury of left lower leg, initial encounter: Secondary | ICD-10-CM | POA: Diagnosis not present

## 2016-08-22 HISTORY — DX: Post-traumatic stress disorder, unspecified: F43.10

## 2016-08-22 LAB — I-STAT BETA HCG BLOOD, ED (MC, WL, AP ONLY): I-stat hCG, quantitative: <5 m[IU]/mL

## 2016-08-22 LAB — I-STAT CHEM 8, ED
BUN: 8 mg/dL (ref 6–20)
Calcium, Ion: 1.16 mmol/L (ref 1.15–1.40)
Chloride: 102 mmol/L (ref 101–111)
Creatinine, Ser: 0.7 mg/dL (ref 0.44–1.00)
Glucose, Bld: 90 mg/dL (ref 65–99)
HCT: 25 % — ABNORMAL LOW (ref 36.0–46.0)
Hemoglobin: 8.5 g/dL — ABNORMAL LOW (ref 12.0–15.0)
Potassium: 3.7 mmol/L (ref 3.5–5.1)
Sodium: 140 mmol/L (ref 135–145)
TCO2: 25 mmol/L (ref 0–100)
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
## 2016-08-22 MED ORDER — IOPAMIDOL (ISOVUE-300) INJECTION 61%
INTRAVENOUS | Status: AC
  Administered 2016-08-22: 75 mL
  Filled 2016-08-22: qty 75
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
## 2016-08-22 NOTE — ED Notes (Signed)
Patient transported to CT 

## 2016-08-22 NOTE — ED Notes (Signed)
With 2 kids being seen in PEDS

## 2016-08-23 MED ORDER — CYCLOBENZAPRINE HCL 10 MG PO TABS: 10.0000 mg | ORAL_TABLET | Freq: Two times a day (BID) | ORAL | 0 refills | Status: DC | PRN

## 2016-08-23 NOTE — ED Notes (Signed)
PA at bedside.

## 2017-03-04 ENCOUNTER — Emergency Department (HOSPITAL_COMMUNITY)
Admission: EM | Admit: 2017-03-04 | Discharge: 2017-03-04 | Disposition: A | Discharge status: Home / Self Care | Payer: Self-pay | Attending: Emergency Medicine | Admitting: Emergency Medicine

## 2017-03-04 ENCOUNTER — Encounter (HOSPITAL_COMMUNITY): Payer: Self-pay

## 2017-03-04 DIAGNOSIS — N760 Acute vaginitis: Secondary | ICD-10-CM | POA: Insufficient documentation

## 2017-03-04 DIAGNOSIS — I10 Essential (primary) hypertension: Secondary | ICD-10-CM | POA: Insufficient documentation

## 2017-03-04 DIAGNOSIS — M62838 Other muscle spasm: Secondary | ICD-10-CM

## 2017-03-04 DIAGNOSIS — B9689 Other specified bacterial agents as the cause of diseases classified elsewhere: Secondary | ICD-10-CM

## 2017-03-04 DIAGNOSIS — R21 Rash and other nonspecific skin eruption: Secondary | ICD-10-CM | POA: Insufficient documentation

## 2017-03-04 DIAGNOSIS — R202 Paresthesia of skin: Secondary | ICD-10-CM | POA: Insufficient documentation

## 2017-03-04 DIAGNOSIS — F17200 Nicotine dependence, unspecified, uncomplicated: Secondary | ICD-10-CM | POA: Insufficient documentation

## 2017-03-04 DIAGNOSIS — K649 Unspecified hemorrhoids: Secondary | ICD-10-CM

## 2017-03-04 LAB — WET PREP, GENITAL
Sperm: NONE SEEN
TRICH WET PREP: NONE SEEN
YEAST WET PREP: NONE SEEN

## 2017-03-04 MED ORDER — CYCLOBENZAPRINE HCL 10 MG PO TABS: 10.0000 mg | ORAL_TABLET | Freq: Two times a day (BID) | ORAL | 0 refills | Status: DC | PRN

## 2017-03-04 MED ORDER — METRONIDAZOLE 500 MG PO TABS: 500.0000 mg | ORAL_TABLET | Freq: Two times a day (BID) | ORAL | 0 refills | Status: DC

## 2017-03-04 MED ORDER — CYCLOBENZAPRINE HCL 10 MG PO TABS
5.0000 mg | ORAL_TABLET | Freq: Once | ORAL | Status: AC
  Administered 2017-03-04: 5 mg via ORAL
  Filled 2017-03-04: qty 1
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
## 2017-03-04 NOTE — Discharge Instructions (Signed)
Please read instructions below. Apply heat to your shoulder for relief of the muscle spasm.  You can take flexeril every 12 hours as needed for spasm. Schedule an appointment with primary care provider to establish care and follow-up on your shoulder pain. Please schedule an appointment for follow up with the women's clinic.  Finish your antibiotic (Flagyl/Metronidazole) as prescribed. Do not drink alcohol with this medication as it will cause vomiting. You will receive a call from the hospital if your test results come back positive. Avoid sexual activity until you know your test results. If your results come back positive, it is important that you inform all of your sexual partners.  Return to the ER for new or worsening symptoms.
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
## 2017-03-06 LAB — GC/CHLAMYDIA PROBE AMP (~~LOC~~) NOT AT ARMC
CHLAMYDIA, DNA PROBE: NEGATIVE
Neisseria Gonorrhea: NEGATIVE

## 2017-03-18 ENCOUNTER — Emergency Department (HOSPITAL_COMMUNITY): Payer: No Typology Code available for payment source

## 2017-03-18 ENCOUNTER — Emergency Department (HOSPITAL_COMMUNITY)
Admission: EM | Admit: 2017-03-18 | Discharge: 2017-03-18 | Disposition: A | Discharge status: Home / Self Care | Payer: No Typology Code available for payment source | Attending: Emergency Medicine | Admitting: Emergency Medicine

## 2017-03-18 ENCOUNTER — Encounter (HOSPITAL_COMMUNITY): Payer: Self-pay | Admitting: Emergency Medicine

## 2017-03-18 DIAGNOSIS — Z79899 Other long term (current) drug therapy: Secondary | ICD-10-CM | POA: Diagnosis not present

## 2017-03-18 DIAGNOSIS — S161XXA Strain of muscle, fascia and tendon at neck level, initial encounter: Secondary | ICD-10-CM | POA: Insufficient documentation

## 2017-03-18 DIAGNOSIS — Y999 Unspecified external cause status: Secondary | ICD-10-CM | POA: Diagnosis not present

## 2017-03-18 DIAGNOSIS — I10 Essential (primary) hypertension: Secondary | ICD-10-CM | POA: Diagnosis not present

## 2017-03-18 DIAGNOSIS — S199XXA Unspecified injury of neck, initial encounter: Secondary | ICD-10-CM | POA: Diagnosis present

## 2017-03-18 DIAGNOSIS — F17210 Nicotine dependence, cigarettes, uncomplicated: Secondary | ICD-10-CM | POA: Insufficient documentation

## 2017-03-18 DIAGNOSIS — R202 Paresthesia of skin: Secondary | ICD-10-CM | POA: Diagnosis not present

## 2017-03-18 DIAGNOSIS — Y92481 Parking lot as the place of occurrence of the external cause: Secondary | ICD-10-CM | POA: Diagnosis not present

## 2017-03-18 DIAGNOSIS — Y9389 Activity, other specified: Secondary | ICD-10-CM | POA: Diagnosis not present
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
## 2017-03-18 MED ORDER — KETOROLAC TROMETHAMINE 60 MG/2ML IM SOLN
30.0000 mg | Freq: Once | INTRAMUSCULAR | Status: AC
  Administered 2017-03-18: 30 mg via INTRAMUSCULAR
  Filled 2017-03-18: qty 2

## 2017-03-18 MED ORDER — CYCLOBENZAPRINE HCL 10 MG PO TABS: 10.0000 mg | ORAL_TABLET | Freq: Two times a day (BID) | ORAL | 0 refills | Status: DC | PRN

## 2017-03-18 NOTE — ED Notes (Signed)
Pt ambulatory to bed with steady gait

## 2017-03-18 NOTE — ED Notes (Signed)
Per Clydie Braun in mini lab, pt POC urine resulted negative. Results did not transfer over to chart. Mardella Layman, [place] aware. Instructed pt is now able to have ordered toradol.

## 2017-03-18 NOTE — ED Notes (Signed)
Pt verbalized understanding of dc instructions.
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
## 2017-03-18 NOTE — ED Notes (Signed)
POC pregnancy test NEGATIVE 

## 2017-03-18 NOTE — ED Notes (Signed)
ED Provider at bedside. 
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
## 2017-03-20 LAB — POC URINE PREG, ED: PREG TEST UR: NEGATIVE

## 2017-03-21 ENCOUNTER — Ambulatory Visit: Payer: Self-pay

## 2017-03-28 ENCOUNTER — Ambulatory Visit (INDEPENDENT_AMBULATORY_CARE_PROVIDER_SITE_OTHER): Payer: Self-pay | Admitting: General Practice

## 2017-03-28 DIAGNOSIS — Z0000 Encounter for general adult medical examination without abnormal findings: Secondary | ICD-10-CM

## 2017-03-28 NOTE — Progress Notes (Signed)
Patient states she made the appt to make sure her BV is gone. Patient states she frequently gets these infections and finds herself very easily irritated "down there". Patient reports no discharge, irritation, itching or odor. Told patient we do not need to test for cure unless she wants that but retest isn't indicated. Patient verbalized understanding and states she doesn't need to retest then. Discussed BV prevention with patient. Patient verbalized understanding and had no questions

## 2017-03-28 NOTE — Addendum Note (Signed)
Addended by: Kathee Delton on: 03/28/2017 01:19 PM   Modules accepted: Level of Service

## 2017-03-29 NOTE — Progress Notes (Signed)
Agree with nursing staff's documentation of this patient's clinic encounter.  Alyn Riedinger, DO    

## 2018-02-23 ENCOUNTER — Encounter (HOSPITAL_COMMUNITY): Payer: Self-pay | Admitting: Emergency Medicine

## 2018-02-23 ENCOUNTER — Emergency Department (HOSPITAL_COMMUNITY)
Admission: EM | Admit: 2018-02-23 | Discharge: 2018-02-23 | Disposition: A | Discharge status: Home / Self Care | Payer: Self-pay | Attending: Emergency Medicine | Admitting: Emergency Medicine

## 2018-02-23 ENCOUNTER — Emergency Department (HOSPITAL_COMMUNITY): Payer: Self-pay

## 2018-02-23 DIAGNOSIS — M533 Sacrococcygeal disorders, not elsewhere classified: Secondary | ICD-10-CM | POA: Insufficient documentation

## 2018-02-23 DIAGNOSIS — F1290 Cannabis use, unspecified, uncomplicated: Secondary | ICD-10-CM | POA: Insufficient documentation

## 2018-02-23 DIAGNOSIS — I10 Essential (primary) hypertension: Secondary | ICD-10-CM | POA: Insufficient documentation

## 2018-02-23 DIAGNOSIS — M5441 Lumbago with sciatica, right side: Secondary | ICD-10-CM | POA: Insufficient documentation

## 2018-02-23 DIAGNOSIS — K1379 Other lesions of oral mucosa: Secondary | ICD-10-CM | POA: Insufficient documentation

## 2018-02-23 DIAGNOSIS — F17210 Nicotine dependence, cigarettes, uncomplicated: Secondary | ICD-10-CM | POA: Insufficient documentation

## 2018-02-23 LAB — POC URINE PREG, ED: Preg Test, Ur: NEGATIVE
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
## 2018-02-23 MED ORDER — METHOCARBAMOL 500 MG PO TABS
500.0000 mg | ORAL_TABLET | Freq: Once | ORAL | Status: AC
  Administered 2018-02-23: 500 mg via ORAL
  Filled 2018-02-23: qty 1

## 2018-02-23 MED ORDER — KETOROLAC TROMETHAMINE 30 MG/ML IJ SOLN
30.0000 mg | Freq: Once | INTRAMUSCULAR | Status: AC
  Administered 2018-02-23: 30 mg via INTRAMUSCULAR
  Filled 2018-02-23: qty 1

## 2018-02-23 MED ORDER — IBUPROFEN 600 MG PO TABS: 600.0000 mg | ORAL_TABLET | Freq: Four times a day (QID) | ORAL | 0 refills | Status: AC | PRN

## 2018-02-23 MED ORDER — METHOCARBAMOL 500 MG PO TABS: 500.0000 mg | ORAL_TABLET | Freq: Two times a day (BID) | ORAL | 0 refills | Status: DC

## 2018-02-23 MED ORDER — PREDNISONE 20 MG PO TABS: 20.0000 mg | ORAL_TABLET | Freq: Every day | ORAL | 0 refills | Status: AC

## 2018-02-23 MED ORDER — PREDNISONE 20 MG PO TABS
40.0000 mg | ORAL_TABLET | Freq: Once | ORAL | Status: AC
  Administered 2018-02-23: 40 mg via ORAL
  Filled 2018-02-23: qty 2

## 2018-02-23 MED ORDER — LIDOCAINE VISCOUS HCL 2 % MT SOLN: 15.0000 mL | OROMUCOSAL | 0 refills | Status: DC | PRN
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
## 2018-02-23 NOTE — Discharge Instructions (Signed)
Please see the information and instructions below regarding your visit.  Your diagnoses today include:  1. Acute bilateral low back pain with right-sided sciatica   2. Sacroiliac pain   3. Other lesions of oral mucosa    About diagnosis. Most episodes of acute low back pain are self-limited. Your exam was reassuring today that the source of your pain is not affecting the spinal cord and nerves that originate in the spinal cord.   If you have a history of disc herniation or arthritis in your spine, the nerves exiting the spine on one side get inflamed. This can cause severe pain. We call this radiculopathy. We do not always know what causes the sudden inflammation.  The lesions in your mouth do not look like thrush at this time, however if this is recurring, you will need work-up by primary care for any condition that would suppress your immune system.  Tests performed today include: See side panel of your discharge paperwork for testing performed today. Vital signs are listed at the bottom of these instructions.   X-rays demonstrate no slipping of the bones on each other, or evidence of inflammation that we can see.  Medications prescribed:    Take any prescribed medications only as prescribed, and any over the counter medications only as directed on the packaging.  You are prescribed Robaxin, a muscle relaxant. Some common side effects of this medication include:  Feeling sleepy.  Dizziness. Take care upon going from a seated to a standing position.  Dry mouth.  Feeling tired or weak.  Hard stools (constipation).  Upset stomach. These are not all of the side effects that may occur. If you have questions about side effects, call your doctor. Call your primary care provider for medical advice about side effects.  This medication can be sedating. Only take this medication as needed. Please do not combine with alcohol. Do not drive or operate machinery while taking this medication.    This medication can interact with some other medications. Make sure to tell any provider you are taking this medication before they prescribe you a new medication.   You are prescribed prednisone, a steroid. This is a medication to help reduce inflammation in the spine.  Common side effects include upset stomach/nausea. You may take this medicine with food if this occurs. Other side effects include restlessness, difficulty sleeping, and increased sweating. Call your healthcare provider if these do not resolve after finishing the medication.  This medicine may increase your blood sugar so additional careful monitoring is needed of blood sugar if you have diabetes. Call your healthcare provider for any signs/symtpoms of high blood sugar such as confusion, feeling sleepy, more thirst, more hunger, passing urine more often, flushing, fast breathing, or breath that smells like fruit.  You are prescribed ibuprofen, a non-steroidal anti-inflammatory agent (NSAID) for pain. You may take 600mg  every 6 hours as needed for pain. If still requiring this medication around the clock for acute pain after 10 days, please see your primary healthcare provider.  Women who are pregnant, breastfeeding, or planning on becoming pregnant should not take non-steroidal anti-inflammatories such as Advil and Aleve. Tylenol is a safe over the counter pain reliever in pregnant women.  You may combine this medication with Tylenol, 650 mg every 6 hours, so you are receiving something for pain every 3 hours.  This is not a long-term medication unless under the care and direction of your primary provider. Taking this medication long-term and not under the  supervision of a healthcare provider could increase the risk of stomach ulcers, kidney problems, and cardiovascular problems such as high blood pressure.   You are prescribed viscous lidocaine swish and spit every 6 hours as needed for tooth and gum discomfort. Do not swallow.      Home care instructions:   Low back pain gets worse the longer you stay stationary. Please keep moving and walking as tolerated. There are exercises included in this packet to perform as tolerated for your low back pain.   Apply heat to the areas that are painful. Avoid twisting or bending your trunk to lift something. Do not lift anything above 25 lbs while recovering from this flare of low back pain.  Please follow any educational materials contained in this packet.   Follow-up instructions: Please follow-up with your primary care provider in as soon as possible for further evaluation of your symptoms if they are not completely improved.   Return instructions:  Please return to the Emergency Department if you experience worsening symptoms.  Please return for any fever or chills in the setting of your back pain, weakness in the muscles of the legs, numbness in your legs and feet that is new or changing, numbness in the area where you wipe, retention of your urine, loss of bowel or bladder control, or problems with walking. Please return if you have any other emergent concerns.  Additional Information:   Your vital signs today were: BP (!) 127/92 (BP Location: Left Arm)    Pulse 91    Temp 99 F (37.2 C) (Oral)    Resp 16    LMP 01/27/2018 (Exact Date) Comment: tubal ligation   SpO2 100%  If your blood pressure (BP) was elevated on multiple readings during this visit above 130 for the top number or above 80 for the bottom number, please have this repeated by your primary care provider within one month. --------------  Thank you for allowing [place] to participate in your care today.
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
## 2018-03-08 ENCOUNTER — Encounter: Payer: Self-pay | Admitting: Family Medicine

## 2018-03-08 ENCOUNTER — Ambulatory Visit (INDEPENDENT_AMBULATORY_CARE_PROVIDER_SITE_OTHER): Payer: Self-pay | Admitting: Family Medicine

## 2018-03-08 VITALS — BP 138/80 | HR 72 | Temp 98.6°F | Resp 14 | Ht 61.0 in | Wt 116.0 lb

## 2018-03-08 DIAGNOSIS — B370 Candidal stomatitis: Secondary | ICD-10-CM

## 2018-03-08 DIAGNOSIS — M5440 Lumbago with sciatica, unspecified side: Secondary | ICD-10-CM

## 2018-03-08 DIAGNOSIS — R5382 Chronic fatigue, unspecified: Secondary | ICD-10-CM

## 2018-03-08 DIAGNOSIS — D508 Other iron deficiency anemias: Secondary | ICD-10-CM

## 2018-03-08 DIAGNOSIS — K219 Gastro-esophageal reflux disease without esophagitis: Secondary | ICD-10-CM

## 2018-03-08 DIAGNOSIS — G8929 Other chronic pain: Secondary | ICD-10-CM

## 2018-03-08 MED ORDER — FLUCONAZOLE 150 MG PO TABS: 150.0000 mg | ORAL_TABLET | Freq: Once | ORAL | 0 refills | Status: AC

## 2018-03-08 MED ORDER — NYSTATIN 100000 UNIT/ML MT SUSP: 5.0000 mL | Freq: Four times a day (QID) | OROMUCOSAL | 0 refills | Status: AC

## 2018-03-08 NOTE — Patient Instructions (Signed)
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
## 2018-03-09 ENCOUNTER — Encounter (HOSPITAL_COMMUNITY): Payer: Self-pay | Admitting: Emergency Medicine

## 2018-03-09 ENCOUNTER — Observation Stay (HOSPITAL_COMMUNITY)
Admission: EM | Admit: 2018-03-09 | Discharge: 2018-03-10 | Disposition: A | Discharge status: Home / Self Care | Payer: Self-pay | Attending: Internal Medicine | Admitting: Internal Medicine

## 2018-03-09 ENCOUNTER — Telehealth: Payer: Self-pay

## 2018-03-09 DIAGNOSIS — I10 Essential (primary) hypertension: Secondary | ICD-10-CM | POA: Insufficient documentation

## 2018-03-09 DIAGNOSIS — B370 Candidal stomatitis: Secondary | ICD-10-CM | POA: Diagnosis present

## 2018-03-09 DIAGNOSIS — F17210 Nicotine dependence, cigarettes, uncomplicated: Secondary | ICD-10-CM | POA: Insufficient documentation

## 2018-03-09 DIAGNOSIS — K219 Gastro-esophageal reflux disease without esophagitis: Secondary | ICD-10-CM | POA: Diagnosis present

## 2018-03-09 DIAGNOSIS — Z885 Allergy status to narcotic agent status: Secondary | ICD-10-CM | POA: Insufficient documentation

## 2018-03-09 DIAGNOSIS — F17200 Nicotine dependence, unspecified, uncomplicated: Secondary | ICD-10-CM | POA: Diagnosis present

## 2018-03-09 DIAGNOSIS — D509 Iron deficiency anemia, unspecified: Secondary | ICD-10-CM | POA: Insufficient documentation

## 2018-03-09 DIAGNOSIS — N920 Excessive and frequent menstruation with regular cycle: Principal | ICD-10-CM | POA: Insufficient documentation

## 2018-03-09 DIAGNOSIS — D649 Anemia, unspecified: Secondary | ICD-10-CM

## 2018-03-09 DIAGNOSIS — Z886 Allergy status to analgesic agent status: Secondary | ICD-10-CM | POA: Insufficient documentation

## 2018-03-09 DIAGNOSIS — R0602 Shortness of breath: Secondary | ICD-10-CM | POA: Insufficient documentation

## 2018-03-09 DIAGNOSIS — R030 Elevated blood-pressure reading, without diagnosis of hypertension: Secondary | ICD-10-CM | POA: Diagnosis present

## 2018-03-09 DIAGNOSIS — Z79899 Other long term (current) drug therapy: Secondary | ICD-10-CM | POA: Insufficient documentation

## 2018-03-09 LAB — COMPREHENSIVE METABOLIC PANEL
ALT: 22 IU/L (ref 0–32)
AST: 30 IU/L (ref 0–40)
Albumin/Globulin Ratio: 1.7 (ref 1.2–2.2)
Albumin: 4.3 g/dL (ref 3.5–5.5)
Alkaline Phosphatase: 63 IU/L (ref 39–117)
BUN/Creatinine Ratio: 11 (ref 9–23)
BUN: 7 mg/dL (ref 6–20)
Bilirubin Total: <0.2 mg/dL (ref 0.0–1.2)
CO2: 22 mmol/L (ref 20–29)
Calcium: 8.9 mg/dL (ref 8.7–10.2)
Chloride: 105 mmol/L (ref 96–106)
Creatinine, Ser: 0.63 mg/dL (ref 0.57–1.00)
GFR calc Af Amer: 133 mL/min/{1.73_m2} (ref >59)
GFR calc non Af Amer: 116 mL/min/{1.73_m2} (ref >59)
Globulin, Total: 2.6 g/dL (ref 1.5–4.5)
Glucose: 70 mg/dL (ref 65–99)
Potassium: 4.2 mmol/L (ref 3.5–5.2)
Sodium: 143 mmol/L (ref 134–144)
Total Protein: 6.9 g/dL (ref 6.0–8.5)

## 2018-03-09 LAB — CBC WITH DIFFERENTIAL
Basophils Absolute: 0.1 10*3/uL (ref 0.0–0.2)
Basos: 1 %
EOS (ABSOLUTE): 0.1 10*3/uL (ref 0.0–0.4)
Eos: 2 %
Hematocrit: 20.9 % — ABNORMAL LOW (ref 34.0–46.6)
Hemoglobin: 5.4 g/dL — CL (ref 11.1–15.9)
Immature Grans (Abs): 0 10*3/uL (ref 0.0–0.1)
Immature Granulocytes: 0 %
Lymphocytes Absolute: 2 10*3/uL (ref 0.7–3.1)
Lymphs: 33 %
MCH: 15.5 pg — ABNORMAL LOW (ref 26.6–33.0)
MCHC: 25.8 g/dL — ABNORMAL LOW (ref 31.5–35.7)
MCV: 60 fL — ABNORMAL LOW (ref 79–97)
Monocytes Absolute: 0.8 10*3/uL (ref 0.1–0.9)
Monocytes: 14 %
Neutrophils Absolute: 3 10*3/uL (ref 1.4–7.0)
Neutrophils: 50 %
RBC: 3.48 x10E6/uL — ABNORMAL LOW (ref 3.77–5.28)
RDW: 20 % — ABNORMAL HIGH (ref 12.3–15.4)
WBC: 6 10*3/uL (ref 3.4–10.8)

## 2018-03-09 LAB — TSH: TSH: 1.38 u[IU]/mL (ref 0.450–4.500)

## 2018-03-09 LAB — IRON,TIBC AND FERRITIN PANEL
Ferritin: 3 ng/mL — ABNORMAL LOW (ref 15–150)
Iron Saturation: 2 % — CL (ref 15–55)
Iron: 10 ug/dL — ABNORMAL LOW (ref 27–159)
Total Iron Binding Capacity: 454 ug/dL — ABNORMAL HIGH (ref 250–450)
UIBC: 444 ug/dL — ABNORMAL HIGH (ref 131–425)

## 2018-03-09 LAB — BASIC METABOLIC PANEL
Anion gap: 8 (ref 5–15)
BUN: 8 mg/dL (ref 6–20)
CO2: 25 mmol/L (ref 22–32)
CREATININE: 0.58 mg/dL (ref 0.44–1.00)
Calcium: 8.7 mg/dL — ABNORMAL LOW (ref 8.9–10.3)
Chloride: 108 mmol/L (ref 98–111)
GLUCOSE: 76 mg/dL (ref 70–99)
Potassium: 3.5 mmol/L (ref 3.5–5.1)
SODIUM: 141 mmol/L (ref 135–145)

## 2018-03-09 LAB — CBC WITH DIFFERENTIAL/PLATELET
BASOS ABS: 0.1 10*3/uL (ref 0.0–0.1)
Basophils Relative: 1 %
EOS ABS: 0.1 10*3/uL (ref 0.0–0.7)
Eosinophils Relative: 2 %
HCT: 20.5 % — ABNORMAL LOW (ref 36.0–46.0)
HEMOGLOBIN: 5.2 g/dL — AB (ref 12.0–15.0)
LYMPHS PCT: 35 %
Lymphs Abs: 2.6 10*3/uL (ref 0.7–4.0)
MCH: 15.7 pg — AB (ref 26.0–34.0)
MCHC: 25.4 g/dL — ABNORMAL LOW (ref 30.0–36.0)
MCV: 61.7 fL — ABNORMAL LOW (ref 78.0–100.0)
MONO ABS: 0.7 10*3/uL (ref 0.1–1.0)
Monocytes Relative: 9 %
NEUTROS PCT: 53 %
Neutro Abs: 3.8 10*3/uL (ref 1.7–7.7)
PLATELETS: 352 10*3/uL (ref 150–400)
RBC: 3.32 MIL/uL — AB (ref 3.87–5.11)
RDW: 20.8 % — ABNORMAL HIGH (ref 11.5–15.5)
WBC: 7.3 10*3/uL (ref 4.0–10.5)

## 2018-03-09 LAB — POC URINE PREG, ED: Preg Test, Ur: NEGATIVE

## 2018-03-09 LAB — HEMOGLOBIN A1C
Est. average glucose Bld gHb Est-mCnc: 97 mg/dL
Hgb A1c MFr Bld: 5 % (ref 4.8–5.6)

## 2018-03-09 LAB — PROTIME-INR
INR: 1.14
Prothrombin Time: 14.5 seconds (ref 11.4–15.2)

## 2018-03-09 LAB — VITAMIN B12: VITAMIN B 12: 182 pg/mL (ref 180–914)

## 2018-03-09 LAB — FOLATE: FOLATE: 9.7 ng/mL (ref >5.9)

## 2018-03-09 LAB — HIV ANTIBODY (ROUTINE TESTING W REFLEX): HIV Screen 4th Generation wRfx: NONREACTIVE

## 2018-03-09 MED ORDER — SODIUM CHLORIDE 0.9% IV SOLUTION
Freq: Once | INTRAVENOUS | Status: AC
  Administered 2018-03-10: 02:00:00 via INTRAVENOUS

## 2018-03-09 MED ORDER — FAMOTIDINE 20 MG PO TABS
10.0000 mg | ORAL_TABLET | Freq: Every day | ORAL | Status: DC
  Administered 2018-03-10: 10 mg via ORAL
  Filled 2018-03-09: qty 1

## 2018-03-09 MED ORDER — ACETAMINOPHEN 650 MG RE SUPP: 650.0000 mg | Freq: Four times a day (QID) | RECTAL | Status: DC | PRN

## 2018-03-09 MED ORDER — NYSTATIN 100000 UNIT/ML MT SUSP
5.0000 mL | Freq: Four times a day (QID) | OROMUCOSAL | Status: DC
  Administered 2018-03-10: 500000 [IU] via ORAL
  Filled 2018-03-09: qty 5

## 2018-03-09 MED ORDER — SODIUM CHLORIDE 0.9 % IV SOLN: 10.0000 mL/h | Freq: Once | INTRAVENOUS | Status: DC

## 2018-03-09 MED ORDER — ACETAMINOPHEN 325 MG PO TABS
650.0000 mg | ORAL_TABLET | Freq: Four times a day (QID) | ORAL | Status: DC | PRN
  Administered 2018-03-10: 650 mg via ORAL
  Filled 2018-03-09: qty 2

## 2018-03-09 MED ORDER — FLUCONAZOLE 150 MG PO TABS
150.0000 mg | ORAL_TABLET | Freq: Once | ORAL | Status: AC
  Administered 2018-03-10: 150 mg via ORAL
  Filled 2018-03-09 (×2): qty 1

## 2018-03-09 MED ORDER — POTASSIUM CHLORIDE CRYS ER 20 MEQ PO TBCR
40.0000 meq | EXTENDED_RELEASE_TABLET | Freq: Once | ORAL | Status: AC
  Administered 2018-03-09: 40 meq via ORAL
  Filled 2018-03-09: qty 2

## 2018-03-09 MED ORDER — ONDANSETRON HCL 4 MG PO TABS: 4.0000 mg | ORAL_TABLET | Freq: Four times a day (QID) | ORAL | Status: DC | PRN

## 2018-03-09 MED ORDER — ALBUTEROL SULFATE (2.5 MG/3ML) 0.083% IN NEBU: 2.5000 mg | INHALATION_SOLUTION | RESPIRATORY_TRACT | Status: DC | PRN

## 2018-03-09 MED ORDER — METHOCARBAMOL 500 MG PO TABS: 500.0000 mg | ORAL_TABLET | Freq: Two times a day (BID) | ORAL | Status: DC | PRN

## 2018-03-09 MED ORDER — ONDANSETRON HCL 4 MG/2ML IJ SOLN: 4.0000 mg | Freq: Four times a day (QID) | INTRAMUSCULAR | Status: DC | PRN

## 2018-03-09 NOTE — ED Triage Notes (Signed)
Per pt, states she saw PCP yesterday-states she is anemic and her Hgb is low-states she has a history of low iron but has not been treated for over 10 years-denies dizziness, no SOB-states her last menstrual cycle was normal

## 2018-03-09 NOTE — Progress Notes (Signed)
Critical values for hgb. She will need to go to the ED for transfusion

## 2018-03-09 NOTE — ED Notes (Signed)
ED TO INPATIENT HANDOFF REPORT  Name/Age/Gender Kayla Dunn 37 y.o. female  Code Status   Home/SNF/Other Home  Chief Complaint Referred to come for BLood or Iron Transfusion  Level of Care/Admitting Diagnosis ED Disposition    ED Disposition Condition [facility] Area: [place] [100102]  Level of Care: Med-Surg [16]  Diagnosis: Symptomatic anemia [9381017]  Admitting Physician: Toy Baker [[id number]]  Attending Physician: Toy Baker [[id number]]  PT Class (Do Not Modify): Observation [104]  PT Acc Code (Do Not Modify): Observation [10022]       Medical History Past Medical History:  Diagnosis Date  . Anemia   . Arthritis   . Bronchitis   . Depression    no meds, fine right now  . GERD (gastroesophageal reflux disease)   . Heart murmur   . Hypertension    "havent been taking my medicine in awhile"  . Insomnia   . Insomnia   . Migraines   . Pancreatitis   . Perimenopausal   . PID (acute pelvic inflammatory disease)   . Pregnancy induced hypertension   . Preterm labor   . PTSD (post-traumatic stress disorder)   . Seasonal allergies     Allergies Allergies  Allergen Reactions  . Aspirin Anaphylaxis  . Hydromorphone Hcl Anaphylaxis  . Aloe Vera Hives and Itching  . Dust Mite Extract     Seasonal allergies   . Morphine Hives  . Shrimp [Shellfish Allergy] Swelling    IV Location/Drains/Wounds Patient Lines/Drains/Airways Status   Active Line/Drains/Airways    Name:   Placement date:   Placement time:   Site:   Days:   Peripheral IV 03/09/18 Right Forearm   03/09/18    1907    Forearm   less than 1          Labs/Imaging Results for orders placed or performed during the hospital encounter of 03/09/18 (from the past 48 hour(s))  POC urine preg, ED (not at [facility])     Status: None   Collection Time: 03/09/18  6:42 PM  Result Value Ref Range   Preg Test, Ur NEGATIVE NEGATIVE    Comment:        THE  SENSITIVITY OF THIS METHODOLOGY IS >24 mIU/mL   CBC with Differential     Status: Abnormal   Collection Time: 03/09/18  7:05 PM  Result Value Ref Range   WBC 7.3 4.0 - 10.5 K/uL   RBC 3.32 (L) 3.87 - 5.11 MIL/uL   Hemoglobin 5.2 (LL) 12.0 - 15.0 g/dL    Comment: REPEATED TO VERIFY CRITICAL RESULT CALLED TO, READ BACK BY AND VERIFIED WITH: L.COLES AT 2005 ON 03/09/18 BY N.THOMPSON    HCT 20.5 (L) 36.0 - 46.0 %   MCV 61.7 (L) 78.0 - 100.0 fL   MCH 15.7 (L) 26.0 - 34.0 pg   MCHC 25.4 (L) 30.0 - 36.0 g/dL   RDW 20.8 (H) 11.5 - 15.5 %   Platelets 352 150 - 400 K/uL   Neutrophils Relative % 53 %   Lymphocytes Relative 35 %   Monocytes Relative 9 %   Eosinophils Relative 2 %   Basophils Relative 1 %   Neutro Abs 3.8 1.7 - 7.7 K/uL   Lymphs Abs 2.6 0.7 - 4.0 K/uL   Monocytes Absolute 0.7 0.1 - 1.0 K/uL   Eosinophils Absolute 0.1 0.0 - 0.7 K/uL   Basophils Absolute 0.1 0.0 - 0.1 K/uL   RBC Morphology POLYCHROMASIA PRESENT  Comment: TARGET CELLS TEARDROP CELLS Performed at [facility] [address], [place], [place] [address]   Basic metabolic panel     Status: Abnormal   Collection Time: 03/09/18  7:05 PM  Result Value Ref Range   Sodium 141 135 - 145 mmol/L   Potassium 3.5 3.5 - 5.1 mmol/L   Chloride 108 98 - 111 mmol/L   CO2 25 22 - 32 mmol/L   Glucose, Bld 76 70 - 99 mg/dL   BUN 8 6 - 20 mg/dL   Creatinine, Ser 0.58 0.44 - 1.00 mg/dL   Calcium 8.7 (L) 8.9 - 10.3 mg/dL   GFR calc non Af Amer >60 >60 mL/min   GFR calc Af Amer >60 >60 mL/min    Comment: (NOTE) The eGFR has been calculated using the CKD EPI equation. This calculation has not been validated in all clinical situations. eGFR's persistently <60 mL/min signify possible Chronic Kidney Disease.    Anion gap 8 5 - 15    Comment: Performed at [facility], [place] [address], [place], [place] [address]  Protime-INR     Status: None   Collection Time: 03/09/18  9:11 PM   Result Value Ref Range   Prothrombin Time 14.5 11.4 - 15.2 seconds   INR 1.14     Comment: Performed at [facility], [place] [address], [place], [place] [address]  Vitamin B12     Status: None   Collection Time: 03/09/18  9:12 PM  Result Value Ref Range   Vitamin B-12 182 180 - 914 pg/mL    Comment: (NOTE) This assay is not validated for testing neonatal or myeloproliferative syndrome specimens for Vitamin B12 levels. Performed at [facility], [place] [address], [place], [place] [address]   Folate     Status: None   Collection Time: 03/09/18  9:12 PM  Result Value Ref Range   Folate 9.7 >5.9 ng/mL    Comment: Performed at [facility], [place] [address] [place], [place] [address]   No results found.  Pending Labs Unresulted Labs (From admission, onward)    Start     Ordered   03/09/18 2113  Occult blood card to lab, stool RN will collect  STAT,   STAT    Question:  Specimen to be collected by?  Answer:  RN will collect   03/09/18 2113   03/09/18 2031  Prepare RBC  (Adult Blood Administration - PRBC)  STAT,   R    Question Answer Comment  # of Units 2 units   Transfusion Indications Symptomatic Anemia   If emergent release call blood bank Not emergent release      03/09/18 2031   Signed and Held  Magnesium  Tomorrow morning,   R    Comments:  Call MD if <1.5    Signed and Held   Signed and Held  Phosphorus  Tomorrow morning,   R     Signed and Held   Signed and Held  Comprehensive metabolic panel  Once,   R    Comments:  Cal MD for K<3.5 or >5.0    Signed and Held   Signed and Held  CBC  Once,   R    Comments:  Call for hg <8.0    Signed and Held          Vitals/Pain Today's Vitals   03/09/18 1829 03/09/18 1830 03/09/18 1900 03/09/18 1904  BP: (!) 151/96 (!) 162/86 Marland Kitchen)  144/90 (!) 144/90  Pulse: 79 76 76 82  Resp: 15   15  Temp:      TempSrc:      SpO2: 100% 100% 100% 100%  PainSc:        Isolation  Precautions No active isolations  Medications Medications  0.9 %  sodium chloride infusion (has no administration in time range)  potassium chloride SA (K-DUR,KLOR-CON) CR tablet 40 mEq (40 mEq Oral Given 03/09/18 2214)    Mobility walks

## 2018-03-09 NOTE — Telephone Encounter (Signed)
-----   Message from Mike GipAndre Douglas, FNP sent at 03/09/2018  1:39 PM EDT ----- Critical values for hgb. She will need to go to the ED for transfusion

## 2018-03-09 NOTE — Telephone Encounter (Signed)
Called and spoke with patient, advised that her Hemoglobin is critically low and she needs to be seen in the ER for possible transfusion. Patient verbalized understanding. Thanks!

## 2018-03-09 NOTE — H&P (Addendum)
Kayla Dunn:[id number] DOB: [birth year] DOA: 03/09/2018     PCP: Mike Gip[facility] FNP  [facility] Specialists: none    Patient arrived to ER on 03/09/18 at 1553  Patient coming from: home Lives   With family   Chief Complaint:  Chief Complaint  Patient presents with  . abnormal labs    HPI: Kayla Dunn is a 37 y.o. female with medical history significant of anemia, GERD.  Migraine, hypertension, tobacco abuse, sp cholecystectomy and tubal legation    Presented with lightheadedness, fatigue,shortness of breath Went to clinic yesterday was found to have Hg 5.4 last CBC was in 2016 at that time hemoglobin was 9.9 Reports heavy periods lasting 5 days. Last period was last week  Had to have iron infusion once never had any blood transfusion in the past no hx of melena or black stools, hematemesis Her menstrual periods have been doing better lately. She used to take prescription strength iron supplements until she lost her PCP  Recently has had bronchitis and started to use her daughter's inhaled steroids developed significant tongue pain and swelling with white patches  Regarding pertinent Chronic problems: no prior hx of PUD   While in ER: Today Hg 5.2  The following Work up has been ordered so far:  Orders Placed This Encounter  Procedures  . CBC with Differential  . Basic metabolic panel  . Complete patient signature process for consent form  . Practitioner attestation of consent  . Consult to hospitalist  . POC urine preg, ED (not at [facility])  . Prepare RBC  . Saline lock IV    Following Medications were ordered in ER: Medications  0.9 %  sodium chloride infusion (has no administration in time range)    Significant initial  Findings: Abnormal Labs Reviewed  CBC WITH DIFFERENTIAL/PLATELET - Abnormal; Notable for the following components:      Result Value   RBC 3.32 (*)    Hemoglobin 5.2 (*)    HCT 20.5 (*)    MCV 61.7 (*)    MCH 15.7 (*)    MCHC 25.4 (*)    RDW 20.8 (*)    All other components within normal limits  BASIC METABOLIC PANEL - Abnormal; Notable for the following components:   Calcium 8.7 (*)    All other components within normal limits     Na 141 K 3.5  Cr     stable,   Lab Results  Component Value Date   CREATININE 0.58 03/09/2018   CREATININE 0.63 03/08/2018   CREATININE 0.70 08/22/2016      WBC  7.3  HG/HCT ,  Down  from baseline see below in 2018 Hg 8.5 in 2016 Hg  9.9    Component Value Date/Time   HGB 5.2 (LL) 03/09/2018 1905   HGB 5.4 (LL) 03/08/2018 1405   HCT 20.5 (L) 03/09/2018 1905   HCT 20.9 (L) 03/08/2018 1405        UA  not ordered   ECG: not obtained   ED Triage Vitals  Enc Vitals Group     BP 03/09/18 1614 (!) 150/88     Pulse Rate 03/09/18 1614 96     Resp 03/09/18 1614 20     Temp 03/09/18 1614 99.3 F (37.4 C)     Temp Source 03/09/18 1614 Oral     SpO2 03/09/18 1614 100 %     Weight --      Height --  Head Circumference --      Peak Flow --      Pain Score 03/09/18 1620 6     Pain Loc --      Pain Edu? --      Excl. in GC? --   TMAX(24)@       Latest  Blood pressure (!) 144/90, pulse 82, temperature 99.3 F (37.4 C), temperature source Oral, resp. rate 15, last menstrual period 03/01/2018, SpO2 100 %.     Hospitalist was called for admission for symptomatic anemia   Review of Systems:    Pertinent positives include: fatigue,  shortness of breath at rest dyspnea on exertion, back pain.  Constitutional:  No weight loss, night sweats, Fevers, chills, weight loss  HEENT:  No headaches, Difficulty swallowing,Tooth/dental problems,Sore throat,  No sneezing, itching, ear ache, nasal congestion, post nasal drip,  Cardio-vascular:  No chest pain, Orthopnea, PND, anasarca, dizziness, palpitations.no Bilateral lower extremity swelling  GI:  No heartburn, indigestion, abdominal pain, nausea, vomiting, diarrhea, change in bowel habits,  loss of appetite, melena, blood in stool, hematemesis Resp:   No excess mucus, no productive cough, No non-productive cough, No coughing up of blood.No change in color of mucus.No wheezing. Skin:  no rash or lesions. No jaundice GU:  no dysuria, change in color of urine, no urgency or frequency. No straining to urinate.  No flank pain.  Musculoskeletal:  No joint pain or no joint swelling. No decreased range of motion. No Psych:  No change in mood or affect. No depression or anxiety. No memory loss.  Neuro: no localizing neurological complaints, no tingling, no weakness, no double vision, no gait abnormality, no slurred speech, no confusion  All systems reviewed and apart from HOPI all are negative  Past Medical History:   Past Medical History:  Diagnosis Date  . Anemia   . Arthritis   . Bronchitis   . Depression    no meds, fine right now  . GERD (gastroesophageal reflux disease)   . Heart murmur   . Hypertension    "havent been taking my medicine in awhile"  . Insomnia   . Insomnia   . Migraines   . Pancreatitis   . Perimenopausal   . PID (acute pelvic inflammatory disease)   . Pregnancy induced hypertension   . Preterm labor   . PTSD (post-traumatic stress disorder)   . Seasonal allergies       Past Surgical History:  Procedure Laterality Date  . CESAREAN SECTION    . CHOLECYSTECTOMY N/A 08/27/2013   Procedure: LAPAROSCOPIC CHOLECYSTECTOMY WITH INTRAOPERATIVE CHOLANGIOGRAM;  Surgeon: Valarie Merino, MD;  Location: [facility];  Service: General;  Laterality: N/A;  . DILATION AND CURETTAGE OF UTERUS    . OVARY SURGERY    . TUBAL LIGATION      Social History:  Ambulatory  independently     reports that she has been smoking. She has a 3.00 pack-year smoking history. She has never used smokeless tobacco. She reports that she has current or past drug history. Drug: Marijuana. She reports that she does not drink alcohol.     Family History:  Family History    Problem Relation Age of Onset  . Hypertension Father   . Liver disease Father   . Stroke Father   . Cancer Mother        colon  . Arthritis Mother   . Diabetes Paternal Grandmother   . Hypertension Paternal Grandmother   . Heart disease Paternal Grandmother  Allergies: Allergies  Allergen Reactions  . Aspirin Anaphylaxis  . Hydromorphone Hcl Anaphylaxis  . Aloe Vera Hives and Itching  . Dust Mite Extract     Seasonal allergies   . Morphine Hives  . Shrimp [Shellfish Allergy] Swelling     Prior to Admission medications   Medication Sig Start Date End Date Taking? Authorizing Provider  diphenhydrAMINE (BENADRYL) 25 MG tablet Take 25 mg by mouth every 6 (six) hours as needed for allergies.   Yes [provider]  fluconazole (DIFLUCAN) 150 MG tablet Take 150 mg by mouth once.   Yes [provider]  ibuprofen (ADVIL,MOTRIN) 600 MG tablet Take 600 mg by mouth every 6 (six) hours as needed for moderate pain.    Yes [provider]  methocarbamol (ROBAXIN) 500 MG tablet Take 1 tablet (500 mg total) by mouth 2 (two) times daily. Patient taking differently: Take 500 mg by mouth 2 (two) times daily as needed for muscle spasms.  02/23/18  Yes [place] ScrapeMurray, Alyssa B, PA-C  ranitidine (ZANTAC) 150 MG tablet Take 150 mg by mouth 2 (two) times daily as needed for heartburn.    Yes [provider]  lidocaine (XYLOCAINE) 2 % solution Use as directed 15 mLs in the mouth or throat as needed for mouth pain. Swish and spit.  Do not swallow. Patient not taking: Reported on 03/09/2018 02/23/18   Aviva KluverMurray, Alyssa B, PA-C  nystatin (MYCOSTATIN) 100000 UNIT/ML suspension Take 5 mLs (500,000 Units total) by mouth 4 (four) times daily for 14 days. 03/08/18 03/22/18  Mike Gipouglas, Andre, FNP  phenylephrine-shark liver oil-mineral oil-petrolatum (PREPARATION H) 0.25-3-14-71.9 % rectal ointment Place 1 application rectally 2 (two) times daily as needed for hemorrhoids.    [provider]   Physical Exam: Blood pressure (!) 144/90, pulse 82, temperature 99.3 F (37.4 C), temperature source Oral, resp. rate 15, last menstrual period 03/01/2018, SpO2 100 %. 1. General:  in No Acute distress  well  -appearing 2. Psychological: Alert and  Oriented 3. Head/ENT:   Dry Mucous Membranes                          Head Non traumatic, neck supple                         Poor Dentition                         Pale conjunctiva and mucosa 4. SKIN: decreased Skin turgor,  Skin clean Dry and intact no rash 5. Heart: Regular rate and rhythm no  Murmur, no Rub or gallop 6. Lungs:  no wheezes or crackles   7. Abdomen: Soft,  non-tender, Non distended  obese  bowel sounds present 8. Lower extremities: no clubbing, cyanosis, or edema 9. Neurologically Grossly intact, moving all 4 extremities equally   10. MSK: Normal range of motion   LABS:     Recent Labs  Lab 03/08/18 1405 03/09/18 1905  WBC 6.0 7.3  NEUTROABS 3.0 PENDING  HGB 5.4* 5.2*  HCT 20.9* 20.5*  MCV 60* 61.7*  PLT  --  352   Basic Metabolic Panel: Recent Labs  Lab 03/08/18 1405 03/09/18 1905  NA 143 141  K 4.2 3.5  CL 105 108  CO2 22 25  GLUCOSE 70 76  BUN 7 8  CREATININE 0.63 0.58  CALCIUM 8.9 8.7*      Recent  Labs  Lab 03/08/18 1405  AST 30  ALT 22  ALKPHOS 63  BILITOT <0.2  PROT 6.9  ALBUMIN 4.3   No results for input(s): LIPASE, AMYLASE in the last 168 hours. No results for input(s): AMMONIA in the last 168 hours.    HbA1C: Recent Labs    03/08/18 1405  HGBA1C 5.0   CBG: No results for input(s): GLUCAP in the last 168 hours.    Urine analysis:    Component Value Date/Time   COLORURINE AMBER (A) 04/19/2015 1930   APPEARANCEUR CLEAR 04/19/2015 1930   LABSPEC 1.020 04/19/2015 1930   PHURINE 6.0 04/19/2015 1930   GLUCOSEU NEGATIVE 04/19/2015 1930   HGBUR NEGATIVE 04/19/2015 1930   BILIRUBINUR SMALL (A) 04/19/2015 1930   KETONESUR NEGATIVE 04/19/2015 1930    PROTEINUR NEGATIVE 04/19/2015 1930   UROBILINOGEN 1.0 04/19/2015 1930   NITRITE NEGATIVE 04/19/2015 1930   LEUKOCYTESUR NEGATIVE 04/19/2015 1930      Cultures:    Component Value Date/Time   SDES THROAT 09/04/2015 1835   SPECREQUEST NONE Reflexed from [id number] 09/04/2015 1835   CULT  09/04/2015 1835    FEW STREPTOCOCCUS,BETA HEMOLYTIC NOT GROUP A Performed at [facility]    REPTSTATUS 09/06/2015 FINAL 09/04/2015 1835     Radiological Exams on Admission: No results found.  Chart has been reviewed    Assessment/Plan  37 y.o. female with medical history significant of anemia, GERD.  Migraine, hypertension, tobacco abuse   Admitted for symptomatic anemia  Present on Admission: . Symptomatic anemia - - Most likely cause of Anemia heavy menses -Obtain anemia panel Iron deficiency noted B 12 is pending - Hemoccult stool 3 - TSH WNL - If evidence of iron deficiency anemia or Hemoccult positive stools will need further GI workup - Obtain type and screen,  Transfuse for hemoglobin below 7  And patient being significantly symptomatic    . GERD - stable cont home medications . TOBACCO DEPENDENCE - down to 4 cig a day  - Spoke about importance of quitting spent 5 minutes discussing options for treatment, prior attempts at quitting, and dangers of smoking  -At this point patient is  interested in quitting  - order nicotine patch   - nursing tobacco cessation protocol . Borderline hypertension - Currently stable cont to monitor  Thrush-  continue nystatin swish and swallow and fluconazole p.o.  Discussed with patient use of inhaled steroid and need for washing out her mouth when she is using them  Other plan as per orders.  DVT prophylaxis:  SCD    Code Status:  FULL CODE as per patient  I had personally discussed CODE STATUS with patient    Family Communication:   Family not  at  Bedside    Disposition Plan:      To home once workup is complete and patient is  stable                                        Consults called: none  Admission status:     Obs    Level of care      medical floor            Therisa Doyne 03/09/2018, 10:13 PM    [facility]  Pager [phone]   after 2 AM please page floor coverage PA If 7AM-7PM, please contact the day team taking care of the patient  [email or web address]  Password [email or web address]

## 2018-03-09 NOTE — ED Provider Notes (Signed)
[facility]-EMERGENCY DEPT Provider Note   CSN: [id number] Arrival date & time: 03/09/18  1553     History   Chief Complaint Chief Complaint  Patient presents with  . abnormal labs    [name] is a 37 y.o. female with a history as outlined below including chronic anemia in addition to sickle cell trait, presenting with profound anemia with a hemoglobin of 5.4. She was seen by her new provider yesterday and screening labs performed with the above result.  She was directed here for admission.   She denies chest pain but does endorse generalized fatigue and shortness of breath with exertion which has been worsened recently.  She does endorse having to undergo iron infusions in the past.  She denies unusual bleeding or bruising, no bloody stools,  but does have regular but heavy periods, most recently last week, 5 days duration.    The history is provided by the patient.    Past Medical History:  Diagnosis Date  . Anemia   . Arthritis   . Bronchitis   . Depression    no meds, fine right now  . GERD (gastroesophageal reflux disease)   . Heart murmur   . Hypertension    "havent been taking my medicine in awhile"  . Insomnia   . Insomnia   . Migraines   . Pancreatitis   . Perimenopausal   . PID (acute pelvic inflammatory disease)   . Pregnancy induced hypertension   . Preterm labor   . PTSD (post-traumatic stress disorder)   . Seasonal allergies     Patient Active Problem List   Diagnosis Date Noted  . Symptomatic anemia 03/09/2018  . Gastritis 03/03/2014  . Bilateral wrist pain 03/03/2014  . Borderline hypertension 03/03/2014  . Cholecystitis 08/27/2013  . TWIN GESTATION 04/28/2010  . GERD 03/07/2008  . ANEMIA, IRON DEFICIENCY, UNSPEC. 08/17/2006  . DEPRESSION, MAJOR, RECURRENT 08/17/2006  . TOBACCO DEPENDENCE 08/17/2006  . MIGRAINE, UNSPEC., W/O INTRACTABLE MIGRAINE 08/17/2006  . INSOMNIA NOS 08/17/2006    Past Surgical History:    Procedure Laterality Date  . CESAREAN SECTION    . CHOLECYSTECTOMY N/A 08/27/2013   Procedure: LAPAROSCOPIC CHOLECYSTECTOMY WITH INTRAOPERATIVE CHOLANGIOGRAM;  Surgeon: Valarie Merino, MD;  Location: [facility] ORS;  Service: General;  Laterality: N/A;  . DILATION AND CURETTAGE OF UTERUS    . OVARY SURGERY    . TUBAL LIGATION       OB History    Gravida  9   Para  4   Term  1   Preterm  3   AB  5   Living  4     SAB  5   TAB      Ectopic      Multiple      Live Births  4            Home Medications    Prior to Admission medications   Medication Sig Start Date End Date Taking? Authorizing Provider  diphenhydrAMINE (BENADRYL) 25 MG tablet Take 25 mg by mouth every 6 (six) hours as needed for allergies.   Yes [provider]  fluconazole (DIFLUCAN) 150 MG tablet Take 150 mg by mouth once.   Yes [provider]  ibuprofen (ADVIL,MOTRIN) 600 MG tablet Take 600 mg by mouth every 6 (six) hours as needed for moderate pain.    Yes [provider]  methocarbamol (ROBAXIN) 500 MG tablet Take 1 tablet (500 mg total) by mouth  2 (two) times daily. Patient taking differently: Take 500 mg by mouth 2 (two) times daily as needed for muscle spasms.  02/23/18  Yes [place] Scrape, Alyssa B, PA-C  ranitidine (ZANTAC) 150 MG tablet Take 150 mg by mouth 2 (two) times daily as needed for heartburn.    Yes [provider]  lidocaine (XYLOCAINE) 2 % solution Use as directed 15 mLs in the mouth or throat as needed for mouth pain. Swish and spit.  Do not swallow. Patient not taking: Reported on 03/09/2018 02/23/18   Aviva Kluver B, PA-C  nystatin (MYCOSTATIN) 100000 UNIT/ML suspension Take 5 mLs (500,000 Units total) by mouth 4 (four) times daily for 14 days. 03/08/18 03/22/18  Mike Gip, FNP  phenylephrine-shark liver oil-mineral oil-petrolatum (PREPARATION H) 0.25-3-14-71.9 % rectal ointment Place 1 application rectally 2 (two) times daily as needed for hemorrhoids.     [provider]    Family History Family History  Problem Relation Age of Onset  . Hypertension Father   . Liver disease Father   . Stroke Father   . Cancer Mother        colon  . Arthritis Mother   . Diabetes Paternal Grandmother   . Hypertension Paternal Grandmother   . Heart disease Paternal Grandmother     Social History Social History   Tobacco Use  . Smoking status: Light Tobacco Smoker    Packs/day: 0.25    Years: 12.00    Pack years: 3.00  . Smokeless tobacco: Never Used  Substance Use Topics  . Alcohol use: No  . Drug use: Yes    Types: Marijuana    Comment: 4-5 times a week     Allergies   Aspirin; Hydromorphone hcl; Aloe vera; Dust mite extract; Morphine; and Shrimp [shellfish allergy]   Review of Systems Review of Systems  Constitutional: Negative for fever.  HENT: Negative for congestion and sore throat.   Eyes: Negative.   Respiratory: Positive for shortness of breath. Negative for chest tightness.   Cardiovascular: Negative for chest pain.  Gastrointestinal: Negative for abdominal pain and nausea.  Genitourinary: Negative.   Musculoskeletal: Negative for arthralgias, joint swelling and neck pain.  Skin: Negative.  Negative for rash and wound.  Neurological: Negative for dizziness, weakness, light-headedness, numbness and headaches.  Psychiatric/Behavioral: Negative.      Physical Exam Updated Vital Signs BP (!) 144/90   Pulse 82   Temp 99.3 F (37.4 C) (Oral)   Resp 15   LMP 03/01/2018   SpO2 100%   Physical Exam  Constitutional: She appears well-developed and well-nourished.  HENT:  Head: Normocephalic and atraumatic.  Eyes: Conjunctivae are normal.  Pale conjunctiva.  Neck: Normal range of motion.  Cardiovascular: Normal rate, regular rhythm, normal heart sounds and intact distal pulses.  Pulmonary/Chest: Effort normal and breath sounds normal. She has no wheezes.  Abdominal: Soft. Bowel sounds are normal. There is no  tenderness.  Musculoskeletal: Normal range of motion.  Neurological: She is alert.  Skin: Skin is warm and dry.  Psychiatric: She has a normal mood and affect.  Nursing note and vitals reviewed.    ED Treatments / Results  Labs (all labs ordered are listed, but only abnormal results are displayed) Labs Reviewed  CBC WITH DIFFERENTIAL/PLATELET - Abnormal; Notable for the following components:      Result Value   RBC 3.32 (*)    Hemoglobin 5.2 (*)    HCT 20.5 (*)    MCV 61.7 (*)    MCH 15.7 (*)  MCHC 25.4 (*)    RDW 20.8 (*)    All other components within normal limits  BASIC METABOLIC PANEL - Abnormal; Notable for the following components:   Calcium 8.7 (*)    All other components within normal limits  PROTIME-INR  VITAMIN B12  FOLATE  OCCULT BLOOD X 1 CARD TO LAB, STOOL  POC URINE PREG, ED  PREPARE RBC (CROSSMATCH)    EKG None  Radiology No results found.  Procedures Procedures (including critical care time)  Medications Ordered in ED Medications  0.9 %  sodium chloride infusion (has no administration in time range)  potassium chloride SA (K-DUR,KLOR-CON) CR tablet 40 mEq (has no administration in time range)     Initial Impression / Assessment and Plan / ED Course  I have reviewed the triage vital signs and the nursing notes.  Pertinent labs & imaging results that were available during my care of the patient were reviewed by me and considered in my medical decision making (see chart for details).     Pt with symptomatic anemia.  Discussed with Dr. Adela Glimpseoutova with hospitalist group will admit pt. she was typed and crossed for 2 units of PRBCs.  Final Clinical Impressions(s) / ED Diagnoses   Final diagnoses:  Microcytic anemia    ED Discharge Orders    None       Victoriano Laindol, Velton Roselle, PA-C 03/09/18 2135    Rolan BuccoBelfi, Melanie, MD 03/10/18 [phone]

## 2018-03-09 NOTE — ED Notes (Signed)
Pt had sent to lab: Pink tube, gold tube, blue tube, light green, dark green, and purple tube.

## 2018-03-10 DIAGNOSIS — K219 Gastro-esophageal reflux disease without esophagitis: Secondary | ICD-10-CM

## 2018-03-10 LAB — ABO/RH: ABO/RH(D): O NEG

## 2018-03-10 LAB — COMPREHENSIVE METABOLIC PANEL
ALT: 108 U/L — ABNORMAL HIGH (ref 0–44)
ANION GAP: 7 (ref 5–15)
AST: 151 U/L — ABNORMAL HIGH (ref 15–41)
Albumin: 4 g/dL (ref 3.5–5.0)
Alkaline Phosphatase: 76 U/L (ref 38–126)
BUN: 12 mg/dL (ref 6–20)
CHLORIDE: 108 mmol/L (ref 98–111)
CO2: 26 mmol/L (ref 22–32)
Calcium: 9.1 mg/dL (ref 8.9–10.3)
Creatinine, Ser: 0.75 mg/dL (ref 0.44–1.00)
Glucose, Bld: 93 mg/dL (ref 70–99)
Potassium: 4.3 mmol/L (ref 3.5–5.1)
Sodium: 141 mmol/L (ref 135–145)
Total Bilirubin: 0.5 mg/dL (ref 0.3–1.2)
Total Protein: 7.3 g/dL (ref 6.5–8.1)

## 2018-03-10 LAB — PREPARE RBC (CROSSMATCH)

## 2018-03-10 LAB — PHOSPHORUS: PHOSPHORUS: 3.6 mg/dL (ref 2.5–4.6)

## 2018-03-10 LAB — MAGNESIUM: MAGNESIUM: 2.2 mg/dL (ref 1.7–2.4)

## 2018-03-10 MED ORDER — FERROUS SULFATE 325 (65 FE) MG PO TABS
325.0000 mg | ORAL_TABLET | Freq: Two times a day (BID) | ORAL | Status: DC
  Administered 2018-03-10: 325 mg via ORAL
  Filled 2018-03-10: qty 1

## 2018-03-10 MED ORDER — AMLODIPINE BESYLATE 5 MG PO TABS
5.0000 mg | ORAL_TABLET | Freq: Every day | ORAL | Status: DC
  Administered 2018-03-10: 5 mg via ORAL
  Filled 2018-03-10: qty 1

## 2018-03-10 MED ORDER — POLYETHYLENE GLYCOL 3350 17 G PO PACK: 17.0000 g | PACK | Freq: Every day | ORAL | 0 refills | Status: DC | PRN

## 2018-03-10 MED ORDER — SENNOSIDES-DOCUSATE SODIUM 8.6-50 MG PO TABS: 1.0000 | ORAL_TABLET | Freq: Every evening | ORAL | Status: DC | PRN

## 2018-03-10 MED ORDER — FERROUS SULFATE 325 (65 FE) MG PO TABS: 325.0000 mg | ORAL_TABLET | Freq: Two times a day (BID) | ORAL | 1 refills | Status: DC

## 2018-03-10 MED ORDER — SENNOSIDES-DOCUSATE SODIUM 8.6-50 MG PO TABS: 1.0000 | ORAL_TABLET | Freq: Every evening | ORAL | 0 refills | Status: AC | PRN

## 2018-03-10 MED ORDER — AMLODIPINE BESYLATE 5 MG PO TABS: 5.0000 mg | ORAL_TABLET | Freq: Every day | ORAL | 0 refills | Status: DC

## 2018-03-10 MED ORDER — POLYETHYLENE GLYCOL 3350 17 G PO PACK: 17.0000 g | PACK | Freq: Every day | ORAL | Status: DC | PRN
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
## 2018-03-11 LAB — CBC
HEMATOCRIT: 28.5 % — AB (ref 36.0–46.0)
Hemoglobin: 8.2 g/dL — ABNORMAL LOW (ref 12.0–15.0)
MCH: 19.8 pg — ABNORMAL LOW (ref 26.0–34.0)
MCHC: 28.8 g/dL — ABNORMAL LOW (ref 30.0–36.0)
MCV: 68.8 fL — AB (ref 78.0–100.0)
Platelets: 312 10*3/uL (ref 150–400)
RBC: 4.14 MIL/uL (ref 3.87–5.11)
RDW: 25.8 % — ABNORMAL HIGH (ref 11.5–15.5)
WBC: 7.6 10*3/uL (ref 4.0–10.5)

## 2018-03-11 LAB — TYPE AND SCREEN
ABO/RH(D): O NEG
Antibody Screen: NEGATIVE
UNIT DIVISION: 0
Unit division: 0

## 2018-03-11 LAB — BPAM RBC
BLOOD PRODUCT EXPIRATION DATE: 201910172359
BLOOD PRODUCT EXPIRATION DATE: 201910232359
ISSUE DATE / TIME: 201909210417
ISSUE DATE / TIME: 201909210115
Unit Type and Rh: 9500
Unit Type and Rh: 9500

## 2018-03-12 LAB — OCCULT BLOOD X 1 CARD TO LAB, STOOL: Fecal Occult Bld: NEGATIVE

## 2018-03-22 ENCOUNTER — Ambulatory Visit: Payer: Self-pay | Admitting: Family Medicine

## 2018-03-31 DIAGNOSIS — G8929 Other chronic pain: Secondary | ICD-10-CM

## 2018-03-31 DIAGNOSIS — M5440 Lumbago with sciatica, unspecified side: Principal | ICD-10-CM

## 2018-04-02 MED ORDER — METHOCARBAMOL 500 MG PO TABS: 500.0000 mg | ORAL_TABLET | Freq: Three times a day (TID) | ORAL | 0 refills | Status: AC | PRN

## 2018-04-02 MED ORDER — IBUPROFEN 600 MG PO TABS: 600.0000 mg | ORAL_TABLET | Freq: Four times a day (QID) | ORAL | 0 refills | Status: DC | PRN

## 2018-04-02 NOTE — Telephone Encounter (Signed)
Refilled. Please take the ibuprofen with food.

## 2018-04-10 ENCOUNTER — Telehealth: Payer: Self-pay

## 2018-04-10 NOTE — Telephone Encounter (Signed)
Called and spoke with patient, she will keep appointment on 04/12/2018. Thanks!

## 2018-04-12 ENCOUNTER — Ambulatory Visit (INDEPENDENT_AMBULATORY_CARE_PROVIDER_SITE_OTHER): Payer: Self-pay | Admitting: Family Medicine

## 2018-04-12 VITALS — BP 152/83 | HR 90 | Temp 98.6°F | Resp 16 | Ht 61.0 in | Wt 130.0 lb

## 2018-04-12 DIAGNOSIS — M5441 Lumbago with sciatica, right side: Secondary | ICD-10-CM

## 2018-04-12 MED ORDER — TRAMADOL HCL 50 MG PO TABS: 50.0000 mg | ORAL_TABLET | Freq: Three times a day (TID) | ORAL | 0 refills | Status: DC | PRN

## 2018-04-12 MED ORDER — METHYLPREDNISOLONE SODIUM SUCC 125 MG IJ SOLR
125.0000 mg | Freq: Once | INTRAMUSCULAR | Status: AC
  Administered 2018-04-12: 125 mg via INTRAMUSCULAR

## 2018-04-12 NOTE — Patient Instructions (Signed)
Back Exercises If you have pain in your back, do these exercises 2-3 times each day or as told by your doctor. When the pain goes away, do the exercises once each day, but repeat the steps more times for each exercise (do more repetitions). If you do not have pain in your back, do these exercises once each day or as told by your doctor. Exercises Single Knee to Chest  Do these steps 3-5 times in a row for each leg: 1. Lie on your back on a firm bed or the floor with your legs stretched out. 2. Bring one knee to your chest. 3. Hold your knee to your chest by grabbing your knee or thigh. 4. Pull on your knee until you feel a gentle stretch in your lower back. 5. Keep doing the stretch for 10-30 seconds. 6. Slowly let go of your leg and straighten it.  Pelvic Tilt  Do these steps 5-10 times in a row: 1. Lie on your back on a firm bed or the floor with your legs stretched out. 2. Bend your knees so they point up to the ceiling. Your feet should be flat on the floor. 3. Tighten your lower belly (abdomen) muscles to press your lower back against the floor. This will make your tailbone point up to the ceiling instead of pointing down to your feet or the floor. 4. Stay in this position for 5-10 seconds while you gently tighten your muscles and breathe evenly.  Cat-Cow  Do these steps until your lower back bends more easily: 1. Get on your hands and knees on a firm surface. Keep your hands under your shoulders, and keep your knees under your hips. You may put padding under your knees. 2. Let your head hang down, and make your tailbone point down to the floor so your lower back is round like the back of a cat. 3. Stay in this position for 5 seconds. 4. Slowly lift your head and make your tailbone point up to the ceiling so your back hangs low (sags) like the back of a cow. 5. Stay in this position for 5 seconds.  Press-Ups  Do these steps 5-10 times in a row: 1. Lie on your belly (face-down)  on the floor. 2. Place your hands near your head, about shoulder-width apart. 3. While you keep your back relaxed and keep your hips on the floor, slowly straighten your arms to raise the top half of your body and lift your shoulders. Do not use your back muscles. To make yourself more comfortable, you may change where you place your hands. 4. Stay in this position for 5 seconds. 5. Slowly return to lying flat on the floor.  Bridges  Do these steps 10 times in a row: 1. Lie on your back on a firm surface. 2. Bend your knees so they point up to the ceiling. Your feet should be flat on the floor. 3. Tighten your butt muscles and lift your butt off of the floor until your waist is almost as high as your knees. If you do not feel the muscles working in your butt and the back of your thighs, slide your feet 1-2 inches farther away from your butt. 4. Stay in this position for 3-5 seconds. 5. Slowly lower your butt to the floor, and let your butt muscles relax.  If this exercise is too easy, try doing it with your arms crossed over your chest. Belly Crunches  Do these steps 5-10 times in   a row: 1. Lie on your back on a firm bed or the floor with your legs stretched out. 2. Bend your knees so they point up to the ceiling. Your feet should be flat on the floor. 3. Cross your arms over your chest. 4. Tip your chin a little bit toward your chest but do not bend your neck. 5. Tighten your belly muscles and slowly raise your chest just enough to lift your shoulder blades a tiny bit off of the floor. 6. Slowly lower your chest and your head to the floor.  Back Lifts Do these steps 5-10 times in a row: 1. Lie on your belly (face-down) with your arms at your sides, and rest your forehead on the floor. 2. Tighten the muscles in your legs and your butt. 3. Slowly lift your chest off of the floor while you keep your hips on the floor. Keep the back of your head in line with the curve in your back. Look at  the floor while you do this. 4. Stay in this position for 3-5 seconds. 5. Slowly lower your chest and your face to the floor.  Contact a doctor if:  Your back pain gets a lot worse when you do an exercise.  Your back pain does not lessen 2 hours after you exercise. If you have any of these problems, stop doing the exercises. Do not do them again unless your doctor says it is okay. Get help right away if:  You have sudden, very bad back pain. If this happens, stop doing the exercises. Do not do them again unless your doctor says it is okay. This information is not intended to replace advice given to you by your health care provider. Make sure you discuss any questions you have with your health care provider. Document Released: 07/09/2010 Document Revised: 11/12/2015 Document Reviewed: 07/31/2014 Elsevier Interactive Patient Education  2018 [employer]  Back Pain, Adult Back pain is very common. The pain often gets better over time. The cause of back pain is usually not dangerous. Most people can learn to manage their back pain on their own. Follow these instructions at home: Watch your back pain for any changes. The following actions may help to lessen any pain you are feeling:  Stay active. Start with short walks on flat ground if you can. Try to walk farther each day.  Exercise regularly as told by your doctor. Exercise helps your back heal faster. It also helps avoid future injury by keeping your muscles strong and flexible.  Do not sit, drive, or stand in one place for more than 30 minutes.  Do not stay in bed. Resting more than 1-2 days can slow down your recovery.  Be careful when you bend or lift an object. Use good form when lifting: ? Bend at your knees. ? Keep the object close to your body. ? Do not twist.  Sleep on a firm mattress. Lie on your side, and bend your knees. If you lie on your back, put a pillow under your knees.  Take medicines only as told by your  doctor.  Put ice on the injured area. ? Put ice in a plastic bag. ? Place a towel between your skin and the bag. ? Leave the ice on for 20 minutes, 2-3 times a day for the first 2-3 days. After that, you can switch between ice and heat packs.  Avoid feeling anxious or stressed. Find good ways to deal with stress, such as exercise.    Maintain a healthy weight. Extra weight puts stress on your back.  Contact a doctor if:  You have pain that does not go away with rest or medicine.  You have worsening pain that goes down into your legs or buttocks.  You have pain that does not get better in one week.  You have pain at night.  You lose weight.  You have a fever or chills. Get help right away if:  You cannot control when you poop (bowel movement) or pee (urinate).  Your arms or legs feel weak.  Your arms or legs lose feeling (numbness).  You feel sick to your stomach (nauseous) or throw up (vomit).  You have belly (abdominal) pain.  You feel like you may pass out (faint). This information is not intended to replace advice given to you by your health care provider. Make sure you discuss any questions you have with your health care provider. Document Released: 11/23/2007 Document Revised: 11/12/2015 Document Reviewed: 10/08/2013 Elsevier Interactive Patient Education  2018 Elsevier Inc.  

## 2018-04-12 NOTE — Progress Notes (Signed)
  [facility]   Progress Note: General Provider: Mike Gip, FNP  SUBJECTIVE:   SRIYA KROEZE is a 37 y.o. female who  has a past medical history of Anemia, Arthritis, Bronchitis, Depression, GERD (gastroesophageal reflux disease), Heart murmur, Hypertension, Insomnia, Insomnia, Migraines, Pancreatitis, Perimenopausal, PID (acute pelvic inflammatory disease), Pregnancy induced hypertension, Preterm labor, PTSD (post-traumatic stress disorder), and Seasonal allergies.. Patient presents today for Gynecologic Exam; Back Pain; Spasms (muscle spasms ); and Facial Swelling Patient states that she has a hx of CVA and has been seen previously by a chiropractor. Patient states that she was seen on 02/23/2018 in the ED for back pain. Patient states that she has a slipped disc and scoliosis.  lumbar spine IMPRESSION: 1. No fracture, spondylolisthesis or acute finding. 2. Mild levoscoliosis.  Minimal degenerative change.  Sacral Spine:   IMPRESSION: Negative.  Review of Systems  Genitourinary:       No bowel or bladder incontinence  Musculoskeletal: Positive for back pain.     OBJECTIVE: BP (!) 152/83 (BP Location: Right Arm, Patient Position: Sitting, Cuff Size: Normal)   Pulse 90   Temp 98.6 F (37 C) (Oral)   Resp 16   Ht 5\' 1"  (1.549 m)   Wt 130 lb (59 kg)   LMP 03/23/2018   SpO2 100%   BMI 24.56 kg/m   Physical Exam  Constitutional: She appears well-developed and well-nourished. No distress.  Cardiovascular: Normal rate, regular rhythm and normal heart sounds.  Pulmonary/Chest: Effort normal and breath sounds normal.  Musculoskeletal:       Lumbar back: She exhibits decreased range of motion, tenderness, bony tenderness and spasm. She exhibits no edema.  Psychiatric: She has a normal mood and affect. Her behavior is normal. Judgment and thought content normal.  Nursing note and vitals reviewed.   ASSESSMENT/PLAN:  1.  Acute right-sided low back pain with right-sided sciatica Can take tramadol with ibu and robaxin. Solu medrol given in office today. Will return for pap smear.  - traMADol (ULTRAM) 50 MG tablet; Take 1 tablet (50 mg total) by mouth every 8 (eight) hours as needed.  Dispense: 30 tablet; Refill: 0 - methylPREDNISolone sodium succinate (SOLU-MEDROL) 125 mg/2 mL injection 125 mg      The patient was given clear instructions to go to ER or return to medical center if symptoms do not improve, worsen or new problems develop. The patient verbalized understanding and agreed with plan of care.   [name]. Riley Lam, FNP-BC [facility] [address] [place], [place] [address] [phone]     This note has been created with Dragon speech recognition software and smart phrase technology. Any transcriptional errors are unintentional.

## 2018-04-20 ENCOUNTER — Other Ambulatory Visit: Payer: Self-pay

## 2018-04-20 DIAGNOSIS — G8929 Other chronic pain: Secondary | ICD-10-CM

## 2018-04-20 DIAGNOSIS — M5440 Lumbago with sciatica, unspecified side: Principal | ICD-10-CM

## 2018-04-23 ENCOUNTER — Other Ambulatory Visit: Payer: Self-pay

## 2018-04-23 MED ORDER — FERROUS SULFATE 325 (65 FE) MG PO TABS: 325.0000 mg | ORAL_TABLET | Freq: Two times a day (BID) | ORAL | 1 refills | Status: DC

## 2018-04-23 MED ORDER — IBUPROFEN 600 MG PO TABS: 600.0000 mg | ORAL_TABLET | Freq: Four times a day (QID) | ORAL | 0 refills | Status: DC | PRN

## 2018-04-23 MED ORDER — AMLODIPINE BESYLATE 5 MG PO TABS: 5.0000 mg | ORAL_TABLET | Freq: Every day | ORAL | 0 refills | Status: DC

## 2018-05-04 ENCOUNTER — Ambulatory Visit: Payer: Self-pay | Admitting: Family Medicine

## 2018-05-05 ENCOUNTER — Encounter: Payer: Self-pay | Admitting: Family Medicine

## 2018-05-07 ENCOUNTER — Ambulatory Visit: Payer: Self-pay | Admitting: Family Medicine

## 2018-05-14 ENCOUNTER — Encounter: Payer: Self-pay | Admitting: Family Medicine

## 2018-05-14 ENCOUNTER — Ambulatory Visit (INDEPENDENT_AMBULATORY_CARE_PROVIDER_SITE_OTHER): Payer: Self-pay | Admitting: Family Medicine

## 2018-05-14 VITALS — BP 148/92 | HR 91 | Temp 98.9°F | Resp 16 | Ht 61.0 in | Wt 122.0 lb

## 2018-05-14 DIAGNOSIS — M5440 Lumbago with sciatica, unspecified side: Secondary | ICD-10-CM

## 2018-05-14 DIAGNOSIS — Z01419 Encounter for gynecological examination (general) (routine) without abnormal findings: Secondary | ICD-10-CM

## 2018-05-14 DIAGNOSIS — J209 Acute bronchitis, unspecified: Secondary | ICD-10-CM

## 2018-05-14 DIAGNOSIS — G8929 Other chronic pain: Secondary | ICD-10-CM

## 2018-05-14 DIAGNOSIS — F17200 Nicotine dependence, unspecified, uncomplicated: Secondary | ICD-10-CM

## 2018-05-14 DIAGNOSIS — Z23 Encounter for immunization: Secondary | ICD-10-CM

## 2018-05-14 MED ORDER — ALBUTEROL SULFATE (2.5 MG/3ML) 0.083% IN NEBU: 2.5000 mg | INHALATION_SOLUTION | Freq: Four times a day (QID) | RESPIRATORY_TRACT | 1 refills | Status: DC | PRN

## 2018-05-14 MED ORDER — IBUPROFEN 600 MG PO TABS: 600.0000 mg | ORAL_TABLET | Freq: Four times a day (QID) | ORAL | 0 refills | Status: DC | PRN

## 2018-05-14 MED ORDER — PREDNISONE 20 MG PO TABS: 40.0000 mg | ORAL_TABLET | Freq: Every day | ORAL | 0 refills | Status: AC

## 2018-05-14 MED ORDER — ACETAMINOPHEN-CODEINE #3 300-30 MG PO TABS: 1.0000 | ORAL_TABLET | Freq: Three times a day (TID) | ORAL | 0 refills | Status: AC | PRN

## 2018-05-14 NOTE — Progress Notes (Signed)
[facility]  Annual GYN Examination Provider: Mike GipAndre Myrlene Riera, FNP   SUBJECTIVE: Kayla Dunn is a 37 y.o. female who presents for her annual gynecological examination.  Current concerns: Patient states that she has had wheezing and cough x 3 weeks. + fever at home. Not measured. Patient states that she has a neb machine and is requesting albuterol for home use.  She states that she has been weaning herself off of her tramadol and would like to have another pain medication.     GYNECOLOGICAL HISTORY: Patient's last menstrual period was 04/29/2018. Contraception: none Last Pap: unknown . Results were: normal Last mammogram: none. Results were: n/a  OBSTETRIC HISTORY: OB History  Gravida Para Term Preterm AB Living  9 4 1 3 5 4   SAB TAB Ectopic Multiple Live Births  5       4    # Outcome Date GA Lbr Len/2nd Weight Sex Delivery Anes PTL Lv  9 Preterm 10/09/10    F CS-Unspec  Y LIV     Birth Comments: twin gest, lost one around 12-13wks; BP elevation, border gest dm;   8 Preterm 02/24/07   5 lb 13 oz (2.637 kg) F Vag-Spont None Y LIV     Birth Comments: post partem hemmorage, went into shock  7 Preterm 01/28/02    M Vag-Spont EPI Y LIV     Birth Comments: GBS  6 Term 09/06/97   7 lb 8 oz (3.402 kg) F CS-Unspec EPI N LIV     Birth Comments: "young, couldn't get her out"; pre-eclampsia  5 SAB           4 SAB           3 SAB           2 SAB           1 SAB              The following portions of the patient's history were reviewed and updated as appropriate: allergies, current medications, past family history, past medical history, past social history, past surgical history and problem list.  REVIEW OF SYSTEMS: Review of Systems  Constitutional: Negative.   HENT: Negative.   Eyes: Negative.   Respiratory: Positive for cough, sputum production, shortness of breath and wheezing. Negative for hemoptysis.   Cardiovascular:  Negative.   Gastrointestinal: Negative.   Genitourinary: Negative.   Musculoskeletal: Negative.   Skin: Negative.   Neurological: Negative.   Psychiatric/Behavioral: Negative.       OBJECTIVE:  Physical Exam  Constitutional: She is oriented to person, place, and time. She appears well-developed and well-nourished. No distress.  Genitourinary: Vagina normal and uterus normal. No vaginal discharge found.  HENT:  Head: Normocephalic and atraumatic.  Eyes: Pupils are equal, round, and reactive to light. Conjunctivae and EOM are normal.  Neck: Normal range of motion.  Cardiovascular: Normal rate and regular rhythm.  Pulmonary/Chest: Effort normal. No respiratory distress. She has wheezes.  Abdominal: Soft. Bowel sounds are normal.  Musculoskeletal: Normal range of motion.  Neurological: She is alert and oriented to person, place, and time.  Skin: Skin is warm and dry.  Psychiatric: She has a normal mood and affect. Her behavior is normal. Judgment and thought content normal.  Nursing note and vitals reviewed.    ASSESSMENT/PLAN:  1. Well woman exam with routine gynecological exam Pap smear obtained today in office.  - Pap IG, CT/NG NAA, and  HPV (high risk) [facility]  2. Chronic low back pain with sciatica, sciatica laterality unspecified, unspecified back pain laterality Patient requesting a different pain medication. Will do short trial of tylenol 3. Can take with ibuprofen.  - ibuprofen (ADVIL,MOTRIN) 600 MG tablet; Take 1 tablet (600 mg total) by mouth every 6 (six) hours as needed for moderate pain.  Dispense: 30 tablet; Refill: 0 - acetaminophen-codeine (TYLENOL #3) 300-30 MG tablet; Take 1 tablet by mouth every 8 (eight) hours as needed for up to 10 days for moderate pain or severe pain (with ibuprofen).  Dispense: 30 tablet; Refill: 0  3. Acute bronchitis, unspecified organism Increase fluids and take otc mucinex to thin mucus.  - predniSONE (DELTASONE) 20 MG tablet;  Take 2 tablets (40 mg total) by mouth daily with breakfast for 5 days.  Dispense: 10 tablet; Refill: 0 - albuterol (PROVENTIL) (2.5 MG/3ML) 0.083% nebulizer solution; Take 3 mLs (2.5 mg total) by nebulization every 6 (six) hours as needed for wheezing or shortness of breath.  Dispense: 150 mL; Refill: 1   4. Need for immunization against influenza Influenza vaccination given in the office visit today.  - HM INFLUENZA SHOT - Flu Vaccine QUAD 36+ mos IM  5. TOBACCO DEPENDENCE The patient was counseled on the dangers of tobacco use, and was advised to quit and reluctant to quit.  Reviewed strategies to maximize success, including stress management, substitution of other forms of reinforcement and support of family/friends. Patient is not interested in quitting.   Education reviewed: depression evaluation, low fat, low cholesterol diet, safe sex/STD prevention and self breast exams. Contraception: condoms. Follow up in: 6 months.    Return to care as scheduled and prn. Patient verbalized understanding and agreed with plan of care.   Ms. Kayla Dunn. Riley Lam, FNP-BC [facility] [address] [place], [place] [address] [phone]

## 2018-05-14 NOTE — Patient Instructions (Signed)

## 2018-05-16 ENCOUNTER — Other Ambulatory Visit: Payer: Self-pay

## 2018-05-16 MED ORDER — FERROUS SULFATE 325 (65 FE) MG PO TABS: 325.0000 mg | ORAL_TABLET | Freq: Two times a day (BID) | ORAL | 1 refills | Status: DC

## 2018-05-16 MED ORDER — AMLODIPINE BESYLATE 5 MG PO TABS: 5.0000 mg | ORAL_TABLET | Freq: Every day | ORAL | 0 refills | Status: DC

## 2018-05-19 LAB — PAP IG, CT-NG NAA, HPV HIGH-RISK
Chlamydia, Nuc. Acid Amp: NEGATIVE
Gonococcus by Nucleic Acid Amp: NEGATIVE

## 2018-05-19 LAB — HPV, LOW VOLUME (REFLEX): HPV low volume reflex: POSITIVE — AB

## 2018-05-22 NOTE — Progress Notes (Signed)
This has been sent via my chart. Thanks!

## 2018-05-23 ENCOUNTER — Other Ambulatory Visit: Payer: Self-pay

## 2018-05-23 DIAGNOSIS — G8929 Other chronic pain: Secondary | ICD-10-CM

## 2018-05-23 DIAGNOSIS — M5440 Lumbago with sciatica, unspecified side: Principal | ICD-10-CM

## 2018-05-23 MED ORDER — AMLODIPINE BESYLATE 5 MG PO TABS: 5.0000 mg | ORAL_TABLET | Freq: Every day | ORAL | 0 refills | Status: DC

## 2018-05-23 MED ORDER — IBUPROFEN 600 MG PO TABS: 600.0000 mg | ORAL_TABLET | Freq: Four times a day (QID) | ORAL | 0 refills | Status: DC | PRN

## 2018-06-14 ENCOUNTER — Other Ambulatory Visit: Payer: Self-pay

## 2018-06-14 DIAGNOSIS — G8929 Other chronic pain: Secondary | ICD-10-CM

## 2018-06-14 DIAGNOSIS — J209 Acute bronchitis, unspecified: Secondary | ICD-10-CM

## 2018-06-14 DIAGNOSIS — M5440 Lumbago with sciatica, unspecified side: Principal | ICD-10-CM

## 2018-06-14 MED ORDER — ALBUTEROL SULFATE (2.5 MG/3ML) 0.083% IN NEBU: 2.5000 mg | INHALATION_SOLUTION | Freq: Four times a day (QID) | RESPIRATORY_TRACT | 1 refills | Status: DC | PRN

## 2018-06-14 MED ORDER — IBUPROFEN 600 MG PO TABS: 600.0000 mg | ORAL_TABLET | Freq: Four times a day (QID) | ORAL | 0 refills | Status: DC | PRN

## 2018-06-14 MED ORDER — FERROUS SULFATE 325 (65 FE) MG PO TABS: 325.0000 mg | ORAL_TABLET | Freq: Two times a day (BID) | ORAL | 1 refills | Status: DC

## 2018-06-14 MED ORDER — AMLODIPINE BESYLATE 5 MG PO TABS: 5.0000 mg | ORAL_TABLET | Freq: Every day | ORAL | 0 refills | Status: DC

## 2018-06-21 ENCOUNTER — Other Ambulatory Visit: Payer: Self-pay

## 2018-06-21 MED ORDER — FERROUS SULFATE 325 (65 FE) MG PO TABS: 325.0000 mg | ORAL_TABLET | Freq: Two times a day (BID) | ORAL | 3 refills | Status: DC

## 2018-06-21 MED ORDER — AMLODIPINE BESYLATE 5 MG PO TABS: 5.0000 mg | ORAL_TABLET | Freq: Every day | ORAL | 3 refills | Status: DC

## 2018-08-15 ENCOUNTER — Ambulatory Visit: Payer: Self-pay | Admitting: Family Medicine

## 2018-08-20 ENCOUNTER — Other Ambulatory Visit: Payer: Self-pay

## 2018-08-20 ENCOUNTER — Ambulatory Visit (INDEPENDENT_AMBULATORY_CARE_PROVIDER_SITE_OTHER): Payer: Self-pay | Admitting: Family Medicine

## 2018-08-20 ENCOUNTER — Encounter: Payer: Self-pay | Admitting: Family Medicine

## 2018-08-20 VITALS — BP 145/92 | HR 89 | Temp 98.7°F | Resp 18 | Ht 61.0 in | Wt 134.8 lb

## 2018-08-20 DIAGNOSIS — B977 Papillomavirus as the cause of diseases classified elsewhere: Secondary | ICD-10-CM

## 2018-08-20 DIAGNOSIS — M5440 Lumbago with sciatica, unspecified side: Principal | ICD-10-CM

## 2018-08-20 DIAGNOSIS — G4700 Insomnia, unspecified: Secondary | ICD-10-CM

## 2018-08-20 DIAGNOSIS — F419 Anxiety disorder, unspecified: Secondary | ICD-10-CM

## 2018-08-20 DIAGNOSIS — F39 Unspecified mood [affective] disorder: Secondary | ICD-10-CM

## 2018-08-20 DIAGNOSIS — G8929 Other chronic pain: Secondary | ICD-10-CM

## 2018-08-20 MED ORDER — PROPRANOLOL HCL 20 MG PO TABS: 20.0000 mg | ORAL_TABLET | Freq: Two times a day (BID) | ORAL | 0 refills | Status: DC

## 2018-08-20 MED ORDER — IBUPROFEN 600 MG PO TABS: 600.0000 mg | ORAL_TABLET | Freq: Four times a day (QID) | ORAL | 0 refills | Status: DC | PRN

## 2018-08-20 MED ORDER — TRAZODONE HCL 50 MG PO TABS: 25.0000 mg | ORAL_TABLET | Freq: Every evening | ORAL | 3 refills | Status: DC | PRN

## 2018-08-20 MED ORDER — LAMOTRIGINE 25 MG PO TABS: 25.0000 mg | ORAL_TABLET | Freq: Every day | ORAL | 0 refills | Status: DC

## 2018-08-20 MED ORDER — AMLODIPINE BESYLATE 5 MG PO TABS: 5.0000 mg | ORAL_TABLET | Freq: Every day | ORAL | 3 refills | Status: DC

## 2018-08-20 MED ORDER — HYDROXYZINE HCL 10 MG PO TABS: 10.0000 mg | ORAL_TABLET | Freq: Three times a day (TID) | ORAL | 0 refills | Status: DC | PRN

## 2018-08-20 NOTE — Progress Notes (Signed)
[facility]   Progress Note: General Provider: Mike Gip, FNP  SUBJECTIVE:   Kayla Dunn is a 38 y.o. female who  has a past medical history of  Depression, GERD (gastroesophageal reflux disease), Heart murmur, Hypertension, Insomnia,, Pancreatitis,  PTSD (post-traumatic stress disorder), and Seasonal allergies.. Patient presents today for Follow-up (pap smear follow up ); Eye Problem (itching watering eyes); and Depression (severe symptoms score 21) . Patient reports a history of PTSD and states that she has a history of bipolar disorder.  She states that she has had periods of time where she did not sleep and was not tired, spent money frivolously.  She states that these time periods are typically followed by deep depression. She states that she currently is experiencing having a sad mood, avolition, isolation, and mood swings x 3 weeks. She denies suicidal/homicidal ideations, intent or plan. She denies delusional thinking or psychosis.  Review of Systems  Constitutional: Negative.   HENT: Negative.   Eyes: Negative.   Respiratory: Negative.   Cardiovascular: Negative.   Gastrointestinal: Negative.   Genitourinary: Negative.   Musculoskeletal: Negative.   Skin: Negative.   Neurological: Negative.   Psychiatric/Behavioral: Positive for depression. Negative for hallucinations, memory loss, substance abuse and suicidal ideas. The patient is nervous/anxious and has insomnia.      OBJECTIVE: BP (!) 145/92 (BP Location: Left Arm, Patient Position: Sitting, Cuff Size: Normal)   Pulse 89   Temp 98.7 F (37.1 C) (Oral)   Resp 18   Ht 5\' 1"  (1.549 m)   Wt 134 lb 12.8 oz (61.1 kg)   SpO2 95%   BMI 25.47 kg/m   Wt Readings from Last 3 Encounters:  08/20/18 134 lb 12.8 oz (61.1 kg)  05/14/18 122 lb (55.3 kg)  04/12/18 130 lb (59 kg)     Physical Exam Vitals signs and nursing note reviewed.  Constitutional:      General: She  is not in acute distress.    Appearance: She is well-developed.  HENT:     Head: Normocephalic and atraumatic.  Eyes:     Conjunctiva/sclera: Conjunctivae normal.     Pupils: Pupils are equal, round, and reactive to light.  Neck:     Musculoskeletal: Normal range of motion.  Cardiovascular:     Rate and Rhythm: Normal rate and regular rhythm.     Heart sounds: Normal heart sounds.  Pulmonary:     Effort: Pulmonary effort is normal. No respiratory distress.     Breath sounds: Normal breath sounds.  Abdominal:     General: Bowel sounds are normal. There is no distension.     Palpations: Abdomen is soft.  Musculoskeletal: Normal range of motion.  Skin:    General: Skin is warm and dry.  Neurological:     Mental Status: She is alert and oriented to person, place, and time.  Psychiatric:        Attention and Perception: Attention normal.        Mood and Affect: Mood is anxious and depressed.        Speech: Speech normal.        Behavior: Behavior normal. Behavior is cooperative.        Thought Content: Thought content normal.        Cognition and Memory: Cognition normal.        Judgment: Judgment normal.     ASSESSMENT/PLAN:  1. Mood disorder (HCC) Discussed major side effects of medication to  include serious rash. Instructed that if rash appears, D/C medication and go immediately to the ED.  - lamoTRIgine (LAMICTAL) 25 MG tablet; Take 1 tablet (25 mg total) by mouth daily. Take 1 tab po x 2 weeks, then increase to 50mg  thereafter.  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 Patient states that she has hx of colposcopy.   3. Insomnia, unspecified type Discussed sleep hygiene - traZODone (DESYREL) 50 MG tablet; Take 0.5-1 tablets (25-50 mg total) by mouth at bedtime as needed for sleep.  Dispense: 30 tablet; Refill: 3  4. Anxiety Discussed counseling through [facility].  - hydrOXYzine (ATARAX/VISTARIL) 10 MG  tablet; Take 1 tablet (10 mg total) by mouth every 8 (eight) hours as needed for anxiety.  Dispense: 90 tablet; Refill: 0     Return in about 3 weeks (around 09/10/2018) for mood.   Time Spent: 25 minutes face-to-face with this patient discussing problems, treatments, and answering patient's questions.   The patient was given clear instructions to go to ER or return to medical center if symptoms do not improve, worsen or new problems develop. The patient verbalized understanding and agreed with plan of care.   [name]. Riley Lam, FNP-BC [facility] [address] [place], [place] [address] [phone]

## 2018-08-20 NOTE — Patient Instructions (Addendum)
Hydroxyzine capsules or tablets What is this medicine? HYDROXYZINE (hye DROX i zeen) is an antihistamine. This medicine is used to treat allergy symptoms. It is also used to treat anxiety and tension. This medicine can be used with other medicines to induce sleep before surgery. This medicine may be used for other purposes; ask your health care provider or pharmacist if you have questions. COMMON BRAND NAME(S): ANX, Atarax, Rezine, Vistaril What should I tell my health care provider before I take this medicine? They need to know if you have any of these conditions: -glaucoma -heart disease -history of irregular heartbeat -kidney disease -liver disease -lung or breathing disease, like asthma -stomach or intestine problems -thyroid disease -trouble passing urine -an unusual or allergic reaction to hydroxyzine, cetirizine, other medicines, foods, dyes or preservatives -pregnant or trying to get pregnant -breast-feeding How should I use this medicine? Take this medicine by mouth with a full glass of water. Follow the directions on the prescription label. You may take this medicine with food or on an empty stomach. Take your medicine at regular intervals. Do not take your medicine more often than directed. Talk to your pediatrician regarding the use of this medicine in children. Special care may be needed. While this drug may be prescribed for children as young as 6 years of age for selected conditions, precautions do apply. Patients over 65 years old may have a stronger reaction and need a smaller dose. Overdosage: If you think you have taken too much of this medicine contact a poison control center or emergency room at once. NOTE: This medicine is only for you. Do not share this medicine with others. What if I miss a dose? If you miss a dose, take it as soon as you can. If it is almost time for your next dose, take only that dose. Do not take double or extra doses. What may interact with this  medicine? Do not take this medicine with any of the following medications: -cisapride -dofetilide -dronedarone -pimozide -thioridazine This medicine may also interact with the following medications: -alcohol -antihistamines for allergy, cough, and cold -atropine -barbiturate medicines for sleep or seizures, like phenobarbital -certain antibiotics like erythromycin or clarithromycin -certain medicines for anxiety or sleep -certain medicines for bladder problems like oxybutynin, tolterodine -certain medicines for depression or psychotic disturbances -certain medicines for irregular heart beat -certain medicines for Parkinson's disease like benztropine, trihexyphenidyl -certain medicines for seizures like phenobarbital, primidone -certain medicines for stomach problems like dicyclomine, hyoscyamine -certain medicines for travel sickness like scopolamine -ipratropium -narcotic medicines for pain -other medicines that prolong the QT interval (an abnormal heart rhythm) This list may not describe all possible interactions. Give your health care provider a list of all the medicines, herbs, non-prescription drugs, or dietary supplements you use. Also tell them if you smoke, drink alcohol, or use illegal drugs. Some items may interact with your medicine. What should I watch for while using this medicine? Tell your doctor or health care professional if your symptoms do not improve. You may get drowsy or dizzy. Do not drive, use machinery, or do anything that needs mental alertness until you know how this medicine affects you. Do not stand or sit up quickly, especially if you are an older patient. This reduces the risk of dizzy or fainting spells. Alcohol may interfere with the effect of this medicine. Avoid alcoholic drinks. Your mouth may get dry. Chewing sugarless gum or sucking hard candy, and drinking plenty of water may help. Contact your doctor if   the problem does not go away or is  severe. This medicine may cause dry eyes and blurred vision. If you wear contact lenses you may feel some discomfort. Lubricating drops may help. See your eye doctor if the problem does not go away or is severe. If you are receiving skin tests for allergies, tell your doctor you are using this medicine. What side effects may I notice from receiving this medicine? Side effects that you should report to your doctor or health care professional as soon as possible: -allergic reactions like skin rash, itching or hives, swelling of the face, lips, or tongue -changes in vision -confusion -fast, irregular heartbeat -seizures -tremor -trouble passing urine or change in the amount of urine Side effects that usually do not require medical attention (report to your doctor or health care professional if they continue or are bothersome): -constipation -drowsiness -dry mouth -headache -tiredness This list may not describe all possible side effects. Call your doctor for medical advice about side effects. You may report side effects to FDA at 1-800-FDA-1088. Where should I keep my medicine? Keep out of the reach of children. Store at room temperature between 15 and 30 degrees C (59 and 86 degrees F). Keep container tightly closed. Throw away any unused medicine after the expiration date. NOTE: This sheet is a summary. It may not cover all possible information. If you have questions about this medicine, talk to your doctor, pharmacist, or health care provider.  2019 Elsevier/Gold Standard (2017-12-18 13:25:13) Trazodone tablets What is this medicine? TRAZODONE (TRAZ oh done) is used to treat depression. This medicine may be used for other purposes; ask your health care provider or pharmacist if you have questions. COMMON BRAND NAME(S): Desyrel What should I tell my health care provider before I take this medicine? They need to know if you have any of these conditions: -attempted suicide or thinking  about it -bipolar disorder -bleeding problems -glaucoma -heart disease, or previous heart attack -irregular heart beat -kidney or liver disease -low levels of sodium in the blood -an unusual or allergic reaction to trazodone, other medicines, foods, dyes or preservatives -pregnant or trying to get pregnant -breast-feeding How should I use this medicine? Take this medicine by mouth with a glass of water. Follow the directions on the prescription label. Take this medicine shortly after a meal or a light snack. Take your medicine at regular intervals. Do not take your medicine more often than directed. Do not stop taking this medicine suddenly except upon the advice of your doctor. Stopping this medicine too quickly may cause serious side effects or your condition may worsen. A special MedGuide will be given to you by the pharmacist with each prescription and refill. Be sure to read this information carefully each time. Talk to your pediatrician regarding the use of this medicine in children. Special care may be needed. Overdosage: If you think you have taken too much of this medicine contact a poison control center or emergency room at once. NOTE: This medicine is only for you. Do not share this medicine with others. What if I miss a dose? If you miss a dose, take it as soon as you can. If it is almost time for your next dose, take only that dose. Do not take double or extra doses. What may interact with this medicine? Do not take this medicine with any of the following medications: -certain medicines for fungal infections like fluconazole, itraconazole, ketoconazole, posaconazole, voriconazole -cisapride -dofetilide -dronedarone -linezolid -MAOIs like Carbex, Eldepryl,   Marplan, Nardil, and Parnate -mesoridazine -methylene blue (injected into a vein) -pimozide -saquinavir -thioridazine This medicine may also interact with the following medications: -alcohol -antiviral medicines for  HIV or AIDS -aspirin and aspirin-like medicines -barbiturates like phenobarbital -certain medicines for blood pressure, heart disease, irregular heart beat -certain medicines for depression, anxiety, or psychotic disturbances -certain medicines for migraine headache like almotriptan, eletriptan, frovatriptan, naratriptan, rizatriptan, sumatriptan, zolmitriptan -certain medicines for seizures like carbamazepine and phenytoin -certain medicines for sleep -certain medicines that treat or prevent blood clots like dalteparin, enoxaparin, warfarin -digoxin -fentanyl -lithium -NSAIDS, medicines for pain and inflammation, like ibuprofen or naproxen -other medicines that prolong the QT interval (cause an abnormal heart rhythm) -rasagiline -supplements like St. John's wort, kava kava, valerian -tramadol -tryptophan This list may not describe all possible interactions. Give your health care provider a list of all the medicines, herbs, non-prescription drugs, or dietary supplements you use. Also tell them if you smoke, drink alcohol, or use illegal drugs. Some items may interact with your medicine. What should I watch for while using this medicine? Tell your doctor if your symptoms do not get better or if they get worse. Visit your doctor or health care professional for regular checks on your progress. Because it may take several weeks to see the full effects of this medicine, it is important to continue your treatment as prescribed by your doctor. Patients and their families should watch out for new or worsening thoughts of suicide or depression. Also watch out for sudden changes in feelings such as feeling anxious, agitated, panicky, irritable, hostile, aggressive, impulsive, severely restless, overly excited and hyperactive, or not being able to sleep. If this happens, especially at the beginning of treatment or after a change in dose, call your health care professional. Dennis Bast may get drowsy or dizzy. Do  not drive, use machinery, or do anything that needs mental alertness until you know how this medicine affects you. Do not stand or sit up quickly, especially if you are an older patient. This reduces the risk of dizzy or fainting spells. Alcohol may interfere with the effect of this medicine. Avoid alcoholic drinks. This medicine may cause dry eyes and blurred vision. If you wear contact lenses you may feel some discomfort. Lubricating drops may help. See your eye doctor if the problem does not go away or is severe. Your mouth may get dry. Chewing sugarless gum, sucking hard candy and drinking plenty of water may help. Contact your doctor if the problem does not go away or is severe. What side effects may I notice from receiving this medicine? Side effects that you should report to your doctor or health care professional as soon as possible: -allergic reactions like skin rash, itching or hives, swelling of the face, lips, or tongue -elevated mood, decreased need for sleep, racing thoughts, impulsive behavior -confusion -fast, irregular heartbeat -feeling faint or lightheaded, falls -feeling agitated, angry, or irritable -loss of balance or coordination -painful or prolonged erections -restlessness, pacing, inability to keep still -suicidal thoughts or other mood changes -tremors -trouble sleeping -seizures -unusual bleeding or bruising Side effects that usually do not require medical attention (report to your doctor or health care professional if they continue or are bothersome): -change in sex drive or performance -change in appetite or weight -constipation -headache -muscle aches or pains -nausea This list may not describe all possible side effects. Call your doctor for medical advice about side effects. You may report side effects to FDA at 1-800-FDA-1088. Where should I  keep my medicine? Keep out of the reach of children. Store at room temperature between 15 and 30 degrees C (59 to  86 degrees F). Protect from light. Keep container tightly closed. Throw away any unused medicine after the expiration date. NOTE: This sheet is a summary. It may not cover all possible information. If you have questions about this medicine, talk to your doctor, pharmacist, or health care provider.  2019 Elsevier/Gold Standard (2017-08-15 17:51:24) Lamotrigine tablets What is this medicine? LAMOTRIGINE (la MOE Patrecia Pace) is used to control seizures in adults and children with epilepsy and Lennox-Gastaut syndrome. It is also used in adults to treat bipolar disorder. This medicine may be used for other purposes; ask your health care provider or pharmacist if you have questions. COMMON BRAND NAME(S): Lamictal, Subvenite What should I tell my health care provider before I take this medicine? They need to know if you have any of these conditions: -aseptic meningitis during prior use of lamotrigine -depression -folate deficiency -kidney disease -liver disease -suicidal thoughts, plans, or attempt; a previous suicide attempt by you or a family member -an unusual or allergic reaction to lamotrigine or other seizure medications, other medicines, foods, dyes, or preservatives -pregnant or trying to get pregnant -breast-feeding How should I use this medicine? Take this medicine by mouth with a glass of water. Follow the directions on the prescription label. Do not chew these tablets. If this medicine upsets your stomach, take it with food or milk. Take your doses at regular intervals. Do not take your medicine more often than directed. A special MedGuide will be given to you by the pharmacist with each new prescription and refill. Be sure to read this information carefully each time. Talk to your pediatrician regarding the use of this medicine in children. While this drug may be prescribed for children as young as 2 years for selected conditions, precautions do apply. Overdosage: If you think you have  taken too much of this medicine contact a poison control center or emergency room at once. NOTE: This medicine is only for you. Do not share this medicine with others. What if I miss a dose? If you miss a dose, take it as soon as you can. If it is almost time for your next dose, take only that dose. Do not take double or extra doses. What may interact with this medicine? -atazanavir -carbamazepine -female hormones, including contraceptive or birth control pills -lopinavir -methotrexate -phenobarbital -phenytoin -primidone -pyrimethamine -rifampin -ritonavir -trimethoprim -valproic acid This list may not describe all possible interactions. Give your health care provider a list of all the medicines, herbs, non-prescription drugs, or dietary supplements you use. Also tell them if you smoke, drink alcohol, or use illegal drugs. Some items may interact with your medicine. What should I watch for while using this medicine? Visit your doctor or health care professional for regular checks on your progress. If you take this medicine for seizures, wear a Medic Alert bracelet or necklace. Carry an identification card with information about your condition, medicines, and doctor or health care professional. It is important to take this medicine exactly as directed. When first starting treatment, your dose will need to be adjusted slowly. It may take weeks or months before your dose is stable. You should contact your doctor or health care professional if your seizures get worse or if you have any new types of seizures. Do not stop taking this medicine unless instructed by your doctor or health care professional. Stopping your medicine suddenly  can increase your seizures or their severity. Contact your doctor or health care professional right away if you develop a rash while taking this medicine. Rashes may be very severe and sometimes require treatment in the hospital. Deaths from rashes have occurred.  Serious rashes occur more often in children than adults taking this medicine. It is more common for these serious rashes to occur during the first 2 months of treatment, but a rash can occur at any time. You may get drowsy, dizzy, or have blurred vision. Do not drive, use machinery, or do anything that needs mental alertness until you know how this medicine affects you. To reduce dizzy or fainting spells, do not sit or stand up quickly, especially if you are an older patient. Alcohol can increase drowsiness and dizziness. Avoid alcoholic drinks. If you are taking this medicine for bipolar disorder, it is important to report any changes in your mood to your doctor or health care professional. If your condition gets worse, you get mentally depressed, feel very hyperactive or manic, have difficulty sleeping, or have thoughts of hurting yourself or committing suicide, you need to get help from your health care professional right away. If you are a caregiver for someone taking this medicine for bipolar disorder, you should also report these behavioral changes right away. The use of this medicine may increase the chance of suicidal thoughts or actions. Pay special attention to how you are responding while on this medicine. Your mouth may get dry. Chewing sugarless gum or sucking hard candy, and drinking plenty of water may help. Contact your doctor if the problem does not go away or is severe. Women who become pregnant while using this medicine may enroll in the [place] Antiepileptic Drug Pregnancy Registry by calling [phone]. This registry collects information about the safety of antiepileptic drug use during pregnancy. This medicine may cause a decrease in folic acid. You should make sure that you get enough folic acid while you are taking this medicine. Discuss the foods you eat and the vitamins you take with your health care professional. What side effects may I notice from receiving this  medicine? Side effects that you should report to your doctor or health care professional as soon as possible: -allergic reactions like skin rash, itching or hives, swelling of the face, lips, or tongue -changes in vision -depressed mood -elevated mood, decreased need for sleep, racing thoughts, impulsive behavior -fever with rash, swollen lymph nodes, or swelling of the face -loss of balance or coordination -mouth sores -redness, blistering, peeling or loosening of the skin, including inside the mouth -right upper belly pain -seizures -severe muscle pain -signs and symptoms of aseptic meningitis such as stiff neck and sensitivity to light, headache, drowsiness, fever, nausea, vomiting, rash -signs of infection - fever or chills, cough, sore throat, pain or difficulty passing urine -suicidal thoughts or other mood changes -swollen lymph nodes -trouble walking -unusual bruising or bleeding -unusually weak or tired -yellowing of the eyes or skin Side effects that usually do not require medical attention (report to your doctor or health care professional if they continue or are bothersome): -diarrhea -dizziness -dry mouth -stuffy nose -tiredness -tremors -trouble sleeping This list may not describe all possible side effects. Call your doctor for medical advice about side effects. You may report side effects to FDA at 1-800-FDA-1088. Where should I keep my medicine? Keep out of reach of children. Store at room temperature between 15 and 30 degrees C (59 and 86 degrees F). Throw  away any unused medicine after the expiration date. NOTE: This sheet is a summary. It may not cover all possible information. If you have questions about this medicine, talk to your doctor, pharmacist, or health care provider.  2019 Elsevier/Gold Standard (2017-01-23 16:07:39) Bipolar 2 Disorder Bipolar 2 disorder is a mental health disorder in which a person has episodes of emotional highs (mania) and lows  (depression). Bipolar 2 is different from other bipolar disorders because the manic episodes are not as high and do not last as long. This is called hypomania. People with bipolar 2 disorder usually go back and forth between hypomanic and depressive episodes. What are the causes? The cause of this condition is not known. What increases the risk? The following factors may make you more likely to develop this condition:  Having a family member with the disorder.  An imbalance of certain chemicals in the brain (neurotransmitters).  Stress, such as a death, illness, or financial problems.  Certain conditions that affect the brain or spinal cord (neurologic conditions).  Brain injury (trauma).  Having another mental health disorder, such as: ? Obsessive compulsive disorder. ? Schizophrenia. What are the signs or symptoms? Symptoms of hypomania include:  Very high self-esteem or self-confidence.  Decreased need for sleep.  Unusual talkativeness or feeling a need to keep talking. Speech may be very fast. It may seem like you cannot stop talking.  Racing thoughts or constant talking, with quick shifts between topics that may or may not be related (flight of ideas).  Decreased ability to focus or concentrate.  Increased purposeful activity, such as work, studies, or social activity.  Increased nonproductive activity. This could be pacing, squirming and fidgeting, or finger and toe tapping.  Impulsive behavior and poor judgment. This may result in high-risk activities, such as having unprotected sex or spending a lot of money. Symptoms of depression include:  Feeling sad, hopeless, or helpless.  Frequent or uncontrollable crying.  Lack of feeling or caring about anything.  Sleeping too much.  Moving more slowly than usual.  Not being able to enjoy things you used to enjoy.  Desire to be alone all the time.  Feeling guilty or worthless.  Lack of energy or  motivation.  Trouble concentrating or remembering.  Trouble making decisions.  Increased appetite.  Thoughts of death or desire to harm yourself. How is this diagnosed? To diagnose bipolar 2 disorder, your health care provider may ask about your:  Emotional episodes.  Medical history.  Alcohol and drug use. This includes prescription medicines. Certain medical conditions and substances can cause symptoms that seem like bipolar disorder (secondary bipolar disorder). How is this treated? Bipolar 2 disorder is a long-term (chronic) illness. It is best controlled with ongoing (continuous) treatment rather than being treated only when symptoms occur. Treatment may include:  Psychotherapy. Some forms of talk therapy, such as cognitive-behavioral therapy (CBT), can provide support, education, and guidance.  Coping strategies, such as journaling or relaxation exercises. Relaxation exercises include: ? Yoga. ? Meditation. ? Deep breathing.  Lifestyle changes, such as: ? Limiting alcohol and drug use. ? Exercising regularly. ? Getting plenty of sleep. ? Making healthy eating choices.  Medicine. Medicine can be prescribed by a health care provider who specializes in treating mental disorders (psychiatrist). ? Medicines called mood stabilizers are usually prescribed. ? If symptoms occur even while taking a mood stabilizer, other medicines may be added. A combination of medicine, talk therapy, and coping methods is the best way to treat this condition.  Follow these instructions at home: Activity  Return to your normal activities as told by your health care provider.  Find activities that you enjoy, and make time to do them.  Exercise regularly as told by your health care provider. Lifestyle  Limit alcohol intake to no more than 1 drink a day for nonpregnant women and 2 drinks a day for men. One drink equals 12 oz of beer, 5 oz of wine, or 1 oz of hard liquor.  Follow a set  schedule for eating and sleeping.  Eat a balanced diet that includes fresh fruits and vegetables, whole grains, low-fat dairy, and lean meats.  Get at least 7-8 hours of sleep each night. General instructions  Take over-the-counter and prescription medicines only as told by your health care provider.  Think about joining a support group. Your health care provider may be able to recommend a support group.  Talk with your family and loved ones about your treatment goals and how they can help.  Keep all follow-up visits as told by your health care provider. This is important. Where to find more information For more information about bipolar 2 disorder, visit the following websites:  [employer]ness: www.nami.org  [employer]: http://www.maynard.net/ Contact a health care provider if:  Your symptoms get worse.  You have side effects from your medicine, and they get worse.  You have trouble sleeping.  You have trouble doing daily activities.  You feel unsafe in your surroundings.  You are dealing with substance abuse. Get help right away if:  You have new symptoms.  You have thoughts about harming yourself or others.  You harm yourself. Summary  Bipolar 2 disorder is a mental health disorder in which a person has episodes of hypomania and depression.  Bipolar 2 is best treated through a combination of medicines, talk therapy, and coping strategies.  Talk with your family and loved ones about your treatment goals and how they can help. This information is not intended to replace advice given to you by your health care provider. Make sure you discuss any questions you have with your health care provider. Document Released: 07/12/2016 Document Revised: 07/12/2016 Document Reviewed: 07/12/2016 Elsevier Interactive Patient Education  2019 [employer].

## 2018-08-28 ENCOUNTER — Other Ambulatory Visit: Payer: Self-pay | Admitting: Family Medicine

## 2018-08-28 DIAGNOSIS — F419 Anxiety disorder, unspecified: Secondary | ICD-10-CM

## 2018-08-28 DIAGNOSIS — G4700 Insomnia, unspecified: Secondary | ICD-10-CM

## 2018-09-07 ENCOUNTER — Other Ambulatory Visit: Payer: Self-pay

## 2018-09-07 DIAGNOSIS — F419 Anxiety disorder, unspecified: Secondary | ICD-10-CM

## 2018-09-07 DIAGNOSIS — G4700 Insomnia, unspecified: Secondary | ICD-10-CM

## 2018-09-07 MED ORDER — TRAZODONE HCL 50 MG PO TABS: 25.0000 mg | ORAL_TABLET | Freq: Every evening | ORAL | 2 refills | Status: DC | PRN

## 2018-09-07 MED ORDER — HYDROXYZINE HCL 10 MG PO TABS: 10.0000 mg | ORAL_TABLET | Freq: Three times a day (TID) | ORAL | 1 refills | Status: DC | PRN

## 2018-09-10 ENCOUNTER — Ambulatory Visit: Payer: Self-pay | Admitting: Family Medicine

## 2018-09-15 ENCOUNTER — Other Ambulatory Visit: Payer: Self-pay | Admitting: Family Medicine

## 2018-09-18 ENCOUNTER — Other Ambulatory Visit: Payer: Self-pay

## 2018-09-18 MED ORDER — FERROUS SULFATE 325 (65 FE) MG PO TABS: 325.0000 mg | ORAL_TABLET | Freq: Two times a day (BID) | ORAL | 3 refills | Status: DC

## 2018-09-19 ENCOUNTER — Other Ambulatory Visit: Payer: Self-pay | Admitting: Family Medicine

## 2018-09-19 ENCOUNTER — Other Ambulatory Visit: Payer: Self-pay

## 2018-09-19 ENCOUNTER — Ambulatory Visit (INDEPENDENT_AMBULATORY_CARE_PROVIDER_SITE_OTHER): Payer: Self-pay | Admitting: Family Medicine

## 2018-09-19 ENCOUNTER — Encounter: Payer: Self-pay | Admitting: Family Medicine

## 2018-09-19 DIAGNOSIS — F39 Unspecified mood [affective] disorder: Secondary | ICD-10-CM

## 2018-09-19 MED ORDER — BUSPIRONE HCL 7.5 MG PO TABS: 7.5000 mg | ORAL_TABLET | Freq: Two times a day (BID) | ORAL | 2 refills | Status: DC

## 2018-09-19 MED ORDER — LAMOTRIGINE 100 MG PO TABS: 100.0000 mg | ORAL_TABLET | Freq: Every day | ORAL | 5 refills | Status: DC

## 2018-09-19 NOTE — Progress Notes (Signed)
  [facility]  Virtual Visit via Telephone Note  I connected with Kayla Dunn on 09/19/18 at 11:00 AM EDT by telephone and verified that I am speaking with the correct person using two identifiers.   I discussed the limitations, risks, security and privacy concerns of performing an evaluation and management service by telephone and the availability of in person appointments. I also discussed with the patient that there may be a patient responsible charge related to this service. The patient expressed understanding and agreed to proceed.   History of Present Illness: Patient states that she is doing well. She stopped propranolol due to having increased wheezing. We discussed the risks of having asthma with this medication in our last office visit. She states that vistaril is giving her headaches. lamictal is "ok". Not seeing a huge benefit.  She denies delusional thinking, psychosis, suicidal or homicidal ideation intent or plan.   She also states that she has a sore throat and nasal congestion that started this AM. History of seasonal allergies and has not taken any medications. Denies fever or cough.    Observations/Objective: Patient with regular voice tone, rate and rhythm. Speaking calmly and is in no apparent distress.     Assessment and Plan: 1. Mood disorder (HCC) Increased lamictal to 100mg  daily.. Start buspar 75 BID for anxiety.  - lamoTRIgine (LAMICTAL) 100 MG tablet; Take 1 tablet (100 mg total) by mouth daily.  Dispense: 30 tablet; Refill: 5 - busPIRone (BUSPAR) 7.5 MG tablet; Take 1 tablet (7.5 mg total) by mouth 2 (two) times daily.  Dispense: 60 tablet; Refill: 2   Follow Up Instructions:    I discussed the assessment and treatment plan with the patient. The patient was provided an opportunity to ask questions and all were answered. The patient agreed with the plan and demonstrated an understanding of the instructions.    The patient was advised to call back or seek an in-person evaluation if the symptoms worsen or if the condition fails to improve as anticipated. We discussed hand washing, using hand sanitizer when soap and water are not available, only going out when absolutely necessary, and social distancing. Explained to patient that she is immunocompromised and will need to take precautions during this time.   I provided 20 minutes of non-face-to-face time during this encounter.  Ms. Andr L. Riley Lam, FNP-BC [facility] [address] [place], [place] [address] [phone]

## 2018-10-03 ENCOUNTER — Ambulatory Visit (INDEPENDENT_AMBULATORY_CARE_PROVIDER_SITE_OTHER): Payer: Self-pay | Admitting: Family Medicine

## 2018-10-03 ENCOUNTER — Other Ambulatory Visit: Payer: Self-pay

## 2018-10-03 DIAGNOSIS — F39 Unspecified mood [affective] disorder: Secondary | ICD-10-CM

## 2018-10-03 DIAGNOSIS — M25571 Pain in right ankle and joints of right foot: Secondary | ICD-10-CM

## 2018-10-03 MED ORDER — PREDNISONE 10 MG (21) PO TBPK: ORAL_TABLET | ORAL | 0 refills | Status: DC

## 2018-10-03 MED ORDER — TOPIRAMATE 25 MG PO TABS: 25.0000 mg | ORAL_TABLET | Freq: Two times a day (BID) | ORAL | 2 refills | Status: DC

## 2018-10-03 NOTE — Progress Notes (Signed)
  [facility]  Virtual Visit via Telephone Note  I connected with Kayla Dunn on 10/03/18 at 10:20 AM EDT by telephone and verified that I am speaking with the correct person using two identifiers.   I discussed the limitations, risks, security and privacy concerns of performing an evaluation and management service by telephone and the availability of in person appointments. I also discussed with the patient that there may be a patient responsible charge related to this service. The patient expressed understanding and agreed to proceed.  History of Present Illness: Patient states that she is having pain in her right thumb and right great toe x 2 weeks. She denies a history of gout or injury. She also states that she discontinued her lamictal due to side effects. She states that she would like to change to another medication to help with her head aches and mood. She denies CP, SOB, dizziness or leg swelling.     Observations/Objective: Patient with regular voice tone, rate and rhythm. Speaking calmly and is in no apparent distress.     Assessment and Plan: 1. Arthralgia of right foot Possible gout. Will do trial of prednisone.  - predniSONE (STERAPRED UNI-PAK 21 TAB) 10 MG (21) TBPK tablet; Take as directed on pack  Dispense: 21 tablet; Refill: 0  2. Mood disorder (HCC) D/C lamictal. Will start topamax.  - topiramate (TOPAMAX) 25 MG tablet; Take 1 tablet (25 mg total) by mouth 2 (two) times daily. Start with 25 mg po QHS x 1 week. Then increase to BID after.  Dispense: 60 tablet; Refill: 2    Follow Up Instructions:   We discussed hand washing, using hand sanitizer when soap and water are not available, only going out when absolutely necessary, and social distancing. Explained to patient that she is immunocompromised and will need to take precautions during this time.   I discussed the assessment and treatment plan with the patient. The  patient was provided an opportunity to ask questions and all were answered. The patient agreed with the plan and demonstrated an understanding of the instructions.   The patient was advised to call back or seek an in-person evaluation if the symptoms worsen or if the condition fails to improve as anticipated.  I provided 15 minutes of non-face-to-face time during this encounter.  Ms. Andr L. Riley Lam, FNP-BC Patient Care Center [facility] [address] [place], [place] [address] [phone]

## 2018-10-08 NOTE — Telephone Encounter (Signed)
I can send in the liquid, but you are no longer supposed to be taking the propranolol which is what caused the asthma symptoms.

## 2018-11-19 ENCOUNTER — Other Ambulatory Visit: Payer: Self-pay | Admitting: Family Medicine

## 2018-12-20 ENCOUNTER — Other Ambulatory Visit: Payer: Self-pay | Admitting: Family Medicine

## 2019-02-27 ENCOUNTER — Encounter (HOSPITAL_COMMUNITY): Payer: Self-pay

## 2019-02-27 ENCOUNTER — Encounter (HOSPITAL_COMMUNITY): Payer: Self-pay | Admitting: *Deleted

## 2019-03-12 ENCOUNTER — Ambulatory Visit: Payer: Self-pay | Admitting: Family Medicine

## 2019-03-13 ENCOUNTER — Ambulatory Visit: Payer: Self-pay | Admitting: Family Medicine

## 2019-03-19 ENCOUNTER — Encounter: Payer: Self-pay | Admitting: Family Medicine

## 2019-06-28 DIAGNOSIS — U071 COVID-19: Secondary | ICD-10-CM

## 2019-06-28 HISTORY — DX: COVID-19: U07.1

## 2019-07-01 ENCOUNTER — Encounter: Payer: Self-pay | Admitting: Family Medicine

## 2019-08-26 ENCOUNTER — Encounter: Payer: Self-pay | Admitting: Family Medicine

## 2019-08-27 ENCOUNTER — Other Ambulatory Visit: Payer: Self-pay

## 2019-08-27 MED ORDER — FERROUS SULFATE 325 (65 FE) MG PO TABS: ORAL_TABLET | ORAL | 0 refills | Status: DC

## 2019-08-27 MED ORDER — ALBUTEROL SULFATE (2.5 MG/3ML) 0.083% IN NEBU: 2.5000 mg | INHALATION_SOLUTION | Freq: Four times a day (QID) | RESPIRATORY_TRACT | 0 refills | Status: DC | PRN

## 2021-05-06 ENCOUNTER — Emergency Department (HOSPITAL_COMMUNITY)
Admission: EM | Admit: 2021-05-06 | Discharge: 2021-05-06 | Disposition: A | Discharge status: Home / Self Care | Payer: Medicaid Other | Attending: Emergency Medicine | Admitting: Emergency Medicine

## 2021-05-06 ENCOUNTER — Encounter (HOSPITAL_COMMUNITY): Payer: Self-pay | Admitting: Oncology

## 2021-05-06 ENCOUNTER — Emergency Department (HOSPITAL_COMMUNITY): Payer: Medicaid Other

## 2021-05-06 ENCOUNTER — Other Ambulatory Visit: Payer: Self-pay

## 2021-05-06 DIAGNOSIS — R58 Hemorrhage, not elsewhere classified: Secondary | ICD-10-CM | POA: Insufficient documentation

## 2021-05-06 DIAGNOSIS — Z79899 Other long term (current) drug therapy: Secondary | ICD-10-CM | POA: Insufficient documentation

## 2021-05-06 DIAGNOSIS — M25532 Pain in left wrist: Secondary | ICD-10-CM | POA: Diagnosis not present

## 2021-05-06 DIAGNOSIS — F17210 Nicotine dependence, cigarettes, uncomplicated: Secondary | ICD-10-CM | POA: Insufficient documentation

## 2021-05-06 DIAGNOSIS — I10 Essential (primary) hypertension: Secondary | ICD-10-CM | POA: Insufficient documentation

## 2021-05-06 DIAGNOSIS — R233 Spontaneous ecchymoses: Secondary | ICD-10-CM | POA: Diagnosis not present

## 2021-05-06 IMAGING — CR DG WRIST COMPLETE 3+V*L*
4 series · 4 of 4 positions shown · non-contrast
Comparison: None.

CLINICAL DATA: Left wrist pain.

EXAM:
LEFT WRIST - COMPLETE 3+ VIEW

[x wrist pa left]
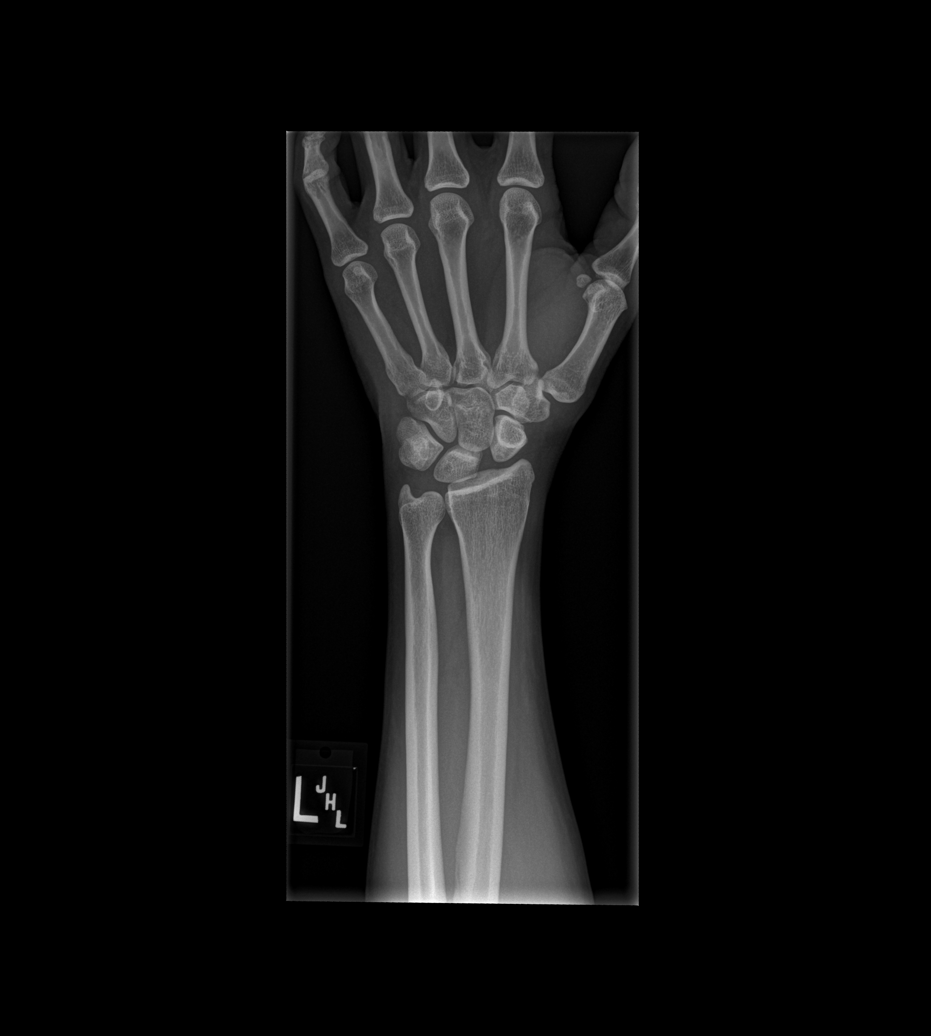

[x wrist obl left]
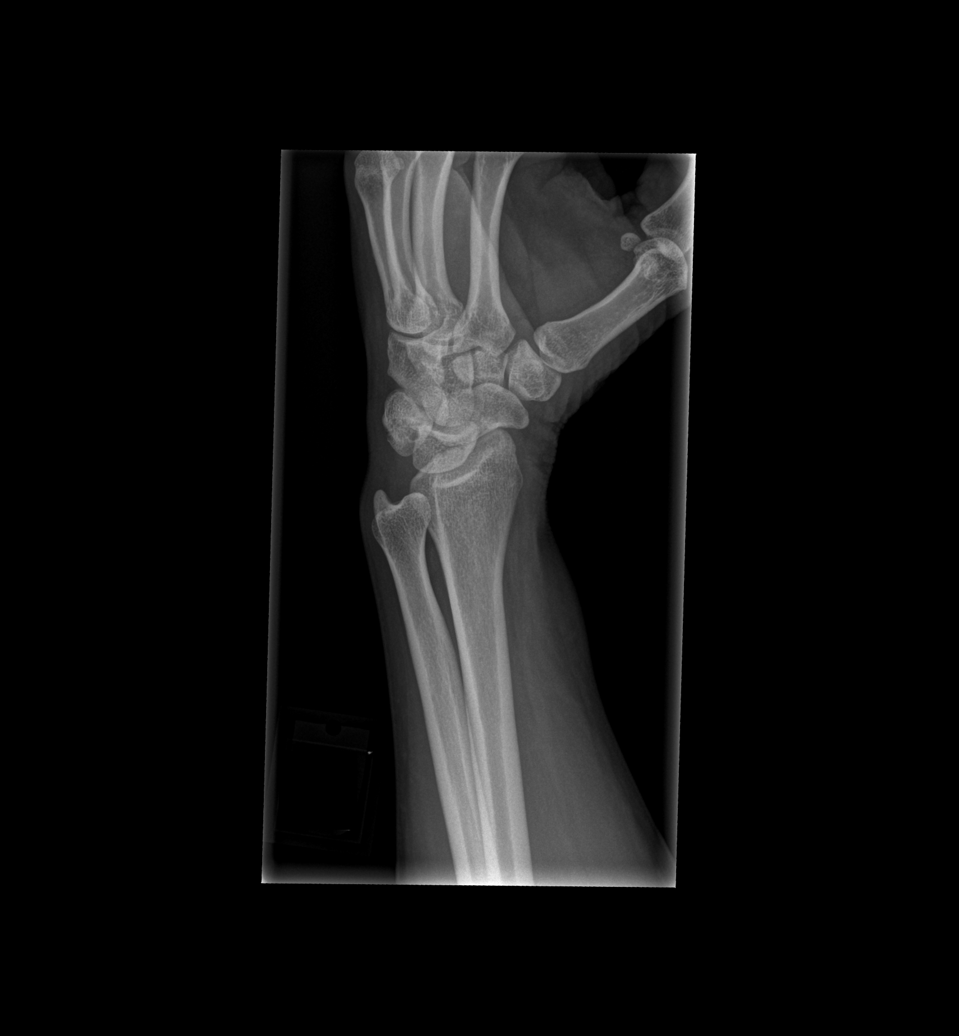

[x wrist lat left]
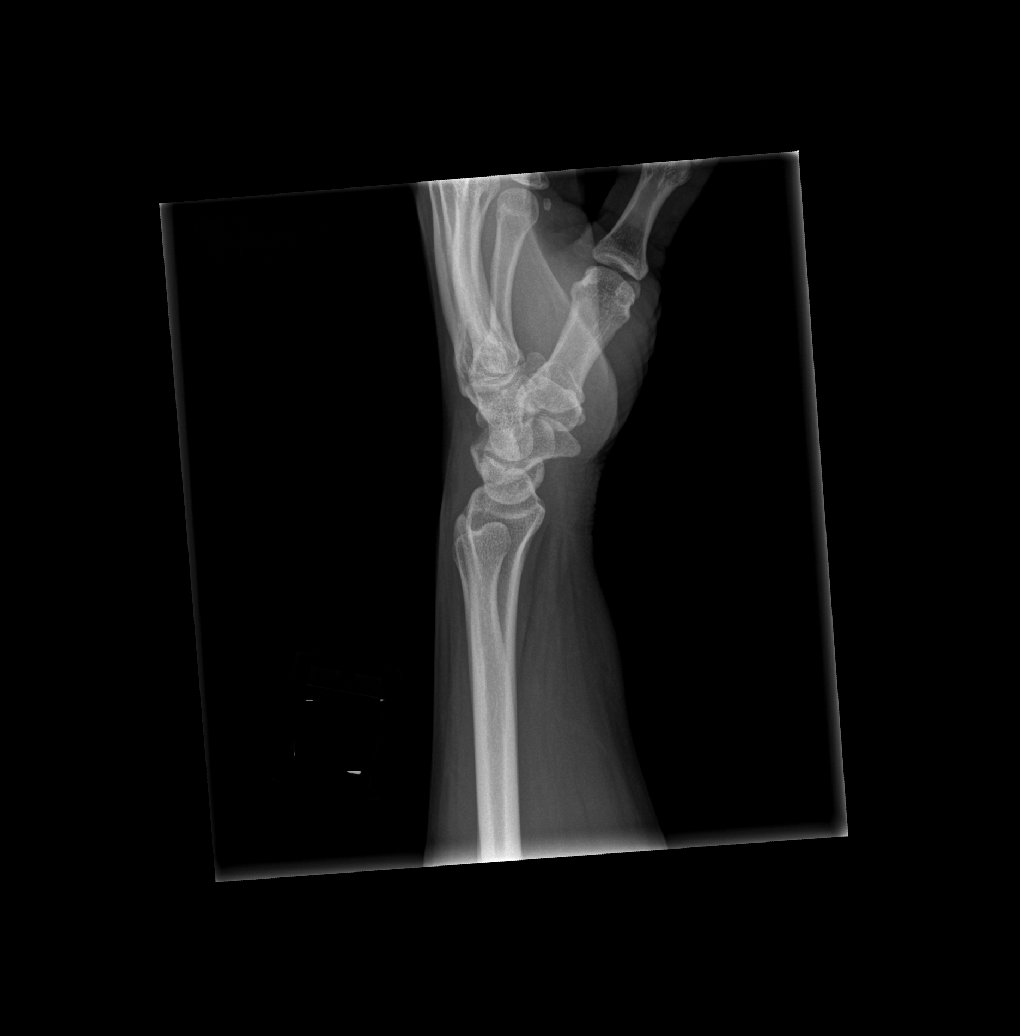

[x wrist navicular view left]
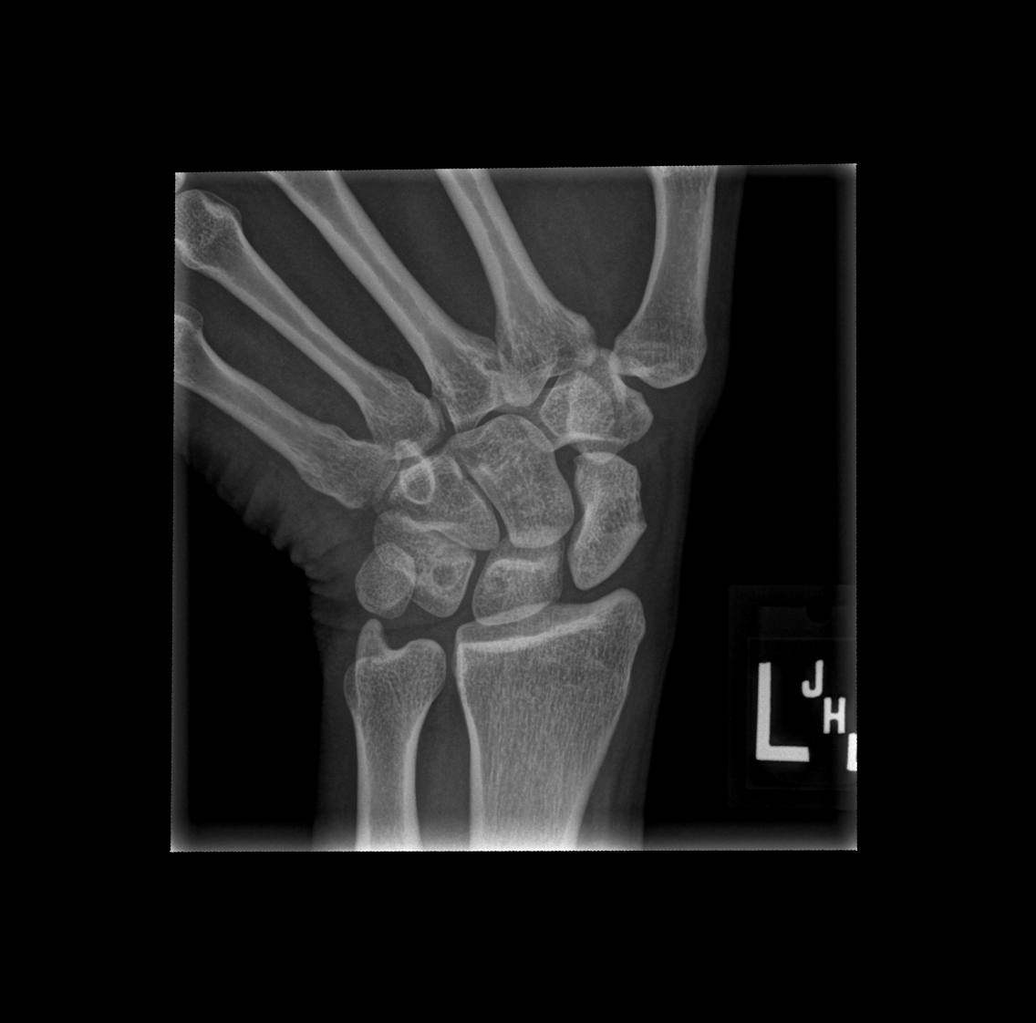

[4 of 4 positions shown; findings below may reference images not displayed]

FINDINGS: No acute fracture or dislocation. The bones are well mineralized. No
arthritic changes. Small cyst in the triquetrum. The soft tissues
are unremarkable.
IMPRESSION: Negative.

## 2021-05-06 NOTE — ED Provider Notes (Signed)
[facility]-EMERGENCY DEPT Provider Note   CSN: [id number] Arrival date & time: 05/06/21  1828     History Chief Complaint  Patient presents with   Wrist Pain    Kayla Dunn is a 40 y.o. female.  Kayla Dunn is a 40 y.o. female with a history of hypertension, migraines, pancreatitis, GERD, who presents for evaluation of left wrist pain.  She reports she woke up from a 5-hour nap and noticed a round swollen lump on her inner left wrist.  She reports it is tender to the touch.  She denies any known trauma or injury to this area and reports before she went to sleep it had not been present.  She reports that she tried applying ice but it did not feel any better and so she came to get it checked out.  Denies any pain radiating into the hand, no numbness or weakness.  No overlying redness.  Able to move the wrist.  No other aggravating or alleviating factors.  The history is provided by the patient.      Past Medical History:  Diagnosis Date   Anemia    Arthritis    Bronchitis    Depression    no meds, fine right now   GERD (gastroesophageal reflux disease)    Heart murmur    Hypertension    "havent been taking my medicine in awhile"   Insomnia    Insomnia    Migraines    Pancreatitis    Perimenopausal    PID (acute pelvic inflammatory disease)    Pregnancy induced hypertension    Preterm labor    PTSD (post-traumatic stress disorder)    Seasonal allergies     Patient Active Problem List   Diagnosis Date Noted   Microcytic anemia 03/09/2018   Thrush, oral 03/09/2018   Gastritis 03/03/2014   Bilateral wrist pain 03/03/2014   Borderline hypertension 03/03/2014   Cholecystitis 08/27/2013   TWIN GESTATION 04/28/2010   GERD 03/07/2008   ANEMIA, IRON DEFICIENCY, UNSPEC. 08/17/2006   DEPRESSION, MAJOR, RECURRENT 08/17/2006   TOBACCO DEPENDENCE 08/17/2006   MIGRAINE, UNSPEC., W/O INTRACTABLE MIGRAINE 08/17/2006   INSOMNIA NOS 08/17/2006     Past Surgical History:  Procedure Laterality Date   CESAREAN SECTION     CHOLECYSTECTOMY N/A 08/27/2013   Procedure: LAPAROSCOPIC CHOLECYSTECTOMY WITH INTRAOPERATIVE CHOLANGIOGRAM;  Surgeon: Valarie Merino, MD;  Location: [facility] ORS;  Service: General;  Laterality: N/A;   DILATION AND CURETTAGE OF UTERUS     OVARY SURGERY     TUBAL LIGATION       OB History     Gravida  9   Para  4   Term  1   Preterm  3   AB  5   Living  4      SAB  5   IAB      Ectopic      Multiple      Live Births  4           Family History  Problem Relation Age of Onset   Hypertension Father    Liver disease Father    Stroke Father    Cancer Mother        colon   Arthritis Mother    Diabetes Paternal Grandmother    Hypertension Paternal Grandmother    Heart disease Paternal Grandmother     Social History   Tobacco Use   Smoking status: Light Smoker    Packs/day:  0.25    Years: 12.00    Pack years: 3.00    Types: Cigarettes   Smokeless tobacco: Never  Vaping Use   Vaping Use: Never used  Substance Use Topics   Alcohol use: No   Drug use: Yes    Types: Marijuana    Comment: 4-5 times a week    Home Medications Prior to Admission medications   Medication Sig Start Date End Date Taking? Authorizing Provider  albuterol (PROVENTIL) (2.5 MG/3ML) 0.083% nebulizer solution Take 3 mLs (2.5 mg total) by nebulization every 6 (six) hours as needed for wheezing or shortness of breath. 08/27/19   Quentin Angst, MD  amLODipine (NORVASC) 5 MG tablet TAKE 1 TABLET BY MOUTH EVERY DAY 11/19/18   Mike Gip, FNP  busPIRone (BUSPAR) 7.5 MG tablet Take 1 tablet (7.5 mg total) by mouth 2 (two) times daily. 09/19/18   Mike Gip, FNP  diphenhydrAMINE (BENADRYL) 25 MG tablet Take 25 mg by mouth every 6 (six) hours as needed for allergies.    [provider]  ferrous sulfate 325 (65 FE) MG tablet TAKE 1 TABLET BY MOUTH 2 TIMES DAILY WITH A MEAL. 08/27/19   Quentin Angst, MD  ibuprofen (ADVIL,MOTRIN) 600 MG tablet Take 1 tablet (600 mg total) by mouth every 6 (six) hours as needed for moderate pain. 08/20/18   Mike Gip, FNP  predniSONE (STERAPRED UNI-PAK 21 TAB) 10 MG (21) TBPK tablet Take as directed on pack 10/03/18   Mike Gip, FNP  topiramate (TOPAMAX) 25 MG tablet Take 1 tablet (25 mg total) by mouth 2 (two) times daily. Start with 25 mg po QHS x 1 week. Then increase to BID after. 10/03/18   Mike Gip, FNP  traZODone (DESYREL) 50 MG tablet Take 0.5-1 tablets (25-50 mg total) by mouth at bedtime as needed for sleep. 09/07/18   Mike Gip, FNP    Allergies    Aspirin, Hydromorphone hcl, Aloe vera, Dust mite extract, Morphine, and Shrimp [shellfish allergy]  Review of Systems   Review of Systems  Constitutional:  Negative for chills and fever.  Musculoskeletal:  Positive for arthralgias.  Skin:  Positive for color change. Negative for rash and wound.  Neurological:  Negative for weakness and numbness.  All other systems reviewed and are negative.  Physical Exam Updated Vital Signs BP (!) 159/96 (BP Location: Right Arm)   Pulse 80   Temp 98.4 F (36.9 C) (Oral)   Resp 20   Ht 5' (1.524 m)   Wt 59 kg   LMP 04/30/2021 (Exact Date)   SpO2 100%   BMI 25.39 kg/m   Physical Exam Vitals and nursing note reviewed.  Constitutional:      General: She is not in acute distress.    Appearance: Normal appearance. She is well-developed. She is not ill-appearing or diaphoretic.  HENT:     Head: Normocephalic and atraumatic.  Eyes:     General:        Right eye: No discharge.        Left eye: No discharge.  Pulmonary:     Effort: Pulmonary effort is normal. No respiratory distress.  Musculoskeletal:     Comments: 2.5 cm circular raised area to the left inner wrist that is slightly tender to palpation, no overlying erythema, slight ecchymosis noted, 2+ radial and ulnar pulse and normal range of motion of the wrist.   Neurological:     Mental Status: She is alert and oriented to person, place, and  time.     Coordination: Coordination normal.  Psychiatric:        Mood and Affect: Mood normal.        Behavior: Behavior normal.    ED Results / Procedures / Treatments   Labs (all labs ordered are listed, but only abnormal results are displayed) Labs Reviewed - No data to display  EKG None  Radiology DG Wrist Complete Left  Result Date: 05/06/2021 CLINICAL DATA:  Left wrist pain. EXAM: LEFT WRIST - COMPLETE 3+ VIEW COMPARISON:  None. FINDINGS: No acute fracture or dislocation. The bones are well mineralized. No arthritic changes. Small cyst in the triquetrum. The soft tissues are unremarkable. IMPRESSION: Negative. Electronically Signed   By: Elgie Collard M.D.   On: 05/06/2021 19:47    Procedures Procedures   Medications Ordered in ED Medications - No data to display  ED Course  I have reviewed the triage vital signs and the nursing notes.  Pertinent labs & imaging results that were available during my care of the patient were reviewed by me and considered in my medical decision making (see chart for details).    MDM Rules/Calculators/A&P                           Left wrist with a 2.5 cm round area that appears to be an ecchymosis, there is no fluctuance or overlying erythema.  This does not lie directly over the joint and patient has full range of motion of the wrist without pain.  Distal pulses are 2+.  X-ray with no underlying bony deformity.  Suspect some soft tissue trauma or injury, also considered superficial thrombophlebitis.  Encourage patient to treat with Ace wrap, ice, heat, and NSAIDs and PCP follow-up.  Return precautions provided.  Discharged home in good condition.  Final Clinical Impression(s) / ED Diagnoses Final diagnoses:  Left wrist pain  Ecchymosis    Rx / DC Orders ED Discharge Orders     None        Legrand Rams 05/06/21 2039    Milagros Loll, MD 05/07/21 2024

## 2021-05-06 NOTE — ED Provider Notes (Signed)
Emergency Medicine Provider Triage Evaluation Note  Kayla Dunn , a 40 y.o. female  was evaluated in triage.  Pt complains of raised lump on her inner left wrist.  Patient reports she woke up from a nap and noticed this, reports it is sore to the touch.  She reports that it was not there before she took a nap and she denies any known trauma or injury.  Able to move the wrist.  No overlying redness  Review of Systems  Positive: Wrist pain, lump Negative: Numbness, weakness, redness, fever  Physical Exam  BP (!) 159/96 (BP Location: Right Arm)   Pulse 80   Temp 98.4 F (36.9 C) (Oral)   Resp 20   Ht 5' (1.524 m)   Wt 59 kg   LMP 04/30/2021 (Exact Date)   SpO2 100%   BMI 25.39 kg/m  Gen:   Awake, no distress   Resp:  Normal effort  MSK:   Moves extremities without difficulty  Other:  2.5 cm circular raised area to the left inner wrist that is slightly tender to palpation, no overlying erythema, slight ecchymosis noted, 2+ radial and ulnar pulse and normal range of motion of the wrist.  Medical Decision Making  Medically screening exam initiated at 7:07 PM.  Appropriate orders placed.  Kayla Dunn was informed that the remainder of the evaluation will be completed by another provider, this initial triage assessment does not replace that evaluation, and the importance of remaining in the ED until their evaluation is complete.     Dartha Lodge, PA-C 05/06/21 1909    Milagros Loll, MD 05/07/21 2024

## 2021-05-06 NOTE — Discharge Instructions (Signed)
It appears you have a bruise over your wrist, but x-rays look good, treat with Ace wrap, ice, heat, Motrin and Tylenol.  Follow-up with your PCP if not improving.  If you note significantly worsened swelling, overlying redness, increasing pain in the wrist, numbness or weakness, return for reevaluation.

## 2021-05-06 NOTE — ED Triage Notes (Signed)
Pt has a knot to left wrist.  Denies trauma to wrist.  States she woke up from a nap w/ pain and swelling.

## 2021-05-07 ENCOUNTER — Telehealth: Payer: Self-pay

## 2021-05-07 DIAGNOSIS — Z9189 Other specified personal risk factors, not elsewhere classified: Secondary | ICD-10-CM

## 2021-05-07 NOTE — Telephone Encounter (Signed)
Transition Care Management Follow-up Telephone Call Date of discharge and from where: 05/06/2021-[place]  How have you been since you were released from the hospital? Patient stated her wrist looks worse. Patient will be returning to the ED or urgent care.  Any questions or concerns? No  Items Reviewed: Did the pt receive and understand the discharge instructions provided? Yes  Medications obtained and verified?  No new meds sent to pharmacy at discharge.  Other? No  Any new allergies since your discharge? No  Dietary orders reviewed? No Do you have support at home? Yes   Home Care and Equipment/Supplies: Were home health services ordered? not applicable If so, what is the name of the agency? N/A  Has the agency set up a time to come to the patient's home? not applicable Were any new equipment or medical supplies ordered?  No What is the name of the medical supply agency? N/A Were you able to get the supplies/equipment? not applicable Do you have any questions related to the use of the equipment or supplies? No  Functional Questionnaire: (I = Independent and D = Dependent) ADLs: I  Bathing/Dressing- I  Meal Prep- I  Eating- I  Maintaining continence- I  Transferring/Ambulation- I  Managing Meds- I  Follow up appointments reviewed:  PCP Hospital f/u appt confirmed? No   Specialist Hospital f/u appt confirmed? No   Are transportation arrangements needed? No  If their condition worsens, is the pt aware to call PCP or go to the Emergency Dept.? Yes Was the patient provided with contact information for the PCP's office or ED? Yes Was to pt encouraged to call back with questions or concerns? Yes

## 2021-05-18 ENCOUNTER — Other Ambulatory Visit: Payer: Self-pay

## 2021-05-18 NOTE — Patient Outreach (Signed)
Medicaid Managed Care Social Work Note  05/18/2021 Name:  Kayla Dunn MRN:  [id number] DOB:  1981/06/15  Kayla Dunn is an 40 y.o. year old female who is a primary patient of Pcp, No.  The Medicaid Managed Care Coordination team was consulted for assistance with:   PCP  Ms. Shackleford was given information about Medicaid Managed Care Coordination team services today. Kayla Dunn Patient agreed to services and verbal consent obtained.  Engaged with patient  for by telephone forinitial visit in response to referral for case management and/or care coordination services.   Assessments/Interventions:  Review of past medical history, allergies, medications, health status, including review of consultants reports, laboratory and other test data, was performed as part of comprehensive evaluation and provision of chronic care management services.  SDOH: (Social Determinant of Health) assessments and interventions performed: BSW contacted patient regarding obtaining a PCP. BSW provided patient with the phone number for the Patient Care Center. Patient stated no other resources are needed at this time.   Advanced Directives Status:  Not addressed in this encounter.  Care Plan                 Allergies  Allergen Reactions   Aspirin Anaphylaxis   Hydromorphone Hcl Anaphylaxis   Aloe Vera Hives and Itching   Dust Mite Extract     Seasonal allergies    Morphine Hives   Shrimp [Shellfish Allergy] Swelling    Medications Reviewed Today     Reviewed by Mike Gip, FNP (Nurse Practitioner) on 10/03/18 at 1028  Med List Status: <None>   Medication Order Taking? Sig Documenting Provider Last Dose Status Informant  amLODipine (NORVASC) 5 MG tablet [id number]  Take 1 tablet (5 mg total) by mouth daily for 30 days. Mike Gip, FNP  Active   busPIRone (BUSPAR) 7.5 MG tablet [id number]  Take 1 tablet (7.5 mg total) by mouth 2 (two) times daily. Mike Gip, FNP  Active   diphenhydrAMINE  (BENADRYL) 25 MG tablet [id number] No Take 25 mg by mouth every 6 (six) hours as needed for allergies. [provider] Taking Active Self  ferrous sulfate 325 (65 FE) MG tablet [id number]  Take 1 tablet (325 mg total) by mouth 2 (two) times daily with a meal. Mike Gip, FNP  Active   ibuprofen (ADVIL,MOTRIN) 600 MG tablet [id number]  Take 1 tablet (600 mg total) by mouth every 6 (six) hours as needed for moderate pain. Mike Gip, FNP  Active     Discontinued 10/03/18 1027 (Patient Preference)            Med Note>> Mike Gip, FNP   10/03/2018 10:27 AM stopped taking on her own.     traZODone (DESYREL) 50 MG tablet [id number]  Take 0.5-1 tablets (25-50 mg total) by mouth at bedtime as needed for sleep. Mike Gip, FNP  Active             Patient Active Problem List   Diagnosis Date Noted   Microcytic anemia 03/09/2018   Thrush, oral 03/09/2018   Gastritis 03/03/2014   Bilateral wrist pain 03/03/2014   Borderline hypertension 03/03/2014   Cholecystitis 08/27/2013   TWIN GESTATION 04/28/2010   GERD 03/07/2008   ANEMIA, IRON DEFICIENCY, UNSPEC. 08/17/2006   DEPRESSION, MAJOR, RECURRENT 08/17/2006   TOBACCO DEPENDENCE 08/17/2006   MIGRAINE, UNSPEC., W/O INTRACTABLE MIGRAINE 08/17/2006   INSOMNIA NOS 08/17/2006    Conditions to be addressed/monitored per PCP order:   PCP  There are no care plans  that you recently modified to display for this patient.   Follow up:  Patient agrees to Care Plan and Follow-up.  Plan: The Managed Medicaid care management team will reach out to the patient again over the next 30 days.  Date/time of next scheduled Social Work care management/care coordination outreach:  06/17/21  Gus Puma, Kenard Gower, [facility]  [facility]  High Risk Managed Medicaid Team  [phone]

## 2021-05-18 NOTE — Patient Instructions (Signed)
Visit Information  Ms. Kayla Dunn was given information about Medicaid Managed Care team care coordination services as a part of their [facility] Community Plan Medicaid benefit. Elias Else verbally consented to engagement with the [facility] Managed Care team.   If you are experiencing a medical emergency, please call 911 or report to your local emergency department or urgent care.   If you have a non-emergency medical problem during routine business hours, please contact your provider's office and ask to speak with a nurse.   For questions related to your [facility], please call: [phone] or visit the homepage here: [email or web address]  If you would like to schedule transportation through your [facility], please call the following number at least 2 days in advance of your appointment: [phone].   Call the Behavioral Health Crisis Line at [phone], at any time, 24 hours a day, 7 days a week. If you are in danger or need immediate medical attention call 911.  If you would like help to quit smoking, call 1-800-QUIT-NOW (734-003-5784) OR Espaol: 1-855-Djelo-Ya (4-627-035-0093) o para ms informacin haga clic aqu or Text READY to 818-299 to register via text  Ms. Kayla Dunn - following are the goals we discussed in your visit today:   Goals Addressed   None     Social Worker will follow up with patient in 30 days.   Gus Puma, BSW, [place] [facility]  [place]  High Risk Managed Medicaid Team  [phone]   Following is a copy of your plan of care:  There are no care plans that you recently modified to display for this patient.

## 2021-06-17 ENCOUNTER — Other Ambulatory Visit: Payer: Self-pay

## 2021-06-17 NOTE — Patient Instructions (Signed)
Visit Information  Kayla Dunn was given information about Medicaid Managed Care team care coordination services as a part of their [facility] Community Plan Medicaid benefit. Kayla Dunn verbally consented to engagement with the [facility] Managed Care team.   If you are experiencing a medical emergency, please call 911 or report to your local emergency department or urgent care.   If you have a non-emergency medical problem during routine business hours, please contact your provider's office and ask to speak with a nurse.   For questions related to your [facility], please call: [phone] or visit the homepage here: [email or web address]  If you would like to schedule transportation through your [facility], please call the following number at least 2 days in advance of your appointment: [phone].   Call the Behavioral Health Crisis Line at [phone], at any time, 24 hours a day, 7 days a week. If you are in danger or need immediate medical attention call 911.  If you would like help to quit smoking, call 1-800-QUIT-NOW ((831) 693-4843) OR Espaol: 1-855-Djelo-Ya (6-659-935-7017) o para ms informacin haga clic aqu or Text READY to 793-903 to register via text  Kayla Dunn - following are the goals we discussed in your visit today:   Goals Addressed   None      The  Patient                                              has been provided with contact information for the Managed Medicaid care management team and has been advised to call with any health related questions or concerns.   Gus Puma, BSW, [facility]   [place]  High Risk Managed Medicaid Team  [phone]   Following is a copy of your plan of care:  There are no care plans that you recently modified to display for this patient.

## 2021-06-17 NOTE — Patient Outreach (Signed)
Medicaid Managed Care Social Work Note  06/17/2021 Name:  Kayla Dunn MRN:  [id number] DOB:  [birth year]  Kayla Dunn is an 40 y.o. year old female who is a primary patient of Pcp, No.  The Medicaid Managed Care Coordination team was consulted for assistance with:   PCP  Kayla Dunn was given information about Medicaid Managed Care Coordination team services today. Kayla Dunn Patient agreed to services and verbal consent obtained.  Engaged with patient  for by telephone forfollow up visit in response to referral for case management and/or care coordination services.   Assessments/Interventions:  Review of past medical history, allergies, medications, health status, including review of consultants reports, laboratory and other test data, was performed as part of comprehensive evaluation and provision of chronic care management services.  SDOH: (Social Determinant of Health) assessments and interventions performed: BSW completed follow up call with patient. Patient stated she has not contacted the [facility] to set a new patient appointment yet due to an emergency with her children. Patient states she will give them a call to get an appointment scheduled. No other resources are needed at this time.    Advanced Directives Status:  Not addressed in this encounter.  Care Plan                 Allergies  Allergen Reactions   Aspirin Anaphylaxis   Hydromorphone Hcl Anaphylaxis   Aloe Vera Hives and Itching   Dust Mite Extract     Seasonal allergies    Morphine Hives   Shrimp [Shellfish Allergy] Swelling    Medications Reviewed Today     Reviewed by Mike Gip, FNP (Nurse Practitioner) on 10/03/18 at 1028  Med List Status: <None>   Medication Order Taking? Sig Documenting Provider Last Dose Status Informant  amLODipine (NORVASC) 5 MG tablet [id number]  Take 1 tablet (5 mg total) by mouth daily for 30 days. Mike Gip, FNP  Active   busPIRone (BUSPAR) 7.5 MG  tablet [id number]  Take 1 tablet (7.5 mg total) by mouth 2 (two) times daily. Mike Gip, FNP  Active   diphenhydrAMINE (BENADRYL) 25 MG tablet [id number] No Take 25 mg by mouth every 6 (six) hours as needed for allergies. [provider] Taking Active Self  ferrous sulfate 325 (65 FE) MG tablet [id number]  Take 1 tablet (325 mg total) by mouth 2 (two) times daily with a meal. Mike Gip, FNP  Active   ibuprofen (ADVIL,MOTRIN) 600 MG tablet [id number]  Take 1 tablet (600 mg total) by mouth every 6 (six) hours as needed for moderate pain. Mike Gip, FNP  Active     Discontinued 10/03/18 1027 (Patient Preference)            Med Note>> Mike Gip, FNP   10/03/2018 10:27 AM stopped taking on her own.     traZODone (DESYREL) 50 MG tablet [id number]  Take 0.5-1 tablets (25-50 mg total) by mouth at bedtime as needed for sleep. Mike Gip, FNP  Active             Patient Active Problem List   Diagnosis Date Noted   Microcytic anemia 03/09/2018   Thrush, oral 03/09/2018   Gastritis 03/03/2014   Bilateral wrist pain 03/03/2014   Borderline hypertension 03/03/2014   Cholecystitis 08/27/2013   TWIN GESTATION 04/28/2010   GERD 03/07/2008   ANEMIA, IRON DEFICIENCY, UNSPEC. 08/17/2006   DEPRESSION, MAJOR, RECURRENT 08/17/2006   TOBACCO DEPENDENCE 08/17/2006   MIGRAINE, UNSPEC., W/O INTRACTABLE MIGRAINE  08/17/2006   INSOMNIA NOS 08/17/2006    Conditions to be addressed/monitored per PCP order:   PCP  There are no care plans that you recently modified to display for this patient.   Follow up:  Patient agrees to Care Plan and Follow-up.  Plan: The  Patient has been provided with contact information for the Managed Medicaid care management team and has been advised to call with any health related questions or concerns.    Gus Puma, BSW, [facility]   [employer] Risk Managed Medicaid Team  [phone]

## 2021-06-20 DIAGNOSIS — D649 Anemia, unspecified: Secondary | ICD-10-CM

## 2021-06-20 HISTORY — DX: Anemia, unspecified: D64.9

## 2021-07-09 ENCOUNTER — Ambulatory Visit (INDEPENDENT_AMBULATORY_CARE_PROVIDER_SITE_OTHER): Payer: Medicaid Other | Admitting: Nurse Practitioner

## 2021-07-09 ENCOUNTER — Other Ambulatory Visit: Payer: Self-pay

## 2021-07-09 ENCOUNTER — Encounter: Payer: Self-pay | Admitting: Nurse Practitioner

## 2021-07-09 VITALS — BP 142/84 | HR 65 | Resp 16 | Wt 139.2 lb

## 2021-07-09 DIAGNOSIS — R6882 Decreased libido: Secondary | ICD-10-CM

## 2021-07-09 DIAGNOSIS — I10 Essential (primary) hypertension: Secondary | ICD-10-CM | POA: Diagnosis not present

## 2021-07-09 DIAGNOSIS — Z0000 Encounter for general adult medical examination without abnormal findings: Secondary | ICD-10-CM

## 2021-07-09 DIAGNOSIS — R232 Flushing: Secondary | ICD-10-CM | POA: Diagnosis not present

## 2021-07-09 DIAGNOSIS — N898 Other specified noninflammatory disorders of vagina: Secondary | ICD-10-CM

## 2021-07-09 DIAGNOSIS — M2559 Pain in other specified joint: Secondary | ICD-10-CM | POA: Diagnosis not present

## 2021-07-09 MED ORDER — METHOCARBAMOL 750 MG PO TABS: 750.0000 mg | ORAL_TABLET | Freq: Four times a day (QID) | ORAL | 0 refills | Status: DC | PRN

## 2021-07-09 MED ORDER — LOSARTAN POTASSIUM 25 MG PO TABS: 25.0000 mg | ORAL_TABLET | Freq: Every day | ORAL | 0 refills | Status: DC

## 2021-07-09 MED ORDER — PREDNISONE 20 MG PO TABS: 20.0000 mg | ORAL_TABLET | Freq: Every day | ORAL | 0 refills | Status: AC

## 2021-07-09 NOTE — Progress Notes (Signed)
Kayla Dunn, [place]  [address] Phone:  [phone]   Fax:  [phone] Subjective:   Patient ID: Kayla Dunn, female    DOB: Dec 13, 1980, 41 y.o.   MRN: [id number]  Chief Complaint  Patient presents with   Joint Pain   [name] 41 y.o. female  has a past medical history of Anemia, Arthritis, Bronchitis, Depression, GERD (gastroesophageal reflux disease), Heart murmur, Hypertension, Insomnia, Insomnia, Migraines, Pancreatitis, Perimenopausal, PID (acute pelvic inflammatory disease), Pregnancy induced hypertension, Preterm labor, PTSD (post-traumatic stress disorder), and Seasonal allergies.  To the [facility] multiple joint pain and swelling.   States that pain in multiple joints has worsened over the past 2.5 yrs. Has been using non pharmacological means to assist with pain with no improvement. Surgery was recommended in the past, would prefer to attempt other treatment modalities prior to surgery. Denies any improving factors, pain worsens with over exertion. States that she has some difficulty with mobility at times, requiring assistance. Has had to eliminate many hobbies and activities due to pain. Currently applying for disability, has been unable to work since 2020 due to pain.   States that she has been experiencing peri menopause for 8 yrs. Endorses hot flashes and decreased libido. States that she has regular menstrual cycles, ranging between being light and heavy in flow. Denies any other complaints today. Has had vaginal dryness and has been unable to have intercourse for several months.  Denies any fatigue, chest pain, shortness of breath, HA or dizziness. Denies any blurred vision, numbness or tingling.  Past Medical History:  Diagnosis Date   Anemia    Arthritis    Bronchitis    Depression    no meds, fine right now   GERD (gastroesophageal reflux disease)    Heart murmur    Hypertension    "havent been taking my  medicine in awhile"   Insomnia    Insomnia    Migraines    Pancreatitis    Perimenopausal    PID (acute pelvic inflammatory disease)    Pregnancy induced hypertension    Preterm labor    PTSD (post-traumatic stress disorder)    Seasonal allergies     Past Surgical History:  Procedure Laterality Date   CESAREAN SECTION     CHOLECYSTECTOMY N/A 08/27/2013   Procedure: LAPAROSCOPIC CHOLECYSTECTOMY WITH INTRAOPERATIVE CHOLANGIOGRAM;  Surgeon: Pedro Earls, MD;  Location: [facility];  Service: General;  Laterality: N/A;   DILATION AND CURETTAGE OF UTERUS     OVARY SURGERY     TUBAL LIGATION      Family History  Problem Relation Age of Onset   Hypertension Father    Liver disease Father    Stroke Father    Cancer Mother        colon   Arthritis Mother    Diabetes Paternal Grandmother    Hypertension Paternal Grandmother    Heart disease Paternal Grandmother     Social History   Socioeconomic History   Marital status: Single    Spouse name: Not on file   Number of children: Not on file   Years of education: Not on file   Highest education level: Not on file  Occupational History   Not on file  Tobacco Use   Smoking status: Light Smoker    Packs/day: 0.25    Years: 12.00    Pack years: 3.00    Types: Cigarettes   Smokeless tobacco: Never  Vaping Use   Vaping Use: Never used  Substance and Sexual Activity   Alcohol use: No   Drug use: Yes    Types: Marijuana    Comment: 4-5 times a week   Sexual activity: Yes    Birth control/protection: Surgical  Other Topics Concern   Not on file  Social History Narrative   Not on file   Social Determinants of Health   Financial Resource Strain: Not on file  Food Insecurity: Not on file  Transportation Needs: Not on file  Physical Activity: Not on file  Stress: Not on file  Social Connections: Not on file  Intimate Partner Violence: Not on file    Outpatient Medications Prior to Visit  Medication Sig Dispense  Refill   albuterol (PROVENTIL) (2.5 MG/3ML) 0.083% nebulizer solution Take 3 mLs (2.5 mg total) by nebulization every 6 (six) hours as needed for wheezing or shortness of breath. 150 mL 0   amLODipine (NORVASC) 5 MG tablet TAKE 1 TABLET BY MOUTH EVERY DAY 90 tablet 1   busPIRone (BUSPAR) 7.5 MG tablet Take 1 tablet (7.5 mg total) by mouth 2 (two) times daily. 60 tablet 2   diphenhydrAMINE (BENADRYL) 25 MG tablet Take 25 mg by mouth every 6 (six) hours as needed for allergies.     ferrous sulfate 325 (65 FE) MG tablet TAKE 1 TABLET BY MOUTH 2 TIMES DAILY WITH A MEAL. 30 tablet 0   ibuprofen (ADVIL,MOTRIN) 600 MG tablet Take 1 tablet (600 mg total) by mouth every 6 (six) hours as needed for moderate pain. 30 tablet 0   topiramate (TOPAMAX) 25 MG tablet Take 1 tablet (25 mg total) by mouth 2 (two) times daily. Start with 25 mg po QHS x 1 week. Then increase to BID after. 60 tablet 2   predniSONE (STERAPRED UNI-PAK 21 TAB) 10 MG (21) TBPK tablet Take as directed on pack 21 tablet 0   traZODone (DESYREL) 50 MG tablet Take 0.5-1 tablets (25-50 mg total) by mouth at bedtime as needed for sleep. 90 tablet 2   No facility-administered medications prior to visit.    Allergies  Allergen Reactions   Aspirin Anaphylaxis   Hydromorphone Hcl Anaphylaxis   Aloe Vera Hives and Itching   Dust Mite Extract     Seasonal allergies    Morphine Hives   Shrimp [Shellfish Allergy] Swelling    Review of Systems  Constitutional:  Negative for chills, fever and malaise/fatigue.  Respiratory:  Negative for cough and shortness of breath.   Cardiovascular:  Negative for chest pain, palpitations and leg swelling.  Gastrointestinal:  Negative for abdominal pain, blood in stool, constipation, diarrhea, nausea and vomiting.  Genitourinary:        See HPI  Musculoskeletal:  Positive for back pain and joint pain. Negative for falls, myalgias and neck pain.  Skin: Negative.   Neurological: Negative.    Psychiatric/Behavioral:  Negative for depression. The patient is not nervous/anxious.   All other systems reviewed and are negative.     Objective:    Physical Exam Vitals reviewed.  Constitutional:      General: She is not in acute distress.    Appearance: Normal appearance. She is normal weight.  HENT:     Head: Normocephalic.  Cardiovascular:     Rate and Rhythm: Normal rate and regular rhythm.     Pulses: Normal pulses.     Heart sounds: Normal heart sounds.     Comments: No obvious peripheral edema Pulmonary:  Effort: Pulmonary effort is normal.     Breath sounds: Normal breath sounds.  Musculoskeletal:        General: No swelling, tenderness, deformity or signs of injury. Normal range of motion.     Right lower leg: No edema.     Left lower leg: No edema.  Skin:    General: Skin is warm and dry.     Capillary Refill: Capillary refill takes less than 2 seconds.  Neurological:     General: No focal deficit present.     Mental Status: She is alert and oriented to person, place, and time.  Psychiatric:        Mood and Affect: Mood normal.        Behavior: Behavior normal.        Thought Content: Thought content normal.        Judgment: Judgment normal.    BP (!) 142/84    Pulse 65    Resp 16    Wt 139 lb 3.2 oz (63.1 kg)    SpO2 99%    BMI 27.19 kg/m  Wt Readings from Last 3 Encounters:  07/09/21 139 lb 3.2 oz (63.1 kg)  05/06/21 130 lb (59 kg)  08/20/18 134 lb 12.8 oz (61.1 kg)    Immunization History  Administered Date(s) Administered   Influenza Whole 04/28/2010   Influenza,inj,Quad PF,6+ Mos 03/03/2014, 05/14/2018   Td 07/21/1997    Diabetic Foot Exam - Simple   No data filed     Lab Results  Component Value Date   TSH 1.380 03/08/2018   Lab Results  Component Value Date   WBC 7.6 03/10/2018   HGB 8.2 (L) 03/10/2018   HCT 28.5 (L) 03/10/2018   MCV 68.8 (L) 03/10/2018   PLT 312 03/10/2018   Lab Results  Component Value Date   NA 141  03/10/2018   K 4.3 03/10/2018   CO2 26 03/10/2018   GLUCOSE 93 03/10/2018   BUN 12 03/10/2018   CREATININE 0.75 03/10/2018   BILITOT 0.5 03/10/2018   ALKPHOS 76 03/10/2018   AST 151 (H) 03/10/2018   ALT 108 (H) 03/10/2018   PROT 7.3 03/10/2018   ALBUMIN 4.0 03/10/2018   CALCIUM 9.1 03/10/2018   ANIONGAP 7 03/10/2018   No results found for: CHOL No results found for: HDL No results found for: LDLCALC No results found for: TRIG No results found for: CHOLHDL Lab Results  Component Value Date   HGBA1C 5.0 03/08/2018       Assessment & Plan:   Problem List Items Addressed This Visit   None Visit Diagnoses     Pain in other joint    -  Primary   Relevant Medications   methocarbamol (ROBAXIN-750) 750 MG tablet   predniSONE (DELTASONE) 20 MG tablet Continue non pharmacological methods for management of symptoms   Other Relevant Orders   Sedimentation Rate   C-reactive protein   Hot flashes       Relevant Medications   Discussed non pharmacological methods for management of symptoms   Other Relevant Orders   Estrogens, Total   Decreased libido       Relevant Orders   Estrogens, Total Discussed non pharmacological methods for management of symptoms   Vaginal dryness       Relevant Orders   Estrogens, Total Discussed non pharmacological methods for management of symptoms   Healthcare maintenance       Relevant Orders   CBC with Differential/Platelet   Comprehensive metabolic  panel   Lipid panel Encouraged continued diet and exercise efforts  Encouraged continued compliance with medication     Primary hypertension       Relevant Medications   losartan (COZAAR) 25 MG tablet Encouraged continued diet and exercise efforts  Encouraged continued compliance with medication     Follow up in 1 mth for reevaluation of hypertension and wellness exam, sooner as needed     I have discontinued Kayla Dunn "Nikki Aprea"'s predniSONE. I am also having her start on  methocarbamol, losartan, and predniSONE. Additionally, I am having her maintain her diphenhydrAMINE, ibuprofen, traZODone, busPIRone, topiramate, amLODipine, ferrous sulfate, and albuterol.  Meds ordered this encounter  Medications   methocarbamol (ROBAXIN-750) 750 MG tablet    Sig: Take 1 tablet (750 mg total) by mouth every 6 (six) hours as needed for muscle spasms.    Dispense:  30 tablet    Refill:  0   losartan (COZAAR) 25 MG tablet    Sig: Take 1 tablet (25 mg total) by mouth daily.    Dispense:  30 tablet    Refill:  0   predniSONE (DELTASONE) 20 MG tablet    Sig: Take 1 tablet (20 mg total) by mouth daily with breakfast for 5 days.    Dispense:  5 tablet    Refill:  0     Teena Dunk, NP

## 2021-07-09 NOTE — Patient Instructions (Signed)
You were seen today in the [facility] for joint pain. Labs were collected, results will be available via MyChart or, if abnormal, you will be contacted by clinic staff. You were prescribed medications, please take as directed. Please follow up in 1 mths for  wellness exam.

## 2021-07-14 ENCOUNTER — Telehealth: Payer: Self-pay

## 2021-07-14 ENCOUNTER — Other Ambulatory Visit: Payer: Self-pay | Admitting: Nurse Practitioner

## 2021-07-14 DIAGNOSIS — D508 Other iron deficiency anemias: Secondary | ICD-10-CM

## 2021-07-14 LAB — CBC WITH DIFFERENTIAL/PLATELET
Basophils Absolute: 0.1 10*3/uL (ref 0.0–0.2)
Basos: 1 %
EOS (ABSOLUTE): 0.1 10*3/uL (ref 0.0–0.4)
Eos: 1 %
Hematocrit: 26.4 % — ABNORMAL LOW (ref 34.0–46.6)
Hemoglobin: 7.2 g/dL — ABNORMAL LOW (ref 11.1–15.9)
Immature Grans (Abs): 0 10*3/uL (ref 0.0–0.1)
Immature Granulocytes: 0 %
Lymphocytes Absolute: 2.2 10*3/uL (ref 0.7–3.1)
Lymphs: 26 %
MCH: 18.5 pg — ABNORMAL LOW (ref 26.6–33.0)
MCHC: 27.3 g/dL — ABNORMAL LOW (ref 31.5–35.7)
MCV: 68 fL — ABNORMAL LOW (ref 79–97)
Monocytes Absolute: 0.8 10*3/uL (ref 0.1–0.9)
Monocytes: 9 %
Neutrophils Absolute: 5.3 10*3/uL (ref 1.4–7.0)
Neutrophils: 63 %
Platelets: 508 10*3/uL — ABNORMAL HIGH (ref 150–450)
RBC: 3.89 x10E6/uL (ref 3.77–5.28)
RDW: 16.8 % — ABNORMAL HIGH (ref 11.7–15.4)
WBC: 8.5 10*3/uL (ref 3.4–10.8)

## 2021-07-14 LAB — COMPREHENSIVE METABOLIC PANEL
ALT: 13 IU/L (ref 0–32)
AST: 22 IU/L (ref 0–40)
Albumin/Globulin Ratio: 1.5 (ref 1.2–2.2)
Albumin: 4.6 g/dL (ref 3.8–4.8)
Alkaline Phosphatase: 84 IU/L (ref 44–121)
BUN/Creatinine Ratio: 21 (ref 9–23)
BUN: 13 mg/dL (ref 6–20)
Bilirubin Total: <0.2 mg/dL (ref 0.0–1.2)
CO2: 21 mmol/L (ref 20–29)
Calcium: 9.2 mg/dL (ref 8.7–10.2)
Chloride: 106 mmol/L (ref 96–106)
Creatinine, Ser: 0.63 mg/dL (ref 0.57–1.00)
Globulin, Total: 3 g/dL (ref 1.5–4.5)
Glucose: 87 mg/dL (ref 70–99)
Potassium: 4.5 mmol/L (ref 3.5–5.2)
Sodium: 142 mmol/L (ref 134–144)
Total Protein: 7.6 g/dL (ref 6.0–8.5)
eGFR: 116 mL/min/{1.73_m2} (ref >59)

## 2021-07-14 LAB — LIPID PANEL
Chol/HDL Ratio: 1.9 ratio (ref 0.0–4.4)
Cholesterol, Total: 162 mg/dL (ref 100–199)
HDL: 87 mg/dL (ref >39)
LDL Chol Calc (NIH): 62 mg/dL (ref 0–99)
Triglycerides: 70 mg/dL (ref 0–149)
VLDL Cholesterol Cal: 13 mg/dL (ref 5–40)

## 2021-07-14 LAB — C-REACTIVE PROTEIN: CRP: <1 mg/L (ref 0–10)

## 2021-07-14 LAB — SEDIMENTATION RATE: Sed Rate: 23 mm/hr (ref 0–32)

## 2021-07-14 LAB — ESTROGENS, TOTAL: Estrogen: 197 pg/mL

## 2021-07-14 MED ORDER — FERROUS SULFATE 325 (65 FE) MG PO TABS: 325.0000 mg | ORAL_TABLET | Freq: Every day | ORAL | 3 refills | Status: DC

## 2021-07-14 NOTE — Telephone Encounter (Signed)
Pt was called and is aware of results, DOB was confirmed. Patient stated that the iron pills she received she has taken them before and cause her to have hair loss, will consult with Doctor for another RX.

## 2021-07-14 NOTE — Telephone Encounter (Signed)
-----   Message from Kathrynn Speed, NP sent at 07/13/2021  2:46 PM EST ----- Labs consistent with previous studies, with chronic anemia. No other lab abnormalities noted. Estrogen pending.

## 2021-07-31 ENCOUNTER — Other Ambulatory Visit: Payer: Self-pay | Admitting: Nurse Practitioner

## 2021-07-31 DIAGNOSIS — I10 Essential (primary) hypertension: Secondary | ICD-10-CM

## 2021-08-06 ENCOUNTER — Ambulatory Visit (INDEPENDENT_AMBULATORY_CARE_PROVIDER_SITE_OTHER): Payer: Medicaid Other | Admitting: Nurse Practitioner

## 2021-08-06 ENCOUNTER — Other Ambulatory Visit: Payer: Self-pay

## 2021-08-06 ENCOUNTER — Encounter: Payer: Self-pay | Admitting: Nurse Practitioner

## 2021-08-06 VITALS — BP 145/86 | HR 70 | Temp 98.7°F | Ht 61.5 in | Wt 140.0 lb

## 2021-08-06 DIAGNOSIS — N898 Other specified noninflammatory disorders of vagina: Secondary | ICD-10-CM

## 2021-08-06 DIAGNOSIS — K219 Gastro-esophageal reflux disease without esophagitis: Secondary | ICD-10-CM

## 2021-08-06 DIAGNOSIS — I10 Essential (primary) hypertension: Secondary | ICD-10-CM | POA: Diagnosis not present

## 2021-08-06 DIAGNOSIS — D508 Other iron deficiency anemias: Secondary | ICD-10-CM

## 2021-08-06 MED ORDER — METOPROLOL SUCCINATE ER 25 MG PO TB24: 25.0000 mg | ORAL_TABLET | Freq: Every day | ORAL | 3 refills | Status: DC

## 2021-08-06 MED ORDER — ESTRADIOL 0.025 MG/24HR TD PTWK: 0.0250 mg | MEDICATED_PATCH | TRANSDERMAL | 12 refills | Status: DC

## 2021-08-06 MED ORDER — AMLODIPINE BESYLATE 10 MG PO TABS: 10.0000 mg | ORAL_TABLET | Freq: Every day | ORAL | 0 refills | Status: DC

## 2021-08-06 MED ORDER — PANTOPRAZOLE SODIUM 40 MG PO TBEC
40.0000 mg | DELAYED_RELEASE_TABLET | Freq: Every day | ORAL | 3 refills | Status: DC
Start: 2021-08-06 — End: 2021-11-01

## 2021-08-06 NOTE — Patient Instructions (Signed)
You were seen today in the [facility] for wellness visit and vaginal dryness. Labs were collected, results will be available via MyChart or, if abnormal, you will be contacted by clinic staff. You were prescribed medications, please take as directed. Please follow up in 1 mth for reevaluation hypertension and pap smear.

## 2021-08-06 NOTE — Progress Notes (Signed)
Kayla Dunn, [place]  [address] Phone:  [phone]   Fax:  [phone] Subjective:   Patient ID: Kayla Dunn, female    DOB: June 07, 1981, 41 y.o.   MRN: [id number]  Chief Complaint  Patient presents with   Follow-up    Pt is here for wellness check. Pt stated when she takes losartan her heart fluttering also she has hot flashes and not able to sleep   [name] 41 y.o. female  has a past medical history of Anemia, Arthritis, Bronchitis, Depression, GERD (gastroesophageal reflux disease), Heart murmur, Hypertension, Insomnia, Insomnia, Migraines, Pancreatitis, Perimenopausal, PID (acute pelvic inflammatory disease), Pregnancy induced hypertension, Preterm labor, PTSD (post-traumatic stress disorder), and Seasonal allergies.  To the [facility] for wellness visit.  Patient also concerned about hot flashes which occur frequently, but the most pronounced during the night. She has been unable to sleep due to hot flashes. Also continues to suffer with vaginal dryness and decreased libido. Currently compliant with all medications, including iron supplements. States that she has been taking Losartan, but feel like they may be causing worsening of existing heart murmur/ palpitations, requesting change in medication.Questioning whether she can begin taking estrogen replacement given recent lab results. Patient also requesting prescription for GERD symptoms. States that she takes OTC Prilosec with no improvement in symptoms, requesting prescription.   Denies any other complaints today.  Denies any fever. Denies any fatigue, chest pain, shortness of breath, HA or dizziness. Denies any blurred vision, numbness or tingling.  Past Medical History:  Diagnosis Date   Anemia    Arthritis    Bronchitis    Depression    no meds, fine right now   GERD (gastroesophageal reflux disease)    Heart murmur    Hypertension    "havent been taking my medicine in  awhile"   Insomnia    Insomnia    Migraines    Pancreatitis    Perimenopausal    PID (acute pelvic inflammatory disease)    Pregnancy induced hypertension    Preterm labor    PTSD (post-traumatic stress disorder)    Seasonal allergies     Past Surgical History:  Procedure Laterality Date   CESAREAN SECTION     CHOLECYSTECTOMY N/A 08/27/2013   Procedure: LAPAROSCOPIC CHOLECYSTECTOMY WITH INTRAOPERATIVE CHOLANGIOGRAM;  Surgeon: Pedro Earls, MD;  Location: [facility];  Service: General;  Laterality: N/A;   DILATION AND CURETTAGE OF UTERUS     OVARY SURGERY     TUBAL LIGATION      Family History  Problem Relation Age of Onset   Hypertension Father    Liver disease Father    Stroke Father    Cancer Mother        colon   Arthritis Mother    Diabetes Paternal Grandmother    Hypertension Paternal Grandmother    Heart disease Paternal Grandmother     Social History   Socioeconomic History   Marital status: Single    Spouse name: Not on file   Number of children: Not on file   Years of education: Not on file   Highest education level: Not on file  Occupational History   Not on file  Tobacco Use   Smoking status: Light Smoker    Packs/day: 0.25    Years: 12.00    Pack years: 3.00    Types: Cigarettes   Smokeless tobacco: Never  Vaping Use   Vaping Use: Never used  Substance and Sexual Activity   Alcohol use: No   Drug use: Yes    Types: Marijuana    Comment: 4-5 times a week   Sexual activity: Yes    Birth control/protection: Surgical  Other Topics Concern   Not on file  Social History Narrative   Pt is smoking delta 8   Social Determinants of Health   Financial Resource Strain: Not on file  Food Insecurity: Not on file  Transportation Needs: Not on file  Physical Activity: Not on file  Stress: Not on file  Social Connections: Not on file  Intimate Partner Violence: Not on file    Outpatient Medications Prior to Visit  Medication Sig Dispense Refill    diphenhydrAMINE (BENADRYL) 25 MG tablet Take 25 mg by mouth every 6 (six) hours as needed for allergies.     ferrous sulfate 325 (65 FE) MG tablet Take 1 tablet (325 mg total) by mouth daily. 30 tablet 3   methocarbamol (ROBAXIN-750) 750 MG tablet Take 1 tablet (750 mg total) by mouth every 6 (six) hours as needed for muscle spasms. 30 tablet 0   losartan (COZAAR) 25 MG tablet TAKE 1 TABLET (25 MG TOTAL) BY MOUTH DAILY. 30 tablet 0   albuterol (PROVENTIL) (2.5 MG/3ML) 0.083% nebulizer solution Take 3 mLs (2.5 mg total) by nebulization every 6 (six) hours as needed for wheezing or shortness of breath. (Patient not taking: Reported on 08/06/2021) 150 mL 0   amoxicillin (AMOXIL) 500 MG capsule Take 500 mg by mouth every 8 (eight) hours.     busPIRone (BUSPAR) 7.5 MG tablet Take 1 tablet (7.5 mg total) by mouth 2 (two) times daily. (Patient not taking: Reported on 08/06/2021) 60 tablet 2   ibuprofen (ADVIL,MOTRIN) 600 MG tablet Take 1 tablet (600 mg total) by mouth every 6 (six) hours as needed for moderate pain. (Patient not taking: Reported on 08/06/2021) 30 tablet 0   nystatin ointment (MYCOSTATIN) SMARTSIG:Liberally Topical 4 Times Daily     topiramate (TOPAMAX) 25 MG tablet Take 1 tablet (25 mg total) by mouth 2 (two) times daily. Start with 25 mg po QHS x 1 week. Then increase to BID after. (Patient not taking: Reported on 08/06/2021) 60 tablet 2   amLODipine (NORVASC) 5 MG tablet TAKE 1 TABLET BY MOUTH EVERY DAY (Patient not taking: Reported on 08/06/2021) 90 tablet 1   No facility-administered medications prior to visit.    Allergies  Allergen Reactions   Aspirin Anaphylaxis   Hydromorphone Hcl Anaphylaxis   Aloe Vera Hives and Itching   Dust Mite Extract     Seasonal allergies    Morphine Hives   Shrimp [Shellfish Allergy] Swelling    Review of Systems  Constitutional:  Negative for chills, fever and malaise/fatigue.       See HPI  HENT: Negative.    Respiratory:  Negative for cough  and shortness of breath.   Cardiovascular:  Negative for chest pain, palpitations and leg swelling.  Gastrointestinal:  Positive for heartburn. Negative for abdominal pain, blood in stool, constipation, diarrhea, nausea and vomiting.  Genitourinary:  Negative for dysuria, flank pain, frequency, hematuria and urgency.       See HPI  Musculoskeletal: Negative.   Skin: Negative.   Neurological: Negative.   Psychiatric/Behavioral:  Negative for depression. The patient has insomnia. The patient is not nervous/anxious.   All other systems reviewed and are negative.     Objective:    Physical Exam Vitals reviewed.  Constitutional:  General: She is not in acute distress.    Appearance: Normal appearance. She is normal weight.  HENT:     Head: Normocephalic.     Right Ear: Tympanic membrane, ear canal and external ear normal. There is no impacted cerumen.     Left Ear: Tympanic membrane, ear canal and external ear normal. There is no impacted cerumen.     Nose: Nose normal. No congestion or rhinorrhea.     Mouth/Throat:     Mouth: Mucous membranes are moist.     Pharynx: Oropharynx is clear. No oropharyngeal exudate or posterior oropharyngeal erythema.  Eyes:     General: No scleral icterus.       Right eye: No discharge.        Left eye: No discharge.     Extraocular Movements: Extraocular movements intact.     Conjunctiva/sclera: Conjunctivae normal.     Pupils: Pupils are equal, round, and reactive to light.  Neck:     Vascular: No carotid bruit.  Cardiovascular:     Rate and Rhythm: Normal rate and regular rhythm.     Pulses: Normal pulses.     Heart sounds: Normal heart sounds.     Comments: No obvious peripheral edema Pulmonary:     Effort: Pulmonary effort is normal.     Breath sounds: Normal breath sounds.  Abdominal:     General: Abdomen is flat. Bowel sounds are normal. There is no distension.     Palpations: Abdomen is soft. There is no mass.     Tenderness: There  is no abdominal tenderness. There is no right CVA tenderness, left CVA tenderness, guarding or rebound.     Hernia: No hernia is present.  Musculoskeletal:        General: No swelling, tenderness, deformity or signs of injury. Normal range of motion.     Cervical back: Normal range of motion. No rigidity or tenderness.     Right lower leg: No edema.     Left lower leg: No edema.  Lymphadenopathy:     Cervical: No cervical adenopathy.  Skin:    General: Skin is warm and dry.     Capillary Refill: Capillary refill takes less than 2 seconds.  Neurological:     General: No focal deficit present.     Mental Status: She is alert and oriented to person, place, and time.     Cranial Nerves: No cranial nerve deficit.     Sensory: No sensory deficit.     Motor: No weakness.     Coordination: Coordination normal.     Gait: Gait normal.     Deep Tendon Reflexes: Reflexes normal.  Psychiatric:        Mood and Affect: Mood normal.        Behavior: Behavior normal.        Thought Content: Thought content normal.        Judgment: Judgment normal.    BP (!) 145/86    Pulse 70    Temp 98.7 F (37.1 C)    Ht 5' 1.5" (1.562 m)    Wt 140 lb (63.5 kg)    LMP 07/28/2021 (Exact Date)    SpO2 100%    BMI 26.02 kg/m  Wt Readings from Last 3 Encounters:  08/06/21 140 lb (63.5 kg)  07/09/21 139 lb 3.2 oz (63.1 kg)  05/06/21 130 lb (59 kg)    Immunization History  Administered Date(s) Administered   Influenza Whole 04/28/2010   Influenza,inj,Quad PF,6+  Mos 03/03/2014, 05/14/2018   Td 07/21/1997    Diabetic Foot Exam - Simple   No data filed     Lab Results  Component Value Date   TSH 1.380 03/08/2018   Lab Results  Component Value Date   WBC 8.5 07/09/2021   HGB 7.2 (L) 07/09/2021   HCT 26.4 (L) 07/09/2021   MCV 68 (L) 07/09/2021   PLT 508 (H) 07/09/2021   Lab Results  Component Value Date   NA 142 07/09/2021   K 4.5 07/09/2021   CO2 21 07/09/2021   GLUCOSE 87 07/09/2021   BUN  13 07/09/2021   CREATININE 0.63 07/09/2021   BILITOT <0.2 07/09/2021   ALKPHOS 84 07/09/2021   AST 22 07/09/2021   ALT 13 07/09/2021   PROT 7.6 07/09/2021   ALBUMIN 4.6 07/09/2021   CALCIUM 9.2 07/09/2021   ANIONGAP 7 03/10/2018   EGFR 116 07/09/2021   Lab Results  Component Value Date   CHOL 162 07/09/2021   Lab Results  Component Value Date   HDL 87 07/09/2021   Lab Results  Component Value Date   LDLCALC 62 07/09/2021   Lab Results  Component Value Date   TRIG 70 07/09/2021   Lab Results  Component Value Date   CHOLHDL 1.9 07/09/2021   Lab Results  Component Value Date   HGBA1C 5.0 03/08/2018       Assessment & Plan:   Problem List Items Addressed This Visit       Digestive   GERD   Relevant Medications   pantoprazole (PROTONIX) 40 MG tablet Discussed diet options at length  Informed to discontinue Prilosec and take newly prescribed Protonix Will continue to monitor for improvement in symptoms      Other   ANEMIA, IRON DEFICIENCY, UNSPEC.   Relevant Orders   Iron, TIBC and Ferritin Panel   CBC with Differential/Platelet Encouraged to continue being compliant with iron supplements    Other Visit Diagnoses     Vaginal dryness    -  Primary   Relevant Medications   estradiol (CLIMARA - DOSED IN MG/24 HR) 0.025 mg/24hr patch Discussed risk factors of estrogen replacement, patient demonstrated understanding and continues to request replacement therapy Patient given anticipatory guidance  Will continue to monitor for SE, in addition to estrogen levels  Discussed non pharmacological methods for management of hot flashes, vaginal dryness and decreased libido    Primary hypertension       Relevant Medications   metoprolol succinate (TOPROL-XL) 25 MG 24 hr tablet, added to therapy   amLODipine (NORVASC) 10 MG tablet, added to therapy  Losartan discontinued  Encouraged continued diet and exercise efforts  Encouraged continued compliance with medication      Follow up in 1 mth for reevaluation of B/P, anemia and post menopausal symptoms and pap smear, sooner as needed     I have discontinued Casondra N. Picariello "Nikki Web"'s amLODipine and losartan. I am also having her start on metoprolol succinate, amLODipine, estradiol, and pantoprazole. Additionally, I am having her maintain her diphenhydrAMINE, ibuprofen, busPIRone, topiramate, albuterol, methocarbamol, ferrous sulfate, amoxicillin, and nystatin ointment.  Meds ordered this encounter  Medications   metoprolol succinate (TOPROL-XL) 25 MG 24 hr tablet    Sig: Take 1 tablet (25 mg total) by mouth daily.    Dispense:  90 tablet    Refill:  3   amLODipine (NORVASC) 10 MG tablet    Sig: Take 1 tablet (10 mg total) by mouth daily.  Dispense:  30 tablet    Refill:  0   estradiol (CLIMARA - DOSED IN MG/24 HR) 0.025 mg/24hr patch    Sig: Place 1 patch (0.025 mg total) onto the skin once a week.    Dispense:  4 patch    Refill:  12   pantoprazole (PROTONIX) 40 MG tablet    Sig: Take 1 tablet (40 mg total) by mouth daily.    Dispense:  30 tablet    Refill:  3     Teena Dunk, NP

## 2021-08-07 LAB — CBC WITH DIFFERENTIAL/PLATELET
Basophils Absolute: 0.1 10*3/uL (ref 0.0–0.2)
Basos: 1 %
EOS (ABSOLUTE): 0.2 10*3/uL (ref 0.0–0.4)
Eos: 4 %
Hematocrit: 27.7 % — ABNORMAL LOW (ref 34.0–46.6)
Hemoglobin: 7.7 g/dL — ABNORMAL LOW (ref 11.1–15.9)
Immature Grans (Abs): 0 10*3/uL (ref 0.0–0.1)
Immature Granulocytes: 0 %
Lymphocytes Absolute: 2 10*3/uL (ref 0.7–3.1)
Lymphs: 39 %
MCH: 19.7 pg — ABNORMAL LOW (ref 26.6–33.0)
MCHC: 27.8 g/dL — ABNORMAL LOW (ref 31.5–35.7)
MCV: 71 fL — ABNORMAL LOW (ref 79–97)
Monocytes Absolute: 0.5 10*3/uL (ref 0.1–0.9)
Monocytes: 10 %
Neutrophils Absolute: 2.3 10*3/uL (ref 1.4–7.0)
Neutrophils: 46 %
Platelets: 630 10*3/uL — ABNORMAL HIGH (ref 150–450)
RBC: 3.91 x10E6/uL (ref 3.77–5.28)
RDW: 20.3 % — ABNORMAL HIGH (ref 11.7–15.4)
WBC: 5.1 10*3/uL (ref 3.4–10.8)

## 2021-08-07 LAB — IRON,TIBC AND FERRITIN PANEL
Ferritin: 30 ng/mL (ref 15–150)
Iron Saturation: 3 % — CL (ref 15–55)
Iron: 15 ug/dL — ABNORMAL LOW (ref 27–159)
Total Iron Binding Capacity: 455 ug/dL — ABNORMAL HIGH (ref 250–450)
UIBC: 440 ug/dL — ABNORMAL HIGH (ref 131–425)

## 2021-08-26 ENCOUNTER — Other Ambulatory Visit: Payer: Self-pay | Admitting: Nurse Practitioner

## 2021-08-26 DIAGNOSIS — N898 Other specified noninflammatory disorders of vagina: Secondary | ICD-10-CM

## 2021-08-28 ENCOUNTER — Other Ambulatory Visit: Payer: Self-pay | Admitting: Nurse Practitioner

## 2021-08-28 DIAGNOSIS — I10 Essential (primary) hypertension: Secondary | ICD-10-CM

## 2021-09-03 ENCOUNTER — Other Ambulatory Visit: Payer: Self-pay

## 2021-09-03 ENCOUNTER — Ambulatory Visit (INDEPENDENT_AMBULATORY_CARE_PROVIDER_SITE_OTHER): Payer: Medicaid Other | Admitting: Nurse Practitioner

## 2021-09-03 ENCOUNTER — Encounter: Payer: Self-pay | Admitting: Nurse Practitioner

## 2021-09-03 VITALS — BP 130/73 | HR 63 | Temp 98.4°F | Ht 61.5 in | Wt 136.4 lb

## 2021-09-03 DIAGNOSIS — R200 Anesthesia of skin: Secondary | ICD-10-CM

## 2021-09-03 DIAGNOSIS — D509 Iron deficiency anemia, unspecified: Secondary | ICD-10-CM | POA: Diagnosis not present

## 2021-09-03 DIAGNOSIS — M2559 Pain in other specified joint: Secondary | ICD-10-CM | POA: Diagnosis not present

## 2021-09-03 DIAGNOSIS — N898 Other specified noninflammatory disorders of vagina: Secondary | ICD-10-CM

## 2021-09-03 DIAGNOSIS — R6882 Decreased libido: Secondary | ICD-10-CM | POA: Diagnosis not present

## 2021-09-03 DIAGNOSIS — Z0000 Encounter for general adult medical examination without abnormal findings: Secondary | ICD-10-CM | POA: Diagnosis not present

## 2021-09-03 DIAGNOSIS — L299 Pruritus, unspecified: Secondary | ICD-10-CM

## 2021-09-03 LAB — POCT GLYCOSYLATED HEMOGLOBIN (HGB A1C)
HbA1c POC (<> result, manual entry): 4.9 % (ref 4.0–5.6)
HbA1c, POC (controlled diabetic range): 4.9 % (ref 0.0–7.0)
HbA1c, POC (prediabetic range): 4.9 % — AB (ref 5.7–6.4)
Hemoglobin A1C: 4.9 % (ref 4.0–5.6)

## 2021-09-03 MED ORDER — HYDROXYZINE HCL 10 MG PO TABS: 10.0000 mg | ORAL_TABLET | Freq: Three times a day (TID) | ORAL | 0 refills | Status: DC | PRN

## 2021-09-03 NOTE — Patient Instructions (Signed)
You were seen today in the [facility] for reevaluation of anemia and joint pain. Labs were collected, results will be available via MyChart or, if abnormal, you will be contacted by clinic staff. You were prescribed medications, please take as directed. Please follow up in 3 mths for Pap smear. ?

## 2021-09-03 NOTE — Progress Notes (Signed)
? ?[facility] ?[address] ?[place], [place]  [address] ?Phone:  [phone]   Fax:  [phone] ?Subjective:  ? Patient ID: Kayla Dunn, female    DOB: Apr 17, 1981, 41 y.o.   MRN: [id number] ? ?Chief Complaint  ?Patient presents with  ? Follow-up  ?  Pt stated she is concern about the estrogen she broke out in a rash,red and itchys also benadryl is not working any more   ? ?HPI ?Kayla Dunn 41 y.o. female  has a past medical history of Anemia, Arthritis, Bronchitis, Depression, GERD (gastroesophageal reflux disease), Heart murmur, Hypertension, Insomnia, Insomnia, Migraines, Pancreatitis, Perimenopausal, PID (acute pelvic inflammatory disease), Pregnancy induced hypertension, Preterm labor, PTSD (post-traumatic stress disorder), and Seasonal allergies. To the [facility] reevaluation of chronic illness. ? ?Patient states that symptoms have improved since starting medications. Patient states that she no longer has vaginal dryness and has been able to have sexual intercourse without difficulty. Endorses increased libido. Only verbalized side effect of medication is rash to the site of patch, with some itching on buttocks. Indicates that the rash is not very bothersome and typically improves with benadryl.  ? ?Verbalizes having continued wrist pain and overall joint pain. States that pain has seemingly worsened since last visit. She is unable to grip items, heavy or light, intermittently throughout the day. States that she also has intermittent numbness in the RUE. Endorses continued compliance with all medications. Denies any other concerns during today's visit. ? ?Denies any fatigue, chest pain, shortness of breath, HA or dizziness. Denies any blurred vision, numbness or tingling. ? ?Past Medical History:  ?Diagnosis Date  ? Anemia   ? Arthritis   ? Bronchitis   ? Depression   ? no meds, fine right now  ? GERD (gastroesophageal reflux disease)   ? Heart murmur   ? Hypertension   ? "havent  been taking my medicine in awhile"  ? Insomnia   ? Insomnia   ? Migraines   ? Pancreatitis   ? Perimenopausal   ? PID (acute pelvic inflammatory disease)   ? Pregnancy induced hypertension   ? Preterm labor   ? PTSD (post-traumatic stress disorder)   ? Seasonal allergies   ? ? ?Past Surgical History:  ?Procedure Laterality Date  ? CESAREAN SECTION    ? CHOLECYSTECTOMY N/A 08/27/2013  ? Procedure: LAPAROSCOPIC CHOLECYSTECTOMY WITH INTRAOPERATIVE CHOLANGIOGRAM;  Surgeon: Valarie Merino, MD;  Location: [facility];  Service: General;  Laterality: N/A;  ? DILATION AND CURETTAGE OF UTERUS    ? OVARY SURGERY    ? TUBAL LIGATION    ? ? ?Family History  ?Problem Relation Age of Onset  ? Hypertension Father   ? Liver disease Father   ? Stroke Father   ? Cancer Mother   ?     colon  ? Arthritis Mother   ? Diabetes Paternal Grandmother   ? Hypertension Paternal Grandmother   ? Heart disease Paternal Grandmother   ? ? ?Social History  ? ?Socioeconomic History  ? Marital status: Single  ?  Spouse name: Not on file  ? Number of children: Not on file  ? Years of education: Not on file  ? Highest education level: Not on file  ?Occupational History  ? Not on file  ?Tobacco Use  ? Smoking status: Light Smoker  ?  Packs/day: 0.25  ?  Years: 12.00  ?  Pack years: 3.00  ?  Types: Cigarettes  ? Smokeless tobacco: Never  ?  Vaping Use  ? Vaping Use: Never used  ?Substance and Sexual Activity  ? Alcohol use: No  ? Drug use: Yes  ?  Types: Marijuana  ?  Comment: 4-5 times a week  ? Sexual activity: Yes  ?  Birth control/protection: Surgical  ?Other Topics Concern  ? Not on file  ?Social History Narrative  ? Pt is smoking delta 8  ? ?Social Determinants of Health  ? ?Financial Resource Strain: Not on file  ?Food Insecurity: Not on file  ?Transportation Needs: Not on file  ?Physical Activity: Not on file  ?Stress: Not on file  ?Social Connections: Not on file  ?Intimate Partner Violence: Not on file  ? ? ?Outpatient Medications Prior to Visit   ?Medication Sig Dispense Refill  ? amLODipine (NORVASC) 10 MG tablet TAKE 1 TABLET BY MOUTH EVERY DAY 30 tablet 0  ? diphenhydrAMINE (BENADRYL) 25 MG tablet Take 25 mg by mouth every 6 (six) hours as needed for allergies.    ? estradiol (CLIMARA - DOSED IN MG/24 HR) 0.025 mg/24hr patch PLACE 1 PATCH (0.025 MG TOTAL) ONTO THE SKIN ONCE A WEEK. 12 patch 5  ? ferrous sulfate 325 (65 FE) MG tablet Take 1 tablet (325 mg total) by mouth daily. 30 tablet 3  ? methocarbamol (ROBAXIN-750) 750 MG tablet Take 1 tablet (750 mg total) by mouth every 6 (six) hours as needed for muscle spasms. 30 tablet 0  ? metoprolol succinate (TOPROL-XL) 25 MG 24 hr tablet Take 1 tablet (25 mg total) by mouth daily. 90 tablet 3  ? nystatin ointment (MYCOSTATIN) SMARTSIG:Liberally Topical 4 Times Daily    ? pantoprazole (PROTONIX) 40 MG tablet Take 1 tablet (40 mg total) by mouth daily. 30 tablet 3  ? albuterol (PROVENTIL) (2.5 MG/3ML) 0.083% nebulizer solution Take 3 mLs (2.5 mg total) by nebulization every 6 (six) hours as needed for wheezing or shortness of breath. (Patient not taking: Reported on 08/06/2021) 150 mL 0  ? amoxicillin (AMOXIL) 500 MG capsule Take 500 mg by mouth every 8 (eight) hours.    ? busPIRone (BUSPAR) 7.5 MG tablet Take 1 tablet (7.5 mg total) by mouth 2 (two) times daily. (Patient not taking: Reported on 08/06/2021) 60 tablet 2  ? ibuprofen (ADVIL,MOTRIN) 600 MG tablet Take 1 tablet (600 mg total) by mouth every 6 (six) hours as needed for moderate pain. (Patient not taking: Reported on 08/06/2021) 30 tablet 0  ? topiramate (TOPAMAX) 25 MG tablet Take 1 tablet (25 mg total) by mouth 2 (two) times daily. Start with 25 mg po QHS x 1 week. Then increase to BID after. (Patient not taking: Reported on 08/06/2021) 60 tablet 2  ? ?No facility-administered medications prior to visit.  ? ? ?Allergies  ?Allergen Reactions  ? Aspirin Anaphylaxis  ? Hydromorphone Hcl Anaphylaxis  ? Aloe Vera Hives and Itching  ? Dust Mite Extract   ?   Seasonal allergies   ? Morphine Hives  ? Shrimp [Shellfish Allergy] Swelling  ? ? ?Review of Systems  ?Constitutional:  Negative for chills, fever and malaise/fatigue.  ?Respiratory:  Negative for cough and shortness of breath.   ?Cardiovascular:  Negative for chest pain, palpitations and leg swelling.  ?Gastrointestinal:  Negative for abdominal pain, blood in stool, constipation, diarrhea, nausea and vomiting.  ?Genitourinary:  Negative for dysuria, flank pain, frequency, hematuria and urgency.  ?     See HPI  ?Musculoskeletal:  Positive for joint pain. Negative for back pain, myalgias and neck pain.  ?Skin:  Positive for itching and rash.  ?Neurological:  Positive for sensory change. Negative for dizziness, tingling, tremors, speech change, focal weakness, seizures, loss of consciousness, weakness and headaches.  ?Psychiatric/Behavioral:  Negative for depression. The patient is not nervous/anxious.   ?All other systems reviewed and are negative. ? ?   ?Objective:  ?  ?Physical Exam ?Vitals reviewed.  ?Constitutional:   ?   General: She is not in acute distress. ?   Appearance: Normal appearance. She is normal weight.  ?HENT:  ?   Head: Normocephalic.  ?Cardiovascular:  ?   Rate and Rhythm: Normal rate and regular rhythm.  ?   Pulses: Normal pulses.  ?   Heart sounds: Normal heart sounds.  ?   Comments: No obvious peripheral edema ?Pulmonary:  ?   Effort: Pulmonary effort is normal.  ?   Breath sounds: Normal breath sounds.  ?Musculoskeletal:     ?   General: No swelling, tenderness, deformity or signs of injury. Normal range of motion.  ?   Right lower leg: No edema.  ?   Left lower leg: No edema.  ?   Comments: FROM intact in all extremities  ?Skin: ?   General: Skin is warm and dry.  ?   Capillary Refill: Capillary refill takes less than 2 seconds.  ?Neurological:  ?   General: No focal deficit present.  ?   Mental Status: She is alert and oriented to person, place, and time.  ?Psychiatric:     ?   Mood and  Affect: Mood normal.     ?   Behavior: Behavior normal.     ?   Thought Content: Thought content normal.     ?   Judgment: Judgment normal.  ? ? ?BP 130/73 (BP Location: Left Arm, Patient Position: Sitting, C

## 2021-09-04 LAB — CBC WITH DIFFERENTIAL/PLATELET
Basophils Absolute: 0.1 10*3/uL (ref 0.0–0.2)
Basos: 1 %
EOS (ABSOLUTE): 0.2 10*3/uL (ref 0.0–0.4)
Eos: 2 %
Hematocrit: 31.7 % — ABNORMAL LOW (ref 34.0–46.6)
Hemoglobin: 9.3 g/dL — ABNORMAL LOW (ref 11.1–15.9)
Immature Grans (Abs): 0 10*3/uL (ref 0.0–0.1)
Immature Granulocytes: 0 %
Lymphocytes Absolute: 2 10*3/uL (ref 0.7–3.1)
Lymphs: 25 %
MCH: 21.9 pg — ABNORMAL LOW (ref 26.6–33.0)
MCHC: 29.3 g/dL — ABNORMAL LOW (ref 31.5–35.7)
MCV: 75 fL — ABNORMAL LOW (ref 79–97)
Monocytes Absolute: 0.9 10*3/uL (ref 0.1–0.9)
Monocytes: 11 %
Neutrophils Absolute: 4.8 10*3/uL (ref 1.4–7.0)
Neutrophils: 61 %
Platelets: 416 10*3/uL (ref 150–450)
RBC: 4.25 x10E6/uL (ref 3.77–5.28)
RDW: 24.1 % — ABNORMAL HIGH (ref 11.7–15.4)
WBC: 8 10*3/uL (ref 3.4–10.8)

## 2021-09-14 ENCOUNTER — Other Ambulatory Visit: Payer: Self-pay

## 2021-09-14 ENCOUNTER — Encounter: Payer: Self-pay | Admitting: Orthopaedic Surgery

## 2021-09-14 ENCOUNTER — Ambulatory Visit: Payer: Medicaid Other | Admitting: Orthopaedic Surgery

## 2021-09-14 ENCOUNTER — Ambulatory Visit (INDEPENDENT_AMBULATORY_CARE_PROVIDER_SITE_OTHER): Payer: Medicaid Other

## 2021-09-14 DIAGNOSIS — M5412 Radiculopathy, cervical region: Secondary | ICD-10-CM

## 2021-09-14 DIAGNOSIS — M545 Low back pain, unspecified: Secondary | ICD-10-CM

## 2021-09-14 MED ORDER — PREDNISONE 10 MG (21) PO TBPK: ORAL_TABLET | ORAL | 0 refills | Status: DC
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
## 2021-09-25 ENCOUNTER — Other Ambulatory Visit: Payer: Self-pay | Admitting: Nurse Practitioner

## 2021-09-25 ENCOUNTER — Other Ambulatory Visit: Payer: Medicaid Other

## 2021-09-25 DIAGNOSIS — I10 Essential (primary) hypertension: Secondary | ICD-10-CM

## 2021-09-26 ENCOUNTER — Ambulatory Visit
Admission: RE | Admit: 2021-09-26 | Discharge: 2021-09-26 | Disposition: A | Discharge status: Home / Self Care | Payer: Medicaid Other | Source: Ambulatory Visit | Attending: Orthopaedic Surgery | Admitting: Orthopaedic Surgery

## 2021-09-26 DIAGNOSIS — R200 Anesthesia of skin: Secondary | ICD-10-CM | POA: Diagnosis not present

## 2021-09-26 DIAGNOSIS — M2578 Osteophyte, vertebrae: Secondary | ICD-10-CM | POA: Diagnosis not present

## 2021-09-26 DIAGNOSIS — M542 Cervicalgia: Secondary | ICD-10-CM | POA: Diagnosis not present

## 2021-09-26 DIAGNOSIS — M545 Low back pain, unspecified: Secondary | ICD-10-CM

## 2021-09-26 DIAGNOSIS — M5412 Radiculopathy, cervical region: Secondary | ICD-10-CM
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
## 2021-09-26 IMAGING — MR MR LUMBAR SPINE W/O CM
4 of 6 series · 18 of 48 positions shown · non-contrast
Comparison: Radiography [DATE]

CLINICAL DATA: Neck pain. Bilateral lower extremity radicular pain,
numbness and weakness.

EXAM:
MRI LUMBAR SPINE WITHOUT CONTRAST
TECHNIQUE: Multiplanar, multisequence MR imaging of the lumbar spine was
performed. No intravenous contrast was administered.

[Series 5: T2 · sagittal · 4.0mm · 0.73mm/px · 5 of 15 slices shown (1 of 2)]
[im 1/15]
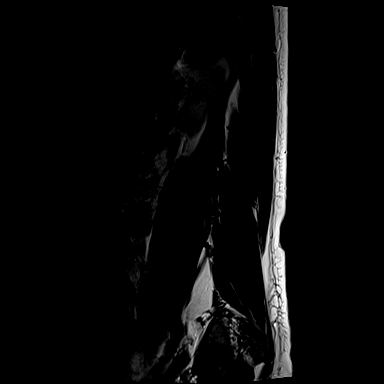
[im 4/15]
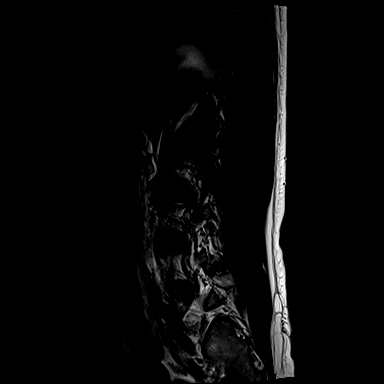
[im 8/15]
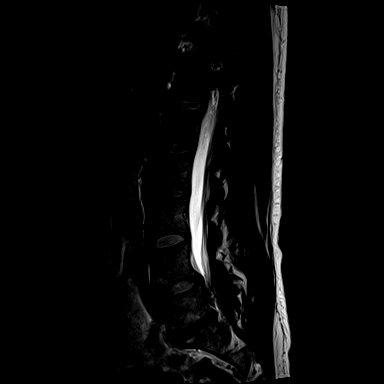
[im 11/15]
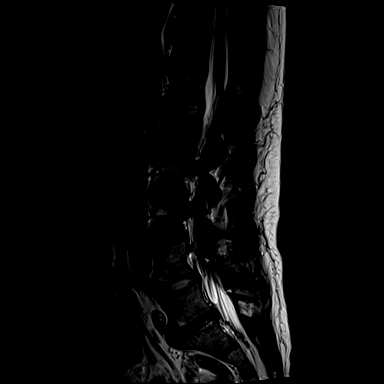
[im 15/15]
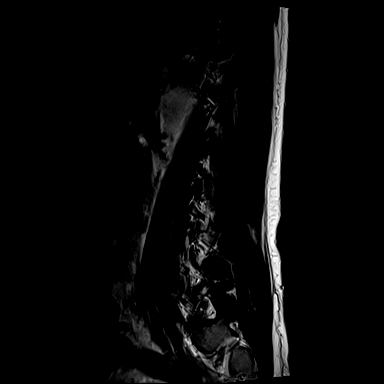

[Series 6: T1 · sagittal · 4.0mm · 0.73mm/px · 3 of 15 slices shown (1 of 2)]
[im 1/15]
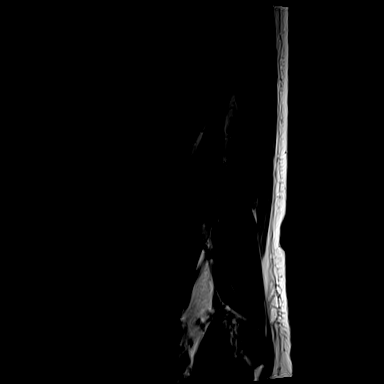
[im 8/15]
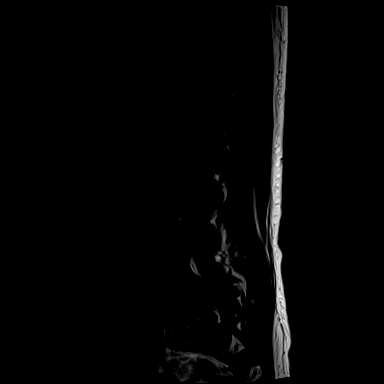
[im 15/15]
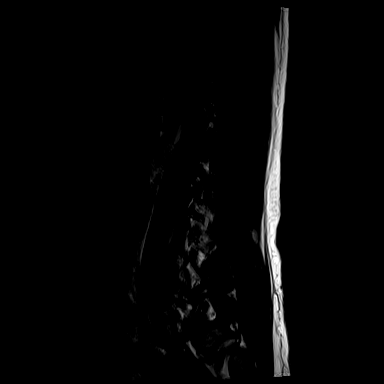

[Series 10: T1 · axial · 4.0mm · 0.28mm/px · z∈[-55,+74]mm · 3 of 34 slices shown (2 of 2)]
[im 6/34]
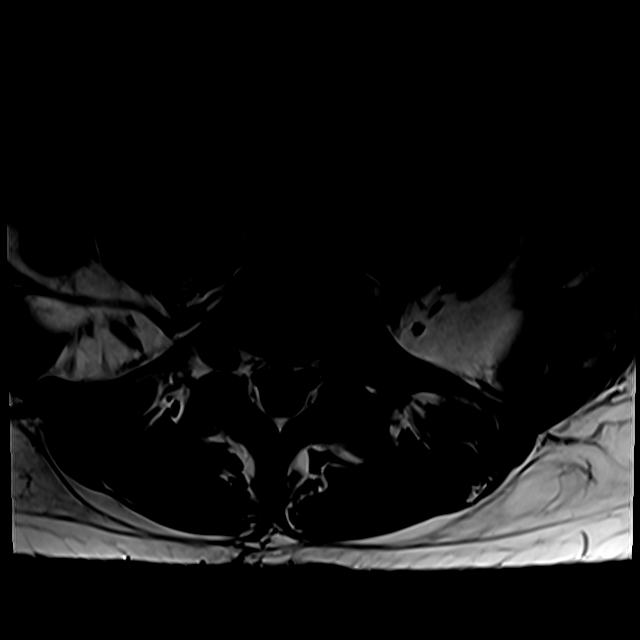
[im 17/34]
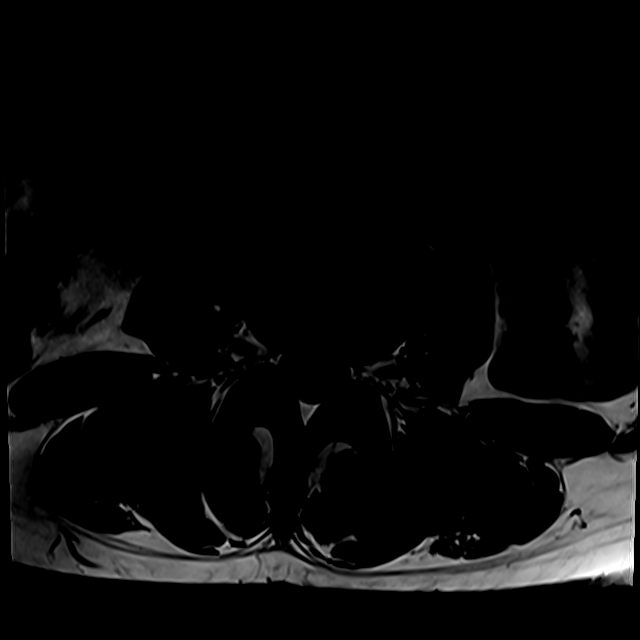
[im 28/34]
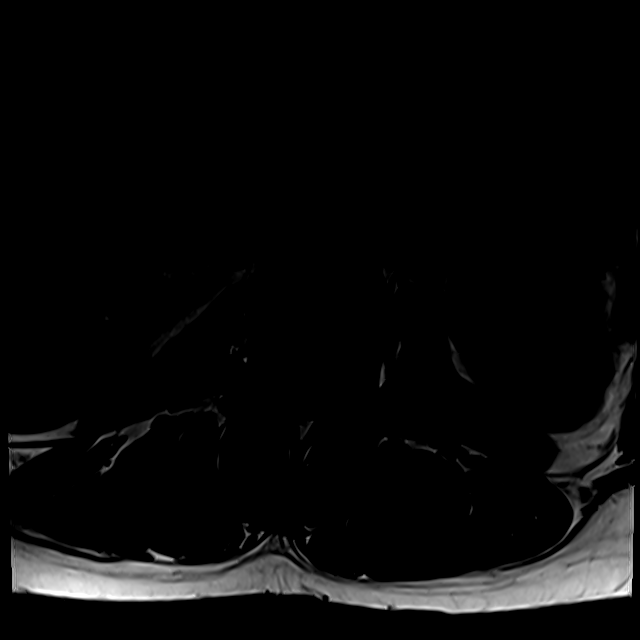

[Series 13: T2 · axial · 4.0mm · 0.28mm/px · z∈[-79,+74]mm · 7 of 34 slices shown (2 of 2)]
[im 1/34]
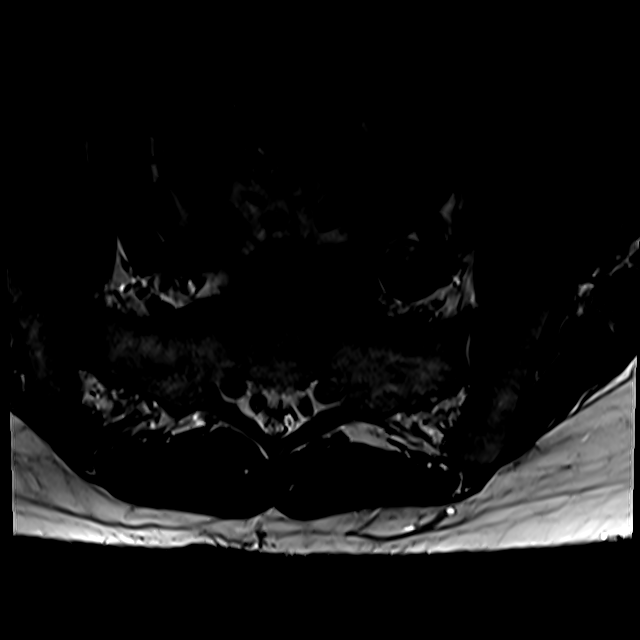
[im 6/34]
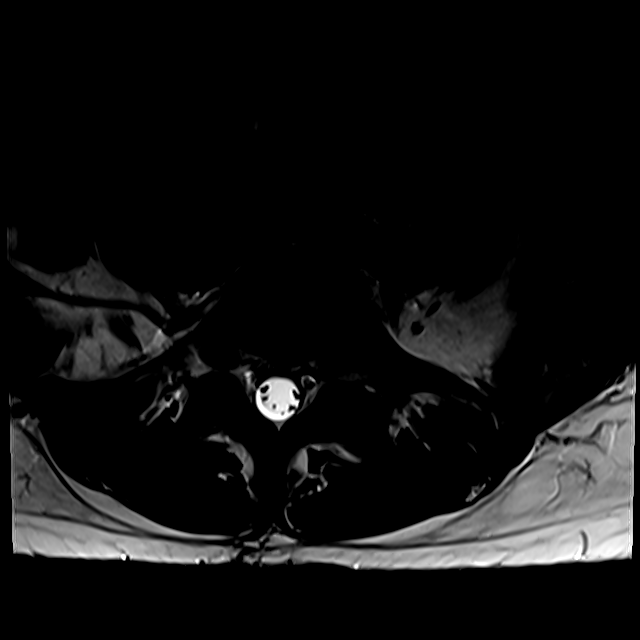
[im 12/34]
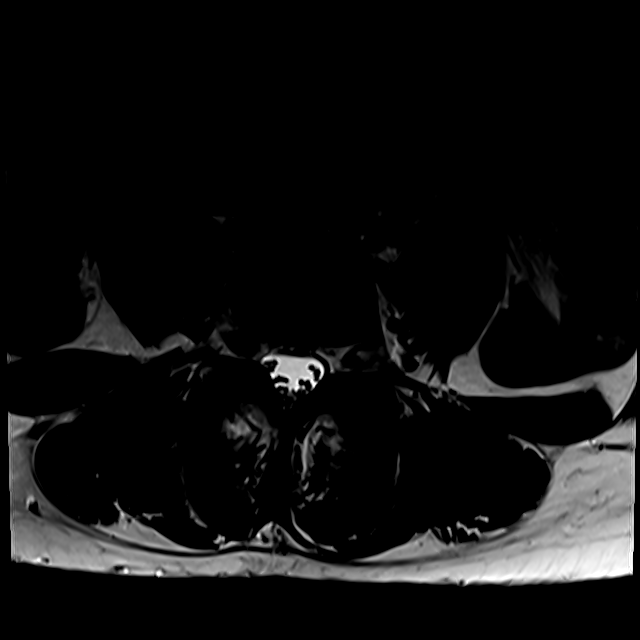
[im 14/34]
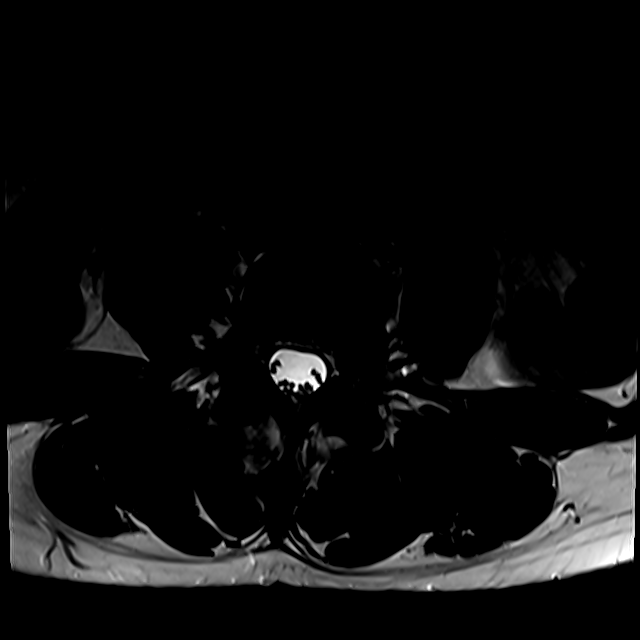
[im 17/34]
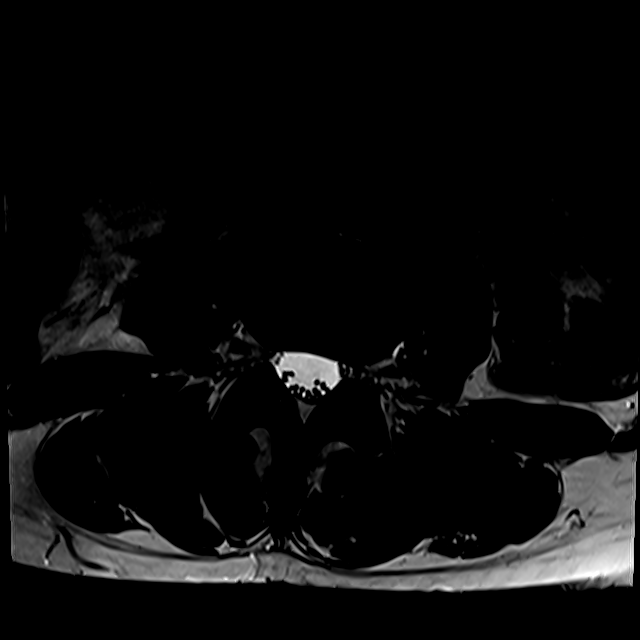
[im 20/34]
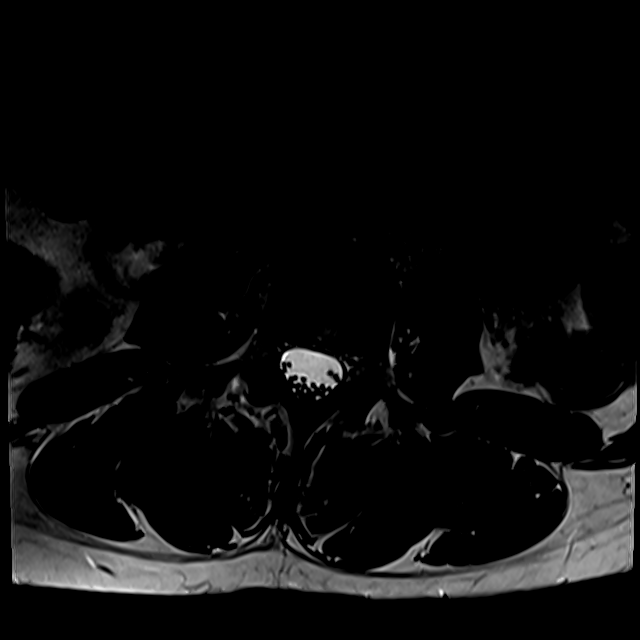
[im 28/34]
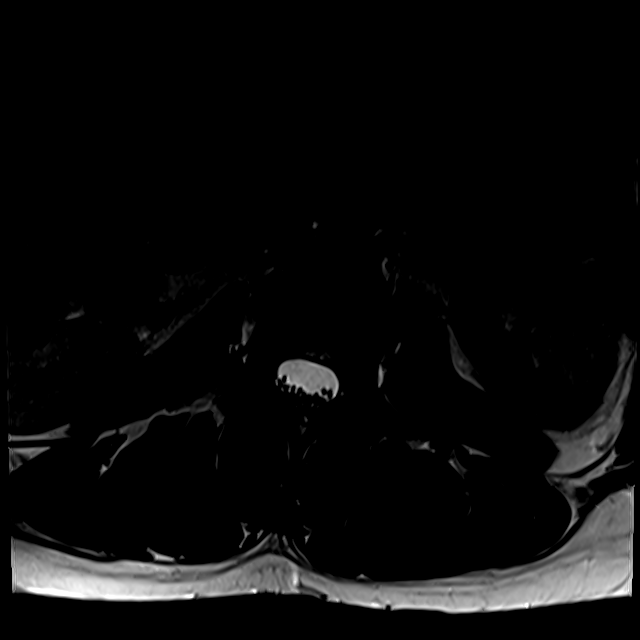

[18 of 48 positions shown; findings below may reference images not displayed]

FINDINGS: Segmentation:  5 lumbar type vertebral bodies.

Alignment: Scoliotic curvature convex to the left with the apex at
L3. 1 mm degenerative anterolisthesis L4-5.

Vertebrae: No traumatic finding. Chronic discogenic endplate changes
at L5-S1 without active edema.

Conus medullaris and cauda equina: Conus extends to the T12-L1
level. Conus and cauda equina appear normal.

Paraspinal and other soft tissues: Negative

Disc levels:

T11-12 and T12-L1: Normal

L1-2: No disc or facet abnormality.  No stenosis.

L2-3: No disc or facet abnormality.  No stenosis.

L3-4: No disc or facet abnormality.  No stenosis.

L4-5: Bilateral facet arthropathy with gaping, fluid-filled facet
joints. 1 mm of anterolisthesis that would likely worsen with
standing or flexion. Circumferential bulging of the disc. No
compressive stenosis in this position, but that might change if
subluxation increased.

L5-S1: Chronic disc degeneration with bulging of the disc. Mild
facet hypertrophy. No compressive canal or foraminal stenosis.
IMPRESSION: L4-5: Chronic facet arthropathy with gaping, fluid-filled facet
joints. 1 mm of anterolisthesis in the supine position. Bulging of
the disc. No apparent compressive stenosis. This appearance could
worsen with standing or flexion.

L5-S1: Chronic disc degeneration with circumferential bulging. Mild
facet osteoarthritis. No apparent compressive canal or foraminal
narrowing. Findings at this level could certainly relate to low back
pain.

Scoliotic curvature convex to the left with the apex at L3.

## 2021-09-28 ENCOUNTER — Telehealth: Payer: Self-pay

## 2021-09-28 NOTE — Telephone Encounter (Signed)
Hydroxyzine.

## 2021-09-29 ENCOUNTER — Other Ambulatory Visit: Payer: Self-pay | Admitting: Nurse Practitioner

## 2021-09-29 DIAGNOSIS — L299 Pruritus, unspecified: Secondary | ICD-10-CM

## 2021-09-29 MED ORDER — HYDROXYZINE HCL 10 MG PO TABS: 10.0000 mg | ORAL_TABLET | Freq: Three times a day (TID) | ORAL | 0 refills | Status: DC | PRN

## 2021-10-06 ENCOUNTER — Ambulatory Visit: Payer: Medicaid Other | Admitting: Orthopaedic Surgery

## 2021-10-06 ENCOUNTER — Encounter: Payer: Self-pay | Admitting: Orthopaedic Surgery

## 2021-10-06 DIAGNOSIS — M545 Low back pain, unspecified: Secondary | ICD-10-CM

## 2021-10-06 DIAGNOSIS — G9589 Other specified diseases of spinal cord: Secondary | ICD-10-CM

## 2021-10-06 DIAGNOSIS — G8929 Other chronic pain: Secondary | ICD-10-CM | POA: Diagnosis not present

## 2021-10-06 MED ORDER — PREDNISONE 10 MG (21) PO TBPK: ORAL_TABLET | ORAL | 0 refills | Status: DC

## 2021-10-06 NOTE — Progress Notes (Signed)
? ?Office Visit Note ?  ?Patient: Kayla Dunn           ?Date of Birth: [birth year]           ?MRN: [id number] ?Visit Date: 10/06/2021 ?             ?Requested by: Orion Crook I, NP ?[address], [address] ?[place],  [place] [address] ?PCP: Orion Crook I, NP ? ? ?Assessment & Plan: ?Visit Diagnoses:  ?1. Cervical cord myelomalacia (HCC)   ?2. Chronic low back pain, unspecified back pain laterality, unspecified whether sciatica present   ? ? ?Plan: Impression is chronic low back pain and neck pain with bilateral upper and lower extremity radiculopathy.  Recent MRI of the cervical spine shows myelomalacia affecting the cord at C3-4 and from C5-6 to C7-T1.  Also noted is degenerative spondylosis and facet arthritis with foraminal narrowing on the right at C3-4 all the way to C7-T1.  Lumbar spine MRI shows facet arthropathy with bulging disc at L4-5 as well as L5-S1. At this point, I would like to make a referral to neurosurgery for further evaluation and treatment recommendation.  Call with concerns or questions in the meantime. ? ?Follow-Up Instructions: Return if symptoms worsen or fail to improve.  ? ?Orders:  ?No orders of the defined types were placed in this encounter. ? ?Meds ordered this encounter  ?Medications  ? predniSONE (STERAPRED UNI-PAK 21 TAB) 10 MG (21) TBPK tablet  ?  Sig: Take as directed  ?  Dispense:  21 tablet  ?  Refill:  0  ? ? ? ? Procedures: ?No procedures performed ? ? ?Clinical Data: ?No additional findings. ? ? ?Subjective: ?Chief Complaint  ?Patient presents with  ? Neck - Pain  ? ? ?HPI patient is a pleasant 41 year old female who comes in today to discuss MRI results of the cervical and lumbar spine.  She has been dealing with chronic neck and back pain as well as bilateral upper and lower extremity radiculopathy for years.  Recent MRI of the cervical spine showed myelomalacia affecting the cord at C3-4 and from C5-6 to C7-T1.  Also noted is degenerative spondylosis and facet  arthritis with foraminal narrowing on the right at C3-4 all the way to C7-T1.  Lumbar spine MRI shows facet arthropathy with bulging disc at L4-5 as well as L5-S1. ? ? ? ? ?Objective: ?Vital Signs: There were no vitals taken for this visit. ? ? ? ?Ortho Exam unchanged cervical and lumbar spine exams ? ?Specialty Comments:  ?No specialty comments available. ? ?Imaging: ?No new imaging ? ? ?PMFS History: ?Patient Active Problem List  ? Diagnosis Date Noted  ? Microcytic anemia 03/09/2018  ? Thrush, oral 03/09/2018  ? Gastritis 03/03/2014  ? Bilateral wrist pain 03/03/2014  ? Borderline hypertension 03/03/2014  ? Cholecystitis 08/27/2013  ? TWIN GESTATION 04/28/2010  ? GERD 03/07/2008  ? ANEMIA, IRON DEFICIENCY, UNSPEC. 08/17/2006  ? DEPRESSION, MAJOR, RECURRENT 08/17/2006  ? TOBACCO DEPENDENCE 08/17/2006  ? MIGRAINE, UNSPEC., W/O INTRACTABLE MIGRAINE 08/17/2006  ? INSOMNIA NOS 08/17/2006  ? ?Past Medical History:  ?Diagnosis Date  ? Anemia   ? Arthritis   ? Bronchitis   ? Depression   ? no meds, fine right now  ? GERD (gastroesophageal reflux disease)   ? Heart murmur   ? Hypertension   ? "havent been taking my medicine in awhile"  ? Insomnia   ? Insomnia   ? Migraines   ? Pancreatitis   ? Perimenopausal   ?  PID (acute pelvic inflammatory disease)   ? Pregnancy induced hypertension   ? Preterm labor   ? PTSD (post-traumatic stress disorder)   ? Seasonal allergies   ?  ?Family History  ?Problem Relation Age of Onset  ? Hypertension Father   ? Liver disease Father   ? Stroke Father   ? Cancer Mother   ?     colon  ? Arthritis Mother   ? Diabetes Paternal Grandmother   ? Hypertension Paternal Grandmother   ? Heart disease Paternal Grandmother   ?  ?Past Surgical History:  ?Procedure Laterality Date  ? CESAREAN SECTION    ? CHOLECYSTECTOMY N/A 08/27/2013  ? Procedure: LAPAROSCOPIC CHOLECYSTECTOMY WITH INTRAOPERATIVE CHOLANGIOGRAM;  Surgeon: Valarie Merino, MD;  Location: [facility];  Service: General;  Laterality: N/A;  ?  DILATION AND CURETTAGE OF UTERUS    ? OVARY SURGERY    ? TUBAL LIGATION    ? ?Social History  ? ?Occupational History  ? Not on file  ?Tobacco Use  ? Smoking status: Light Smoker  ?  Packs/day: 0.25  ?  Years: 12.00  ?  Pack years: 3.00  ?  Types: Cigarettes  ? Smokeless tobacco: Never  ?Vaping Use  ? Vaping Use: Never used  ?Substance and Sexual Activity  ? Alcohol use: No  ? Drug use: Yes  ?  Types: Marijuana  ?  Comment: 4-5 times a week  ? Sexual activity: Yes  ?  Birth control/protection: Surgical  ? ? ? ? ? ? ?

## 2021-10-07 ENCOUNTER — Other Ambulatory Visit: Payer: Self-pay

## 2021-10-07 DIAGNOSIS — G8929 Other chronic pain: Secondary | ICD-10-CM

## 2021-10-07 DIAGNOSIS — G9589 Other specified diseases of spinal cord: Secondary | ICD-10-CM

## 2021-10-13 ENCOUNTER — Other Ambulatory Visit: Payer: Self-pay | Admitting: Neurosurgery

## 2021-10-13 DIAGNOSIS — G992 Myelopathy in diseases classified elsewhere: Secondary | ICD-10-CM | POA: Diagnosis not present

## 2021-10-13 DIAGNOSIS — M4802 Spinal stenosis, cervical region: Secondary | ICD-10-CM | POA: Diagnosis not present

## 2021-10-13 DIAGNOSIS — M4310 Spondylolisthesis, site unspecified: Secondary | ICD-10-CM | POA: Diagnosis not present

## 2021-10-13 DIAGNOSIS — Z6827 Body mass index (BMI) 27.0-27.9, adult: Secondary | ICD-10-CM | POA: Diagnosis not present

## 2021-10-13 DIAGNOSIS — M40202 Unspecified kyphosis, cervical region: Secondary | ICD-10-CM | POA: Diagnosis not present

## 2021-10-19 NOTE — Pre-Procedure Instructions (Signed)
Surgical Instructions ? ? ? Your procedure is scheduled on Monday, May 8th. ? Report to [facility] Main Entrance "A" at 09:00 A.M., then check in with the Admitting office. ? Call this number if you have problems the morning of surgery: ? [phone] ? ? If you have any questions prior to your surgery date call [phone]: Open Monday-Friday 8am-4pm ? ? ? Remember: ? Do not eat or drink after midnight the night before your surgery ? ?  ? Take these medicines the morning of surgery with A SIP OF WATER  ?amLODipine (NORVASC) ?metoprolol succinate (TOPROL-XL)  ?pantoprazole (PROTONIX)  ? ?If needed: ?albuterol (PROVENTIL) nebulizer ?hydrOXYzine (ATARAX) ?methocarbamol (ROBAXIN-750)  ? ?As of today, STOP taking any Aspirin (unless otherwise instructed by your surgeon) Aleve, Naproxen, Ibuprofen, Motrin, Advil, Goody's, BC's, all herbal medications, fish oil, and all vitamins. ?         ?           ?Do NOT Smoke (Tobacco/Vaping) for 24 hours prior to your procedure. ? ?If you use a CPAP at night, you may bring your mask/headgear for your overnight stay. ?  ?Contacts, glasses, piercing's, hearing aid's, dentures or partials may not be worn into surgery, please bring cases for these belongings.  ?  ?For patients admitted to the hospital, discharge time will be determined by your treatment team. ?  ?Patients discharged the day of surgery will not be allowed to drive home, and someone needs to stay with them for 24 hours. ? ?SURGICAL WAITING ROOM VISITATION ?Patients having surgery or a procedure may have two support people in the waiting room. These visitors may be switched out with other visitors if needed. ?Children under the age of 79 must have an adult accompany them who is not the patient. ?If the patient needs to stay at the hospital during part of their recovery, the visitor guidelines for inpatient rooms apply. ? ?Please refer to the [facility] website for the visitor guidelines for Inpatients (after your surgery  is over and you are in a regular room).  ? ? ?Special instructions:   ?[facility]- Preparing For Surgery ? ?Before surgery, you can play an important role. Because skin is not sterile, your skin needs to be as free of germs as possible. You can reduce the number of germs on your skin by washing with CHG (chlorahexidine gluconate) Soap before surgery.  CHG is an antiseptic cleaner which kills germs and bonds with the skin to continue killing germs even after washing.   ? ?Oral Hygiene is also important to reduce your risk of infection.  Remember - BRUSH YOUR TEETH THE MORNING OF SURGERY WITH YOUR REGULAR TOOTHPASTE ? ?Please do not use if you have an allergy to CHG or antibacterial soaps. If your skin becomes reddened/irritated stop using the CHG.  ?Do not shave (including legs and underarms) for at least 48 hours prior to first CHG shower. It is OK to shave your face. ? ?Please follow these instructions carefully. ?  ?Shower the NIGHT BEFORE SURGERY and the MORNING OF SURGERY ? ?If you chose to wash your hair, wash your hair first as usual with your normal shampoo. ? ?After you shampoo, rinse your hair and body thoroughly to remove the shampoo. ? ?Use CHG Soap as you would any other liquid soap. You can apply CHG directly to the skin and wash gently with a scrungie or a clean washcloth.  ? ?Apply the CHG Soap to your body ONLY FROM THE NECK DOWN.  Do not use on open wounds or open sores. Avoid contact with your eyes, ears, mouth and genitals (private parts). Wash Face and genitals (private parts)  with your normal soap.  ? ?Wash thoroughly, paying special attention to the area where your surgery will be performed. ? ?Thoroughly rinse your body with warm water from the neck down. ? ?DO NOT shower/wash with your normal soap after using and rinsing off the CHG Soap. ? ?Pat yourself dry with a CLEAN TOWEL. ? ?Wear CLEAN PAJAMAS to bed the night before surgery ? ?Place CLEAN SHEETS on your bed the night before your  surgery ? ?DO NOT SLEEP WITH PETS. ? ? ?Day of Surgery: ?Take a shower with CHG soap. ?Do not wear jewelry or makeup ?Do not wear lotions, powders, perfumes, or deodorant. ?Do not shave 48 hours prior to surgery.   ?Do not bring valuables to the hospital.  ?[facility] is not responsible for any belongings or valuables. ?Do not wear nail polish, gel polish, artificial nails, or any other type of covering on natural nails (fingers and toes) ?If you have artificial nails or gel coating that need to be removed by a nail salon, please have this removed prior to surgery. Artificial nails or gel coating may interfere with anesthesia's ability to adequately monitor your vital signs. ?Wear Clean/Comfortable clothing the morning of surgery ?Remember to brush your teeth WITH YOUR REGULAR TOOTHPASTE. ?  ?Please read over the following fact sheets that you were given. ? ? ? ?If you received a COVID test during your pre-op visit  it is requested that you wear a mask when out in public, stay away from anyone that may not be feeling well and notify your surgeon if you develop symptoms. If you have been in contact with anyone that has tested positive in the last 10 days please notify you surgeon.  ?

## 2021-10-20 ENCOUNTER — Other Ambulatory Visit: Payer: Self-pay

## 2021-10-20 ENCOUNTER — Encounter (HOSPITAL_COMMUNITY): Payer: Self-pay

## 2021-10-20 ENCOUNTER — Encounter (HOSPITAL_COMMUNITY)
Admission: RE | Admit: 2021-10-20 | Discharge: 2021-10-20 | Disposition: A | Discharge status: Home / Self Care | Payer: Medicaid Other | Source: Ambulatory Visit | Attending: Neurosurgery | Admitting: Neurosurgery

## 2021-10-20 VITALS — BP 129/80 | HR 80 | Temp 98.1°F | Resp 16 | Ht 61.0 in | Wt 144.0 lb

## 2021-10-20 DIAGNOSIS — Z01818 Encounter for other preprocedural examination: Secondary | ICD-10-CM

## 2021-10-20 DIAGNOSIS — D508 Other iron deficiency anemias: Secondary | ICD-10-CM | POA: Diagnosis not present

## 2021-10-20 LAB — COMPREHENSIVE METABOLIC PANEL
ALT: 17 U/L (ref 0–44)
AST: 26 U/L (ref 15–41)
Albumin: 3.6 g/dL (ref 3.5–5.0)
Alkaline Phosphatase: 56 U/L (ref 38–126)
Anion gap: 6 (ref 5–15)
BUN: 9 mg/dL (ref 6–20)
CO2: 25 mmol/L (ref 22–32)
Calcium: 8.9 mg/dL (ref 8.9–10.3)
Chloride: 107 mmol/L (ref 98–111)
Creatinine, Ser: 0.66 mg/dL (ref 0.44–1.00)
GFR, Estimated: >60 mL/min (ref >60)
Glucose, Bld: 86 mg/dL (ref 70–99)
Potassium: 4 mmol/L (ref 3.5–5.1)
Sodium: 138 mmol/L (ref 135–145)
Total Bilirubin: 0.3 mg/dL (ref 0.3–1.2)
Total Protein: 6.9 g/dL (ref 6.5–8.1)

## 2021-10-20 LAB — TYPE AND SCREEN
ABO/RH(D): O NEG
Antibody Screen: NEGATIVE

## 2021-10-20 LAB — CBC
HCT: 31.8 % — ABNORMAL LOW (ref 36.0–46.0)
Hemoglobin: 9.8 g/dL — ABNORMAL LOW (ref 12.0–15.0)
MCH: 24.6 pg — ABNORMAL LOW (ref 26.0–34.0)
MCHC: 30.8 g/dL (ref 30.0–36.0)
MCV: 79.7 fL — ABNORMAL LOW (ref 80.0–100.0)
Platelets: 480 10*3/uL — ABNORMAL HIGH (ref 150–400)
RBC: 3.99 MIL/uL (ref 3.87–5.11)
RDW: 20.7 % — ABNORMAL HIGH (ref 11.5–15.5)
WBC: 7.7 10*3/uL (ref 4.0–10.5)
nRBC: 0 % (ref 0.0–0.2)

## 2021-10-20 LAB — SURGICAL PCR SCREEN
MRSA, PCR: NEGATIVE
Staphylococcus aureus: NEGATIVE

## 2021-10-20 NOTE — Progress Notes (Addendum)
PCP - Orion Crook, NP with [facility] ?Cardiologist - Denies ? ?Chest x-ray - Not indicated ?EKG - 10/20/21 ?Stress Test - Denies ?ECHO - Denies ?Cardiac Cath - Denies ? ?Sleep Study - years ago no [facility] ? ?DM - Denies ? ?COVID TEST- not indicated ? ? ?Anesthesia review: Yes Hgb 9.8 (anemic/sickle cell pt) ? ?Patient denies shortness of breath, fever, cough and chest pain at PAT appointment ? ? ?All instructions explained to the patient, with a verbal understanding of the material. Patient agrees to go over the instructions while at home for a better understanding.  The opportunity to ask questions was provided. ? ? ?

## 2021-10-21 DIAGNOSIS — M4802 Spinal stenosis, cervical region: Secondary | ICD-10-CM | POA: Diagnosis not present

## 2021-10-22 ENCOUNTER — Other Ambulatory Visit: Payer: Self-pay | Admitting: Nurse Practitioner

## 2021-10-22 DIAGNOSIS — I10 Essential (primary) hypertension: Secondary | ICD-10-CM

## 2021-10-25 ENCOUNTER — Inpatient Hospital Stay (HOSPITAL_COMMUNITY)
Admission: RE | Admit: 2021-10-25 | Discharge: 2021-10-26 | DRG: 473 | Disposition: A | Discharge status: Home / Self Care | Payer: Medicaid Other | Attending: Neurosurgery | Admitting: Neurosurgery

## 2021-10-25 ENCOUNTER — Inpatient Hospital Stay (HOSPITAL_COMMUNITY): Payer: Medicaid Other | Admitting: Physician Assistant

## 2021-10-25 ENCOUNTER — Inpatient Hospital Stay (HOSPITAL_COMMUNITY): Payer: Medicaid Other

## 2021-10-25 ENCOUNTER — Encounter (HOSPITAL_COMMUNITY): Payer: Self-pay | Admitting: Neurosurgery

## 2021-10-25 ENCOUNTER — Inpatient Hospital Stay (HOSPITAL_COMMUNITY): Payer: Medicaid Other | Admitting: Certified Registered Nurse Anesthetist

## 2021-10-25 ENCOUNTER — Other Ambulatory Visit: Payer: Self-pay

## 2021-10-25 ENCOUNTER — Encounter (HOSPITAL_COMMUNITY)
Admission: RE | Disposition: A | Discharge status: Home / Self Care | Payer: Self-pay | Source: Home / Self Care | Attending: Neurosurgery

## 2021-10-25 DIAGNOSIS — Z8261 Family history of arthritis: Secondary | ICD-10-CM

## 2021-10-25 DIAGNOSIS — Z8249 Family history of ischemic heart disease and other diseases of the circulatory system: Secondary | ICD-10-CM

## 2021-10-25 DIAGNOSIS — I10 Essential (primary) hypertension: Secondary | ICD-10-CM | POA: Diagnosis not present

## 2021-10-25 DIAGNOSIS — M4802 Spinal stenosis, cervical region: Secondary | ICD-10-CM | POA: Diagnosis not present

## 2021-10-25 DIAGNOSIS — F418 Other specified anxiety disorders: Secondary | ICD-10-CM | POA: Diagnosis not present

## 2021-10-25 DIAGNOSIS — G992 Myelopathy in diseases classified elsewhere: Secondary | ICD-10-CM | POA: Diagnosis not present

## 2021-10-25 DIAGNOSIS — K219 Gastro-esophageal reflux disease without esophagitis: Secondary | ICD-10-CM | POA: Diagnosis present

## 2021-10-25 DIAGNOSIS — Z981 Arthrodesis status: Secondary | ICD-10-CM | POA: Diagnosis not present

## 2021-10-25 DIAGNOSIS — G959 Disease of spinal cord, unspecified: Principal | ICD-10-CM | POA: Diagnosis present

## 2021-10-25 DIAGNOSIS — G4700 Insomnia, unspecified: Secondary | ICD-10-CM | POA: Diagnosis present

## 2021-10-25 DIAGNOSIS — Z885 Allergy status to narcotic agent status: Secondary | ICD-10-CM | POA: Diagnosis not present

## 2021-10-25 DIAGNOSIS — Z823 Family history of stroke: Secondary | ICD-10-CM | POA: Diagnosis not present

## 2021-10-25 DIAGNOSIS — M4712 Other spondylosis with myelopathy, cervical region: Secondary | ICD-10-CM | POA: Diagnosis present

## 2021-10-25 DIAGNOSIS — Z79899 Other long term (current) drug therapy: Secondary | ICD-10-CM | POA: Diagnosis not present

## 2021-10-25 DIAGNOSIS — Z91013 Allergy to seafood: Secondary | ICD-10-CM

## 2021-10-25 DIAGNOSIS — Z833 Family history of diabetes mellitus: Secondary | ICD-10-CM

## 2021-10-25 DIAGNOSIS — F17210 Nicotine dependence, cigarettes, uncomplicated: Secondary | ICD-10-CM | POA: Diagnosis present

## 2021-10-25 DIAGNOSIS — Z888 Allergy status to other drugs, medicaments and biological substances status: Secondary | ICD-10-CM | POA: Diagnosis not present

## 2021-10-25 DIAGNOSIS — F4310 Post-traumatic stress disorder, unspecified: Secondary | ICD-10-CM | POA: Diagnosis present

## 2021-10-25 HISTORY — PX: ANTERIOR CERVICAL DECOMP/DISCECTOMY FUSION: SHX1161

## 2021-10-25 LAB — POCT PREGNANCY, URINE: Preg Test, Ur: NEGATIVE

## 2021-10-25 IMAGING — RF DG CERVICAL SPINE 1V
1 series · 2 of 2 positions shown · non-contrast
Comparison: [DATE]

CLINICAL DATA: C5-T1 ACDF

EXAM:
DG CERVICAL SPINE - 1 VIEW

[Series 1: run · 2 of 2 slices shown]
[im 1/2]
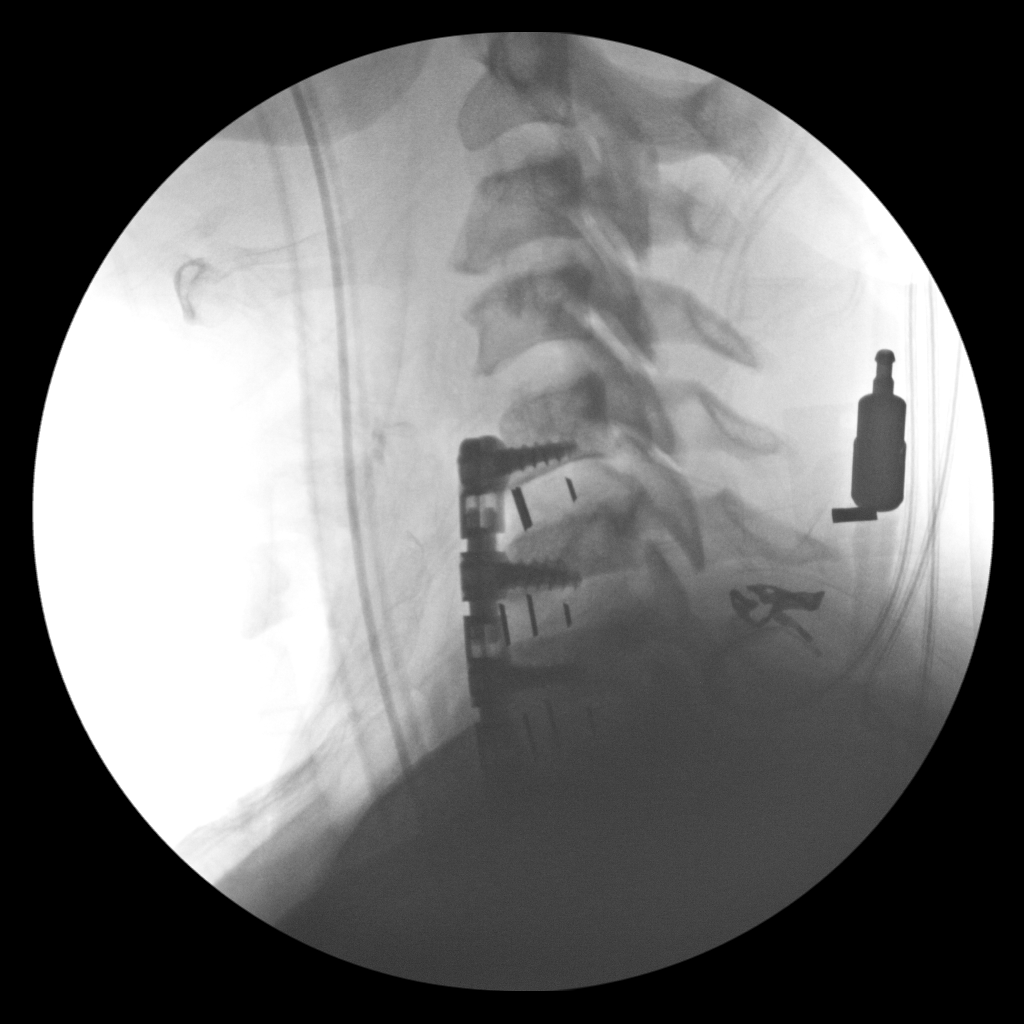
[im 2/2]
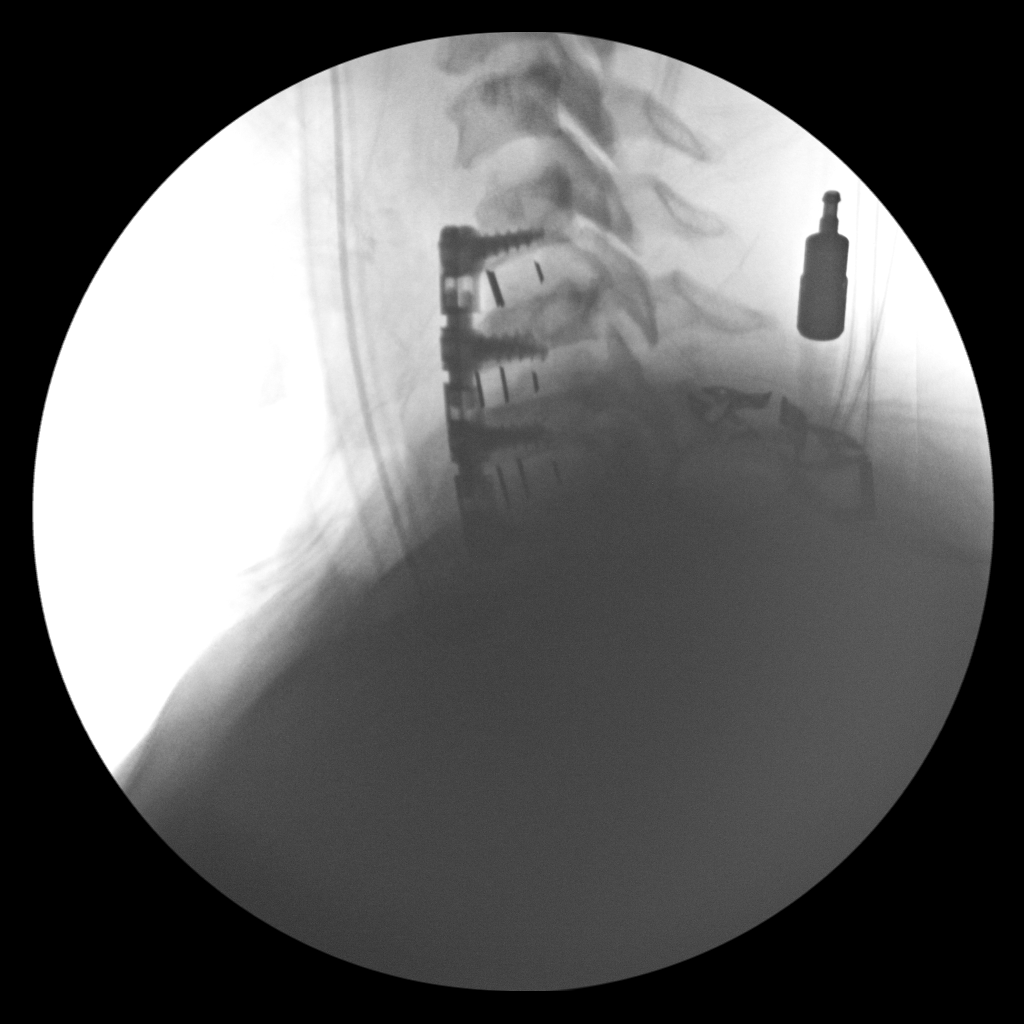

[2 of 2 positions shown; findings below may reference images not displayed]

FINDINGS: Two fluoroscopic images are obtained during the performance of the
procedure and are provided for interpretation only. Images
demonstrate ACDF C5-T1, with interbody disc spacers. The inferior
aspect of the ACDF is obscured by overlying tissues.

Fluoroscopy time: 7 seconds

0.46 mGy
IMPRESSION: Expected intraoperative appearance of C5-T1 ACDF.

## 2021-10-25 SURGERY — ANTERIOR CERVICAL DECOMPRESSION/DISCECTOMY FUSION 3 LEVELS
Anesthesia: General | Site: Spine Cervical

## 2021-10-25 MED ORDER — LIDOCAINE 2% (20 MG/ML) 5 ML SYRINGE
INTRAMUSCULAR | Status: DC | PRN
  Administered 2021-10-25: 40 mg via INTRAVENOUS

## 2021-10-25 MED ORDER — PROPOFOL 10 MG/ML IV BOLUS
INTRAVENOUS | Status: DC | PRN
  Administered 2021-10-25: 50 mg via INTRAVENOUS
  Administered 2021-10-25: 150 mg via INTRAVENOUS

## 2021-10-25 MED ORDER — SODIUM CHLORIDE 0.9% FLUSH: 3.0000 mL | INTRAVENOUS | Status: DC | PRN

## 2021-10-25 MED ORDER — ONDANSETRON HCL 4 MG/2ML IJ SOLN
INTRAMUSCULAR | Status: DC | PRN
Start: 2021-10-25 — End: 2021-10-25
  Administered 2021-10-25: 4 mg via INTRAVENOUS

## 2021-10-25 MED ORDER — HYDROCODONE-ACETAMINOPHEN 5-325 MG PO TABS
1.0000 | ORAL_TABLET | ORAL | Status: DC | PRN
  Administered 2021-10-26: 1 via ORAL
  Filled 2021-10-25: qty 1

## 2021-10-25 MED ORDER — ACETAMINOPHEN 500 MG PO TABS
1000.0000 mg | ORAL_TABLET | Freq: Once | ORAL | Status: AC
  Administered 2021-10-25: 1000 mg via ORAL
  Filled 2021-10-25: qty 2

## 2021-10-25 MED ORDER — HYDROXYZINE HCL 10 MG PO TABS
10.0000 mg | ORAL_TABLET | Freq: Three times a day (TID) | ORAL | Status: DC | PRN
  Filled 2021-10-25: qty 1

## 2021-10-25 MED ORDER — MUPIROCIN 2 % EX OINT
TOPICAL_OINTMENT | CUTANEOUS | Status: AC
  Administered 2021-10-25: 1 via TOPICAL
  Filled 2021-10-25: qty 22

## 2021-10-25 MED ORDER — FENTANYL CITRATE (PF) 250 MCG/5ML IJ SOLN
INTRAMUSCULAR | Status: AC
  Filled 2021-10-25: qty 5

## 2021-10-25 MED ORDER — THROMBIN 5000 UNITS EX SOLR
OROMUCOSAL | Status: DC | PRN
  Administered 2021-10-25: 5 mL via TOPICAL

## 2021-10-25 MED ORDER — FENTANYL CITRATE (PF) 250 MCG/5ML IJ SOLN
INTRAMUSCULAR | Status: AC
Start: 2021-10-25 — End: ?
  Filled 2021-10-25: qty 5

## 2021-10-25 MED ORDER — AMLODIPINE BESYLATE 5 MG PO TABS: 10.0000 mg | ORAL_TABLET | Freq: Every day | ORAL | Status: DC

## 2021-10-25 MED ORDER — ONDANSETRON HCL 4 MG PO TABS: 4.0000 mg | ORAL_TABLET | Freq: Four times a day (QID) | ORAL | Status: DC | PRN

## 2021-10-25 MED ORDER — CHLORHEXIDINE GLUCONATE CLOTH 2 % EX PADS: 6.0000 | MEDICATED_PAD | Freq: Once | CUTANEOUS | Status: DC

## 2021-10-25 MED ORDER — MIDAZOLAM HCL 2 MG/2ML IJ SOLN: 0.5000 mg | Freq: Once | INTRAMUSCULAR | Status: DC | PRN

## 2021-10-25 MED ORDER — SODIUM CHLORIDE 0.9 % IV SOLN: 250.0000 mL | INTRAVENOUS | Status: DC

## 2021-10-25 MED ORDER — THROMBIN 5000 UNITS EX SOLR
CUTANEOUS | Status: AC
  Filled 2021-10-25: qty 5000

## 2021-10-25 MED ORDER — ONDANSETRON HCL 4 MG/2ML IJ SOLN: 4.0000 mg | Freq: Four times a day (QID) | INTRAMUSCULAR | Status: DC | PRN

## 2021-10-25 MED ORDER — LACTATED RINGERS IV SOLN: INTRAVENOUS | Status: DC

## 2021-10-25 MED ORDER — OXYCODONE HCL 5 MG PO TABS: 5.0000 mg | ORAL_TABLET | Freq: Once | ORAL | Status: DC | PRN

## 2021-10-25 MED ORDER — CEFAZOLIN SODIUM-DEXTROSE 1-4 GM/50ML-% IV SOLN
1.0000 g | Freq: Three times a day (TID) | INTRAVENOUS | Status: AC
  Administered 2021-10-25 – 2021-10-26 (×2): 1 g via INTRAVENOUS
  Filled 2021-10-25 (×2): qty 50

## 2021-10-25 MED ORDER — PHENOL 1.4 % MT LIQD: 1.0000 | OROMUCOSAL | Status: DC | PRN

## 2021-10-25 MED ORDER — SODIUM CHLORIDE 0.9% FLUSH
3.0000 mL | Freq: Two times a day (BID) | INTRAVENOUS | Status: DC
  Administered 2021-10-25: 3 mL via INTRAVENOUS

## 2021-10-25 MED ORDER — DEXAMETHASONE SODIUM PHOSPHATE 10 MG/ML IJ SOLN
INTRAMUSCULAR | Status: DC | PRN
  Administered 2021-10-25: 10 mg via INTRAVENOUS

## 2021-10-25 MED ORDER — MEPERIDINE HCL 25 MG/ML IJ SOLN: 6.2500 mg | INTRAMUSCULAR | Status: DC | PRN

## 2021-10-25 MED ORDER — HYDROCODONE-ACETAMINOPHEN 10-325 MG PO TABS
2.0000 | ORAL_TABLET | ORAL | Status: DC | PRN
  Administered 2021-10-25: 1 via ORAL
  Administered 2021-10-26: 2 via ORAL
  Filled 2021-10-25 (×2): qty 2

## 2021-10-25 MED ORDER — FERROUS SULFATE 325 (65 FE) MG PO TABS
325.0000 mg | ORAL_TABLET | Freq: Every day | ORAL | Status: DC
  Administered 2021-10-26: 325 mg via ORAL
  Filled 2021-10-25: qty 1

## 2021-10-25 MED ORDER — CHLORHEXIDINE GLUCONATE 0.12 % MT SOLN
15.0000 mL | Freq: Once | OROMUCOSAL | Status: AC
  Administered 2021-10-25: 15 mL via OROMUCOSAL
  Filled 2021-10-25: qty 15

## 2021-10-25 MED ORDER — ORAL CARE MOUTH RINSE: 15.0000 mL | Freq: Once | OROMUCOSAL | Status: AC

## 2021-10-25 MED ORDER — MIDAZOLAM HCL 2 MG/2ML IJ SOLN
INTRAMUSCULAR | Status: AC
  Filled 2021-10-25: qty 2

## 2021-10-25 MED ORDER — FENTANYL CITRATE (PF) 250 MCG/5ML IJ SOLN
INTRAMUSCULAR | Status: DC | PRN
  Administered 2021-10-25 (×2): 50 ug via INTRAVENOUS
  Administered 2021-10-25: 150 ug via INTRAVENOUS
  Administered 2021-10-25 (×3): 50 ug via INTRAVENOUS

## 2021-10-25 MED ORDER — MENTHOL 3 MG MT LOZG: 1.0000 | LOZENGE | OROMUCOSAL | Status: DC | PRN

## 2021-10-25 MED ORDER — PHENYLEPHRINE 80 MCG/ML (10ML) SYRINGE FOR IV PUSH (FOR BLOOD PRESSURE SUPPORT)
PREFILLED_SYRINGE | INTRAVENOUS | Status: DC | PRN
  Administered 2021-10-25: 80 ug via INTRAVENOUS

## 2021-10-25 MED ORDER — 0.9 % SODIUM CHLORIDE (POUR BTL) OPTIME
TOPICAL | Status: DC | PRN
  Administered 2021-10-25: 1000 mL

## 2021-10-25 MED ORDER — PHENYLEPHRINE HCL-NACL 20-0.9 MG/250ML-% IV SOLN
INTRAVENOUS | Status: DC | PRN
  Administered 2021-10-25: 25 ug/min via INTRAVENOUS

## 2021-10-25 MED ORDER — MUPIROCIN 2 % EX OINT: 1.0000 "application " | TOPICAL_OINTMENT | Freq: Once | CUTANEOUS | Status: AC

## 2021-10-25 MED ORDER — ACETAMINOPHEN 650 MG RE SUPP
650.0000 mg | RECTAL | Status: DC | PRN
Start: 2021-10-25 — End: 2021-10-26

## 2021-10-25 MED ORDER — LACTATED RINGERS IV SOLN: INTRAVENOUS | Status: DC | PRN

## 2021-10-25 MED ORDER — PANTOPRAZOLE SODIUM 40 MG PO TBEC: 40.0000 mg | DELAYED_RELEASE_TABLET | Freq: Every day | ORAL | Status: DC

## 2021-10-25 MED ORDER — ACETAMINOPHEN 325 MG PO TABS: 650.0000 mg | ORAL_TABLET | ORAL | Status: DC | PRN

## 2021-10-25 MED ORDER — CEFAZOLIN SODIUM-DEXTROSE 2-4 GM/100ML-% IV SOLN
2.0000 g | INTRAVENOUS | Status: AC
  Administered 2021-10-25: 2 g via INTRAVENOUS
  Filled 2021-10-25: qty 100

## 2021-10-25 MED ORDER — HYDROMORPHONE HCL 1 MG/ML IJ SOLN: 0.2500 mg | INTRAMUSCULAR | Status: DC | PRN

## 2021-10-25 MED ORDER — OXYCODONE HCL 5 MG/5ML PO SOLN: 5.0000 mg | Freq: Once | ORAL | Status: DC | PRN

## 2021-10-25 MED ORDER — ROCURONIUM BROMIDE 10 MG/ML (PF) SYRINGE
PREFILLED_SYRINGE | INTRAVENOUS | Status: DC | PRN
Start: 2021-10-25 — End: 2021-10-25
  Administered 2021-10-25: 70 mg via INTRAVENOUS
  Administered 2021-10-25: 30 mg via INTRAVENOUS

## 2021-10-25 MED ORDER — SUGAMMADEX SODIUM 200 MG/2ML IV SOLN
INTRAVENOUS | Status: DC | PRN
  Administered 2021-10-25: 200 mg via INTRAVENOUS

## 2021-10-25 MED ORDER — METOPROLOL SUCCINATE ER 25 MG PO TB24: 25.0000 mg | ORAL_TABLET | Freq: Every day | ORAL | Status: DC

## 2021-10-25 MED ORDER — THROMBIN 20000 UNITS EX SOLR
CUTANEOUS | Status: AC
  Filled 2021-10-25: qty 20000

## 2021-10-25 MED ORDER — GELATIN ABSORBABLE 100 EX MISC
CUTANEOUS | Status: DC | PRN
  Administered 2021-10-25: 20 mL via TOPICAL

## 2021-10-25 MED ORDER — ESTRADIOL 0.025 MG/24HR TD PTWK: 0.0250 mg | MEDICATED_PATCH | TRANSDERMAL | Status: DC

## 2021-10-25 MED ORDER — MIDAZOLAM HCL 2 MG/2ML IJ SOLN
INTRAMUSCULAR | Status: DC | PRN
  Administered 2021-10-25: 2 mg via INTRAVENOUS

## 2021-10-25 MED ORDER — ALBUTEROL SULFATE (2.5 MG/3ML) 0.083% IN NEBU: 2.5000 mg | INHALATION_SOLUTION | Freq: Four times a day (QID) | RESPIRATORY_TRACT | Status: DC | PRN

## 2021-10-25 MED ORDER — METHOCARBAMOL 750 MG PO TABS
750.0000 mg | ORAL_TABLET | Freq: Four times a day (QID) | ORAL | Status: DC | PRN
  Administered 2021-10-26: 750 mg via ORAL
  Filled 2021-10-25: qty 1

## 2021-10-25 SURGICAL SUPPLY — 60 items
APL SKNCLS STERI-STRIP NONHPOA (GAUZE/BANDAGES/DRESSINGS) ×1
BAG COUNTER SPONGE SURGICOUNT (BAG) ×3 IMPLANT
BAG DECANTER FOR FLEXI CONT (MISCELLANEOUS) ×3 IMPLANT
BAG SPNG CNTER NS LX DISP (BAG) ×1
BAND INSRT 18 STRL LF DISP RB (MISCELLANEOUS) ×2
BAND RUBBER #18 3X1/16 STRL (MISCELLANEOUS) ×6 IMPLANT
BENZOIN TINCTURE PRP APPL 2/3 (GAUZE/BANDAGES/DRESSINGS) ×3 IMPLANT
BUR MATCHSTICK NEURO 3.0 LAGG (BURR) ×3 IMPLANT
CAGE PEEK 6X14X11 (Cage) ×4 IMPLANT
CAGE PEEK 7X14X11 (Cage) ×2 IMPLANT
CAGE SPNL 11X14X6XRADOPQ (Cage) IMPLANT
CANISTER SUCT 3000ML PPV (MISCELLANEOUS) ×3 IMPLANT
CARTRIDGE OIL MAESTRO DRILL (MISCELLANEOUS) ×2 IMPLANT
CLSR STERI-STRIP ANTIMIC 1/2X4 (GAUZE/BANDAGES/DRESSINGS) ×1 IMPLANT
DIFFUSER DRILL AIR PNEUMATIC (MISCELLANEOUS) ×3 IMPLANT
DRAPE C-ARM 42X72 X-RAY (DRAPES) ×6 IMPLANT
DRAPE LAPAROTOMY 100X72 PEDS (DRAPES) ×3 IMPLANT
DRAPE MICROSCOPE LEICA (MISCELLANEOUS) ×3 IMPLANT
DURAPREP 6ML APPLICATOR 50/CS (WOUND CARE) ×3 IMPLANT
ELECT COATED BLADE 2.86 ST (ELECTRODE) ×3 IMPLANT
ELECT REM PT RETURN 9FT ADLT (ELECTROSURGICAL) ×2
ELECTRODE REM PT RTRN 9FT ADLT (ELECTROSURGICAL) ×2 IMPLANT
GAUZE 4X4 16PLY ~~LOC~~+RFID DBL (SPONGE) IMPLANT
GAUZE SPONGE 4X4 12PLY STRL (GAUZE/BANDAGES/DRESSINGS) ×3 IMPLANT
GLOVE BIO SURGEON STRL SZ7 (GLOVE) ×1 IMPLANT
GLOVE ECLIPSE 9.0 STRL (GLOVE) ×3 IMPLANT
GLOVE EXAM NITRILE XL STR (GLOVE) IMPLANT
GLOVE SURG UNDER POLY LF SZ7 (GLOVE) ×2 IMPLANT
GOWN STRL REUS W/ TWL LRG LVL3 (GOWN DISPOSABLE) IMPLANT
GOWN STRL REUS W/ TWL XL LVL3 (GOWN DISPOSABLE) IMPLANT
GOWN STRL REUS W/TWL 2XL LVL3 (GOWN DISPOSABLE) IMPLANT
GOWN STRL REUS W/TWL LRG LVL3 (GOWN DISPOSABLE) ×4
GOWN STRL REUS W/TWL XL LVL3 (GOWN DISPOSABLE) ×4
HALTER HD/CHIN CERV TRACTION D (MISCELLANEOUS) ×3 IMPLANT
HEMOSTAT POWDER KIT SURGIFOAM (HEMOSTASIS) ×3 IMPLANT
KIT BASIN OR (CUSTOM PROCEDURE TRAY) ×3 IMPLANT
KIT TURNOVER KIT B (KITS) ×3 IMPLANT
NDL SPNL 20GX3.5 QUINCKE YW (NEEDLE) ×2 IMPLANT
NEEDLE SPNL 20GX3.5 QUINCKE YW (NEEDLE) ×2 IMPLANT
NS IRRIG 1000ML POUR BTL (IV SOLUTION) ×3 IMPLANT
OIL CARTRIDGE MAESTRO DRILL (MISCELLANEOUS) ×2
PACK LAMINECTOMY NEURO (CUSTOM PROCEDURE TRAY) ×3 IMPLANT
PAD ARMBOARD 7.5X6 YLW CONV (MISCELLANEOUS) ×9 IMPLANT
PLATE ATLANTIS VISION 55 (Plate) ×1 IMPLANT
SCREW ST FIX 4 ATL 3120213 (Screw) ×8 IMPLANT
SPACER SPNL 11X14X6XPEEK CVD (Cage) IMPLANT
SPACER SPNL 11X14X7XPEEK CVD (Cage) IMPLANT
SPCR SPNL 11X14X6XPEEK CVD (Cage) ×1 IMPLANT
SPCR SPNL 11X14X7XPEEK CVD (Cage) ×1 IMPLANT
SPONGE INTESTINAL PEANUT (DISPOSABLE) ×3 IMPLANT
SPONGE SURGIFOAM ABS GEL 100 (HEMOSTASIS) ×3 IMPLANT
STRIP CLOSURE SKIN 1/2X4 (GAUZE/BANDAGES/DRESSINGS) ×3 IMPLANT
SUT VIC AB 3-0 SH 8-18 (SUTURE) ×3 IMPLANT
SUT VIC AB 4-0 RB1 18 (SUTURE) ×3 IMPLANT
TAPE CLOTH 4X10 WHT NS (GAUZE/BANDAGES/DRESSINGS) ×3 IMPLANT
TAPE CLOTH SURG 4X10 WHT LF (GAUZE/BANDAGES/DRESSINGS) ×1 IMPLANT
TOWEL GREEN STERILE (TOWEL DISPOSABLE) ×3 IMPLANT
TOWEL GREEN STERILE FF (TOWEL DISPOSABLE) ×3 IMPLANT
TRAP SPECIMEN MUCUS 40CC (MISCELLANEOUS) ×3 IMPLANT
WATER STERILE IRR 1000ML POUR (IV SOLUTION) ×3 IMPLANT

## 2021-10-25 NOTE — Anesthesia Preprocedure Evaluation (Addendum)
Anesthesia Evaluation  ?Patient identified by MRN, date of birth, ID band ?Patient awake ? ? ? ?Reviewed: ?NPO status , Patient's Chart, lab work & pertinent test results ? ?History of Anesthesia Complications ?(+) POST - OP SPINAL HEADACHE and history of anesthetic complications ? ?Airway ?Mallampati: II ? ?TM Distance: >3 FB ?Neck ROM: Full ? ? ? Dental ? ?(+) Dental Advisory Given ?  ?Pulmonary ?COPD,  COPD inhaler, Current Smoker and Patient abstained from smoking.,  ?  ?breath sounds clear to auscultation ? ? ? ? ? ? Cardiovascular ?hypertension, Pt. on medications and Pt. on home beta blockers ?(-) angina ?Rhythm:Regular Rate:Normal ? ? ?  ?Neuro/Psych ? Headaches, Anxiety Depression   ? GI/Hepatic ?Neg liver ROS, GERD  Medicated and Controlled,  ?Endo/Other  ?negative endocrine ROS ? Renal/GU ?negative Renal ROS  ? ?  ?Musculoskeletal ? ?(+) Arthritis ,  ? Abdominal ?  ?Peds ? Hematology ? ?(+) Blood dyscrasia (Hb 9.8), anemia , Hb 9.8   ?Anesthesia Other Findings ? ? Reproductive/Obstetrics ? ?  ? ? ? ? ? ? ? ? ? ? ? ? ? ?  ?  ? ? ? ? ? ? ? ?Anesthesia Physical ?Anesthesia Plan ? ?ASA: 3 ? ?Anesthesia Plan: General  ? ?Post-op Pain Management: Tylenol PO (pre-op)*  ? ?Induction: Intravenous ? ?PONV Risk Score and Plan: 2 and Ondansetron and Dexamethasone ? ?Airway Management Planned: Oral ETT and Video Laryngoscope Planned ? ?Additional Equipment: None ? ?Intra-op Plan:  ? ?Post-operative Plan: Extubation in OR ? ?Informed Consent: I have reviewed the patients History and Physical, chart, labs and discussed the procedure including the risks, benefits and alternatives for the proposed anesthesia with the patient or authorized representative who has indicated his/her understanding and acceptance.  ? ? ? ?Dental advisory given ? ?Plan Discussed with: CRNA and Surgeon ? ?Anesthesia Plan Comments:   ? ? ? ? ? ?Anesthesia Quick Evaluation ? ?

## 2021-10-25 NOTE — Brief Op Note (Signed)
10/25/2021 ? ?2:20 PM ? ?PATIENT:  Kayla Dunn  41 y.o. female ? ?PRE-OPERATIVE DIAGNOSIS:  Stenosis - myelopathy ? ?POST-OPERATIVE DIAGNOSIS:  Stenosis - myelopathy ? ?PROCEDURE:  Procedure(s): ?Anterior Cervical Decompression Fusion - Cervical five-Cervical six - Cervical six-Cervical seven - Cervical seven-Thoracic one (N/A) ? ?SURGEON:  Surgeon(s) and Role: ?   Julio Sicks, MD - Primary ? ?PHYSICIAN ASSISTANT:  ? ?ASSISTANTS: Bergman,NP  ? ?ANESTHESIA:   general ? ?EBL:  75 mL  ? ?BLOOD ADMINISTERED:none ? ?DRAINS: none  ? ?LOCAL MEDICATIONS USED:  NONE ? ?SPECIMEN:  No Specimen ? ?DISPOSITION OF SPECIMEN:  N/A ? ?COUNTS:  YES ? ?TOURNIQUET:  * No tourniquets in log * ? ?DICTATION: .Dragon Dictation ? ?PLAN OF CARE: Admit to inpatient  ? ?PATIENT DISPOSITION:  PACU - hemodynamically stable. ?  ?Delay start of Pharmacological VTE agent (>24hrs) due to surgical blood loss or risk of bleeding: yes ? ?

## 2021-10-25 NOTE — H&P (Signed)
?Kayla Dunn is an 41 y.o. female.   ?Chief Complaint: Numbness and weakness ?HPI: 41 year old female with chronic and progressively worsening bilateral upper and lower extremity numbness paresthesias and some spastic weakness.  Work-up demonstrates evidence of marked cervical spondylosis with moderate spinal stenosis at C5-6, C6-7 and C7-T1.  The patient has evidence of chronic appearing cord signal abnormality behind the levels of stenosis.  The patient presents now for 3 level anterior cervical discectomy and fusion in hopes of improving her situation. ? ?Past Medical History:  ?Diagnosis Date  ? Anemia   ? Arthritis   ? Bronchitis   ? Depression   ? no meds, fine right now  ? GERD (gastroesophageal reflux disease)   ? Heart murmur   ? Hypertension   ? "havent been taking my medicine in awhile"  ? Insomnia   ? Insomnia   ? Migraines   ? Pancreatitis   ? Perimenopausal   ? PID (acute pelvic inflammatory disease)   ? Pregnancy induced hypertension   ? Preterm labor   ? PTSD (post-traumatic stress disorder)   ? Seasonal allergies   ? ? ?Past Surgical History:  ?Procedure Laterality Date  ? CESAREAN SECTION    ? CHOLECYSTECTOMY N/A 08/27/2013  ? Procedure: LAPAROSCOPIC CHOLECYSTECTOMY WITH INTRAOPERATIVE CHOLANGIOGRAM;  Surgeon: Pedro Earls, MD;  Location: [facility];  Service: General;  Laterality: N/A;  ? DILATION AND CURETTAGE OF UTERUS    ? OVARY SURGERY    ? TUBAL LIGATION    ? ? ?Family History  ?Problem Relation Age of Onset  ? Hypertension Father   ? Liver disease Father   ? Stroke Father   ? Cancer Mother   ?     colon  ? Arthritis Mother   ? Diabetes Paternal Grandmother   ? Hypertension Paternal Grandmother   ? Heart disease Paternal Grandmother   ? ?Social History:  reports that she has been smoking cigarettes. She has a 3.00 pack-year smoking history. She has never used smokeless tobacco. She reports current drug use. Drug: Marijuana. She reports that she does not drink alcohol. ? ?Allergies:   ?Allergies  ?Allergen Reactions  ? Aspirin Anaphylaxis  ? Hydromorphone Hcl Anaphylaxis  ? Aloe Vera Hives and Itching  ? Dust Mite Extract   ?  Seasonal allergies   ? Morphine Hives  ? Shrimp [Shellfish Allergy] Swelling  ? ? ?Medications Prior to Admission  ?Medication Sig Dispense Refill  ? amLODipine (NORVASC) 10 MG tablet TAKE 1 TABLET BY MOUTH EVERY DAY 30 tablet 0  ? estradiol (CLIMARA - DOSED IN MG/24 HR) 0.025 mg/24hr patch PLACE 1 PATCH (0.025 MG TOTAL) ONTO THE SKIN ONCE A WEEK. (Patient taking differently: Place 0.025 mg onto the skin once a week. Monday) 12 patch 5  ? ferrous sulfate 325 (65 FE) MG tablet Take 1 tablet (325 mg total) by mouth daily. 30 tablet 3  ? hydrOXYzine (ATARAX) 10 MG tablet Take 1 tablet (10 mg total) by mouth 3 (three) times daily as needed. (Patient taking differently: Take 10 mg by mouth 3 (three) times daily as needed for itching (allergies).) 30 tablet 0  ? methocarbamol (ROBAXIN-750) 750 MG tablet Take 1 tablet (750 mg total) by mouth every 6 (six) hours as needed for muscle spasms. 30 tablet 0  ? metoprolol succinate (TOPROL-XL) 25 MG 24 hr tablet Take 1 tablet (25 mg total) by mouth daily. 90 tablet 3  ? nystatin ointment (MYCOSTATIN) Apply 1 application. topically daily as needed (rash).    ?  pantoprazole (PROTONIX) 40 MG tablet Take 1 tablet (40 mg total) by mouth daily. 30 tablet 3  ? albuterol (PROVENTIL) (2.5 MG/3ML) 0.083% nebulizer solution Take 2.5 mg by nebulization every 6 (six) hours as needed for wheezing or shortness of breath.    ? ? ?Results for orders placed or performed during the hospital encounter of 10/25/21 (from the past 48 hour(s))  ?Pregnancy, urine POC     Status: None  ? Collection Time: 10/25/21  9:44 AM  ?Result Value Ref Range  ? Preg Test, Ur NEGATIVE NEGATIVE  ?  Comment:        ?THE SENSITIVITY OF THIS ?METHODOLOGY IS >24 mIU/mL ?  ? ?No results found. ? ?Pertinent items noted in HPI and remainder of comprehensive ROS otherwise  negative. ? ?Blood pressure 132/83, pulse 68, temperature 98.2 ?F (36.8 ?C), resp. rate 17, height 5\' 1"  (1.549 m), weight 65.8 kg, last menstrual period 10/06/2021, SpO2 99 %. ? ?Patient is awake and alert.  She is oriented and appropriate.  Speech is fluent.  Judgment insight are intact.  Cranial nerve function normal bilateral.  Motor examination reveals mild weakness of her grips and intrinsics function bilaterally.  Lower extremities with increased tone and some spasticity.  Sensory examination with patchy distal sensory loss in both upper and lower extremities.  Gait is ataxic and spastic.  Examination head ears eyes nose and throat is unremarked.  Chest and abdomen are benign.  Extremities are free from injury or deformity.  Reflexes are increased.  Hoffmann's are positive both hands.  Toes are upgoing to plantar stimulation. ?Assessment/Plan ?Significant cervical spondylosis and stenosis with chronic and worsening cervical myelopathy.  Plan C5-6, C6-7, C7-T1 anterior cervical discectomy and fusion with interbody cages, local harvested autograft, and anterior plate is rotation in hopes of improving her symptoms.  Risks and benefits been explained.  Patient wishes to proceed. ? ?Kayla Dunn ?10/25/2021, 11:25 AM ? ? ? ?

## 2021-10-25 NOTE — Op Note (Signed)
Date of procedure: 10/25/2021 ? ?Date of dictation: Same ? ?Service: Neurosurgery ? ?Preoperative diagnosis: Cervical stenosis with myelopathy ? ?Postoperative diagnosis: Same ? ?Procedure Name: C5-6, C6-7, C7-T1 anterior cervical discectomy with interbody fusion utilizing interbody cages, locally harvested autograft, and anterior plate instrumentation ? ?Surgeon:Willye Javier A.Fallou Hulbert, M.D. ? ?Asst. Surgeon: Doran Durand, NP ? ?Anesthesia: General ? 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  Patient presents with worsening numbness paresthesias and some weakness.  Work-up demonstrates evidence of marked cervical spondylosis with kyphotic angulation at C5-6, C6-7 and C7 and T1.  The spinal cord has significant signal abnormality concerning for prior injury.  Patient presents now for 3 level anterior cervical decompression and fusion in hopes of improving her symptoms. ? ?Operative note: After induction anesthesia, patient positioned supine with neck slightly extended and held placed halter traction.  Patient's anterior cervical region prepped and draped sterilely.  Incision made on the left side.  Dissection performed on the left.  Retractor placed.  Fluoroscopy used.  Levels confirmed.  Disc spaces at C5-6, C6-7 and C7 and T1 were incised.  Discectomy was then performed using various instruments down to level the posterior annulus.  Microscope was then brought into the field used throughout the remainder of the discectomies.  Remaining aspects of annulus and osteophytes removed down to level the posterior longitudinal ligament.  Posterior logical was then elevated and resected.  Underlying thecal sac was identified.  Wide central decompression then performed by undercutting the bodies of C5 and C6.  Decompression then proceeded to each neural foramina.  Wide anterior foraminotomies performed along the course exiting C6 nerve roots bilaterally.   At this point a very thorough decompression of been achieved.  There was no evidence of injury to thecal sac or nerve roots.  Procedure then repeated at C6-7 and C7-T1 again without complications.  Wounds were then irrigated.  Gelfoam was placed topically for hemostasis then removed.  [employer] anatomic peek cages were then packed with locally harvested autograft.  Each cage was then impacted into place and recessed slightly from the anterior cortical margin.  Atlantis translational plate was then placed over the C5, C6, C7 and T1 levels.  This then attached under fluoroscopic guidance using 13 mm fixed angle screws to each at all 4 levels.  All screws given final tightening found to be solidly within the bone.  Locking screws engaged all levels.  Final images reveal good position of the cages and the hardware at the proper operative level with normal alignment of spine.  Wound was then irrigated.  Hemostasis was obtained with the bipolar cautery.  Wounds and closed in layers with Vicryl sutures.  Steri-Strips and sterile dressing were applied.  No apparent complications.  Patient tolerated the procedure well and she returns to the recovery room postop. ? ?

## 2021-10-25 NOTE — Anesthesia Procedure Notes (Signed)
Procedure Name: Intubation ?Date/Time: 10/25/2021 12:26 PM ?Performed by: Nils Pyle, CRNA ?Pre-anesthesia Checklist: Patient identified, Emergency Drugs available, Suction available and Patient being monitored ?Patient Re-evaluated:Patient Re-evaluated prior to induction ?Oxygen Delivery Method: Circle System Utilized ?Preoxygenation: Pre-oxygenation with 100% oxygen ?Induction Type: IV induction ?Ventilation: Mask ventilation without difficulty ?Laryngoscope Size: Glidescope and 3 ?Grade View: Grade I ?Tube type: Oral ?Tube size: 7.0 mm ?Number of attempts: 1 ?Airway Equipment and Method: Stylet and Oral airway ?Placement Confirmation: ETT inserted through vocal cords under direct vision, positive ETCO2 and breath sounds checked- equal and bilateral ?Secured at: 22 cm ?Tube secured with: Tape ?Dental Injury: Teeth and Oropharynx as per pre-operative assessment  ? ? ? ? ?

## 2021-10-25 NOTE — Anesthesia Postprocedure Evaluation (Signed)
Anesthesia Post Note ? ?Patient: MATTISEN POHLMANN ? ?Procedure(s) Performed: Anterior Cervical Decompression Fusion - Cervical five-Cervical six - Cervical six-Cervical seven - Cervical seven-Thoracic one (Spine Cervical) ? ?  ? ?Patient location during evaluation: PACU ?Anesthesia Type: General ?Level of consciousness: awake and alert, patient cooperative and oriented ?Pain management: pain level controlled ?Vital Signs Assessment: post-procedure vital signs reviewed and stable ?Respiratory status: spontaneous breathing, nonlabored ventilation and respiratory function stable ?Cardiovascular status: blood pressure returned to baseline and stable ?Postop Assessment: no apparent nausea or vomiting, adequate PO intake and able to ambulate ?Anesthetic complications: no ? ? ?No notable events documented. ? ?Last Vitals:  ?Vitals:  ? 10/25/21 1455 10/25/21 1505  ?BP: (!) 152/91   ?Pulse: 66 83  ?Resp: 18 20  ?Temp:  36.8 ?C  ?SpO2: 100% 100%  ?  ?Last Pain:  ?Vitals:  ? 10/25/21 1455  ?PainSc: 0-No pain  ? ? ?  ?  ?  ?  ?  ?  ? ?Yena Tisby,E. Kenya Kook ? ? ? ? ?

## 2021-10-25 NOTE — Progress Notes (Signed)
Orthopedic Tech Progress Note ?Patient Details:  ?Kayla Dunn ?Jul 25, 1980 ?[id number] ? ?PACU RN stated " patient has soft collar' ? ? Patient ID: Kayla Dunn, female   DOB: 09/17/1980, 41 y.o.   MRN: [id number] ? ?[name] ?10/25/2021, 3:36 PM ? ?

## 2021-10-25 NOTE — Transfer of Care (Signed)
Immediate Anesthesia Transfer of Care Note ? ?Patient: Kayla Dunn ? ?Procedure(s) Performed: Anterior Cervical Decompression Fusion - Cervical five-Cervical six - Cervical six-Cervical seven - Cervical seven-Thoracic one (Spine Cervical) ? ?Patient Location: PACU ? ?Anesthesia Type:General ? ?Level of Consciousness: awake, alert  and oriented ? ?Airway & Oxygen Therapy: Patient Spontanous Breathing ? ?Post-op Assessment: Report given to RN, Post -op Vital signs reviewed and stable and Patient moving all extremities X 4 ? ?Post vital signs: Reviewed and stable ? ?Last Vitals:  ?Vitals Value Taken Time  ?BP 155/85 10/25/21 1425  ?Temp    ?Pulse 80 10/25/21 1426  ?Resp 20 10/25/21 1426  ?SpO2 93 % 10/25/21 1426  ?Vitals shown include unvalidated device data. ? ?Last Pain:  ?Vitals:  ? 10/25/21 0920  ?PainSc: 0-No pain  ?   ? ?  ? ?Complications: No notable events documented. ?

## 2021-10-26 ENCOUNTER — Encounter (HOSPITAL_COMMUNITY): Payer: Self-pay | Admitting: Neurosurgery

## 2021-10-26 MED ORDER — HYDROCODONE-ACETAMINOPHEN 5-325 MG PO TABS: 1.0000 | ORAL_TABLET | ORAL | 0 refills | Status: DC | PRN

## 2021-10-26 NOTE — Discharge Instructions (Signed)

## 2021-10-26 NOTE — Discharge Summary (Signed)
Physician Discharge Summary  ?Patient ID: ?Kayla Dunn ?MRN: [id number] ?DOB/AGE: 1980-09-25 41 y.o. ? ?Admit date: 10/25/2021 ?Discharge date: 10/26/2021 ? ?Admission Diagnoses: ? ?Discharge Diagnoses:  ?Principal Problem: ?  Cervical myelopathy (HCC) ? ? ?Discharged Condition: good ? ?Hospital Course: Patient admitted to the hospital where she underwent 3 level anterior cervical decompression and fusion surgery without complications.  Postoperative doing very well.  Standing ambulating voiding without difficulty.  Strength and sensation improved.  Wound healing well.  Ready for discharge home. ? ?Consults:  ? ?Significant Diagnostic Studies:  ? ?Treatments:  ? ?Discharge Exam: ?Blood pressure (!) 155/95, pulse 67, temperature 99 ?F (37.2 ?C), temperature source Oral, resp. rate 16, height 5\' 1"  (1.549 m), weight 65.8 kg, last menstrual period 10/06/2021, SpO2 100 %. ?Awake and alert.  Oriented and appropriate.  Motor and sensory function intact.  Wound clean and dry.  Chest and abdomen benign. ? ?Disposition: Discharge disposition: 01-Home or Self Care ? ? ? ? ? ? ? ?Allergies as of 10/26/2021   ? ?   Reactions  ? Aspirin Anaphylaxis  ? Hydromorphone Hcl Anaphylaxis  ? Aloe Vera Hives, Itching  ? Dust Mite Extract   ? Seasonal allergies   ? Morphine Hives  ? Shrimp [shellfish Allergy] Swelling  ? ?  ? ?  ?Medication List  ?  ? ?TAKE these medications   ? ?albuterol (2.5 MG/3ML) 0.083% nebulizer solution ?Commonly known as: PROVENTIL ?Take 2.5 mg by nebulization every 6 (six) hours as needed for wheezing or shortness of breath. ?  ?amLODipine 10 MG tablet ?Commonly known as: NORVASC ?TAKE 1 TABLET BY MOUTH EVERY DAY ?  ?estradiol 0.025 mg/24hr patch ?Commonly known as: CLIMARA - Dosed in mg/24 hr ?PLACE 1 PATCH (0.025 MG TOTAL) ONTO THE SKIN ONCE A WEEK. ?What changed: additional instructions ?  ?ferrous sulfate 325 (65 FE) MG tablet ?Take 1 tablet (325 mg total) by mouth daily. ?  ?HYDROcodone-acetaminophen 5-325 MG  tablet ?Commonly known as: NORCO/VICODIN ?Take 1 tablet by mouth every 4 (four) hours as needed for moderate pain ((score 4 to 6)). ?  ?hydrOXYzine 10 MG tablet ?Commonly known as: ATARAX ?Take 1 tablet (10 mg total) by mouth 3 (three) times daily as needed. ?What changed: reasons to take this ?  ?methocarbamol 750 MG tablet ?Commonly known as: Robaxin-750 ?Take 1 tablet (750 mg total) by mouth every 6 (six) hours as needed for muscle spasms. ?  ?metoprolol succinate 25 MG 24 hr tablet ?Commonly known as: TOPROL-XL ?Take 1 tablet (25 mg total) by mouth daily. ?  ?nystatin ointment ?Commonly known as: MYCOSTATIN ?Apply 1 application. topically daily as needed (rash). ?  ?pantoprazole 40 MG tablet ?Commonly known as: PROTONIX ?Take 1 tablet (40 mg total) by mouth daily. ?  ? ?  ? ? ? ?Signed: ?12/26/2021 Breah Joa ?10/26/2021, 8:17 AM ? ? ?

## 2021-10-26 NOTE — Evaluation (Signed)
Occupational Therapy Evaluation and Discharge ?Patient Details ?Name: Kayla Dunn ?MRN: [id number] ?DOB: 04-Feb-1981 ?Today's Date: 10/26/2021 ? ? ?History of Present Illness Kayla Dunn is a 41 yo female s/p C5-6, C6-7, C7-T1 anterior cervical discectomy with interbody fusion  ? ?Clinical Impression ?  ?This 41 yo female admitted and underwent above presents to acute OT with all education completed and post op cervical handout provided. Pt reports all her prior symptoms in arms have resolved. Acute OT will sign off.  ?   ? ?Recommendations for follow up therapy are one component of a multi-disciplinary discharge planning process, led by the attending physician.  Recommendations may be updated based on patient status, additional functional criteria and insurance authorization.  ? ?Follow Up Recommendations ? No OT follow up  ?  ?Assistance Recommended at Discharge PRN  ?Patient can return home with the following  (none) ? ?  ?Functional Status Assessment ? Patient has had a recent decline in their functional status and demonstrates the ability to make significant improvements in function in a reasonable and predictable amount of time. (no further skilled OT needed)  ?Equipment Recommendations ? None recommended by OT  ?  ?   ?Precautions / Restrictions Precautions ?Precautions: Cervical ?Precaution Booklet Issued: Yes (comment) ?Required Braces or Orthoses: Cervical Brace ?Cervical Brace: Soft collar ?Restrictions ?Weight Bearing Restrictions: No  ? ?  ? ?Mobility Bed Mobility ?  ?  ?  ?  ?  ?  ?  ?General bed mobility comments: reports no issues ?  ? ?Transfers ?  ?  ?  ?  ?  ?  ?  ?  ?  ?General transfer comment: reports not issues; has been up and moving on her own in room and hallway ?  ? ?  ?   ? ?ADL either performed or assessed with clinical judgement  ? ?ADL   ?  ?  ?  ?  ?  ?  ?  ?  ?  ?  ?  ?  ?  ?  ?  ?  ?  ?  ?  ?General ADL Comments: Educated on use of 2 cups for brushing teeth to avoid bending over the  sink, always using straws to drink from cups to avoid extending neck, not lifting more than gallon of milk, how to get in and out of bed, lower body dressing without bending over, UB clothing that will be easier to donn and doff.  ? ? ? ?Vision Baseline Vision/History: 1 Wears glasses ?Ability to See in Adequate Light: 0 Adequate ?Patient Visual Report: No change from baseline ?   ?   ?   ?   ? ?Pertinent Vitals/Pain Pain Assessment ?Pain Assessment: No/denies pain  ? ? ? ?Hand Dominance Right ?  ?Extremity/Trunk Assessment Upper Extremity Assessment ?Upper Extremity Assessment: Overall WFL for tasks assessed ?  ?  ?  ?  ?  ?Communication Communication ?Communication: No difficulties ?  ?Cognition Arousal/Alertness: Awake/alert ?Behavior During Therapy: [facility] for tasks assessed/performed ?Overall Cognitive Status: Within Functional Limits for tasks assessed ?  ?  ?  ?  ?  ?  ?  ?  ?  ?  ?  ?  ?  ?  ?  ?  ?  ?  ?  ?   ?   ?   ? ? ?Home Living Family/patient expects to be discharged to:: Private residence ?Living Arrangements: Children;Other relatives ?Available Help at Discharge: Family;Available 24 hours/day ?  ?  ?  ?  ?  ?  ?  ?  ?  ?  ?  ?  ?  Home Equipment: None ?  ?  ?  ? ?  ?Prior Functioning/Environment Prior Level of Function : Independent/Modified Independent ?  ?  ?  ?  ?  ?  ?  ?  ?  ? ?  ?  ?   ?   ?   ?OT Goals(Current goals can be found in the care plan section) Acute Rehab OT Goals ?Patient Stated Goal: to go home today  ?   ? ?   ?AM-PAC OT "6 Clicks" Daily Activity     ?Outcome Measure Help from another person eating meals?: None ?Help from another person taking care of personal grooming?: None ?Help from another person toileting, which includes using toliet, bedpan, or urinal?: None ?Help from another person bathing (including washing, rinsing, drying)?: None ?Help from another person to put on and taking off regular upper body clothing?: None ?Help from another person to put on and taking off regular  lower body clothing?: None ?6 Click Score: 24 ?  ?End of Session Equipment Utilized During Treatment: Cervical collar ?Nurse Communication:  (no further OT needs) ? ?Activity Tolerance: Patient tolerated treatment well ?Patient left: in bed;with call bell/phone within reach ? ?OT Visit Diagnosis: Muscle weakness (generalized) (M62.81) (now resolved)  ?              ?Time: 7342-8768 ?OT Time Calculation (min): 16 min ?Charges:  OT General Charges ?$OT Visit: 1 Visit ?OT Evaluation ?$OT Eval Moderate Complexity: 1 Mod ? ?Kayla Dunn, OTR/L ?Acute Rehab Services ?Pager [phone] ?Office [phone] ? ? ? ?Kayla Dunn ?10/26/2021, 8:34 AM ?

## 2021-10-26 NOTE — Plan of Care (Signed)
Pt doing well. Pt given D/C instructions with verbal understanding. Rx's were sent to the pharmacy by MD. Pt's incision is clean and dry with no sign of infection. Pt's IV was removed prior to D/C. Pt D/C'd home via walking per MD order. Pt is stable @ D/C and has no other needs at this time. Shaheim Mahar, RN  

## 2021-10-27 ENCOUNTER — Telehealth: Payer: Self-pay

## 2021-10-27 NOTE — Telephone Encounter (Signed)
Transition Care Management Unsuccessful Follow-up Telephone Call ? ?Date of discharge and from where:  10/26/2021 from [facility] ? ?Attempts:  1st Attempt ? ?Reason for unsuccessful TCM follow-up call:  Left voice message ? ? ? ?

## 2021-10-28 NOTE — Telephone Encounter (Signed)
Transition Care Management Follow-up Telephone Call ?Date of discharge and from where: 10/26/2021 from [facility] ?How have you been since you were released from the hospital?  Patient stated that she is sore and swollen still.  ?Any questions or concerns? No ? ?Items Reviewed: ?Did the pt receive and understand the discharge instructions provided? Yes  ?Medications obtained and verified? Yes  ?Other? No  ?Any new allergies since your discharge? No  ?Dietary orders reviewed? No ?Do you have support at home? Yes  ? ?Functional Questionnaire: (I = Independent and D = Dependent) ?ADLs: I ? ?Bathing/Dressing- I ? ?Meal Prep- I ? ?Eating- I ? ?Maintaining continence- I ? ?Transferring/Ambulation- I ? ?Managing Meds- I ? ? ?Follow up appointments reviewed: ? ?PCP Hospital f/u appt confirmed? Yes  Scheduled to see Marcelline Deist, NP on 12/06/2021 @ 1:40pm. ?[facility] f/u appt confirmed? Yes  Scheduled to see Neurosurgeon.  ?Are transportation arrangements needed? No  ?If their condition worsens, is the pt aware to call PCP or go to the Emergency Dept.? Yes ?Was the patient provided with contact information for the PCP's office or ED? Yes ?Was to pt encouraged to call back with questions or concerns? Yes ? ?

## 2021-11-01 ENCOUNTER — Other Ambulatory Visit: Payer: Self-pay | Admitting: Nurse Practitioner

## 2021-11-01 DIAGNOSIS — K219 Gastro-esophageal reflux disease without esophagitis: Secondary | ICD-10-CM

## 2021-11-01 DIAGNOSIS — M2559 Pain in other specified joint: Secondary | ICD-10-CM

## 2021-11-08 ENCOUNTER — Telehealth: Payer: Self-pay | Admitting: Nurse Practitioner

## 2021-11-08 NOTE — Telephone Encounter (Signed)
Hydroxine and anxiety meds refill request

## 2021-11-09 ENCOUNTER — Other Ambulatory Visit: Payer: Self-pay | Admitting: Nurse Practitioner

## 2021-11-09 DIAGNOSIS — L299 Pruritus, unspecified: Secondary | ICD-10-CM

## 2021-11-09 MED ORDER — HYDROXYZINE HCL 10 MG PO TABS: 10.0000 mg | ORAL_TABLET | Freq: Three times a day (TID) | ORAL | 0 refills | Status: DC | PRN

## 2021-11-17 ENCOUNTER — Other Ambulatory Visit: Payer: Self-pay | Admitting: Nurse Practitioner

## 2021-11-17 DIAGNOSIS — I10 Essential (primary) hypertension: Secondary | ICD-10-CM

## 2021-12-02 DIAGNOSIS — M4310 Spondylolisthesis, site unspecified: Secondary | ICD-10-CM | POA: Diagnosis not present

## 2021-12-02 DIAGNOSIS — Z6826 Body mass index (BMI) 26.0-26.9, adult: Secondary | ICD-10-CM | POA: Diagnosis not present

## 2021-12-06 ENCOUNTER — Telehealth: Payer: Medicaid Other | Admitting: Nurse Practitioner

## 2021-12-07 ENCOUNTER — Ambulatory Visit: Payer: Medicaid Other | Admitting: Nurse Practitioner

## 2021-12-15 ENCOUNTER — Ambulatory Visit (INDEPENDENT_AMBULATORY_CARE_PROVIDER_SITE_OTHER): Payer: Medicaid Other | Admitting: Nurse Practitioner

## 2021-12-15 ENCOUNTER — Encounter: Payer: Self-pay | Admitting: Nurse Practitioner

## 2021-12-15 VITALS — BP 117/73 | HR 76 | Temp 98.0°F | Ht 61.5 in | Wt 144.4 lb

## 2021-12-15 DIAGNOSIS — Z01419 Encounter for gynecological examination (general) (routine) without abnormal findings: Secondary | ICD-10-CM | POA: Diagnosis not present

## 2021-12-15 DIAGNOSIS — E2839 Other primary ovarian failure: Secondary | ICD-10-CM | POA: Diagnosis not present

## 2021-12-15 DIAGNOSIS — Z0000 Encounter for general adult medical examination without abnormal findings: Secondary | ICD-10-CM | POA: Diagnosis not present

## 2021-12-15 LAB — RESULTS CONSOLE HPV: CHL HPV: NEGATIVE

## 2021-12-15 NOTE — Progress Notes (Signed)
[place], [place]  [address] Phone:  [phone]   Fax:  [phone] Subjective:   Patient ID: Kayla Dunn, female    DOB: [birth year], 41 y.o.   MRN: [id number]  Chief Complaint  Patient presents with   Annual Exam    Patient is here today for her annual exam with pap smear and breast exam. Patient would also like STD testing as well.   [name] 41 y.o. female  has a past medical history of Anemia, Arthritis, Bronchitis, Depression, GERD (gastroesophageal reflux disease), Heart murmur, Hypertension, Insomnia, Insomnia, Migraines, Pancreatitis, Perimenopausal, PID (acute pelvic inflammatory disease), Pregnancy induced hypertension, Preterm labor, PTSD (post-traumatic stress disorder), and Seasonal allergies. To the [facility] for wellness visit and pap smear.   Patient has had one partner in the past 6 mths, unprotected. Denies any vaginal or gyn concerns today. Endorses a family history of breast cancer and completes SBE regularly. Continues to take estrogen as prescribed with no verbalized complications and/ side effects. Denies any other concerns today.  Denies any fatigue, chest pain, shortness of breath, HA or dizziness. Denies any blurred vision, numbness or tingling.   Past Medical History:  Diagnosis Date   Anemia    Arthritis    Bronchitis    Depression    no meds, fine right now   GERD (gastroesophageal reflux disease)    Heart murmur    Hypertension    "havent been taking my medicine in awhile"   Insomnia    Insomnia    Migraines    Pancreatitis    Perimenopausal    PID (acute pelvic inflammatory disease)    Pregnancy induced hypertension    Preterm labor    PTSD (post-traumatic stress disorder)    Seasonal allergies     Past Surgical History:  Procedure Laterality Date   ANTERIOR CERVICAL DECOMP/DISCECTOMY FUSION N/A 10/25/2021   Procedure: Anterior Cervical Decompression Fusion - Cervical  five-Cervical six - Cervical six-Cervical seven - Cervical seven-Thoracic one;  Surgeon: Earnie Larsson, MD;  Location: [place];  Service: Neurosurgery;  Laterality: N/A;   CESAREAN SECTION     CHOLECYSTECTOMY N/A 08/27/2013   Procedure: LAPAROSCOPIC CHOLECYSTECTOMY WITH INTRAOPERATIVE CHOLANGIOGRAM;  Surgeon: Pedro Earls, MD;  Location: [facility];  Service: General;  Laterality: N/A;   DILATION AND CURETTAGE OF UTERUS     OVARY SURGERY     TUBAL LIGATION      Family History  Problem Relation Age of Onset   Hypertension Father    Liver disease Father    Stroke Father    Cancer Mother        colon   Arthritis Mother    Diabetes Paternal Grandmother    Hypertension Paternal Grandmother    Heart disease Paternal Grandmother     Social History   Socioeconomic History   Marital status: Significant Other    Spouse name: Not on file   Number of children: 4   Years of education: Not on file   Highest education level: Not on file  Occupational History   Not on file  Tobacco Use   Smoking status: Light Smoker    Packs/day: 0.25    Years: 12.00    Total pack years: 3.00    Types: Cigarettes   Smokeless tobacco: Never  Vaping Use   Vaping Use: Never used  Substance and Sexual Activity   Alcohol use: Yes    Comment: occ   Drug  use: Yes    Types: Marijuana    Comment: 4-5 times a week   Sexual activity: Yes    Birth control/protection: Surgical  Other Topics Concern   Not on file  Social History Narrative   Pt is smoking delta 8   Social Determinants of Health   Financial Resource Strain: Not on file  Food Insecurity: Not on file  Transportation Needs: Not on file  Physical Activity: Not on file  Stress: Not on file  Social Connections: Not on file  Intimate Partner Violence: Not on file    Outpatient Medications Prior to Visit  Medication Sig Dispense Refill   albuterol (PROVENTIL) (2.5 MG/3ML) 0.083% nebulizer solution Take 2.5 mg by nebulization every 6 (six) hours  as needed for wheezing or shortness of breath.     amLODipine (NORVASC) 10 MG tablet TAKE 1 TABLET BY MOUTH EVERY DAY 30 tablet 0   estradiol (CLIMARA - DOSED IN MG/24 HR) 0.025 mg/24hr patch PLACE 1 PATCH (0.025 MG TOTAL) ONTO THE SKIN ONCE A WEEK. (Patient taking differently: Place 0.025 mg onto the skin once a week. Monday) 12 patch 5   ferrous sulfate 325 (65 FE) MG tablet Take 1 tablet (325 mg total) by mouth daily. 30 tablet 3   HYDROcodone-acetaminophen (NORCO/VICODIN) 5-325 MG tablet Take 1 tablet by mouth every 4 (four) hours as needed for moderate pain ((score 4 to 6)). 30 tablet 0   hydrOXYzine (ATARAX) 10 MG tablet Take 1 tablet (10 mg total) by mouth 3 (three) times daily as needed. 30 tablet 0   methocarbamol (ROBAXIN) 750 MG tablet TAKE 1 TABLET (750 MG TOTAL) BY MOUTH EVERY 6 (SIX) HOURS AS NEEDED FOR MUSCLE SPASMS 30 tablet 0   metoprolol succinate (TOPROL-XL) 25 MG 24 hr tablet Take 1 tablet (25 mg total) by mouth daily. 90 tablet 3   pantoprazole (PROTONIX) 40 MG tablet TAKE 1 TABLET BY MOUTH EVERY DAY 90 tablet 1   nystatin ointment (MYCOSTATIN) Apply 1 application. topically daily as needed (rash). (Patient not taking: Reported on 12/15/2021)     No facility-administered medications prior to visit.    Allergies  Allergen Reactions   Aspirin Anaphylaxis   Hydromorphone Hcl Anaphylaxis   Aloe Vera Hives and Itching   Dust Mite Extract     Seasonal allergies    Morphine Hives   Shrimp [Shellfish Allergy] Swelling    Review of Systems  Constitutional: Negative.  Negative for chills, fever and malaise/fatigue.  Respiratory:  Negative for cough and shortness of breath.   Cardiovascular:  Negative for chest pain, palpitations and leg swelling.  Gastrointestinal:  Negative for abdominal pain, blood in stool, constipation, diarrhea, nausea and vomiting.  Genitourinary: Negative.   Skin: Negative.   Neurological: Negative.   Psychiatric/Behavioral:  Negative for depression.  The patient is not nervous/anxious.   All other systems reviewed and are negative.      Objective:    Physical Exam Vitals reviewed.  Constitutional:      General: She is not in acute distress.    Appearance: Normal appearance.  HENT:     Head: Normocephalic.  Cardiovascular:     Rate and Rhythm: Normal rate and regular rhythm.     Pulses: Normal pulses.     Heart sounds: Normal heart sounds.     Comments: No obvious peripheral edema Pulmonary:     Effort: Pulmonary effort is normal.     Breath sounds: Normal breath sounds.  Genitourinary:    Vagina: Normal.  Cervix: No cervical motion tenderness.     Uterus: Normal.      Rectum: Normal.  Skin:    General: Skin is warm and dry.     Capillary Refill: Capillary refill takes less than 2 seconds.  Neurological:     Mental Status: She is alert.  Psychiatric:        Mood and Affect: Mood normal.        Behavior: Behavior normal.        Thought Content: Thought content normal.        Judgment: Judgment normal.     BP 117/73   Pulse 76   Temp 98 F (36.7 C)   Ht 5' 1.5" (1.562 m)   Wt 144 lb 6.4 oz (65.5 kg)   LMP 12/06/2021 (Exact Date)   SpO2 98%   BMI 26.84 kg/m  Wt Readings from Last 3 Encounters:  12/15/21 144 lb 6.4 oz (65.5 kg)  10/25/21 145 lb (65.8 kg)  10/20/21 144 lb (65.3 kg)    Immunization History  Administered Date(s) Administered   Influenza Whole 04/28/2010   Influenza,inj,Quad PF,6+ Mos 03/03/2014, 05/14/2018   Td 07/21/1997    Diabetic Foot Exam - Simple   No data filed     Lab Results  Component Value Date   TSH 1.380 03/08/2018   Lab Results  Component Value Date   WBC 5.5 12/15/2021   HGB 8.7 (L) 12/15/2021   HCT 29.1 (L) 12/15/2021   MCV 80 12/15/2021   PLT 503 (H) 12/15/2021   Lab Results  Component Value Date   NA 140 12/15/2021   K 4.1 12/15/2021   CO2 23 12/15/2021   GLUCOSE 82 12/15/2021   BUN 10 12/15/2021   CREATININE 0.68 12/15/2021   BILITOT <0.2  12/15/2021   ALKPHOS 90 12/15/2021   AST 17 12/15/2021   ALT 10 12/15/2021   PROT 7.3 12/15/2021   ALBUMIN 4.5 12/15/2021   CALCIUM 9.0 12/15/2021   ANIONGAP 6 10/20/2021   EGFR 114 12/15/2021   Lab Results  Component Value Date   CHOL 134 12/15/2021   CHOL 162 07/09/2021   Lab Results  Component Value Date   HDL 75 12/15/2021   HDL 87 07/09/2021   Lab Results  Component Value Date   LDLCALC 43 12/15/2021   LDLCALC 62 07/09/2021   Lab Results  Component Value Date   TRIG 83 12/15/2021   TRIG 70 07/09/2021   Lab Results  Component Value Date   CHOLHDL 1.8 12/15/2021   CHOLHDL 1.9 07/09/2021   Lab Results  Component Value Date   HGBA1C 5.3 12/15/2021   HGBA1C 4.9 09/03/2021   HGBA1C 4.9 09/03/2021   HGBA1C 4.9 (A) 09/03/2021   HGBA1C 4.9 09/03/2021       Assessment & Plan:   Problem List Items Addressed This Visit   None Visit Diagnoses     Encounter for wellness examination in adult    -  Primary   Relevant Orders   CBC with Differential/Platelet (Completed)   Comprehensive metabolic panel (Completed)   Lipid panel (Completed)   Hemoglobin A1c (Completed): 5.3, wnl    Women's annual routine gynecological examination       Relevant Orders   NuSwab Vaginitis Plus (VG+) (Completed)   Pap IG and Chlamydia/Gonococcus, NAA ([facility])   HIV antibody (with reflex) (Completed) Encouraged continue SBE   Estrogen deficiency       Relevant Orders   Estrogens, Total (Completed) Encouraged to continue  taking medications as prescribed    Follow up in 3 mths for reevaluation of estrogen level , sooner as needed     I am having Katilin N. Owens Shark [employer]" maintain her ferrous sulfate, nystatin ointment, metoprolol succinate, estradiol, albuterol, HYDROcodone-acetaminophen, pantoprazole, methocarbamol, hydrOXYzine, and amLODipine.  No orders of the defined types were placed in this encounter.    Teena Dunk, NP

## 2021-12-17 ENCOUNTER — Telehealth: Payer: Self-pay

## 2021-12-17 ENCOUNTER — Other Ambulatory Visit: Payer: Self-pay | Admitting: Nurse Practitioner

## 2021-12-17 DIAGNOSIS — B9689 Other specified bacterial agents as the cause of diseases classified elsewhere: Secondary | ICD-10-CM

## 2021-12-17 MED ORDER — METRONIDAZOLE 500 MG PO TABS: 500.0000 mg | ORAL_TABLET | Freq: Two times a day (BID) | ORAL | 0 refills | Status: AC

## 2021-12-17 NOTE — Telephone Encounter (Signed)
Ferrous sulfate Hydroxyzine Methocarbamol

## 2021-12-18 LAB — LIPID PANEL
Chol/HDL Ratio: 1.8 ratio (ref 0.0–4.4)
Cholesterol, Total: 134 mg/dL (ref 100–199)
HDL: 75 mg/dL (ref >39)
LDL Chol Calc (NIH): 43 mg/dL (ref 0–99)
Triglycerides: 83 mg/dL (ref 0–149)
VLDL Cholesterol Cal: 16 mg/dL (ref 5–40)

## 2021-12-18 LAB — NUSWAB VAGINITIS PLUS (VG+)
BVAB 2: HIGH Score — AB
Candida albicans, NAA: NEGATIVE
Candida glabrata, NAA: NEGATIVE
Chlamydia trachomatis, NAA: NEGATIVE
Megasphaera 1: HIGH Score — AB
Neisseria gonorrhoeae, NAA: NEGATIVE
Trich vag by NAA: NEGATIVE

## 2021-12-18 LAB — COMPREHENSIVE METABOLIC PANEL
ALT: 10 IU/L (ref 0–32)
AST: 17 IU/L (ref 0–40)
Albumin/Globulin Ratio: 1.6 (ref 1.2–2.2)
Albumin: 4.5 g/dL (ref 3.8–4.8)
Alkaline Phosphatase: 90 IU/L (ref 44–121)
BUN/Creatinine Ratio: 15 (ref 9–23)
BUN: 10 mg/dL (ref 6–20)
Bilirubin Total: <0.2 mg/dL (ref 0.0–1.2)
CO2: 23 mmol/L (ref 20–29)
Calcium: 9 mg/dL (ref 8.7–10.2)
Chloride: 104 mmol/L (ref 96–106)
Creatinine, Ser: 0.68 mg/dL (ref 0.57–1.00)
Globulin, Total: 2.8 g/dL (ref 1.5–4.5)
Glucose: 82 mg/dL (ref 70–99)
Potassium: 4.1 mmol/L (ref 3.5–5.2)
Sodium: 140 mmol/L (ref 134–144)
Total Protein: 7.3 g/dL (ref 6.0–8.5)
eGFR: 114 mL/min/{1.73_m2} (ref >59)

## 2021-12-18 LAB — CBC WITH DIFFERENTIAL/PLATELET
Basophils Absolute: 0.1 10*3/uL (ref 0.0–0.2)
Basos: 1 %
EOS (ABSOLUTE): 0.1 10*3/uL (ref 0.0–0.4)
Eos: 2 %
Hematocrit: 29.1 % — ABNORMAL LOW (ref 34.0–46.6)
Hemoglobin: 8.7 g/dL — ABNORMAL LOW (ref 11.1–15.9)
Immature Grans (Abs): 0 10*3/uL (ref 0.0–0.1)
Immature Granulocytes: 0 %
Lymphocytes Absolute: 2.3 10*3/uL (ref 0.7–3.1)
Lymphs: 41 %
MCH: 24 pg — ABNORMAL LOW (ref 26.6–33.0)
MCHC: 29.9 g/dL — ABNORMAL LOW (ref 31.5–35.7)
MCV: 80 fL (ref 79–97)
Monocytes Absolute: 0.6 10*3/uL (ref 0.1–0.9)
Monocytes: 11 %
Neutrophils Absolute: 2.5 10*3/uL (ref 1.4–7.0)
Neutrophils: 45 %
Platelets: 503 10*3/uL — ABNORMAL HIGH (ref 150–450)
RBC: 3.63 x10E6/uL — ABNORMAL LOW (ref 3.77–5.28)
RDW: 15.6 % — ABNORMAL HIGH (ref 11.7–15.4)
WBC: 5.5 10*3/uL (ref 3.4–10.8)

## 2021-12-18 LAB — ESTROGENS, TOTAL: Estrogen: 161 pg/mL

## 2021-12-18 LAB — HIV ANTIBODY (ROUTINE TESTING W REFLEX): HIV Screen 4th Generation wRfx: NONREACTIVE

## 2021-12-18 LAB — HEMOGLOBIN A1C
Est. average glucose Bld gHb Est-mCnc: 105 mg/dL
Hgb A1c MFr Bld: 5.3 % (ref 4.8–5.6)

## 2021-12-20 ENCOUNTER — Other Ambulatory Visit: Payer: Self-pay | Admitting: Nurse Practitioner

## 2021-12-20 ENCOUNTER — Telehealth: Payer: Self-pay

## 2021-12-20 DIAGNOSIS — M2559 Pain in other specified joint: Secondary | ICD-10-CM

## 2021-12-20 DIAGNOSIS — L299 Pruritus, unspecified: Secondary | ICD-10-CM

## 2021-12-20 DIAGNOSIS — D508 Other iron deficiency anemias: Secondary | ICD-10-CM

## 2021-12-20 MED ORDER — HYDROXYZINE HCL 10 MG PO TABS: 10.0000 mg | ORAL_TABLET | Freq: Three times a day (TID) | ORAL | 0 refills | Status: DC | PRN

## 2021-12-20 MED ORDER — FERROUS SULFATE 325 (65 FE) MG PO TABS: 325.0000 mg | ORAL_TABLET | Freq: Every day | ORAL | 3 refills | Status: DC

## 2021-12-20 MED ORDER — METHOCARBAMOL 750 MG PO TABS: 750.0000 mg | ORAL_TABLET | Freq: Four times a day (QID) | ORAL | 0 refills | Status: DC | PRN

## 2021-12-22 ENCOUNTER — Other Ambulatory Visit: Payer: Self-pay | Admitting: Nurse Practitioner

## 2021-12-22 DIAGNOSIS — I10 Essential (primary) hypertension: Secondary | ICD-10-CM

## 2021-12-22 LAB — PAP IG AND CT-NG NAA
Chlamydia, Nuc. Acid Amp: NEGATIVE
Gonococcus by Nucleic Acid Amp: NEGATIVE

## 2021-12-23 NOTE — Telephone Encounter (Signed)
All medications listed were sent to the pharmacy on 7/3. Confirmed with the pharmacy, meds have all been picked up.

## 2021-12-31 NOTE — Telephone Encounter (Signed)
No additional notes needed  

## 2022-01-20 DIAGNOSIS — M4310 Spondylolisthesis, site unspecified: Secondary | ICD-10-CM | POA: Diagnosis not present

## 2022-01-20 DIAGNOSIS — Z6827 Body mass index (BMI) 27.0-27.9, adult: Secondary | ICD-10-CM | POA: Diagnosis not present

## 2022-01-26 ENCOUNTER — Telehealth: Payer: Self-pay

## 2022-01-26 DIAGNOSIS — I10 Essential (primary) hypertension: Secondary | ICD-10-CM

## 2022-01-26 DIAGNOSIS — K219 Gastro-esophageal reflux disease without esophagitis: Secondary | ICD-10-CM

## 2022-01-26 DIAGNOSIS — L299 Pruritus, unspecified: Secondary | ICD-10-CM

## 2022-01-26 DIAGNOSIS — N898 Other specified noninflammatory disorders of vagina: Secondary | ICD-10-CM

## 2022-01-26 MED ORDER — AMLODIPINE BESYLATE 10 MG PO TABS: 10.0000 mg | ORAL_TABLET | Freq: Every day | ORAL | 0 refills | Status: DC

## 2022-01-26 MED ORDER — PANTOPRAZOLE SODIUM 40 MG PO TBEC: 40.0000 mg | DELAYED_RELEASE_TABLET | Freq: Every day | ORAL | 1 refills | Status: DC

## 2022-01-26 MED ORDER — METOPROLOL SUCCINATE ER 25 MG PO TB24: 25.0000 mg | ORAL_TABLET | Freq: Every day | ORAL | 3 refills | Status: DC

## 2022-01-26 MED ORDER — ESTRADIOL 0.025 MG/24HR TD PTWK: 0.0250 mg | MEDICATED_PATCH | TRANSDERMAL | 5 refills | Status: DC

## 2022-01-26 NOTE — Telephone Encounter (Signed)
Estradiol  Methocarbamol Hydroxyzine Amlodipine Metoprolol Pantoprazole   [facility] [address]

## 2022-01-26 NOTE — Telephone Encounter (Signed)
Refills sent for: Estradiol  Amlodipine Metoprolol Pantoprazole  Please advise if ok to refill: Methocarbamol Hydroxyzine  Thank you!

## 2022-01-26 NOTE — Addendum Note (Signed)
Addended by: Shelby Dubin on: 01/26/2022 04:39 PM   Modules accepted: Orders

## 2022-02-20 ENCOUNTER — Other Ambulatory Visit: Payer: Self-pay | Admitting: Nurse Practitioner

## 2022-02-20 DIAGNOSIS — I10 Essential (primary) hypertension: Secondary | ICD-10-CM

## 2022-03-14 ENCOUNTER — Ambulatory Visit (INDEPENDENT_AMBULATORY_CARE_PROVIDER_SITE_OTHER): Payer: Medicaid Other | Admitting: Nurse Practitioner

## 2022-03-14 ENCOUNTER — Encounter: Payer: Self-pay | Admitting: Nurse Practitioner

## 2022-03-14 VITALS — BP 135/84 | HR 71 | Temp 98.2°F | Ht 61.5 in | Wt 140.0 lb

## 2022-03-14 DIAGNOSIS — D509 Iron deficiency anemia, unspecified: Secondary | ICD-10-CM | POA: Diagnosis not present

## 2022-03-14 DIAGNOSIS — M2559 Pain in other specified joint: Secondary | ICD-10-CM

## 2022-03-14 MED ORDER — METHOCARBAMOL 750 MG PO TABS: 750.0000 mg | ORAL_TABLET | Freq: Four times a day (QID) | ORAL | 0 refills | Status: DC | PRN

## 2022-03-14 NOTE — Progress Notes (Signed)
@Patient  ID: , female    DOB: Nov 28, 1980, 41 y.o.   MRN: [id number]  Chief Complaint  Patient presents with   Follow-up    Pt states she would like to be on iron injection/ infusion instead of the pill it makes her nauseous    Referring provider: No ref. provider found   HPI  [id number] 41 y.o. female  has a past medical history of Anemia, Arthritis, Bronchitis, Depression, GERD (gastroesophageal reflux disease), Heart murmur, Hypertension, Insomnia, Insomnia, Migraines, Pancreatitis, Perimenopausal, PID (acute pelvic inflammatory disease), Pregnancy induced hypertension, Preterm labor, PTSD (post-traumatic stress disorder), and Seasonal allergies.    Patient presents today for a follow up. She states that she has a history of anemia. She does have sickle cell trait. She states that she cannot tolerate iron supplements. Will check labs today. Denies f/c/s, n/v/d, hemoptysis, PND, leg swelling Denies chest pain or edema    Allergies  Allergen Reactions   Aspirin Anaphylaxis   Hydromorphone Hcl Anaphylaxis   Aloe Vera Hives and Itching   Dust Mite Extract     Seasonal allergies    Morphine Hives   Shrimp [Shellfish Allergy] Swelling    Immunization History  Administered Date(s) Administered   Influenza Whole 04/28/2010   Influenza,inj,Quad PF,6+ Mos 03/03/2014, 05/14/2018   Td 07/21/1997    Past Medical History:  Diagnosis Date   Anemia    Arthritis    Bronchitis    Depression    no meds, fine right now   GERD (gastroesophageal reflux disease)    Heart murmur    Hypertension    "havent been taking my medicine in awhile"   Insomnia    Insomnia    Migraines    Pancreatitis    Perimenopausal    PID (acute pelvic inflammatory disease)    Pregnancy induced hypertension    Preterm labor    PTSD (post-traumatic stress disorder)    Seasonal allergies     Tobacco History: Social History   Tobacco Use  Smoking Status Light Smoker   Packs/day:  0.25   Years: 12.00   Total pack years: 3.00   Types: Cigarettes  Smokeless Tobacco Never   Ready to quit: Not Answered Counseling given: Not Answered   Outpatient Encounter Medications as of 03/14/2022  Medication Sig   albuterol (PROVENTIL) (2.5 MG/3ML) 0.083% nebulizer solution Take 2.5 mg by nebulization every 6 (six) hours as needed for wheezing or shortness of breath.   amLODipine (NORVASC) 10 MG tablet TAKE 1 TABLET BY MOUTH EVERY DAY   estradiol (CLIMARA - DOSED IN MG/24 HR) 0.025 mg/24hr patch Place 1 patch (0.025 mg total) onto the skin once a week.   metoprolol succinate (TOPROL-XL) 25 MG 24 hr tablet Take 1 tablet (25 mg total) by mouth daily.   pantoprazole (PROTONIX) 40 MG tablet Take 1 tablet (40 mg total) by mouth daily.   [DISCONTINUED] methocarbamol (ROBAXIN) 750 MG tablet Take 1 tablet (750 mg total) by mouth every 6 (six) hours as needed for muscle spasms.   ferrous sulfate 325 (65 FE) MG tablet Take 1 tablet (325 mg total) by mouth daily.   HYDROcodone-acetaminophen (NORCO/VICODIN) 5-325 MG tablet Take 1 tablet by mouth every 4 (four) hours as needed for moderate pain ((score 4 to 6)).   hydrOXYzine (ATARAX) 10 MG tablet Take 1 tablet (10 mg total) by mouth 3 (three) times daily as needed.   methocarbamol (ROBAXIN) 750 MG tablet Take 1 tablet (750 mg total) by mouth every  6 (six) hours as needed for muscle spasms.   nystatin ointment (MYCOSTATIN) Apply 1 application. topically daily as needed (rash). (Patient not taking: Reported on 12/15/2021)   No facility-administered encounter medications on file as of 03/14/2022.     Review of Systems  Review of Systems  Constitutional: Negative.   HENT: Negative.    Cardiovascular: Negative.   Gastrointestinal: Negative.   Allergic/Immunologic: Negative.   Neurological: Negative.   Psychiatric/Behavioral: Negative.         Physical Exam  BP 135/84 (BP Location: Left Arm, Patient Position: Sitting, Cuff Size: Normal)    Pulse 71   Temp 98.2 F (36.8 C)   Ht 5' 1.5" (1.562 m)   Wt 140 lb (63.5 kg)   SpO2 100%   BMI 26.02 kg/m   Wt Readings from Last 5 Encounters:  03/14/22 140 lb (63.5 kg)  12/15/21 144 lb 6.4 oz (65.5 kg)  10/25/21 145 lb (65.8 kg)  10/20/21 144 lb (65.3 kg)  09/03/21 136 lb 6.4 oz (61.9 kg)     Physical Exam Vitals and nursing note reviewed.  Constitutional:      General: She is not in acute distress.    Appearance: She is well-developed.  Cardiovascular:     Rate and Rhythm: Normal rate and regular rhythm.  Pulmonary:     Effort: Pulmonary effort is normal.     Breath sounds: Normal breath sounds.  Neurological:     Mental Status: She is alert and oriented to person, place, and time.      Lab Results:  CBC    Component Value Date/Time   WBC 5.5 12/15/2021 1554   WBC 7.7 10/20/2021 1055   RBC 3.63 (L) 12/15/2021 1554   RBC 3.99 10/20/2021 1055   HGB 8.7 (L) 12/15/2021 1554   HCT 29.1 (L) 12/15/2021 1554   PLT 503 (H) 12/15/2021 1554   MCV 80 12/15/2021 1554   MCH 24.0 (L) 12/15/2021 1554   MCH 24.6 (L) 10/20/2021 1055   MCHC 29.9 (L) 12/15/2021 1554   MCHC 30.8 10/20/2021 1055   RDW 15.6 (H) 12/15/2021 1554   LYMPHSABS 2.3 12/15/2021 1554   MONOABS 0.7 03/09/2018 1905   EOSABS 0.1 12/15/2021 1554   BASOSABS 0.1 12/15/2021 1554    BMET    Component Value Date/Time   NA 140 12/15/2021 1554   K 4.1 12/15/2021 1554   CL 104 12/15/2021 1554   CO2 23 12/15/2021 1554   GLUCOSE 82 12/15/2021 1554   GLUCOSE 86 10/20/2021 1055   BUN 10 12/15/2021 1554   CREATININE 0.68 12/15/2021 1554   CREATININE 0.66 03/03/2014 1105   CALCIUM 9.0 12/15/2021 1554   GFRNONAA >60 10/20/2021 1055   GFRAA >60 03/10/2018 1217    BNP No results found for: "BNP"  ProBNP No results found for: "PROBNP"  Imaging: No results found.   Assessment & Plan:   Microcytic anemia - Iron, TIBC and Ferritin Panel - CBC - Comprehensive metabolic panel  AVS was  discussed with patient. Patient was asked if there were any other questions or concerns today. Patient stated there were no other questions or concerns. Patient was advised that they needed lab work and then could check out and schedule follow up visit. Patient voiced understanding. Appointment was completed.   Patient states that all issues and concerns were addressed during visit today.   Follow up:  Follow up in 6 months or sooner if needed     Fenton Foy, NP 03/14/2022

## 2022-03-14 NOTE — Assessment & Plan Note (Signed)
-   Iron, TIBC and Ferritin Panel - CBC - Comprehensive metabolic panel  AVS was discussed with patient. Patient was asked if there were any other questions or concerns today. Patient stated there were no other questions or concerns. Patient was advised that they needed lab work and then could check out and schedule follow up visit. Patient voiced understanding. Appointment was completed.   Patient states that all issues and concerns were addressed during visit today.   Follow up:  Follow up in 6 months or sooner if needed

## 2022-03-14 NOTE — Patient Instructions (Addendum)
1. Microcytic anemia  - Iron, TIBC and Ferritin Panel - CBC - Comprehensive metabolic panel  AVS was discussed with patient. Patient was asked if there were any other questions or concerns today. Patient stated there were no other questions or concerns. Patient was advised that they needed lab work and then could check out and schedule follow up visit. Patient voiced understanding. Appointment was completed.   Patient states that all issues and concerns were addressed during visit today.   Follow up:  Follow up in 6 months or sooner if needed

## 2022-03-15 LAB — COMPREHENSIVE METABOLIC PANEL
ALT: 11 IU/L (ref 0–32)
AST: 21 IU/L (ref 0–40)
Albumin/Globulin Ratio: 1.6 (ref 1.2–2.2)
Albumin: 4.6 g/dL (ref 3.9–4.9)
Alkaline Phosphatase: 103 IU/L (ref 44–121)
BUN/Creatinine Ratio: 20 (ref 9–23)
BUN: 15 mg/dL (ref 6–24)
Bilirubin Total: 0.3 mg/dL (ref 0.0–1.2)
CO2: 22 mmol/L (ref 20–29)
Calcium: 9.5 mg/dL (ref 8.7–10.2)
Chloride: 101 mmol/L (ref 96–106)
Creatinine, Ser: 0.74 mg/dL (ref 0.57–1.00)
Globulin, Total: 2.8 g/dL (ref 1.5–4.5)
Glucose: 85 mg/dL (ref 70–99)
Potassium: 4.3 mmol/L (ref 3.5–5.2)
Sodium: 138 mmol/L (ref 134–144)
Total Protein: 7.4 g/dL (ref 6.0–8.5)
eGFR: 105 mL/min/{1.73_m2} (ref >59)

## 2022-03-15 LAB — IRON,TIBC AND FERRITIN PANEL
Ferritin: 11 ng/mL — ABNORMAL LOW (ref 15–150)
Iron Saturation: 3 % — CL (ref 15–55)
Iron: 17 ug/dL — ABNORMAL LOW (ref 27–159)
Total Iron Binding Capacity: 537 ug/dL — ABNORMAL HIGH (ref 250–450)
UIBC: 520 ug/dL — ABNORMAL HIGH (ref 131–425)

## 2022-03-15 LAB — CBC
Hematocrit: 32.5 % — ABNORMAL LOW (ref 34.0–46.6)
Hemoglobin: 9.4 g/dL — ABNORMAL LOW (ref 11.1–15.9)
MCH: 22.4 pg — ABNORMAL LOW (ref 26.6–33.0)
MCHC: 28.9 g/dL — ABNORMAL LOW (ref 31.5–35.7)
MCV: 78 fL — ABNORMAL LOW (ref 79–97)
Platelets: 443 10*3/uL (ref 150–450)
RBC: 4.19 x10E6/uL (ref 3.77–5.28)
RDW: 15.5 % — ABNORMAL HIGH (ref 11.7–15.4)
WBC: 8.4 10*3/uL (ref 3.4–10.8)

## 2022-03-16 ENCOUNTER — Telehealth: Payer: Self-pay | Admitting: Physician Assistant

## 2022-03-16 ENCOUNTER — Other Ambulatory Visit: Payer: Self-pay | Admitting: Nurse Practitioner

## 2022-03-16 DIAGNOSIS — D509 Iron deficiency anemia, unspecified: Secondary | ICD-10-CM

## 2022-03-16 NOTE — Telephone Encounter (Signed)
Scheduled appt per 9/27 referral. Pt is aware of appt date and time. Pt is aware to arrive 15 mins prior to appt time and to bring and updated insurance card. Pt is aware of appt location.   

## 2022-03-30 NOTE — Progress Notes (Signed)
[facility] [phone]   [phone]  CONSULT NOTE  REFERRING PHYSICIAN: Angus Seller  REASON FOR CONSULTATION:  Anemia   [name] is a 41 y.o. female with a past medical history significant for sickle cell trait, migraines, GERD, anemia, depression, pancreatitis, PTSD, pelvic inflammatory disease, hypertension, and heart murmur was referred to the clinic for iron deficiency anemia.  The patient recently had a follow-up visit with her PCP on 03/14/22.  The patient was instructed to take iron pills due to her history of anemia.  She has reportedly been anemic since she started menstruating at the age of 48.  At her follow-up appointment, the patient requested to take IV iron infusions because the iron tablet caused nausea and forceful vomiting.  She also reports significant appetite loss with the iron supplements.  She states she is unable to tolerate any oral iron.  Her PCP repeated lab work that showed persistent microcytic anemia with a low hemoglobin at 9.4, low MCV at 78, elevated RDW of 15.5, low iron at 17, low saturation at 3%, low ferritin at 11, and elevated TIBC at 537.  The patient was referred to clinic for consideration of IV iron infusions.  Per chart review, the oldest records available to me are from 2008. She was anemic at that time with a Hbg of 10.8. She has had on and off anemia. The lowest Hbg I see is 5.2 in September 2019.  The patient had routine blood work performed at her PCPs office which revealed significant anemia and she was subsequently sent to the emergency room blood transfusions. The patient has never had an iron infusion..   The patient reports menorrhagia.  She states she has been on a estradiol patch since March 2023.  She still reports heavy cycles approximately every 4 weeks.  She reports on her heavy days she is using 6-8 ultra plus tampons.  She also uses pads.  She believes in August she had a miscarriage as  she had a cycle had an irregular interval with "tissue".   The patient has a history of PID and cervical dysplasia.  However, the patient denies any known history of fibroids or etiology leading to her heavy menstrual periods.  The patient reports fatigue, sluggishness, cold intolerance, lightheadedness, irritation, and lack of focus associated with her anemia.  She reports a well-balanced diet and is not a vegan or vegetarian.  She does eat red meats and foods rich in iron.  She believes she may have had a colonoscopy in 2008.  The records are not available to me.  The patient reports possible internal hemorrhoids but denies any bleeding.  She denies any epistaxis, gingival bleeding, hemoptysis, hematemesis, hematochezia, melena, or hematuria.  She is not on any blood thinners.  The patient has a family history of sickle cell trait.  The patient reports her mother side the family has cancer, depression, insomnia.  The patient's father has gout, hypertension, and coronary artery disease.  The patient is presently in the process of obtaining disability.  She is single with 4 children.  She has smoked 3 cigarettes/day since the age of 78 but she is working on cutting back to 1 cigarette/day.  She estimates she drinks 4-6 alcoholic beverages per week.  Reports marijuana use.  HPI  Past Medical History:  Diagnosis Date   Anemia    Arthritis    Bronchitis    Depression    no meds, fine right now  GERD (gastroesophageal reflux disease)    Heart murmur    Hypertension    "havent been taking my medicine in awhile"   Insomnia    Insomnia    Migraines    Pancreatitis    Perimenopausal    PID (acute pelvic inflammatory disease)    Pregnancy induced hypertension    Preterm labor    PTSD (post-traumatic stress disorder)    Seasonal allergies     Past Surgical History:  Procedure Laterality Date   ANTERIOR CERVICAL DECOMP/DISCECTOMY FUSION N/A 10/25/2021   Procedure: Anterior Cervical  Decompression Fusion - Cervical five-Cervical six - Cervical six-Cervical seven - Cervical seven-Thoracic one;  Surgeon: Julio Sicks, MD;  Location: [facility] OR;  Service: Neurosurgery;  Laterality: N/A;   CESAREAN SECTION     CHOLECYSTECTOMY N/A 08/27/2013   Procedure: LAPAROSCOPIC CHOLECYSTECTOMY WITH INTRAOPERATIVE CHOLANGIOGRAM;  Surgeon: Valarie Merino, MD;  Location: [facility];  Service: General;  Laterality: N/A;   DILATION AND CURETTAGE OF UTERUS     OVARY SURGERY     TUBAL LIGATION      Family History  Problem Relation Age of Onset   Hypertension Father    Liver disease Father    Stroke Father    Cancer Mother        colon   Arthritis Mother    Diabetes Paternal Grandmother    Hypertension Paternal Grandmother    Heart disease Paternal Grandmother     Social History Social History   Tobacco Use   Smoking status: Light Smoker    Packs/day: 0.25    Years: 12.00    Total pack years: 3.00    Types: Cigarettes   Smokeless tobacco: Never  Vaping Use   Vaping Use: Never used  Substance Use Topics   Alcohol use: Yes    Comment: occ   Drug use: Yes    Types: Marijuana    Comment: 4-5 times a week    Allergies  Allergen Reactions   Aspirin Anaphylaxis   Hydromorphone Hcl Anaphylaxis   Aloe Vera Hives and Itching   Dust Mite Extract     Seasonal allergies    Morphine Hives   Shrimp [Shellfish Allergy] Swelling    Current Outpatient Medications  Medication Sig Dispense Refill   albuterol (PROVENTIL) (2.5 MG/3ML) 0.083% nebulizer solution Take 2.5 mg by nebulization every 6 (six) hours as needed for wheezing or shortness of breath.     amLODipine (NORVASC) 10 MG tablet TAKE 1 TABLET BY MOUTH EVERY DAY 30 tablet 0   estradiol (CLIMARA - DOSED IN MG/24 HR) 0.025 mg/24hr patch Place 1 patch (0.025 mg total) onto the skin once a week. 12 patch 5   methocarbamol (ROBAXIN) 750 MG tablet Take 1 tablet (750 mg total) by mouth every 6 (six) hours as needed for muscle spasms. 30  tablet 0   metoprolol succinate (TOPROL-XL) 25 MG 24 hr tablet Take 1 tablet (25 mg total) by mouth daily. 90 tablet 3   nystatin ointment (MYCOSTATIN) Apply 1 application  topically daily as needed (rash).     pantoprazole (PROTONIX) 40 MG tablet Take 1 tablet (40 mg total) by mouth daily. 90 tablet 1   ferrous sulfate 325 (65 FE) MG tablet Take 1 tablet (325 mg total) by mouth daily. 30 tablet 3   HYDROcodone-acetaminophen (NORCO/VICODIN) 5-325 MG tablet Take 1 tablet by mouth every 4 (four) hours as needed for moderate pain ((score 4 to 6)). 30 tablet 0   hydrOXYzine (ATARAX) 10 MG tablet  Take 1 tablet (10 mg total) by mouth 3 (three) times daily as needed. 30 tablet 0   No current facility-administered medications for this visit.    REVIEW OF SYSTEMS:   Review of Systems  Constitutional: Positive for fatigue.  Negative for appetite change, chills, fever and unexpected weight change.  HENT: Negative for mouth sores, nosebleeds, sore throat and trouble swallowing.   Eyes: Negative for eye problems and icterus.  Respiratory: Negative for cough, hemoptysis, shortness of breath and wheezing.   Cardiovascular: Negative for chest pain and leg swelling.  Gastrointestinal: Negative for abdominal pain, constipation, diarrhea, nausea and vomiting.  Genitourinary: Negative for bladder incontinence, difficulty urinating, dysuria, frequency and hematuria.   Musculoskeletal: Negative for back pain, gait problem, neck pain and neck stiffness.  Skin: Negative for itching and rash.  Neurological: Negative for dizziness, extremity weakness, gait problem, headaches, light-headedness and seizures.  Hematological: Negative for adenopathy. Does not bruise/bleed easily.  Psychiatric/Behavioral: Negative for confusion, depression and sleep disturbance. The patient is not nervous/anxious.     PHYSICAL EXAMINATION:  Blood pressure (!) 138/91, pulse 82, temperature 99 F (37.2 C), temperature source Oral, resp.  rate 17, weight 141 lb 3 oz (64 kg), SpO2 100 %.  ECOG PERFORMANCE STATUS: 1  Physical Exam  Constitutional: Oriented to person, place, and time and well-developed, well-nourished, and in no distress.  HENT:  Head: Normocephalic and atraumatic.  Mouth/Throat: Oropharynx is clear and moist. No oropharyngeal exudate.  Eyes: Conjunctivae are normal. Right eye exhibits no discharge. Left eye exhibits no discharge. No scleral icterus.  Neck: Normal range of motion. Neck supple.  Cardiovascular: Normal rate, regular rhythm, normal heart sounds and intact distal pulses.   Pulmonary/Chest: Effort normal and breath sounds normal. No respiratory distress. No wheezes. No rales.  Abdominal: Soft. Bowel sounds are normal. Exhibits no distension and no mass. There is no tenderness.  Musculoskeletal: Normal range of motion. Exhibits no edema.  Lymphadenopathy:    No cervical adenopathy.  Neurological: Alert and oriented to person, place, and time. Exhibits normal muscle tone. Gait normal. Coordination normal.  Skin: Skin is warm and dry. No rash noted. Not diaphoretic. No erythema. No pallor.  Psychiatric: Mood, memory and judgment normal.  Vitals reviewed.  LABORATORY DATA: Lab Results  Component Value Date   WBC 8.5 03/31/2022   HGB 10.2 (L) 03/31/2022   HCT 32.8 (L) 03/31/2022   MCV 75.4 (L) 03/31/2022   PLT 597 (H) 03/31/2022      Chemistry      Component Value Date/Time   NA 139 03/31/2022 1321   NA 138 03/14/2022 1424   K 3.7 03/31/2022 1321   CL 103 03/31/2022 1321   CO2 29 03/31/2022 1321   BUN 7 03/31/2022 1321   BUN 15 03/14/2022 1424   CREATININE 0.70 03/31/2022 1321   CREATININE 0.66 03/03/2014 1105      Component Value Date/Time   CALCIUM 9.4 03/31/2022 1321   ALKPHOS 93 03/31/2022 1321   AST 26 03/31/2022 1321   ALT 14 03/31/2022 1321   BILITOT 0.6 03/31/2022 1321       RADIOGRAPHIC STUDIES: No results found.  ASSESSMENT: This is a very pleasant 41 year old  African-American female referred to the clinic for iron deficiency anemia.  The patient also has sickle cell trait.  The patient does not tolerate oral iron supplements well due to GI upset and nausea.  The patient had several lab studies performed today including a CBC, CMP, iron studies, ferritin, SPEP with immunofixation,  vitamin N39, and folic acid.  The patient's labs from today continues to demonstrate microcytic anemia with a hemoglobin of 10.2 and a low MCV of 75.4.  Her CMP is unremarkable except for an elevated total protein at 8.8.  We will add on an SPEP with immunofixation for her next lab visit.  Her other lab studies are pending at this time.   The patient was displeased that she cannot receive an iron infusion today.  I explained to her that IV iron needs to be authorized by her insurance before it can be administered.  We will arrange for the patient to receive IV iron infusions with Venofer 300 mg weekly x3.  We expect for her to receive her first dose of treatment next week.  We will arrange for this to be performed at the [facility] infusion center. Patient was given a handout of iron rich food and she was strongly encouraged to increase her dietary intake of iron as she is unable to tolerate iron supplements by mouth.  We will see her back for follow-up visit in 2 months for evaluation and repeat CBC, iron studies, SPEP, and ferritin.  In the meantime, I strongly encouraged her to make a follow-up appointment with her GYN to evaluate her menorrhagia as that is likely the underlying cause of her anemia.  The patient voices understanding of current disease status and treatment options and is in agreement with the current care plan.  All questions were answered. The patient knows to call the clinic with any problems, questions or concerns. We can certainly see the patient much sooner if necessary.  Thank you so much for allowing me to participate in the care of Kayla Dunn.  I will continue to follow up the patient with you and assist in her care.   Disclaimer: This note was dictated with voice recognition software. Similar sounding words can inadvertently be transcribed and may not be corrected upon review.   Lissandra Keil L Asheley Hellberg March 31, 2022, 2:37 PM  ADDENDUM: Hematology/Oncology Attending: I had a face-to-face encounter with the patient today.  I reviewed her records, lab and recommended her care plan.  This is a 41 years old African-American female with past medical history significant for sickle cell trait, GERD, depression, pancreatitis, PTSD, pelvic inflammatory disease, hypertension, migraine and heart murmur.  The patient was seen by her primary care provider last months for routine evaluation and she was found to have persistent anemia.  She was advised to take oral iron tablet but she has intolerance to the oral iron tablets and also no improvement in her condition.  She was referred to [place] today for further evaluation and recommendation of iron infusion. We ordered several studies today including repeat CBC that showed hemoglobin of 10.2 and hematocrit 32.8% with MCV of 75.4.  She has normal total white blood count and platelets count.  The iron study showed low serum iron of 21 with iron saturation of 3% and TIBC of 655.  She has no normal vitamin B12 level and normal serum folate. I recommended for the patient to proceed with iron infusion with Venofer 300 Mg IV weekly for 3 weeks. We will see the patient back for follow-up visit in 2 months for evaluation and repeat blood work. The patient was advised to call immediately if she has any other concerning complaints. The total time spent in the appointment was 60 minutes. Disclaimer: This note was dictated with voice recognition software. Similar sounding words can inadvertently  be transcribed and may be missed upon review. Eilleen Kempf, MD

## 2022-03-31 ENCOUNTER — Other Ambulatory Visit: Payer: Self-pay | Admitting: Medical Oncology

## 2022-03-31 ENCOUNTER — Encounter: Payer: Self-pay | Admitting: Physician Assistant

## 2022-03-31 ENCOUNTER — Inpatient Hospital Stay: Payer: Medicaid Other

## 2022-03-31 ENCOUNTER — Inpatient Hospital Stay: Payer: Medicaid Other | Attending: Physician Assistant | Admitting: Physician Assistant

## 2022-03-31 ENCOUNTER — Other Ambulatory Visit: Payer: Self-pay | Admitting: Physician Assistant

## 2022-03-31 VITALS — BP 138/91 | HR 82 | Temp 99.0°F | Resp 17 | Wt 141.2 lb

## 2022-03-31 DIAGNOSIS — D649 Anemia, unspecified: Secondary | ICD-10-CM | POA: Diagnosis present

## 2022-03-31 DIAGNOSIS — D509 Iron deficiency anemia, unspecified: Secondary | ICD-10-CM

## 2022-03-31 DIAGNOSIS — D508 Other iron deficiency anemias: Secondary | ICD-10-CM | POA: Diagnosis not present

## 2022-03-31 LAB — CBC WITH DIFFERENTIAL (CANCER CENTER ONLY)
Abs Immature Granulocytes: 0.02 10*3/uL (ref 0.00–0.07)
Basophils Absolute: 0.1 10*3/uL (ref 0.0–0.1)
Basophils Relative: 1 %
Eosinophils Absolute: 0.1 10*3/uL (ref 0.0–0.5)
Eosinophils Relative: 1 %
HCT: 32.8 % — ABNORMAL LOW (ref 36.0–46.0)
Hemoglobin: 10.2 g/dL — ABNORMAL LOW (ref 12.0–15.0)
Immature Granulocytes: 0 %
Lymphocytes Relative: 19 %
Lymphs Abs: 1.6 10*3/uL (ref 0.7–4.0)
MCH: 23.4 pg — ABNORMAL LOW (ref 26.0–34.0)
MCHC: 31.1 g/dL (ref 30.0–36.0)
MCV: 75.4 fL — ABNORMAL LOW (ref 80.0–100.0)
Monocytes Absolute: 0.9 10*3/uL (ref 0.1–1.0)
Monocytes Relative: 11 %
Neutro Abs: 5.8 10*3/uL (ref 1.7–7.7)
Neutrophils Relative %: 68 %
Platelet Count: 597 10*3/uL — ABNORMAL HIGH (ref 150–400)
RBC: 4.35 MIL/uL (ref 3.87–5.11)
RDW: 17.7 % — ABNORMAL HIGH (ref 11.5–15.5)
WBC Count: 8.5 10*3/uL (ref 4.0–10.5)
nRBC: 0 % (ref 0.0–0.2)

## 2022-03-31 LAB — CMP (CANCER CENTER ONLY)
ALT: 14 U/L (ref 0–44)
AST: 26 U/L (ref 15–41)
Albumin: 4.8 g/dL (ref 3.5–5.0)
Alkaline Phosphatase: 93 U/L (ref 38–126)
Anion gap: 7 (ref 5–15)
BUN: 7 mg/dL (ref 6–20)
CO2: 29 mmol/L (ref 22–32)
Calcium: 9.4 mg/dL (ref 8.9–10.3)
Chloride: 103 mmol/L (ref 98–111)
Creatinine: 0.7 mg/dL (ref 0.44–1.00)
GFR, Estimated: >60 mL/min (ref >60)
Glucose, Bld: 97 mg/dL (ref 70–99)
Potassium: 3.7 mmol/L (ref 3.5–5.1)
Sodium: 139 mmol/L (ref 135–145)
Total Bilirubin: 0.6 mg/dL (ref 0.3–1.2)
Total Protein: 8.8 g/dL — ABNORMAL HIGH (ref 6.5–8.1)

## 2022-03-31 LAB — IRON AND IRON BINDING CAPACITY (CC-WL,HP ONLY)
Iron: 21 ug/dL — ABNORMAL LOW (ref 28–170)
Saturation Ratios: 3 % — ABNORMAL LOW (ref 10.4–31.8)
TIBC: 655 ug/dL — ABNORMAL HIGH (ref 250–450)
UIBC: 634 ug/dL — ABNORMAL HIGH (ref 148–442)

## 2022-03-31 LAB — VITAMIN B12: Vitamin B-12: 185 pg/mL (ref 180–914)

## 2022-03-31 LAB — SAMPLE TO BLOOD BANK

## 2022-03-31 LAB — FERRITIN: Ferritin: 6 ng/mL — ABNORMAL LOW (ref 11–307)

## 2022-03-31 LAB — FOLATE: Folate: 8.8 ng/mL (ref >5.9)

## 2022-04-01 ENCOUNTER — Encounter: Payer: Self-pay | Admitting: Physician Assistant

## 2022-04-05 ENCOUNTER — Other Ambulatory Visit: Payer: Self-pay | Admitting: Nurse Practitioner

## 2022-04-05 DIAGNOSIS — I10 Essential (primary) hypertension: Secondary | ICD-10-CM

## 2022-04-06 ENCOUNTER — Other Ambulatory Visit: Payer: Self-pay | Admitting: Pharmacy Technician

## 2022-04-06 ENCOUNTER — Telehealth: Payer: Self-pay | Admitting: Pharmacy Technician

## 2022-04-06 NOTE — Telephone Encounter (Signed)
Dr. Toya Smothers, [place] note:  Auth Submission: NO AUTH NEEDED Payer: [place] Medication & CPT/J Code(s) submitted: Venofer (Iron Sucrose) J1756 Route of submission (phone, fax, portal):  Phone # Fax # Auth type: Buy/Bill Units/visits requested: X3 DOSES Reference number: [id number] REF Approval from: 04/06/22 to 06/19/22   Patient will be scheduled as soon as possible

## 2022-04-11 ENCOUNTER — Ambulatory Visit (INDEPENDENT_AMBULATORY_CARE_PROVIDER_SITE_OTHER): Payer: Medicaid Other

## 2022-04-11 VITALS — BP 130/83 | HR 62 | Temp 97.7°F | Resp 18 | Ht 61.5 in | Wt 145.8 lb

## 2022-04-11 DIAGNOSIS — D508 Other iron deficiency anemias: Secondary | ICD-10-CM

## 2022-04-11 MED ORDER — ACETAMINOPHEN 325 MG PO TABS
650.0000 mg | ORAL_TABLET | Freq: Once | ORAL | Status: AC
  Administered 2022-04-11: 650 mg via ORAL
  Filled 2022-04-11: qty 2

## 2022-04-11 MED ORDER — SODIUM CHLORIDE 0.9 % IV SOLN
300.0000 mg | Freq: Once | INTRAVENOUS | Status: AC
  Administered 2022-04-11: 300 mg via INTRAVENOUS
  Filled 2022-04-11: qty 15

## 2022-04-11 MED ORDER — DIPHENHYDRAMINE HCL 25 MG PO CAPS
25.0000 mg | ORAL_CAPSULE | Freq: Once | ORAL | Status: AC
  Administered 2022-04-11: 25 mg via ORAL
  Filled 2022-04-11: qty 1

## 2022-04-11 NOTE — Progress Notes (Signed)
Diagnosis: Iron Deficiency Anemia  Provider:  Marshell Garfinkel MD  Procedure: Infusion  IV Type: Peripheral, IV Location: R Antecubital  Venofer (Iron Sucrose), Dose: 300 mg  Infusion Start Time: 6160  Infusion Stop Time: 1525  Post Infusion IV Care: Observation period completed and Peripheral IV Discontinued  Discharge: Condition: Good, Destination: Home . AVS provided to patient.   Performed by:  Arnoldo Morale, RN

## 2022-04-14 ENCOUNTER — Ambulatory Visit (INDEPENDENT_AMBULATORY_CARE_PROVIDER_SITE_OTHER): Payer: Medicaid Other | Admitting: Nurse Practitioner

## 2022-04-14 ENCOUNTER — Encounter: Payer: Self-pay | Admitting: Nurse Practitioner

## 2022-04-14 VITALS — BP 128/80 | HR 85 | Temp 98.6°F | Ht 61.5 in | Wt 144.2 lb

## 2022-04-14 DIAGNOSIS — D508 Other iron deficiency anemias: Secondary | ICD-10-CM | POA: Diagnosis not present

## 2022-04-14 DIAGNOSIS — Z23 Encounter for immunization: Secondary | ICD-10-CM | POA: Insufficient documentation

## 2022-04-14 DIAGNOSIS — N921 Excessive and frequent menstruation with irregular cycle: Secondary | ICD-10-CM

## 2022-04-14 NOTE — Patient Instructions (Signed)
1. Need for Tdap vaccination  - Tdap vaccine greater than or equal to 40yo IM  2. Menorrhagia with irregular cycle  - Ambulatory referral to Obstetrics / Gynecology  3. Other iron deficiency anemia  - Ambulatory referral to Obstetrics / Gynecology   Follow up:  Follow up in 6 months or sooner if needed

## 2022-04-14 NOTE — Progress Notes (Signed)
@Patient  ID: , female    DOB: Jun 06, 1981, 41 y.o.   MRN: [id number]  Chief Complaint  Patient presents with   Follow-up    Iron with referral to obgyn    Referring provider: [id number], NP   HPI  Kayla Dunn 41 y.o. female  has a past medical history of Anemia, Arthritis, Bronchitis, Depression, GERD (gastroesophageal reflux disease), Heart murmur, Hypertension, Insomnia, Insomnia, Migraines, Pancreatitis, Perimenopausal, PID (acute pelvic inflammatory disease), Pregnancy induced hypertension, Preterm labor, PTSD (post-traumatic stress disorder), and Seasonal allergies.   Patient presents today for follow-up visit.  She was noted to be anemic at her last visit and was sent to hematology for iron infusion.  She has been evaluated by them and has had infusion and does have a follow-up set up.  Patient does have a history of having irregular menstrual cycles.  We will refer her to OB/GYN for further evaluation and treatment. Denies f/c/s, n/v/d, hemoptysis, PND, leg swelling Denies chest pain or edema    Allergies  Allergen Reactions   Aspirin Anaphylaxis   Hydromorphone Hcl Anaphylaxis   Aloe Vera Hives and Itching   Dust Mite Extract     Seasonal allergies    Morphine Hives   Shrimp [Shellfish Allergy] Swelling    Immunization History  Administered Date(s) Administered   Influenza Whole 04/28/2010   Influenza,inj,Quad PF,6+ Mos 03/03/2014, 05/14/2018   Td 07/21/1997    Past Medical History:  Diagnosis Date   Anemia    Arthritis    Bronchitis    Depression    no meds, fine right now   GERD (gastroesophageal reflux disease)    Heart murmur    Hypertension    "havent been taking my medicine in awhile"   Insomnia    Insomnia    Migraines    Pancreatitis    Perimenopausal    PID (acute pelvic inflammatory disease)    Pregnancy induced hypertension    Preterm labor    PTSD (post-traumatic stress disorder)    Seasonal allergies      Tobacco History: Social History   Tobacco Use  Smoking Status Light Smoker   Packs/day: 0.25   Years: 12.00   Total pack years: 3.00   Types: Cigarettes  Smokeless Tobacco Never   Ready to quit: Not Answered Counseling given: Not Answered   Outpatient Encounter Medications as of 04/14/2022  Medication Sig   albuterol (PROVENTIL) (2.5 MG/3ML) 0.083% nebulizer solution Take 2.5 mg by nebulization every 6 (six) hours as needed for wheezing or shortness of breath.   amLODipine (NORVASC) 10 MG tablet TAKE 1 TABLET BY MOUTH EVERY DAY   estradiol (CLIMARA - DOSED IN MG/24 HR) 0.025 mg/24hr patch Place 1 patch (0.025 mg total) onto the skin once a week.   methocarbamol (ROBAXIN) 750 MG tablet Take 1 tablet (750 mg total) by mouth every 6 (six) hours as needed for muscle spasms.   metoprolol succinate (TOPROL-XL) 25 MG 24 hr tablet Take 1 tablet (25 mg total) by mouth daily.   nystatin ointment (MYCOSTATIN) Apply 1 application  topically daily as needed (rash).   pantoprazole (PROTONIX) 40 MG tablet Take 1 tablet (40 mg total) by mouth daily.   ferrous sulfate 325 (65 FE) MG tablet Take 1 tablet (325 mg total) by mouth daily.   HYDROcodone-acetaminophen (NORCO/VICODIN) 5-325 MG tablet Take 1 tablet by mouth every 4 (four) hours as needed for moderate pain ((score 4 to 6)).   hydrOXYzine (ATARAX) 10 MG  tablet Take 1 tablet (10 mg total) by mouth 3 (three) times daily as needed.   No facility-administered encounter medications on file as of 04/14/2022.     Review of Systems  Review of Systems  Constitutional: Negative.   HENT: Negative.    Cardiovascular: Negative.   Gastrointestinal: Negative.   Allergic/Immunologic: Negative.   Neurological: Negative.   Psychiatric/Behavioral: Negative.         Physical Exam  BP 128/80   Pulse 85   Temp 98.6 F (37 C)   Ht 5' 1.5" (1.562 m)   Wt 144 lb 3.2 oz (65.4 kg)   LMP 03/28/2022   SpO2 99%   BMI 26.81 kg/m   Wt Readings  from Last 5 Encounters:  04/14/22 144 lb 3.2 oz (65.4 kg)  04/11/22 145 lb 12.8 oz (66.1 kg)  03/31/22 141 lb 3 oz (64 kg)  03/14/22 140 lb (63.5 kg)  12/15/21 144 lb 6.4 oz (65.5 kg)     Physical Exam Vitals and nursing note reviewed.  Constitutional:      General: She is not in acute distress.    Appearance: She is well-developed.  Cardiovascular:     Rate and Rhythm: Normal rate and regular rhythm.  Pulmonary:     Effort: Pulmonary effort is normal.     Breath sounds: Normal breath sounds.  Neurological:     Mental Status: She is alert and oriented to person, place, and time.      Lab Results:  CBC    Component Value Date/Time   WBC 8.5 03/31/2022 1321   WBC 7.7 10/20/2021 1055   RBC 4.35 03/31/2022 1321   HGB 10.2 (L) 03/31/2022 1321   HGB 9.4 (L) 03/14/2022 1424   HCT 32.8 (L) 03/31/2022 1321   HCT 32.5 (L) 03/14/2022 1424   PLT 597 (H) 03/31/2022 1321   PLT 443 03/14/2022 1424   MCV 75.4 (L) 03/31/2022 1321   MCV 78 (L) 03/14/2022 1424   MCH 23.4 (L) 03/31/2022 1321   MCHC 31.1 03/31/2022 1321   RDW 17.7 (H) 03/31/2022 1321   RDW 15.5 (H) 03/14/2022 1424   LYMPHSABS 1.6 03/31/2022 1321   LYMPHSABS 2.3 12/15/2021 1554   MONOABS 0.9 03/31/2022 1321   EOSABS 0.1 03/31/2022 1321   EOSABS 0.1 12/15/2021 1554   BASOSABS 0.1 03/31/2022 1321   BASOSABS 0.1 12/15/2021 1554    BMET    Component Value Date/Time   NA 139 03/31/2022 1321   NA 138 03/14/2022 1424   K 3.7 03/31/2022 1321   CL 103 03/31/2022 1321   CO2 29 03/31/2022 1321   GLUCOSE 97 03/31/2022 1321   BUN 7 03/31/2022 1321   BUN 15 03/14/2022 1424   CREATININE 0.70 03/31/2022 1321   CREATININE 0.66 03/03/2014 1105   CALCIUM 9.4 03/31/2022 1321   GFRNONAA >60 03/31/2022 1321   GFRAA >60 03/10/2018 1217      Assessment & Plan:   Need for Tdap vaccination - Tdap vaccine greater than or equal to 7yo IM  2. Menorrhagia with irregular cycle  - Ambulatory referral to Obstetrics /  Gynecology  3. Other iron deficiency anemia  - Ambulatory referral to Obstetrics / Gynecology   Follow up:  Follow up in 6 months or sooner if needed     Fenton Foy, NP 04/14/2022

## 2022-04-14 NOTE — Assessment & Plan Note (Signed)
-   Tdap vaccine greater than or equal to 40yo IM  2. Menorrhagia with irregular cycle  - Ambulatory referral to Obstetrics / Gynecology  3. Other iron deficiency anemia  - Ambulatory referral to Obstetrics / Gynecology   Follow up:  Follow up in 6 months or sooner if needed

## 2022-04-18 ENCOUNTER — Ambulatory Visit (INDEPENDENT_AMBULATORY_CARE_PROVIDER_SITE_OTHER): Payer: Medicaid Other

## 2022-04-18 ENCOUNTER — Other Ambulatory Visit: Payer: Self-pay

## 2022-04-18 VITALS — BP 115/75 | HR 94 | Temp 98.8°F | Resp 18 | Ht 61.0 in | Wt 144.2 lb

## 2022-04-18 DIAGNOSIS — I10 Essential (primary) hypertension: Secondary | ICD-10-CM

## 2022-04-18 DIAGNOSIS — D508 Other iron deficiency anemias: Secondary | ICD-10-CM | POA: Diagnosis not present

## 2022-04-18 MED ORDER — AMLODIPINE BESYLATE 10 MG PO TABS: 10.0000 mg | ORAL_TABLET | Freq: Every day | ORAL | 2 refills | Status: DC

## 2022-04-18 MED ORDER — DIPHENHYDRAMINE HCL 25 MG PO CAPS: 25.0000 mg | ORAL_CAPSULE | Freq: Once | ORAL | Status: DC

## 2022-04-18 MED ORDER — ACETAMINOPHEN 325 MG PO TABS
650.0000 mg | ORAL_TABLET | Freq: Once | ORAL | Status: AC
  Administered 2022-04-18: 650 mg via ORAL
  Filled 2022-04-18: qty 2

## 2022-04-18 MED ORDER — SODIUM CHLORIDE 0.9 % IV SOLN
300.0000 mg | Freq: Once | INTRAVENOUS | Status: AC
  Administered 2022-04-18: 300 mg via INTRAVENOUS
  Filled 2022-04-18: qty 15

## 2022-04-18 NOTE — Progress Notes (Signed)
Diagnosis: Iron Deficiency Anemia  Provider:  Marshell Garfinkel MD  Procedure: Infusion  IV Type: Peripheral, IV Location: R Antecubital  Venofer (Iron Sucrose), Dose: 300 mg  Infusion Start Time: 2446  Infusion Stop Time: 9507  Post Infusion IV Care: Peripheral IV Discontinued  Discharge: Condition: Good, Destination: Home . AVS provided to patient.   Performed by:  Arnoldo Morale, RN

## 2022-04-20 ENCOUNTER — Other Ambulatory Visit: Payer: Self-pay | Admitting: Nurse Practitioner

## 2022-04-20 DIAGNOSIS — M2559 Pain in other specified joint: Secondary | ICD-10-CM

## 2022-04-20 MED ORDER — METHOCARBAMOL 750 MG PO TABS: 750.0000 mg | ORAL_TABLET | Freq: Four times a day (QID) | ORAL | 0 refills | Status: DC | PRN

## 2022-04-21 DIAGNOSIS — M4310 Spondylolisthesis, site unspecified: Secondary | ICD-10-CM | POA: Diagnosis not present

## 2022-04-25 ENCOUNTER — Ambulatory Visit (INDEPENDENT_AMBULATORY_CARE_PROVIDER_SITE_OTHER): Payer: Medicaid Other

## 2022-04-25 VITALS — BP 133/79 | HR 68 | Temp 98.3°F | Resp 16 | Ht 61.5 in | Wt 140.4 lb

## 2022-04-25 DIAGNOSIS — D508 Other iron deficiency anemias: Secondary | ICD-10-CM | POA: Diagnosis not present

## 2022-04-25 MED ORDER — DIPHENHYDRAMINE HCL 25 MG PO CAPS: 25.0000 mg | ORAL_CAPSULE | Freq: Once | ORAL | Status: DC

## 2022-04-25 MED ORDER — SODIUM CHLORIDE 0.9 % IV SOLN
300.0000 mg | Freq: Once | INTRAVENOUS | Status: AC
  Administered 2022-04-25: 300 mg via INTRAVENOUS
  Filled 2022-04-25: qty 15

## 2022-04-25 MED ORDER — ACETAMINOPHEN 325 MG PO TABS: 650.0000 mg | ORAL_TABLET | Freq: Once | ORAL | Status: DC

## 2022-04-25 NOTE — Progress Notes (Signed)
Diagnosis: Iron Deficiency Anemia  Provider:  Marshell Garfinkel MD  Procedure: Infusion  IV Type: Peripheral, IV Location: R Hand  Venofer (Iron Sucrose), Dose: 300 mg  Infusion Start Time: 7253  Infusion Stop Time: 6644  Post Infusion IV Care: Peripheral IV Discontinued  Discharge: Condition: Good, Destination: Home . AVS provided to patient.   Performed by:  Arnoldo Morale, RN

## 2022-05-27 ENCOUNTER — Encounter (HOSPITAL_BASED_OUTPATIENT_CLINIC_OR_DEPARTMENT_OTHER): Payer: Medicaid Other | Admitting: Medical

## 2022-05-27 ENCOUNTER — Encounter (HOSPITAL_BASED_OUTPATIENT_CLINIC_OR_DEPARTMENT_OTHER): Payer: Self-pay

## 2022-06-02 ENCOUNTER — Inpatient Hospital Stay: Payer: Medicaid Other | Attending: Physician Assistant

## 2022-06-02 ENCOUNTER — Inpatient Hospital Stay (HOSPITAL_BASED_OUTPATIENT_CLINIC_OR_DEPARTMENT_OTHER): Payer: Medicaid Other | Admitting: Internal Medicine

## 2022-06-02 VITALS — BP 146/110 | HR 104 | Temp 98.6°F | Resp 17 | Ht 61.5 in | Wt 143.2 lb

## 2022-06-02 DIAGNOSIS — D508 Other iron deficiency anemias: Secondary | ICD-10-CM | POA: Diagnosis not present

## 2022-06-02 DIAGNOSIS — R531 Weakness: Secondary | ICD-10-CM | POA: Diagnosis not present

## 2022-06-02 DIAGNOSIS — D509 Iron deficiency anemia, unspecified: Secondary | ICD-10-CM | POA: Insufficient documentation

## 2022-06-02 DIAGNOSIS — R5383 Other fatigue: Secondary | ICD-10-CM | POA: Insufficient documentation

## 2022-06-02 LAB — CBC WITH DIFFERENTIAL (CANCER CENTER ONLY)
Abs Immature Granulocytes: 0.01 10*3/uL (ref 0.00–0.07)
Basophils Absolute: 0 10*3/uL (ref 0.0–0.1)
Basophils Relative: 1 %
Eosinophils Absolute: 0.1 10*3/uL (ref 0.0–0.5)
Eosinophils Relative: 2 %
HCT: 40.2 % (ref 36.0–46.0)
Hemoglobin: 13.1 g/dL (ref 12.0–15.0)
Immature Granulocytes: 0 %
Lymphocytes Relative: 28 %
Lymphs Abs: 1.6 10*3/uL (ref 0.7–4.0)
MCH: 28.3 pg (ref 26.0–34.0)
MCHC: 32.6 g/dL (ref 30.0–36.0)
MCV: 86.8 fL (ref 80.0–100.0)
Monocytes Absolute: 0.6 10*3/uL (ref 0.1–1.0)
Monocytes Relative: 10 %
Neutro Abs: 3.3 10*3/uL (ref 1.7–7.7)
Neutrophils Relative %: 59 %
Platelet Count: 472 10*3/uL — ABNORMAL HIGH (ref 150–400)
RBC: 4.63 MIL/uL (ref 3.87–5.11)
WBC Count: 5.6 10*3/uL (ref 4.0–10.5)
nRBC: 0 % (ref 0.0–0.2)

## 2022-06-02 LAB — IRON AND IRON BINDING CAPACITY (CC-WL,HP ONLY)
Iron: 82 ug/dL (ref 28–170)
Saturation Ratios: 18 % (ref 10.4–31.8)
TIBC: 455 ug/dL — ABNORMAL HIGH (ref 250–450)
UIBC: 373 ug/dL (ref 148–442)

## 2022-06-02 NOTE — Progress Notes (Signed)
[facility] Telephone:([phone]   [phone]  OFFICE PROGRESS NOTE  Ivonne Andrew, NP [address] [place] [address]  DIAGNOSIS: Iron deficiency anemia with intolerance to the oral iron tablets.  PRIOR THERAPY: Iron infusion with Venofer 300 Mg IV weekly for 3 weeks.  CURRENT THERAPY: None  INTERVAL HISTORY: Kayla Dunn 41 y.o. female returns to the clinic today for follow-up visit.  The patient is feeling fine today with no concerning complaints except for feeling cold.  She has improvement in her fatigue and weakness.  She denied having any dizzy spells.  She has no chest pain, shortness of breath, cough or hemoptysis.  She has no nausea, vomiting, diarrhea or constipation.  She is not currently on any supplements because she cannot tolerate it.  She received iron infusion with Venofer and tolerated it fairly well.  She is here for evaluation and repeat blood work.  MEDICAL HISTORY: Past Medical History:  Diagnosis Date   Anemia    Arthritis    Bronchitis    Depression    no meds, fine right now   GERD (gastroesophageal reflux disease)    Heart murmur    Hypertension    "havent been taking my medicine in awhile"   Insomnia    Insomnia    Migraines    Pancreatitis    Perimenopausal    PID (acute pelvic inflammatory disease)    Pregnancy induced hypertension    Preterm labor    PTSD (post-traumatic stress disorder)    Seasonal allergies     ALLERGIES:  is allergic to aspirin, hydromorphone hcl, aloe vera, dust mite extract, morphine, and shrimp [shellfish allergy].  MEDICATIONS:  Current Outpatient Medications  Medication Sig Dispense Refill   albuterol (PROVENTIL) (2.5 MG/3ML) 0.083% nebulizer solution Take 2.5 mg by nebulization every 6 (six) hours as needed for wheezing or shortness of breath.     amLODipine (NORVASC) 10 MG tablet Take 1 tablet (10 mg total) by mouth daily. 30 tablet 2   estradiol (CLIMARA -  DOSED IN MG/24 HR) 0.025 mg/24hr patch Place 1 patch (0.025 mg total) onto the skin once a week. 12 patch 5   ferrous sulfate 325 (65 FE) MG tablet Take 1 tablet (325 mg total) by mouth daily. 30 tablet 3   HYDROcodone-acetaminophen (NORCO/VICODIN) 5-325 MG tablet Take 1 tablet by mouth every 4 (four) hours as needed for moderate pain ((score 4 to 6)). 30 tablet 0   hydrOXYzine (ATARAX) 10 MG tablet Take 1 tablet (10 mg total) by mouth 3 (three) times daily as needed. 30 tablet 0   methocarbamol (ROBAXIN) 750 MG tablet Take 1 tablet (750 mg total) by mouth every 6 (six) hours as needed for muscle spasms. 30 tablet 0   metoprolol succinate (TOPROL-XL) 25 MG 24 hr tablet Take 1 tablet (25 mg total) by mouth daily. 90 tablet 3   nystatin ointment (MYCOSTATIN) Apply 1 application  topically daily as needed (rash).     pantoprazole (PROTONIX) 40 MG tablet Take 1 tablet (40 mg total) by mouth daily. 90 tablet 1   No current facility-administered medications for this visit.    SURGICAL HISTORY:  Past Surgical History:  Procedure Laterality Date   ANTERIOR CERVICAL DECOMP/DISCECTOMY FUSION N/A 10/25/2021   Procedure: Anterior Cervical Decompression Fusion - Cervical five-Cervical six - Cervical six-Cervical seven - Cervical seven-Thoracic one;  Surgeon: Julio Sicks, MD;  Location: [facility] OR;  Service: Neurosurgery;  Laterality: N/A;   CESAREAN SECTION     CHOLECYSTECTOMY N/A 08/27/2013   Procedure: LAPAROSCOPIC CHOLECYSTECTOMY WITH INTRAOPERATIVE CHOLANGIOGRAM;  Surgeon: Valarie Merino, MD;  Location: [facility] ORS;  Service: General;  Laterality: N/A;   DILATION AND CURETTAGE OF UTERUS     OVARY SURGERY     TUBAL LIGATION      REVIEW OF SYSTEMS:  A comprehensive review of systems was negative except for: Constitutional: positive for fatigue   PHYSICAL EXAMINATION: General appearance: alert, cooperative, fatigued, and no distress Head: Normocephalic, without obvious abnormality, atraumatic Neck: no  adenopathy, no JVD, supple, symmetrical, trachea midline, and thyroid not enlarged, symmetric, no tenderness/mass/nodules Lymph nodes: Cervical, supraclavicular, and axillary nodes normal. Resp: clear to auscultation bilaterally Back: symmetric, no curvature. ROM normal. No CVA tenderness. Cardio: regular rate and rhythm, S1, S2 normal, no murmur, click, rub or gallop GI: soft, non-tender; bowel sounds normal; no masses,  no organomegaly Extremities: extremities normal, atraumatic, no cyanosis or edema  ECOG PERFORMANCE STATUS: 1 - Symptomatic but completely ambulatory  Blood pressure (!) 146/110, pulse (!) 104, temperature 98.6 F (37 C), temperature source Oral, resp. rate 17, height 5' 1.5" (1.562 m), weight 143 lb 3.2 oz (65 kg), SpO2 98 %.  LABORATORY DATA: Lab Results  Component Value Date   WBC 5.6 06/02/2022   HGB 13.1 06/02/2022   HCT 40.2 06/02/2022   MCV 86.8 06/02/2022   PLT 472 (H) 06/02/2022      Chemistry      Component Value Date/Time   NA 139 03/31/2022 1321   NA 138 03/14/2022 1424   K 3.7 03/31/2022 1321   CL 103 03/31/2022 1321   CO2 29 03/31/2022 1321   BUN 7 03/31/2022 1321   BUN 15 03/14/2022 1424   CREATININE 0.70 03/31/2022 1321   CREATININE 0.66 03/03/2014 1105      Component Value Date/Time   CALCIUM 9.4 03/31/2022 1321   ALKPHOS 93 03/31/2022 1321   AST 26 03/31/2022 1321   ALT 14 03/31/2022 1321   BILITOT 0.6 03/31/2022 1321       RADIOGRAPHIC STUDIES: No results found.  ASSESSMENT AND PLAN: This is a very pleasant 41 years old African-American female with iron deficiency anemia secondary to menstrual blood loss with inadequate oral iron supplements because of intolerance. The patient was treated with iron infusion with Venofer 300 Mg IV weekly for 3 weeks and tolerated it fairly well.  She felt much better after the iron infusion. Repeat CBC today showed significant improvement in her hemoglobin and hematocrit.  Iron study and ferritin  are still pending. I recommended for the patient to continue on observation with repeat CBC, iron study and ferritin in 3 months. The patient was advised to call immediately if she has any other concerning symptoms in the interval. The patient voices understanding of current disease status and treatment options and is in agreement with the current care plan.  All questions were answered. The patient knows to call the clinic with any problems, questions or concerns. We can certainly see the patient much sooner if necessary.  The total time spent in the appointment was 20 minutes.  Disclaimer: This note was dictated with voice recognition software. Similar sounding words can inadvertently be transcribed and may not be corrected upon review.

## 2022-06-03 LAB — FERRITIN: Ferritin: 45 ng/mL (ref 11–307)

## 2022-06-06 LAB — PROTEIN ELECTROPHORESIS, SERUM, WITH REFLEX
A/G Ratio: 1.1 (ref 0.7–1.7)
Albumin ELP: 4 g/dL (ref 2.9–4.4)
Alpha-1-Globulin: 0.3 g/dL (ref 0.0–0.4)
Alpha-2-Globulin: 0.8 g/dL (ref 0.4–1.0)
Beta Globulin: 1.1 g/dL (ref 0.7–1.3)
Gamma Globulin: 1.3 g/dL (ref 0.4–1.8)
Globulin, Total: 3.5 g/dL (ref 2.2–3.9)
Total Protein ELP: 7.5 g/dL (ref 6.0–8.5)

## 2022-06-09 ENCOUNTER — Encounter (HOSPITAL_BASED_OUTPATIENT_CLINIC_OR_DEPARTMENT_OTHER): Payer: Self-pay | Admitting: Obstetrics & Gynecology

## 2022-06-09 ENCOUNTER — Ambulatory Visit (INDEPENDENT_AMBULATORY_CARE_PROVIDER_SITE_OTHER): Payer: Medicaid Other | Admitting: Obstetrics & Gynecology

## 2022-06-09 VITALS — BP 129/86 | HR 60 | Ht 60.0 in | Wt 145.6 lb

## 2022-06-09 DIAGNOSIS — D508 Other iron deficiency anemias: Secondary | ICD-10-CM

## 2022-06-09 DIAGNOSIS — N920 Excessive and frequent menstruation with regular cycle: Secondary | ICD-10-CM | POA: Diagnosis not present

## 2022-06-09 DIAGNOSIS — N951 Menopausal and female climacteric states: Secondary | ICD-10-CM

## 2022-06-09 MED ORDER — NORETHINDRONE 0.35 MG PO TABS: 1.0000 | ORAL_TABLET | Freq: Every day | ORAL | 3 refills | Status: DC

## 2022-06-09 NOTE — Progress Notes (Signed)
GYNECOLOGY  VISIT  CC:   h/o menorrhagia  HPI: 41 y.o. [id number] Significant Other Black or African American female here for new patient appointment.  Cycles are regular but cycles are typically about 5 days but have now been lasting up to 10 days.  Flow is very heavy during every days.  Uses ultra plus tampons with large pads and still leaks through with her bleeding.  Has been experiencing anemia with hb at low as 7.2.  has received 3 iron infusions and hemoglobin is now 13.1.  Has not had an ultrasound.  Did have normal pap with neg HR HPV 12/15/2021.  She has hx of cervical dysplasia during pregnancies but this seems to have resolved.  She is desirous of treatment and would be open to hysterectomy as well as other options.  Has used IUD in the past and not really interested in this option.  Endometrial ablation, progesterone only methods reviewed.  Pt has been on depo provera in the past and had significant weight gain so does decline this option as well.    Pt was told she was perimenopausal and placed on estrogen patch alone.  Is not on any progesterone.  She feels this has helped her symptoms.  Review of charge does not show any hormonal testing.  Reviewed with pt I do not think she is perimenopausal given her age and bleeding and unopposed estrogen therapy can increase bleeding/cancer risk.  Would recommend obtaining FSH today and possibly stopping estrogen patch.   Past Medical History:  Diagnosis Date   Anemia    Arthritis    Bronchitis    Depression    no meds, fine right now   GERD (gastroesophageal reflux disease)    Heart murmur    Hypertension    "havent been taking my medicine in awhile"   Insomnia    Insomnia    Migraines    Pancreatitis    Perimenopausal    PID (acute pelvic inflammatory disease)    Pregnancy induced hypertension    Preterm labor    PTSD (post-traumatic stress disorder)    Seasonal allergies     MEDS:   Current Outpatient Medications on File Prior to  Visit  Medication Sig Dispense Refill   albuterol (PROVENTIL) (2.5 MG/3ML) 0.083% nebulizer solution Take 2.5 mg by nebulization every 6 (six) hours as needed for wheezing or shortness of breath.     amLODipine (NORVASC) 10 MG tablet Take 1 tablet (10 mg total) by mouth daily. 30 tablet 2   estradiol (CLIMARA - DOSED IN MG/24 HR) 0.025 mg/24hr patch Place 1 patch (0.025 mg total) onto the skin once a week. 12 patch 5   methocarbamol (ROBAXIN) 750 MG tablet Take 1 tablet (750 mg total) by mouth every 6 (six) hours as needed for muscle spasms. 30 tablet 0   metoprolol succinate (TOPROL-XL) 25 MG 24 hr tablet Take 1 tablet (25 mg total) by mouth daily. 90 tablet 3   nystatin ointment (MYCOSTATIN) Apply 1 application  topically daily as needed (rash).     pantoprazole (PROTONIX) 40 MG tablet Take 1 tablet (40 mg total) by mouth daily. 90 tablet 1   No current facility-administered medications on file prior to visit.    ALLERGIES: Aspirin, Hydromorphone hcl, Aloe vera, Dust mite extract, Morphine, and Shrimp [shellfish allergy]  SH:    Review of Systems  Constitutional: Negative.   Genitourinary:        Bleeding issues    PHYSICAL EXAMINATION:  BP 129/86 (BP Location: Right Arm, Patient Position: Sitting, Cuff Size: Normal)   Pulse 60   Ht 5' (1.524 m) Comment: Reported  Wt 145 lb 9.6 oz (66 kg)   LMP 05/27/2022   BMI 28.44 kg/m     General appearance: alert, cooperative and appears stated age Abdomen: soft, non-tender; bowel sounds normal; no masses,  no organomegaly Lymph:  no inguinal LAD noted  Pelvic: External genitalia:  no lesions              Urethra:  normal appearing urethra with no masses, tenderness or lesions              Bartholins and Skenes: normal                 Vagina: normal appearing vagina with normal color and discharge, no lesions              Cervix: no lesions              Bimanual Exam:  Uterus:  enlarged, 8-10 weeks size, deviated to pt's left               Adnexa: no mass, fullness, tenderness              Chaperone, Ina Homes, CMA, was present for exam.  Assessment/Plan: 1. Menorrhagia with regular cycle - with deviation of uterus, feel fibroids could be present.  Will proceed with ultrasound.  I feel pt is likely going to need an biopsy as well. - US PELVIC COMPLETE WITH TRANSVAGINAL; Future - will start micronor today due to bleeding (and estrogen patch use as well)  2. Perimenopausal symptoms - do not feel she is menopausal and that she needs the progesterone added above - Follicle stimulating hormone  3. Other iron deficiency anemia - has been followed by hematology and receiving IV iron.  Hb is much better

## 2022-06-10 LAB — FOLLICLE STIMULATING HORMONE: FSH: 12.2 m[IU]/mL

## 2022-07-06 ENCOUNTER — Encounter (HOSPITAL_BASED_OUTPATIENT_CLINIC_OR_DEPARTMENT_OTHER): Payer: Self-pay | Admitting: Obstetrics & Gynecology

## 2022-07-06 ENCOUNTER — Ambulatory Visit (INDEPENDENT_AMBULATORY_CARE_PROVIDER_SITE_OTHER): Payer: Medicaid Other

## 2022-07-06 ENCOUNTER — Ambulatory Visit (HOSPITAL_BASED_OUTPATIENT_CLINIC_OR_DEPARTMENT_OTHER): Payer: Medicaid Other | Admitting: Obstetrics & Gynecology

## 2022-07-06 VITALS — BP 160/98 | HR 74 | Ht 61.0 in | Wt 145.4 lb

## 2022-07-06 DIAGNOSIS — R030 Elevated blood-pressure reading, without diagnosis of hypertension: Secondary | ICD-10-CM

## 2022-07-06 DIAGNOSIS — Z862 Personal history of diseases of the blood and blood-forming organs and certain disorders involving the immune mechanism: Secondary | ICD-10-CM | POA: Diagnosis not present

## 2022-07-06 DIAGNOSIS — N920 Excessive and frequent menstruation with regular cycle: Secondary | ICD-10-CM

## 2022-07-06 DIAGNOSIS — F17200 Nicotine dependence, unspecified, uncomplicated: Secondary | ICD-10-CM

## 2022-07-12 ENCOUNTER — Encounter (HOSPITAL_BASED_OUTPATIENT_CLINIC_OR_DEPARTMENT_OTHER): Payer: Medicaid Other | Admitting: Advanced Practice Midwife

## 2022-07-13 ENCOUNTER — Encounter (HOSPITAL_BASED_OUTPATIENT_CLINIC_OR_DEPARTMENT_OTHER): Payer: Self-pay | Admitting: Obstetrics & Gynecology

## 2022-07-13 NOTE — Progress Notes (Signed)
GYNECOLOGY  VISIT  CC:   follow up after ultrasound  HPI: 42 y.o. [id number] Significant Other Black or African American female here for discussion of ultrasound.  Pt has menorrhagia and is desirous of treatment.  Uterus is 7.5 x 6 x 6cm with 3.1cm subserosal fibroid.  Endometrium was 2.24mm.  Treatment options discussed.  Pt cannot be on combination OCPs.  Progesterone options, myomectomy, [place], RFA of fibroid and hysterectomy discussed.  Pt is tired of bleeding and desirous of definitive treatment.    Procedure discussed with patient.  Hospital stay, recovery and pain management all discussed.  Risks discussed including but not limited to bleeding, 1% risk of receiving a  transfusion, infection, 3-4% risk of bowel/bladder/ureteral/vascular injury discussed as well as possible need for additional surgery if injury does occur discussed.  DVT/PE and rare risk of death discussed.  My actual complications with prior surgeries discussed.  Vaginal cuff dehiscence discussed.  Hernia formation discussed.  Positioning and incision locations discussed.  Patient aware if pathology abnormal she may need additional treatment.  All questions answered.    . Better blood pressure control is needed.  Will need to have pt follow up with PCP.   Past Medical History:  Diagnosis Date   Anemia    Arthritis    Bronchitis    Depression    no meds, fine right now   GERD (gastroesophageal reflux disease)    Heart murmur    Hypertension    "havent been taking my medicine in awhile"   Insomnia    Insomnia    Migraines    Pancreatitis    Perimenopausal    PID (acute pelvic inflammatory disease)    Pregnancy induced hypertension    Preterm labor    PTSD (post-traumatic stress disorder)    Seasonal allergies     MEDS:   Current Outpatient Medications on File Prior to Visit  Medication Sig Dispense Refill   albuterol (PROVENTIL) (2.5 MG/3ML) 0.083% nebulizer solution Take 2.5 mg by nebulization every 6 (six) hours  as needed for wheezing or shortness of breath.     amLODipine (NORVASC) 10 MG tablet Take 1 tablet (10 mg total) by mouth daily. 30 tablet 2   estradiol (CLIMARA - DOSED IN MG/24 HR) 0.025 mg/24hr patch Place 1 patch (0.025 mg total) onto the skin once a week. 12 patch 5   methocarbamol (ROBAXIN) 750 MG tablet Take 1 tablet (750 mg total) by mouth every 6 (six) hours as needed for muscle spasms. 30 tablet 0   metoprolol succinate (TOPROL-XL) 25 MG 24 hr tablet Take 1 tablet (25 mg total) by mouth daily. 90 tablet 3   norethindrone (MICRONOR) 0.35 MG tablet Take 1 tablet (0.35 mg total) by mouth daily. 28 tablet 3   nystatin ointment (MYCOSTATIN) Apply 1 application  topically daily as needed (rash).     pantoprazole (PROTONIX) 40 MG tablet Take 1 tablet (40 mg total) by mouth daily. 90 tablet 1   No current facility-administered medications on file prior to visit.    ALLERGIES: Aspirin, Hydromorphone hcl, Aloe vera, Dust mite extract, Morphine, and Shrimp [shellfish allergy]  SH:  significant other, smoker  Review of Systems  Constitutional: Negative.   Respiratory: Negative.    Cardiovascular: Negative.   Genitourinary:        Bleeding    PHYSICAL EXAMINATION:    BP (!) 160/98   Pulse 74   Ht 5\' 1"  (1.549 m)   Wt 145 lb 6.4 oz (66 kg)  LMP 06/26/2022   BMI 27.47 kg/m     General appearance: alert, cooperative and appears stated age   Assessment/Plan: 1. Menorrhagia with regular cycle - will proceed with surgical planning  2. Smoking - recommended pt try to cut back or stop prior to surgery  3. History of iron deficiency anemia - currently hb is normal so timing is good for surgical planning  4. Elevated blood pressure reading - have pt start checking readings at home and have follow up with pcp

## 2022-07-14 ENCOUNTER — Telehealth (HOSPITAL_BASED_OUTPATIENT_CLINIC_OR_DEPARTMENT_OTHER): Payer: Self-pay | Admitting: Obstetrics & Gynecology

## 2022-07-14 NOTE — Telephone Encounter (Signed)
Patient called and wanted to know when will her surgery get scheduled.

## 2022-07-19 ENCOUNTER — Encounter (HOSPITAL_BASED_OUTPATIENT_CLINIC_OR_DEPARTMENT_OTHER): Payer: Self-pay

## 2022-07-23 ENCOUNTER — Other Ambulatory Visit: Payer: Self-pay | Admitting: Nurse Practitioner

## 2022-07-23 DIAGNOSIS — I10 Essential (primary) hypertension: Secondary | ICD-10-CM

## 2022-07-24 ENCOUNTER — Other Ambulatory Visit: Payer: Self-pay | Admitting: Nurse Practitioner

## 2022-07-24 DIAGNOSIS — K219 Gastro-esophageal reflux disease without esophagitis: Secondary | ICD-10-CM

## 2022-08-29 ENCOUNTER — Other Ambulatory Visit: Payer: Self-pay

## 2022-08-29 ENCOUNTER — Other Ambulatory Visit (HOSPITAL_BASED_OUTPATIENT_CLINIC_OR_DEPARTMENT_OTHER): Payer: Self-pay | Admitting: Obstetrics & Gynecology

## 2022-08-29 ENCOUNTER — Encounter (HOSPITAL_BASED_OUTPATIENT_CLINIC_OR_DEPARTMENT_OTHER): Payer: Self-pay | Admitting: Obstetrics & Gynecology

## 2022-08-29 DIAGNOSIS — N920 Excessive and frequent menstruation with regular cycle: Secondary | ICD-10-CM

## 2022-08-29 NOTE — Progress Notes (Addendum)
Spoke w/ via phone for pre-op interview---Kayla Dunn needs dos---- type & screen, bmp and urine pregnancy             Dunn results------08/30/22 cbc , 10/20/21 EKG in chart & epic COVID test -----patient states asymptomatic no test needed Arrive at -------1045 on Tuesday, 09/06/22 NPO after MN NO Solid Food.  Clear liquids from MN until---0945 Med rec completed Medications to take morning of surgery -----Albuterol nebulizer prn, Amlodipine, estrogen patch, Robaxin prn, Metoprolol, Protonix Diabetic medication -----n/a Patient instructed no nail polish to be worn day of surgery Patient instructed to bring photo id and insurance card day of surgery Patient aware to have Driver (ride ) / caregiver    for 24 hours after surgery - [name] Patient Special Instructions -----Extended / overnight stay instructions given. No smoking, alcohol or drugs for 24 hours prior to surgery. Pre-Op special Istructions -----Requested orders from Dr. Sabra Heck on 08/29/22. Patient verbalized understanding of instructions that were given at this phone interview. Patient denies shortness of breath, chest pain, fever, cough at this phone interview.  Per Dr. Sabra Heck, via Epic IB, okay to do type & screen on day of surgery. Patient had cbc at [facility] on 08/30/22.

## 2022-08-29 NOTE — Progress Notes (Signed)
Your procedure is scheduled on Tuesday, 09/06/22.  Report to [facility].   Call this number if you have problems the morning of surgery  :[phone].   OUR ADDRESS IS [address].  WE ARE LOCATED IN [facility].  PLEASE BRING YOUR INSURANCE CARD AND PHOTO ID DAY OF SURGERY.  ONLY 2 PEOPLE ARE ALLOWED IN  WAITING  ROOM / CURRENTLY NO ONE UNDER AGE 41                                     REMEMBER:  DO NOT EAT FOOD, CANDY GUM OR MINTS  AFTER MIDNIGHT THE NIGHT BEFORE YOUR SURGERY . YOU MAY HAVE CLEAR LIQUIDS FROM MIDNIGHT THE NIGHT BEFORE YOUR SURGERY UNTIL  9:45 AM. NO CLEAR LIQUIDS AFTER   9:45 AM DAY OF SURGERY.  YOU MAY  BRUSH YOUR TEETH MORNING OF SURGERY AND RINSE YOUR MOUTH OUT, NO CHEWING GUM CANDY OR MINTS.     CLEAR LIQUID DIET    Allowed      Water                                                                   Coffee and tea, regular and decaf  (NO cream or milk products of any type, may sweeten)                         Carbonated beverages, regular and diet                                    Sports drinks like Gatorade _____________________________________________________________________     TAKE ONLY THESE MEDICATIONS MORNING OF SURGERY: Albuterol nebulizer if needed, Amlodipine, Metoprolol, Estrogen patch, Robaxin if needed, Protonix    UP TO 4 VISITORS  MAY VISIT IN THE EXTENDED RECOVERY ROOM UNTIL 800 PM ONLY.  ONE  VISITOR AGE 40 AND OVER MAY SPEND THE NIGHT AND MUST BE IN EXTENDED RECOVERY ROOM NO LATER THAN 800 PM . YOUR DISCHARGE TIME AFTER YOU SPEND THE NIGHT IS 900 AM THE MORNING AFTER YOUR SURGERY.  YOU MAY PACK A SMALL OVERNIGHT BAG WITH TOILETRIES FOR YOUR OVERNIGHT STAY IF YOU WISH.  YOUR PRESCRIPTION MEDICATIONS WILL BE PROVIDED DURING [place].                                      DO NOT WEAR JEWERLY/  METAL/  PIERCINGS (INCLUDING NO PLASTIC PIERCINGS) DO NOT WEAR LOTIONS,  POWDERS, PERFUMES OR NAIL POLISH ON YOUR FINGERNAILS. TOENAIL POLISH IS OK TO WEAR. DO NOT SHAVE FOR 48 HOURS PRIOR TO DAY OF SURGERY.  CONTACTS, GLASSES, OR DENTURES MAY NOT BE WORN TO SURGERY.  REMEMBER: NO SMOKING, VAPING ,  DRUGS OR ALCOHOL FOR 24 HOURS BEFORE YOUR SURGERY.  [facility] IS NOT RESPONSIBLE  FOR ANY BELONGINGS.                                                                    Marland Kitchen           [facility] - Preparing for Surgery Before surgery, you can play an important role.  Because skin is not sterile, your skin needs to be as free of germs as possible.  You can reduce the number of germs on your skin by washing with CHG (chlorahexidine gluconate) soap before surgery.  CHG is an antiseptic cleaner which kills germs and bonds with the skin to continue killing germs even after washing. Please DO NOT use if you have an allergy to CHG or antibacterial soaps.  If your skin becomes reddened/irritated stop using the CHG and inform your nurse when you arrive at Short Stay. Do not shave (including legs and underarms) for at least 48 hours prior to the first CHG shower.  You may shave your face/neck. Please follow these instructions carefully:  1.  Shower with CHG Soap the night before surgery and the  morning of Surgery.  2.  If you choose to wash your hair, wash your hair first as usual with your  normal  shampoo.  3.  After you shampoo, rinse your hair and body thoroughly to remove the  shampoo.                                        4.  Use CHG as you would any other liquid soap.  You can apply chg directly  to the skin and wash , chg soap provided, night before and morning of your surgery.  5.  Apply the CHG Soap to your body ONLY FROM THE NECK DOWN.   Do not use on face/ open                           Wound or open sores. Avoid contact with eyes, ears mouth and genitals (private parts).                       Wash face,  Genitals (private parts) with  your normal soap.             6.  Wash thoroughly, paying special attention to the area where your surgery  will be performed.  7.  Thoroughly rinse your body with warm water from the neck down.  8.  DO NOT shower/wash with your normal soap after using and rinsing off  the CHG Soap.             9.  Pat yourself dry with a clean towel.            10.  Wear clean pajamas.            11.  Place clean sheets on your bed the night of your first shower and do not  sleep with pets. Day of Surgery : Do not apply any lotions/ powders the morning of surgery.  Please wear clean clothes to the hospital/surgery  center.  IF YOU HAVE ANY SKIN IRRITATION OR PROBLEMS WITH THE SURGICAL SOAP, PLEASE GET A BAR OF GOLD DIAL SOAP AND SHOWER THE NIGHT BEFORE YOUR SURGERY AND THE MORNING OF YOUR SURGERY. PLEASE LET THE NURSE KNOW MORNING OF YOUR SURGERY IF YOU HAD ANY PROBLEMS WITH THE SURGICAL SOAP.   ________________________________________________________________________                                                        QUESTIONS [facility] PRE OP NURSE PHONE [phone].

## 2022-08-30 ENCOUNTER — Inpatient Hospital Stay: Payer: Medicaid Other | Attending: Physician Assistant

## 2022-08-30 ENCOUNTER — Inpatient Hospital Stay (HOSPITAL_BASED_OUTPATIENT_CLINIC_OR_DEPARTMENT_OTHER): Payer: Medicaid Other | Admitting: Internal Medicine

## 2022-08-30 ENCOUNTER — Encounter (HOSPITAL_COMMUNITY): Payer: Self-pay

## 2022-08-30 ENCOUNTER — Telehealth: Payer: Self-pay | Admitting: Medical Oncology

## 2022-08-30 ENCOUNTER — Other Ambulatory Visit (HOSPITAL_COMMUNITY): Payer: Medicaid Other

## 2022-08-30 ENCOUNTER — Other Ambulatory Visit (HOSPITAL_BASED_OUTPATIENT_CLINIC_OR_DEPARTMENT_OTHER): Payer: Self-pay | Admitting: Obstetrics & Gynecology

## 2022-08-30 DIAGNOSIS — D509 Iron deficiency anemia, unspecified: Secondary | ICD-10-CM | POA: Diagnosis not present

## 2022-08-30 DIAGNOSIS — D508 Other iron deficiency anemias: Secondary | ICD-10-CM

## 2022-08-30 DIAGNOSIS — Z01818 Encounter for other preprocedural examination: Secondary | ICD-10-CM

## 2022-08-30 LAB — IRON AND IRON BINDING CAPACITY (CC-WL,HP ONLY)
Iron: 119 ug/dL (ref 28–170)
Saturation Ratios: 27 % (ref 10.4–31.8)
TIBC: 434 ug/dL (ref 250–450)
UIBC: 315 ug/dL (ref 148–442)

## 2022-08-30 LAB — CBC WITH DIFFERENTIAL (CANCER CENTER ONLY)
Abs Immature Granulocytes: 0.01 10*3/uL (ref 0.00–0.07)
Basophils Absolute: 0.1 10*3/uL (ref 0.0–0.1)
Basophils Relative: 1 %
Eosinophils Absolute: 0.1 10*3/uL (ref 0.0–0.5)
Eosinophils Relative: 2 %
HCT: 37.7 % (ref 36.0–46.0)
Hemoglobin: 12.6 g/dL (ref 12.0–15.0)
Immature Granulocytes: 0 %
Lymphocytes Relative: 30 %
Lymphs Abs: 2.1 10*3/uL (ref 0.7–4.0)
MCH: 30.8 pg (ref 26.0–34.0)
MCHC: 33.4 g/dL (ref 30.0–36.0)
MCV: 92.2 fL (ref 80.0–100.0)
Monocytes Absolute: 0.8 10*3/uL (ref 0.1–1.0)
Monocytes Relative: 12 %
Neutro Abs: 3.9 10*3/uL (ref 1.7–7.7)
Neutrophils Relative %: 55 %
Platelet Count: 404 10*3/uL — ABNORMAL HIGH (ref 150–400)
RBC: 4.09 MIL/uL (ref 3.87–5.11)
RDW: 13.2 % (ref 11.5–15.5)
WBC Count: 6.9 10*3/uL (ref 4.0–10.5)
nRBC: 0 % (ref 0.0–0.2)

## 2022-08-30 NOTE — Progress Notes (Signed)
No show

## 2022-08-30 NOTE — Telephone Encounter (Signed)
Pt was unaware of her appt today with Dr. Julien Nordmann.

## 2022-08-31 ENCOUNTER — Telehealth: Payer: Self-pay | Admitting: Internal Medicine

## 2022-08-31 LAB — FERRITIN: Ferritin: 21 ng/mL (ref 11–307)

## 2022-08-31 NOTE — Telephone Encounter (Signed)
Called to reschedule 03/20 appointments, left a voicemail.

## 2022-09-02 ENCOUNTER — Ambulatory Visit (INDEPENDENT_AMBULATORY_CARE_PROVIDER_SITE_OTHER): Payer: Medicaid Other | Admitting: Obstetrics & Gynecology

## 2022-09-02 ENCOUNTER — Encounter (HOSPITAL_BASED_OUTPATIENT_CLINIC_OR_DEPARTMENT_OTHER): Payer: Self-pay | Admitting: Obstetrics & Gynecology

## 2022-09-02 ENCOUNTER — Other Ambulatory Visit (HOSPITAL_COMMUNITY)
Admission: RE | Admit: 2022-09-02 | Discharge: 2022-09-02 | Disposition: A | Discharge status: Home / Self Care | Payer: Medicaid Other | Source: Ambulatory Visit | Attending: Obstetrics & Gynecology | Admitting: Obstetrics & Gynecology

## 2022-09-02 VITALS — BP 138/80 | HR 60 | Ht 61.5 in | Wt 147.4 lb

## 2022-09-02 DIAGNOSIS — B9689 Other specified bacterial agents as the cause of diseases classified elsewhere: Secondary | ICD-10-CM

## 2022-09-02 DIAGNOSIS — N760 Acute vaginitis: Secondary | ICD-10-CM

## 2022-09-02 DIAGNOSIS — N8500 Endometrial hyperplasia, unspecified: Secondary | ICD-10-CM | POA: Diagnosis not present

## 2022-09-02 DIAGNOSIS — Z01818 Encounter for other preprocedural examination: Secondary | ICD-10-CM | POA: Diagnosis present

## 2022-09-02 DIAGNOSIS — Z862 Personal history of diseases of the blood and blood-forming organs and certain disorders involving the immune mechanism: Secondary | ICD-10-CM | POA: Diagnosis not present

## 2022-09-02 DIAGNOSIS — N921 Excessive and frequent menstruation with irregular cycle: Secondary | ICD-10-CM | POA: Insufficient documentation

## 2022-09-02 MED ORDER — METRONIDAZOLE 500 MG PO TABS: 500.0000 mg | ORAL_TABLET | Freq: Two times a day (BID) | ORAL | 0 refills | Status: DC

## 2022-09-02 NOTE — Progress Notes (Signed)
42 y.o. [id number] Significant Other Black or African American female here for discussion of upcoming procedure.  TLH/bilateral salpingectomy/possible oophorectomy, and cystoscopy planned due to menorrhagia, uterine fibroids.  Pre-op evaluation thus far has included ultrasound.  She is here for endometrial biopsy today.     Procedure discussed with patient.  Hospital stay, recovery and pain management all discussed.  Risks discussed including but not limited to bleeding, 1% risk of receiving a  transfusion, infection, 3-4% risk of bowel/bladder/ureteral/vascular injury discussed as well as possible need for additional surgery if injury does occur discussed.  DVT/PE and rare risk of death discussed.  My actual complications with prior surgeries discussed.  Vaginal cuff dehiscence discussed.  Hernia formation discussed.  Positioning and incision locations discussed.  Patient aware if pathology abnormal she may need additional treatment.  All questions answered.     Ob Hx:   Patient's last menstrual period was 08/25/2022.          Sexually active: No. Birth control: condoms Last pap: 11/2021 WNL Last MMG: guidelines reviewed Smoking: Yes   Past Surgical History:  Procedure Laterality Date   ANTERIOR CERVICAL DECOMP/DISCECTOMY FUSION N/A 10/25/2021   Procedure: Anterior Cervical Decompression Fusion - Cervical five-Cervical six - Cervical six-Cervical seven - Cervical seven-Thoracic one;  Surgeon: Earnie Larsson, MD;  Location: [facility];  Service: Neurosurgery;  Laterality: N/A;   CESAREAN SECTION     x 2   CHOLECYSTECTOMY N/A 08/27/2013   Procedure: LAPAROSCOPIC CHOLECYSTECTOMY WITH INTRAOPERATIVE CHOLANGIOGRAM;  Surgeon: Pedro Earls, MD;  Location: [facility];  Service: General;  Laterality: N/A;   DILATION AND CURETTAGE OF UTERUS     2010 and around 2018   OVARY SURGERY  2004   2004 & 2012 tumor removal   TUBAL LIGATION  2012    Past Medical History:  Diagnosis Date   Anemia 2023   Follows w/ Dr.  [facility]ien [facility]mann [facility]. s/p iron infusions   Arthritis    back   Asthma    mild   Bronchitis    Chronic low back pain    Follows w/ ortho, Dr. Frankey Shown. w/ bilateral sciatica   COVID-19 06/28/2019   Depression    hx of   GERD (gastroesophageal reflux disease)    Follows w/ PCP.   Heart murmur    Heart palpitations    Follows w/ PCP. Patient is taking a beta blocker.   Hypertension    Follows with PCP, Lazaro Arms, NP.   Insomnia    Migraines    Follows w/ PCP.   Pancreatitis 2015   Perimenopausal    PID (acute pelvic inflammatory disease) 2010   Pregnancy induced hypertension    Preterm labor    PTSD (post-traumatic stress disorder)    Radiculopathy of cervical spine    Follows w/ ortho, Dr. Frankey Shown.   Seasonal allergies    Sickle cell trait (HCC)    Wears glasses     Allergies: Aloe vera, Aspirin, Hydromorphone hcl, Morphine, Shrimp [shellfish allergy], and Dust mite extract  Current Outpatient Medications  Medication Sig Dispense Refill   albuterol (PROVENTIL) (2.5 MG/3ML) 0.083% nebulizer solution Take 2.5 mg by nebulization every 6 (six) hours as needed for wheezing or shortness of breath.     amLODipine (NORVASC) 10 MG tablet TAKE 1 TABLET BY MOUTH EVERY DAY 90 tablet 0   estradiol (CLIMARA - DOSED IN MG/24 HR) 0.025 mg/24hr patch Place 1 patch (0.025 mg total) onto the skin once a  week. 12 patch 5   methocarbamol (ROBAXIN) 750 MG tablet Take 1 tablet (750 mg total) by mouth every 6 (six) hours as needed for muscle spasms. 30 tablet 0   metoprolol succinate (TOPROL-XL) 25 MG 24 hr tablet Take 1 tablet (25 mg total) by mouth daily. 90 tablet 3   nystatin ointment (MYCOSTATIN) Apply 1 application  topically daily as needed (rash).     pantoprazole (PROTONIX) 40 MG tablet TAKE 1 TABLET BY MOUTH EVERY DAY 90 tablet 1   norethindrone (INCASSIA) 0.35 MG tablet TAKE 1 TABLET BY MOUTH EVERY DAY (Patient not taking: Reported on 09/02/2022) 84 tablet 0    No current facility-administered medications for this visit.    ROS: A comprehensive review of systems was negative.  Exam:   BP 138/80 (BP Location: Right Arm, Patient Position: Sitting, Cuff Size: Large)   Pulse 60   Ht 5' 1.5" (1.562 m) Comment: Reported  Wt 147 lb 6.4 oz (66.9 kg)   LMP 08/25/2022   BMI 27.40 kg/m   General appearance: alert and cooperative Head: Normocephalic, without obvious abnormality, atraumatic Neck: no adenopathy, supple, symmetrical, trachea midline and thyroid not enlarged, symmetric, no tenderness/mass/nodules Lungs: clear to auscultation bilaterally Heart: regular rate and rhythm, S1, S2 normal, no murmur, click, rub or gallop Abdomen: soft, non-tender; bowel sounds normal; no masses,  no organomegaly Extremities: extremities normal, atraumatic, no cyanosis or edema Skin: Skin color, texture, turgor normal. No rashes or lesions Lymph nodes: Cervical, supraclavicular, and axillary nodes normal. no inguinal nodes palpated Neurologic: Grossly normal  Pelvic: External genitalia:  no lesions              Urethra: normal appearing urethra with no masses, tenderness or lesions              Bartholins and Skenes: normal                 Vagina: normal appearing vagina with normal color and discharge, no lesions              Cervix: normal appearance              Pap taken: No.        Bimanual Exam:  Uterus:  uterus is normal size, shape, consistency and nontender                                      Adnexa:    not indicated  Endometrial biopsy recommended.  Discussed with patient.  Verbal and written consent obtained.   Procedure:  Speculum placed.  Cervix visualized and cleansed with betadine prep.  A single toothed tenaculum was applied to the anterior lip of the cervix.  Endometrial pipelle was advanced through the cervix into the endometrial cavity without difficulty.  Pipelle passed to8cm.  Suction applied and pipelle removed with good tissue sample  obtained.  Tenculum removed.  No bleeding noted.  Patient tolerated procedure well.  Chaperone, Ezekiel Ina, RN, present for examination.   Assessment/Plan: 1. Menorrhagia with irregular cycle - Surgical pathology( [facility]/ [facility])  2. Pre-op evaluation -Pre and post op instructions reviewed -Post op pain medications reviewed -Medications/Vitamins reviewed.  3. BV (bacterial vaginosis) - metroNIDAZOLE (FLAGYL) 500 MG tablet; Take 1 tablet (500 mg total) by mouth 2 (two) times daily.  Dispense: 14 tablet; Refill: 0  4. History of iron deficiency anemia

## 2022-09-03 NOTE — Progress Notes (Deleted)
[facility] OFFICE PROGRESS NOTE  Fenton Foy, NP [place] [address]  DIAGNOSIS: Iron Deficiency Anemia secondary to heavy menstrual cycles.   PRIOR THERAPY: None  CURRENT THERAPY: 1) IV iron PRN, most recent venofer 300 mg on 04/25/22  INTERVAL HISTORY: Kayla Dunn 42 y.o. female returns to clinic today for follow-up visit.  The patient was last seen in the clinic by Dr. Julien Nordmann 06/02/2022.  She had a follow-up appointment scheduled for 08/30/2022 G0 for her lab appointment to left before being seen.  The patient is scheduled for a hysterectomy and salpingectomy with possible oophorectomy by her GYN Dr. Sabra Heck on 09/06/2022.  The patient received IV iron.  With Venofer the most recent being on 04/25/2022.  Iron supplements?  Heavy cycles?  Fatigue, sluggishness, cold intolerance.  Any abnormal bleeding or bruising?  Lightheadedness?  Menorrhagia She is here today for evaluation and follow-up visit  MEDICAL HISTORY: Past Medical History:  Diagnosis Date   Anemia 2023   Follows w/ Dr. Julien [facility]mann [facility]. s/p iron infusions   Arthritis    back   Asthma    mild   Bronchitis    Chronic low back pain    Follows w/ ortho, Dr. Frankey Shown. w/ bilateral sciatica   COVID-19 06/28/2019   Depression    hx of   GERD (gastroesophageal reflux disease)    Follows w/ PCP.   Heart murmur    Heart palpitations    Follows w/ PCP. Patient is taking a beta blocker.   Hypertension    Follows with PCP, Lazaro Arms, NP.   Insomnia    Migraines    Follows w/ PCP.   Pancreatitis 2015   Perimenopausal    PID (acute pelvic inflammatory disease) 2010   Pregnancy induced hypertension    Preterm labor    PTSD (post-traumatic stress disorder)    Radiculopathy of cervical spine    Follows w/ ortho, Dr. Frankey Shown.   Seasonal allergies    Sickle cell trait (HCC)    Wears glasses     ALLERGIES:  is allergic to aloe vera, aspirin,  hydromorphone hcl, morphine, shrimp [shellfish allergy], and dust mite extract.  MEDICATIONS:  Current Outpatient Medications  Medication Sig Dispense Refill   albuterol (PROVENTIL) (2.5 MG/3ML) 0.083% nebulizer solution Take 2.5 mg by nebulization every 6 (six) hours as needed for wheezing or shortness of breath.     amLODipine (NORVASC) 10 MG tablet TAKE 1 TABLET BY MOUTH EVERY DAY 90 tablet 0   estradiol (CLIMARA - DOSED IN MG/24 HR) 0.025 mg/24hr patch Place 1 patch (0.025 mg total) onto the skin once a week. 12 patch 5   methocarbamol (ROBAXIN) 750 MG tablet Take 1 tablet (750 mg total) by mouth every 6 (six) hours as needed for muscle spasms. 30 tablet 0   metoprolol succinate (TOPROL-XL) 25 MG 24 hr tablet Take 1 tablet (25 mg total) by mouth daily. 90 tablet 3   metroNIDAZOLE (FLAGYL) 500 MG tablet Take 1 tablet (500 mg total) by mouth 2 (two) times daily. 14 tablet 0   norethindrone (INCASSIA) 0.35 MG tablet TAKE 1 TABLET BY MOUTH EVERY DAY (Patient not taking: Reported on 09/02/2022) 84 tablet 0   nystatin ointment (MYCOSTATIN) Apply 1 application  topically daily as needed (rash).     pantoprazole (PROTONIX) 40 MG tablet TAKE 1 TABLET BY MOUTH EVERY DAY 90 tablet 1   No current facility-administered medications  for this visit.    SURGICAL HISTORY:  Past Surgical History:  Procedure Laterality Date   ANTERIOR CERVICAL DECOMP/DISCECTOMY FUSION N/A 10/25/2021   Procedure: Anterior Cervical Decompression Fusion - Cervical five-Cervical six - Cervical six-Cervical seven - Cervical seven-Thoracic one;  Surgeon: Earnie Larsson, MD;  Location: [facility];  Service: Neurosurgery;  Laterality: N/A;   CESAREAN SECTION     x 2   CHOLECYSTECTOMY N/A 08/27/2013   Procedure: LAPAROSCOPIC CHOLECYSTECTOMY WITH INTRAOPERATIVE CHOLANGIOGRAM;  Surgeon: Pedro Earls, MD;  Location: [facility];  Service: General;  Laterality: N/A;   [place] OF UTERUS     2010 and around 2018   OVARY SURGERY   2004   2004   TUBAL LIGATION  2012    REVIEW OF SYSTEMS:   Review of Systems  Constitutional: Negative for appetite change, chills, fatigue, fever and unexpected weight change.  HENT:   Negative for mouth sores, nosebleeds, sore throat and trouble swallowing.   Eyes: Negative for eye problems and icterus.  Respiratory: Negative for cough, hemoptysis, shortness of breath and wheezing.   Cardiovascular: Negative for chest pain and leg swelling.  Gastrointestinal: Negative for abdominal pain, constipation, diarrhea, nausea and vomiting.  Genitourinary: Negative for bladder incontinence, difficulty urinating, dysuria, frequency and hematuria.   Musculoskeletal: Negative for back pain, gait problem, neck pain and neck stiffness.  Skin: Negative for itching and rash.  Neurological: Negative for dizziness, extremity weakness, gait problem, headaches, light-headedness and seizures.  Hematological: Negative for adenopathy. Does not bruise/bleed easily.  Psychiatric/Behavioral: Negative for confusion, depression and sleep disturbance. The patient is not nervous/anxious.     PHYSICAL EXAMINATION:  Last menstrual period 08/25/2022.  ECOG PERFORMANCE STATUS: {CHL ONC ECOG [id number]  Physical Exam  Constitutional: Oriented to person, place, and time and well-developed, well-nourished, and in no distress. No distress.  HENT:  Head: Normocephalic and atraumatic.  Mouth/Throat: Oropharynx is clear and moist. No oropharyngeal exudate.  Eyes: Conjunctivae are normal. Right eye exhibits no discharge. Left eye exhibits no discharge. No scleral icterus.  Neck: Normal range of motion. Neck supple.  Cardiovascular: Normal rate, regular rhythm, normal heart sounds and intact distal pulses.   Pulmonary/Chest: Effort normal and breath sounds normal. No respiratory distress. No wheezes. No rales.  Abdominal: Soft. Bowel sounds are normal. Exhibits no distension and no mass. There is no tenderness.   Musculoskeletal: Normal range of motion. Exhibits no edema.  Lymphadenopathy:    No cervical adenopathy.  Neurological: Alert and oriented to person, place, and time. Exhibits normal muscle tone. Gait normal. Coordination normal.  Skin: Skin is warm and dry. No rash noted. Not diaphoretic. No erythema. No pallor.  Psychiatric: Mood, memory and judgment normal.  Vitals reviewed.  LABORATORY DATA: Lab Results  Component Value Date   WBC 6.9 08/30/2022   HGB 12.6 08/30/2022   HCT 37.7 08/30/2022   MCV 92.2 08/30/2022   PLT 404 (H) 08/30/2022      Chemistry      Component Value Date/Time   NA 139 03/31/2022 1321   NA 138 03/14/2022 1424   K 3.7 03/31/2022 1321   CL 103 03/31/2022 1321   CO2 29 03/31/2022 1321   BUN 7 03/31/2022 1321   BUN 15 03/14/2022 1424   CREATININE 0.70 03/31/2022 1321   CREATININE 0.66 03/03/2014 1105      Component Value Date/Time   CALCIUM 9.4 03/31/2022 1321   ALKPHOS 93 03/31/2022 1321   AST 26 03/31/2022 1321   ALT 14 03/31/2022  1321   BILITOT 0.6 03/31/2022 1321       RADIOGRAPHIC STUDIES:  No results found.   ASSESSMENT/PLAN:  This is a very pleasant 42 year old African-American female referred to the clinic for iron deficiency anemia.  The patient also has sickle cell trait.   The patient does not tolerate oral iron supplements well due to GI upset and nausea.  The patient had several lab studies performed today including a CBC, CMP, iron studies, ferritin, last week which shows ***.   No IV iron at this time?***  F/U in 3 months  Iron rich food.   Follow with GYN  The patient was advised to call immediately if she has any concerning symptoms in the interval. The patient voices understanding of current disease status and treatment options and is in agreement with the current care plan. All questions were answered. The patient knows to call the clinic with any problems, questions or concerns. We can certainly see the patient  much sooner if necessary      No orders of the defined types were placed in this encounter.    I spent {CHL ONC TIME VISIT - [id number] counseling the patient face to face. The total time spent in the appointment was {CHL ONC TIME VISIT - [id number].  Aftyn Nott L Rylen Hou, PA-C 09/03/22

## 2022-09-05 ENCOUNTER — Other Ambulatory Visit (HOSPITAL_BASED_OUTPATIENT_CLINIC_OR_DEPARTMENT_OTHER): Payer: Self-pay | Admitting: Obstetrics & Gynecology

## 2022-09-05 ENCOUNTER — Telehealth: Payer: Self-pay | Admitting: Physician Assistant

## 2022-09-05 LAB — SURGICAL PATHOLOGY

## 2022-09-05 NOTE — Telephone Encounter (Signed)
Rescheduled 03/20 to 04/01 per patient's request. Patient is notified of new date.

## 2022-09-06 ENCOUNTER — Encounter (HOSPITAL_BASED_OUTPATIENT_CLINIC_OR_DEPARTMENT_OTHER)
Admission: RE | Disposition: A | Discharge status: Home / Self Care | Payer: Self-pay | Source: Home / Self Care | Attending: Obstetrics & Gynecology

## 2022-09-06 ENCOUNTER — Encounter (HOSPITAL_BASED_OUTPATIENT_CLINIC_OR_DEPARTMENT_OTHER): Payer: Self-pay | Admitting: Obstetrics & Gynecology

## 2022-09-06 ENCOUNTER — Ambulatory Visit (HOSPITAL_BASED_OUTPATIENT_CLINIC_OR_DEPARTMENT_OTHER): Payer: Medicaid Other | Admitting: Anesthesiology

## 2022-09-06 ENCOUNTER — Other Ambulatory Visit (HOSPITAL_BASED_OUTPATIENT_CLINIC_OR_DEPARTMENT_OTHER): Payer: Self-pay | Admitting: Obstetrics & Gynecology

## 2022-09-06 ENCOUNTER — Ambulatory Visit (HOSPITAL_BASED_OUTPATIENT_CLINIC_OR_DEPARTMENT_OTHER)
Admission: RE | Admit: 2022-09-06 | Discharge: 2022-09-07 | Disposition: A | Discharge status: Home / Self Care | Payer: Medicaid Other | Attending: Obstetrics & Gynecology | Admitting: Obstetrics & Gynecology

## 2022-09-06 ENCOUNTER — Other Ambulatory Visit: Payer: Self-pay

## 2022-09-06 ENCOUNTER — Other Ambulatory Visit (HOSPITAL_COMMUNITY): Payer: Self-pay

## 2022-09-06 DIAGNOSIS — Z01818 Encounter for other preprocedural examination: Secondary | ICD-10-CM

## 2022-09-06 DIAGNOSIS — D259 Leiomyoma of uterus, unspecified: Secondary | ICD-10-CM

## 2022-09-06 DIAGNOSIS — N840 Polyp of corpus uteri: Secondary | ICD-10-CM | POA: Insufficient documentation

## 2022-09-06 DIAGNOSIS — D251 Intramural leiomyoma of uterus: Secondary | ICD-10-CM

## 2022-09-06 DIAGNOSIS — N879 Dysplasia of cervix uteri, unspecified: Secondary | ICD-10-CM | POA: Diagnosis not present

## 2022-09-06 DIAGNOSIS — N736 Female pelvic peritoneal adhesions (postinfective): Secondary | ICD-10-CM | POA: Diagnosis not present

## 2022-09-06 DIAGNOSIS — N920 Excessive and frequent menstruation with regular cycle: Secondary | ICD-10-CM | POA: Diagnosis not present

## 2022-09-06 HISTORY — DX: Radiculopathy, cervical region: M54.12

## 2022-09-06 HISTORY — DX: Sickle-cell trait: D57.3

## 2022-09-06 HISTORY — DX: Low back pain, unspecified: M54.50

## 2022-09-06 HISTORY — PX: TOTAL LAPAROSCOPIC HYSTERECTOMY WITH SALPINGECTOMY: SHX6742

## 2022-09-06 HISTORY — PX: CYSTOSCOPY: SHX5120

## 2022-09-06 HISTORY — DX: Unspecified asthma, uncomplicated: J45.909

## 2022-09-06 HISTORY — DX: Palpitations: R00.2

## 2022-09-06 HISTORY — DX: Presence of spectacles and contact lenses: Z97.3

## 2022-09-06 HISTORY — DX: Other chronic pain: G89.29

## 2022-09-06 LAB — CBC
HCT: 37.1 % (ref 36.0–46.0)
Hemoglobin: 12.1 g/dL (ref 12.0–15.0)
MCH: 30.7 pg (ref 26.0–34.0)
MCHC: 32.6 g/dL (ref 30.0–36.0)
MCV: 94.2 fL (ref 80.0–100.0)
Platelets: 416 10*3/uL — ABNORMAL HIGH (ref 150–400)
RBC: 3.94 MIL/uL (ref 3.87–5.11)
RDW: 13.3 % (ref 11.5–15.5)
WBC: 6.4 10*3/uL (ref 4.0–10.5)
nRBC: 0 % (ref 0.0–0.2)

## 2022-09-06 LAB — BASIC METABOLIC PANEL
Anion gap: 7 (ref 5–15)
BUN: 13 mg/dL (ref 6–20)
CO2: 23 mmol/L (ref 22–32)
Calcium: 8.6 mg/dL — ABNORMAL LOW (ref 8.9–10.3)
Chloride: 108 mmol/L (ref 98–111)
Creatinine, Ser: 0.71 mg/dL (ref 0.44–1.00)
GFR, Estimated: >60 mL/min (ref >60)
Glucose, Bld: 88 mg/dL (ref 70–99)
Potassium: 3.4 mmol/L — ABNORMAL LOW (ref 3.5–5.1)
Sodium: 138 mmol/L (ref 135–145)

## 2022-09-06 LAB — TYPE AND SCREEN
ABO/RH(D): O NEG
Antibody Screen: NEGATIVE

## 2022-09-06 LAB — HEMOGLOBIN: Hemoglobin: 12.6 g/dL (ref 12.0–15.0)

## 2022-09-06 LAB — POCT PREGNANCY, URINE: Preg Test, Ur: NEGATIVE

## 2022-09-06 SURGERY — HYSTERECTOMY, TOTAL, LAPAROSCOPIC, WITH SALPINGECTOMY
Anesthesia: General | Site: Vagina

## 2022-09-06 MED ORDER — ONDANSETRON HCL 4 MG/2ML IJ SOLN: 4.0000 mg | Freq: Four times a day (QID) | INTRAMUSCULAR | Status: DC | PRN

## 2022-09-06 MED ORDER — SIMETHICONE 80 MG PO CHEW: 80.0000 mg | CHEWABLE_TABLET | Freq: Four times a day (QID) | ORAL | Status: DC | PRN

## 2022-09-06 MED ORDER — OXYCODONE HCL 5 MG PO TABS
10.0000 mg | ORAL_TABLET | Freq: Once | ORAL | Status: AC
  Administered 2022-09-06: 10 mg via ORAL

## 2022-09-06 MED ORDER — ONDANSETRON HCL 4 MG/2ML IJ SOLN
INTRAMUSCULAR | Status: DC | PRN
  Administered 2022-09-06: 4 mg via INTRAVENOUS

## 2022-09-06 MED ORDER — OXYCODONE HCL 5 MG PO TABS: 5.0000 mg | ORAL_TABLET | Freq: Once | ORAL | Status: DC | PRN

## 2022-09-06 MED ORDER — SUGAMMADEX SODIUM 200 MG/2ML IV SOLN
INTRAVENOUS | Status: DC | PRN
  Administered 2022-09-06: 150 mg via INTRAVENOUS

## 2022-09-06 MED ORDER — DEXMEDETOMIDINE HCL IN NACL 80 MCG/20ML IV SOLN
INTRAVENOUS | Status: AC
  Filled 2022-09-06: qty 20

## 2022-09-06 MED ORDER — PANTOPRAZOLE SODIUM 40 MG IV SOLR
40.0000 mg | Freq: Every day | INTRAVENOUS | Status: DC
  Administered 2022-09-06: 40 mg via INTRAVENOUS

## 2022-09-06 MED ORDER — DEXTROSE-NACL 5-0.45 % IV SOLN: INTRAVENOUS | Status: DC

## 2022-09-06 MED ORDER — ROPIVACAINE HCL 5 MG/ML IJ SOLN
INTRAMUSCULAR | Status: AC
  Filled 2022-09-06: qty 30

## 2022-09-06 MED ORDER — ACETAMINOPHEN 10 MG/ML IV SOLN
INTRAVENOUS | Status: DC | PRN
  Administered 2022-09-06: 1000 mg via INTRAVENOUS

## 2022-09-06 MED ORDER — FENTANYL CITRATE (PF) 100 MCG/2ML IJ SOLN: 12.5000 ug | INTRAMUSCULAR | Status: DC | PRN

## 2022-09-06 MED ORDER — DEXAMETHASONE SODIUM PHOSPHATE 10 MG/ML IJ SOLN
INTRAMUSCULAR | Status: DC | PRN
  Administered 2022-09-06: 10 mg via INTRAVENOUS

## 2022-09-06 MED ORDER — POVIDONE-IODINE 10 % EX SWAB: 2.0000 | Freq: Once | CUTANEOUS | Status: DC

## 2022-09-06 MED ORDER — DIPHENHYDRAMINE HCL 50 MG/ML IJ SOLN
INTRAMUSCULAR | Status: AC
  Filled 2022-09-06: qty 1

## 2022-09-06 MED ORDER — ALUM & MAG HYDROXIDE-SIMETH 200-200-20 MG/5ML PO SUSP: 30.0000 mL | ORAL | Status: DC | PRN

## 2022-09-06 MED ORDER — GABAPENTIN 100 MG PO CAPS
ORAL_CAPSULE | ORAL | Status: AC
  Filled 2022-09-06: qty 1

## 2022-09-06 MED ORDER — SODIUM CHLORIDE 0.9 % IV SOLN
INTRAVENOUS | Status: AC
  Filled 2022-09-06: qty 2

## 2022-09-06 MED ORDER — PROPOFOL 10 MG/ML IV BOLUS
INTRAVENOUS | Status: AC
  Filled 2022-09-06: qty 20

## 2022-09-06 MED ORDER — GABAPENTIN 100 MG PO CAPS
100.0000 mg | ORAL_CAPSULE | Freq: Three times a day (TID) | ORAL | Status: DC
  Administered 2022-09-06 – 2022-09-07 (×2): 100 mg via ORAL

## 2022-09-06 MED ORDER — DEXMEDETOMIDINE HCL IN NACL 80 MCG/20ML IV SOLN
INTRAVENOUS | Status: DC | PRN
  Administered 2022-09-06 (×2): 8 ug via BUCCAL

## 2022-09-06 MED ORDER — MIDAZOLAM HCL 2 MG/2ML IJ SOLN
INTRAMUSCULAR | Status: DC | PRN
  Administered 2022-09-06: 2 mg via INTRAVENOUS

## 2022-09-06 MED ORDER — ONDANSETRON HCL 4 MG/2ML IJ SOLN
INTRAMUSCULAR | Status: AC
  Filled 2022-09-06: qty 6

## 2022-09-06 MED ORDER — LIDOCAINE 2% (20 MG/ML) 5 ML SYRINGE
INTRAMUSCULAR | Status: DC | PRN
  Administered 2022-09-06: 50 mg via INTRAVENOUS

## 2022-09-06 MED ORDER — MENTHOL 3 MG MT LOZG: 1.0000 | LOZENGE | OROMUCOSAL | Status: DC | PRN

## 2022-09-06 MED ORDER — ENOXAPARIN SODIUM 40 MG/0.4ML IJ SOSY
PREFILLED_SYRINGE | INTRAMUSCULAR | Status: AC
  Filled 2022-09-06: qty 0.4

## 2022-09-06 MED ORDER — DIPHENHYDRAMINE HCL 50 MG/ML IJ SOLN
INTRAMUSCULAR | Status: DC | PRN
  Administered 2022-09-06: 12.5 mg via INTRAVENOUS

## 2022-09-06 MED ORDER — ONDANSETRON HCL 4 MG PO TABS: 4.0000 mg | ORAL_TABLET | Freq: Four times a day (QID) | ORAL | Status: DC | PRN

## 2022-09-06 MED ORDER — LACTATED RINGERS IV SOLN: INTRAVENOUS | Status: DC

## 2022-09-06 MED ORDER — MIDAZOLAM HCL 2 MG/2ML IJ SOLN
INTRAMUSCULAR | Status: AC
  Filled 2022-09-06: qty 2

## 2022-09-06 MED ORDER — OXYCODONE-ACETAMINOPHEN 5-325 MG PO TABS
1.0000 | ORAL_TABLET | ORAL | Status: DC | PRN
  Administered 2022-09-06 – 2022-09-07 (×3): 2 via ORAL

## 2022-09-06 MED ORDER — ACETAMINOPHEN 325 MG PO TABS: 650.0000 mg | ORAL_TABLET | ORAL | Status: DC | PRN

## 2022-09-06 MED ORDER — SODIUM CHLORIDE 0.9 % IV SOLN
2.0000 g | INTRAVENOUS | Status: AC
  Administered 2022-09-06: 2 g via INTRAVENOUS

## 2022-09-06 MED ORDER — ENOXAPARIN SODIUM 40 MG/0.4ML IJ SOSY
40.0000 mg | PREFILLED_SYRINGE | INTRAMUSCULAR | Status: AC
  Administered 2022-09-06: 40 mg via SUBCUTANEOUS

## 2022-09-06 MED ORDER — OXYCODONE HCL 5 MG/5ML PO SOLN: 5.0000 mg | Freq: Once | ORAL | Status: DC | PRN

## 2022-09-06 MED ORDER — ROCURONIUM BROMIDE 10 MG/ML (PF) SYRINGE
PREFILLED_SYRINGE | INTRAVENOUS | Status: DC | PRN
  Administered 2022-09-06: 20 mg via INTRAVENOUS
  Administered 2022-09-06: 50 mg via INTRAVENOUS

## 2022-09-06 MED ORDER — FENTANYL CITRATE (PF) 250 MCG/5ML IJ SOLN
INTRAMUSCULAR | Status: AC
  Filled 2022-09-06: qty 5

## 2022-09-06 MED ORDER — PROPOFOL 10 MG/ML IV BOLUS
INTRAVENOUS | Status: DC | PRN
  Administered 2022-09-06: 150 mg via INTRAVENOUS

## 2022-09-06 MED ORDER — LIDOCAINE HCL (PF) 2 % IJ SOLN
INTRAMUSCULAR | Status: AC
  Filled 2022-09-06: qty 25

## 2022-09-06 MED ORDER — GABAPENTIN 100 MG PO CAPS
100.0000 mg | ORAL_CAPSULE | ORAL | Status: AC
  Administered 2022-09-06: 100 mg via ORAL

## 2022-09-06 MED ORDER — BUPIVACAINE HCL (PF) 0.25 % IJ SOLN
INTRAMUSCULAR | Status: DC | PRN
  Administered 2022-09-06: 10 mL

## 2022-09-06 MED ORDER — OXYCODONE-ACETAMINOPHEN 5-325 MG PO TABS
ORAL_TABLET | ORAL | Status: AC
  Filled 2022-09-06: qty 2

## 2022-09-06 MED ORDER — KETOROLAC TROMETHAMINE 30 MG/ML IJ SOLN
INTRAMUSCULAR | Status: AC
  Filled 2022-09-06: qty 1

## 2022-09-06 MED ORDER — FENTANYL CITRATE (PF) 100 MCG/2ML IJ SOLN: 25.0000 ug | INTRAMUSCULAR | Status: DC | PRN

## 2022-09-06 MED ORDER — DEXAMETHASONE SODIUM PHOSPHATE 10 MG/ML IJ SOLN
INTRAMUSCULAR | Status: AC
  Filled 2022-09-06: qty 1

## 2022-09-06 MED ORDER — ACETAMINOPHEN 10 MG/ML IV SOLN
INTRAVENOUS | Status: AC
  Filled 2022-09-06: qty 100

## 2022-09-06 MED ORDER — BUPIVACAINE HCL (PF) 0.25 % IJ SOLN
INTRAMUSCULAR | Status: AC
  Filled 2022-09-06: qty 30

## 2022-09-06 MED ORDER — PHENYLEPHRINE 80 MCG/ML (10ML) SYRINGE FOR IV PUSH (FOR BLOOD PRESSURE SUPPORT)
PREFILLED_SYRINGE | INTRAVENOUS | Status: AC
  Filled 2022-09-06: qty 20

## 2022-09-06 MED ORDER — FENTANYL CITRATE (PF) 250 MCG/5ML IJ SOLN
INTRAMUSCULAR | Status: DC | PRN
  Administered 2022-09-06 (×5): 25 ug via INTRAVENOUS
  Administered 2022-09-06: 50 ug via INTRAVENOUS
  Administered 2022-09-06: 25 ug via INTRAVENOUS
  Administered 2022-09-06: 50 ug via INTRAVENOUS

## 2022-09-06 MED ORDER — OXYCODONE-ACETAMINOPHEN 5-325 MG PO TABS
1.0000 | ORAL_TABLET | Freq: Four times a day (QID) | ORAL | 0 refills | Status: AC | PRN
  Filled 2022-09-06: qty 30, 4d supply, fill #0

## 2022-09-06 MED ORDER — OXYCODONE HCL 5 MG PO TABS
ORAL_TABLET | ORAL | Status: AC
  Filled 2022-09-06: qty 2

## 2022-09-06 MED ORDER — GABAPENTIN 100 MG PO CAPS
100.0000 mg | ORAL_CAPSULE | Freq: Three times a day (TID) | ORAL | 0 refills | Status: DC
  Filled 2022-09-06: qty 40, 14d supply, fill #0

## 2022-09-06 MED ORDER — PANTOPRAZOLE SODIUM 40 MG IV SOLR
INTRAVENOUS | Status: AC
  Filled 2022-09-06: qty 10

## 2022-09-06 MED ORDER — SODIUM CHLORIDE 0.9 % IR SOLN
Status: DC | PRN
  Administered 2022-09-06: 1000 mL

## 2022-09-06 SURGICAL SUPPLY — 73 items
ADH SKN CLS APL DERMABOND .7 (GAUZE/BANDAGES/DRESSINGS) ×2
APL PRP STRL LF DISP 70% ISPRP (MISCELLANEOUS) ×2
APL SRG 38 LTWT LNG FL B (MISCELLANEOUS) ×2
APL SWBSTK 6 STRL LF DISP (MISCELLANEOUS) ×2
APPLICATOR ARISTA FLEXITIP XL (MISCELLANEOUS) IMPLANT
APPLICATOR COTTON TIP 6 STRL (MISCELLANEOUS) IMPLANT
APPLICATOR COTTON TIP 6IN STRL (MISCELLANEOUS) ×2
BLADE SURG 10 STRL SS (BLADE) IMPLANT
CABLE HIGH FREQUENCY MONO STRZ (ELECTRODE) IMPLANT
CELL SAVER LIPIGURD (MISCELLANEOUS) IMPLANT
CHLORAPREP W/TINT 26 (MISCELLANEOUS) ×3 IMPLANT
COVER BACK TABLE 60X90IN (DRAPES) ×3 IMPLANT
COVER MAYO STAND STRL (DRAPES) ×3 IMPLANT
COVER SURGICAL LIGHT HANDLE (MISCELLANEOUS) IMPLANT
DERMABOND ADVANCED .7 DNX12 (GAUZE/BANDAGES/DRESSINGS) ×3 IMPLANT
DEVICE RETRIEVAL ALEXIS 14 (MISCELLANEOUS) IMPLANT
DILATOR CANAL MILEX (MISCELLANEOUS) IMPLANT
DRAPE SURG IRRIG POUCH 19X23 (DRAPES) ×3 IMPLANT
DRSG COVADERM PLUS 2X2 (GAUZE/BANDAGES/DRESSINGS) IMPLANT
EXTRT SYSTEM ALEXIS 14CM (MISCELLANEOUS)
EXTRT SYSTEM ALEXIS 17CM (MISCELLANEOUS)
GAUZE 4X4 16PLY ~~LOC~~+RFID DBL (SPONGE) ×6 IMPLANT
GLOVE BIO SURGEON STRL SZ 6.5 (GLOVE) ×3 IMPLANT
GLOVE BIOGEL PI IND STRL 6.5 (GLOVE) ×3 IMPLANT
GLOVE BIOGEL PI IND STRL 7.0 (GLOVE) ×6 IMPLANT
GLOVE ECLIPSE 6.5 STRL STRAW (GLOVE) ×6 IMPLANT
GOWN STRL REUS W/TWL XL LVL3 (GOWN DISPOSABLE) ×6 IMPLANT
HEMOSTAT ARISTA ABSORB 3G PWDR (HEMOSTASIS) IMPLANT
IRRIG SUCT STRYKERFLOW 2 WTIP (MISCELLANEOUS) ×2
IRRIGATION SUCT STRKRFLW 2 WTP (MISCELLANEOUS) IMPLANT
IV NS 1000ML (IV SOLUTION) ×4
IV NS 1000ML BAXH (IV SOLUTION) IMPLANT
KIT PINK PAD W/HEAD ARE REST (MISCELLANEOUS) ×2
KIT PINK PAD W/HEAD ARM REST (MISCELLANEOUS) ×3 IMPLANT
KIT TURNOVER CYSTO (KITS) ×3 IMPLANT
LEGGING LITHOTOMY PAIR STRL (DRAPES) ×3 IMPLANT
LIGASURE VESSEL 5MM BLUNT TIP (ELECTROSURGICAL) ×3 IMPLANT
NDL INSUFFLATION 14GA 120MM (NEEDLE) ×3 IMPLANT
NEEDLE INSUFFLATION 14GA 120MM (NEEDLE) ×2 IMPLANT
NS IRRIG 1000ML POUR BTL (IV SOLUTION) ×3 IMPLANT
OCCLUDER COLPOPNEUMO (BALLOONS) ×3 IMPLANT
PACK LAPAROSCOPY BASIN (CUSTOM PROCEDURE TRAY) ×3 IMPLANT
PENCIL SMOKE EVACUATOR (MISCELLANEOUS) IMPLANT
POUCH LAPAROSCOPIC INSTRUMENT (MISCELLANEOUS) ×3 IMPLANT
PROTECTOR NERVE ULNAR (MISCELLANEOUS) IMPLANT
SCISSORS LAP 5X35 DISP (ENDOMECHANICALS) IMPLANT
SET IRRIG Y TYPE TUR BLADDER L (SET/KITS/TRAYS/PACK) ×3 IMPLANT
SET SUCTION IRRIG HYDROSURG (IRRIGATION / IRRIGATOR) ×3 IMPLANT
SET TRI-LUMEN FLTR TB AIRSEAL (TUBING) ×3 IMPLANT
SHEARS HARMONIC ACE PLUS 36CM (ENDOMECHANICALS) ×3 IMPLANT
SLEEVE SCD COMPRESS KNEE MED (STOCKING) ×3 IMPLANT
SUT VIC AB 0 CT1 27 (SUTURE) ×4
SUT VIC AB 0 CT1 27XBRD ANBCTR (SUTURE) ×6 IMPLANT
SUT VIC AB 4-0 PS2 18 (SUTURE) ×3 IMPLANT
SUT VICRYL 0 UR6 27IN ABS (SUTURE) IMPLANT
SUT VLOC 180 0 9IN  GS21 (SUTURE) ×2
SUT VLOC 180 0 9IN GS21 (SUTURE) ×3 IMPLANT
SYR 10ML LL (SYRINGE) ×3 IMPLANT
SYR 50ML LL SCALE MARK (SYRINGE) ×6 IMPLANT
SYSTEM CARTER THOMASON II (TROCAR) IMPLANT
SYSTEM CONTND EXTRCTN KII BLLN (MISCELLANEOUS) IMPLANT
TIP UTERINE 5.1X6CM LAV DISP (MISCELLANEOUS) IMPLANT
TIP UTERINE 6.7X10CM GRN DISP (MISCELLANEOUS) IMPLANT
TIP UTERINE 6.7X6CM WHT DISP (MISCELLANEOUS) IMPLANT
TIP UTERINE 6.7X8CM BLUE DISP (MISCELLANEOUS) IMPLANT
TOWEL OR 17X24 6PK STRL BLUE (TOWEL DISPOSABLE) ×6 IMPLANT
TRAY FOLEY W/BAG SLVR 14FR LF (SET/KITS/TRAYS/PACK) ×3 IMPLANT
TROCAR ADV FIXATION 5X100MM (TROCAR) ×3 IMPLANT
TROCAR KII 8X100ML NONTHREADED (TROCAR) ×3 IMPLANT
TROCAR PORT AIRSEAL 5X120 (TROCAR) ×3 IMPLANT
TROCAR Z-THREAD FIOS 5X100MM (TROCAR) ×3 IMPLANT
WARMER LAPAROSCOPE (MISCELLANEOUS) ×3 IMPLANT
WATER STERILE IRR 3000ML UROMA (IV SOLUTION) ×3 IMPLANT

## 2022-09-06 NOTE — Progress Notes (Signed)
Day of Surgery Procedure(s) (LRB): TOTAL LAPAROSCOPIC HYSTERECTOMY WITH SALPINGECTOMY AND POSSIBLE OOPHORECTOMY (Bilateral) CYSTOSCOPY (N/A)  Subjective: Patient reports some pain control issues after coming from PACU.  Better now.  Has had liquids, voided x 2, and walked.  Has no nausea.  Objective: I have reviewed patient's vital signs, intake and output, medications, and labs. Vitals:   09/06/22 1613 09/06/22 1730  BP: (!) 172/97 (!) 150/99  Pulse: 83 66  Resp: (!) 22 18  Temp: 98 F (36.7 C)   SpO2: 96% 96%    General: alert and no distress Resp: clear to auscultation bilaterally Cardio: regular rate and rhythm, S1, S2 normal, no murmur, click, rub or gallop GI: soft, non-tender; bowel sounds normal; no masses,  no organomegaly Extremities: extremities normal, atraumatic, no cyanosis or edema Vaginal Bleeding: none  Assessment: s/p Procedure(s): TOTAL LAPAROSCOPIC HYSTERECTOMY WITH SALPINGECTOMY AND POSSIBLE OOPHORECTOMY (Bilateral) CYSTOSCOPY (N/A): stable and progressing well  Plan: Advance diet Encourage ambulation Advance to PO medication Watch BP closely and treat if needed  LOS: 0 days    Megan Salon, MD 09/06/2022, 6:45 PM

## 2022-09-06 NOTE — Transfer of Care (Signed)
Immediate Anesthesia Transfer of Care Note  Patient: Kayla Dunn  Procedure(s) Performed: TOTAL LAPAROSCOPIC HYSTERECTOMY WITH SALPINGECTOMY AND POSSIBLE OOPHORECTOMY (Bilateral: Abdomen) CYSTOSCOPY (Vagina )  Patient Location: PACU  Anesthesia Type:General  Level of Consciousness: drowsy and patient cooperative  Airway & Oxygen Therapy: Patient Spontanous Breathing and Patient connected to nasal cannula oxygen  Post-op Assessment: Report given to RN and Post -op Vital signs reviewed and stable  Post vital signs: Reviewed and stable  Last Vitals:  Vitals Value Taken Time  BP 154/84 09/06/22 1451  Temp    Pulse 65 09/06/22 1453  Resp 24 09/06/22 1453  SpO2 100 % 09/06/22 1453  Vitals shown include unvalidated device data.  Last Pain:  Vitals:   09/06/22 0953  TempSrc: Oral         Complications: No notable events documented.

## 2022-09-06 NOTE — Anesthesia Preprocedure Evaluation (Signed)
Anesthesia Evaluation  Patient identified by MRN, date of birth, ID band Patient awake    Reviewed: Allergy & Precautions, H&P , NPO status , Patient's Chart, lab work & pertinent test results  Airway Mallampati: II   Neck ROM: full    Dental   Pulmonary asthma , Current Smoker and Patient abstained from smoking.   breath sounds clear to auscultation       Cardiovascular hypertension,  Rhythm:regular Rate:Normal     Neuro/Psych  Headaches PSYCHIATRIC DISORDERS Anxiety Depression     Neuromuscular disease    GI/Hepatic ,GERD  ,,  Endo/Other    Renal/GU      Musculoskeletal  (+) Arthritis ,    Abdominal   Peds  Hematology   Anesthesia Other Findings   Reproductive/Obstetrics                             Anesthesia Physical Anesthesia Plan  ASA: 2  Anesthesia Plan: General   Post-op Pain Management:    Induction: Intravenous  PONV Risk Score and Plan: 2 and Ondansetron, Dexamethasone, Midazolam and Treatment may vary due to age or medical condition  Airway Management Planned: Oral ETT  Additional Equipment:   Intra-op Plan:   Post-operative Plan: Extubation in OR  Informed Consent: I have reviewed the patients History and Physical, chart, labs and discussed the procedure including the risks, benefits and alternatives for the proposed anesthesia with the patient or authorized representative who has indicated his/her understanding and acceptance.     Dental advisory given  Plan Discussed with: CRNA, Anesthesiologist and Surgeon  Anesthesia Plan Comments:        Anesthesia Quick Evaluation

## 2022-09-06 NOTE — Op Note (Signed)
09/06/2022  2:53 PM  PATIENT:  Kayla Dunn  42 y.o. female  PRE-OPERATIVE DIAGNOSIS:  Menorrhagia, Fibroids, h/o cesarean section  POST-OPERATIVE DIAGNOSIS:  Menorrhagia, Fibroids  PROCEDURE:  Procedure(s): TOTAL LAPAROSCOPIC HYSTERECTOMY WITH SALPINGECTOMY AND POSSIBLE OOPHORECTOMY CYSTOSCOPY  SURGEON:  Megan Salon  ASSISTANTS: Esperanza Sheets, MD.  An experienced assistant was required given the standard of surgical care given the complexity of the case.  This assistant was needed for exposure, dissection, suctioning, retraction, instrument exchange and for overall help during the procedure.  RNFA help was also unavailable.  ANESTHESIA:   general  ESTIMATED BLOOD LOSS: 25 mL  BLOOD ADMINISTERED:none   FLUIDS: 1000cc LR  UOP: 150cc clear UOP  SPECIMEN:  uterus, cervix and bilateral fallopian tubes  DISPOSITION OF SPECIMEN:  PATHOLOGY  FINDINGS: bulky uterus with large posterior fibroid  DESCRIPTION OF OPERATION: Patient is taken to the operating room. She is placed in the supine position. She is a running IV in place. Informed consent was present on the chart. SCDs on her lower extremities and functioning properly. Patient was positioned while she was awake.  Her legs were placed in the low lithotomy position in [place]. Her arms were tucked by the side.  General endotracheal anesthesia was administered by the anesthesia staff without difficulty. Dr. Marcie Bal, anesthesia, oversaw case.  Time out performed.    Clora prep was then used to prep the abdomen and Hibiclens was used to prep the inner thighs, perineum and vagina. Once 3 minutes had past the patient was draped in a normal standard fashion. The legs were lifted to the high lithotomy position. The cervix was visualized by placing a heavy weighted speculum in the posterior aspect of the vagina and using a curved Deaver retractor to the retract anteriorly. The anterior lip of the cervix was grasped with single-tooth  tenaculum.  The cervix sounded to 8 cm. Pratt dilators were used to dilate the cervix up to a #21. A RUMI uterine manipulator was obtained. A #8 disposable tip was placed on the RUMI manipulator as well as a 3.5, silver KOH ring. This was passed through the cervix and the bulb of the disposable tip was inflated with 10 cc of normal saline. There was a good fit of the KOH ring around the cervix. The tenaculum was removed. There is also good manipulation of the uterus. The speculum and retractor were removed as well. A Foley catheter was placed to straight drain.  Clear urine was noted. Legs were lowered to the low lithotomy position and attention was turned the abdomen.  The umbilicus was everted.  Marcaine 0.25% used to anesthetize the skin.  Using #11 blade, 98mm skin incision was made.  A Veress needle was obtained. Syringe of sterile saline was placed on a open Veress needle.  With the abdomen elevated, the Veress needle was passed into the umbilicus until the pop was heard and then fluid started to drip.  Then low flow CO2 gas was attached the needle and the pneumoperitoneum was achieved without difficulty. Once four liters of gas was in the abdomen the Veress needle was removed and a 5 millimeter non-bladed Optiview trocar and port were passed directly to the abdomen. The laparoscope was then used to confirm intraperitoneal placement. However, there was omentum adhered to the anterior abdominal wall.  Locations for RLQ, LLQ, and suprapubic ports were noted by transillumination of the abdominal wall.  0.25% marcaine was used to anesthetize the skin.  29mm skin incision was made in  the RLQ and an AirSeal port was placed underdirect visualization of the laparoscope.  Then a 45mm skin incision was made and a 74mm nonbladed trochar and port was placed in the LLQ.  Finally, and 62mm skin incision was made about 4cm above the pubic symphasis and an 5mm non-bladed port was placed with direct visualization of the  laparoscope.  All trochars were removed.    Then using the harmonic scalpel, the adhesions were taken down, freeing the omentum from the anterior abdominal wall.  Before this was done, the omentum was visualized completely to ensure there was no bowel in this.    Ureters were identifies.  Attention was turned to the left side. With uterus on stretch the left tube was excised off the ovary and mesosalpinx was dissected to free the tube. Then the left utero-ovarian pedicle was serially clamped cauterized and incised using the ligasure device. Left round ligament was serially clamped cauterized and incised. The anterior and posterior peritoneum of the inferior leaf of the broad ligament were opened. The beginning of the bladder flap was created.  There were adhesions present from prior cesarean sections.  With careful dissection, these were taken down.  The bladder was backfilled with NS to ensure where location of the edge was.  Once the adhesions were taken down, then the bladder was taken down below the level of the KOH ring. The left uterine artery skeletonized and then just superior to the KOH ring this vessel was serially clamped, cauterized, and incised.  Attention was turned the right side.  The uterus was placed on stretch to the opposite side.  The tube was excised off the ovary using sharp dissection a bipolar cautery.  The mesosalpinx was incised freeing the tube. Then the right uterine ovarian pedicle was serially clamped cauterized and incised. Next the right round ligament was serially clamped cauterized and incised. The anterior posterior peritoneum of the inferiorly for the broad ligament were opened. The anterior peritoneum was carried across to the dissection on the left side. The remainder of the bladder flap was created using sharp dissection. The bladder was well below the level of the KOH ring. The left uterine artery skeletonized. Then the left uterine artery, above the level of the KOH  ring, was serially clamped cauterized and incised. The uterus was devascularized at this point.  The colpotomy was performed a starting in the midline and using a harmonic scalpel with the inferior edge of the open blade  This was carried around a circumferential fashion until the vaginal mucosa was completely incised in the specimen was freed.  The specimen was then delivered to the vagina.  A vaginal occlusive device was used to maintain the pneumoperitoneum  Instruments were changed with a needle driver and Kobra graspers.  Using a 9 inch V. lock suture, the cuff was closed by incorporating the anterior and posterior vaginal mucosa in each stitch. This was carried across all the way to the left corner and a running fashion. Two stitches were brought back towards the midline and the suture was cut flush with the vagina. The needle was brought out the pelvis. The pelvis was irrigated. All pedicles were inspected. No bleeding was noted.   Co2 pressures were lowered to 14mm Hg.  Again, no bleeding was noted.  Ureters were noted deep in the pelvis to be peristalsing.  Arista was placed along the pedicles.  At this point the procedure was completed.  The remaining instruments were removed.  The ports (except  the suprapubic port) were removed under direct visualization of the laparoscope and the pneumoperitoneum was relieved.  The patient was taken out of Trendelenburg positioning.  Several deep breaths were given to the patient's trying to any gas the abdomen and finally the suprapubic port was removed.  The skin was then closed with subcuticular stitches of 3-0 Vicryl. The skin was cleansed Dermabond was applied. Attention was then turned the vagina and the cuff was inspected. No bleeding was noted. The anterior posterior vaginal mucosa was incorporated in each stitch. The Foley catheter was removed.  Cystoscopy was performed.  No sutures or bladder injuries were noted.  Ureters were noted with normal urine jets  from each one was seen.  Foley was left out after the cystoscopic fluid was drained and cystoscope removed.  Sponge, lap, needle, instrument counts were correct x2. Patient tolerated the procedure very well. She was awakened from anesthesia, extubated and taken to recovery in stable condition.    COUNTS:  YES  PLAN OF CARE: Transfer to PACU

## 2022-09-06 NOTE — H&P (Addendum)
Kayla Dunn is an 42 y.o. female G9 P78 Significant AA female with hx of menorrhagia and fibroids and hx of anemia here for definitive treatment with TLH/bilateral salpingectomy and cystoscopy.  She has undergone ultrasound and endometrial biopsy for preoperative evaluation.  Biopsy showed benign tissue.  Uterus measured 7.5  6.0 x 5.0cm with 3.1cm fibroid.  Alternatives have been discussed with pt including risks.  Pt here with her mother.  Additional questions answered.  Pt ready to proceed.  Pertinent Gynecological History: Menses:  5-10 days in length with several of these very heavy.  Uses ultra tampon plus on heavy days Bleeding: menorrhagia Contraception: tubal ligation DES exposure: denies Blood transfusions: none Sexually transmitted diseases: h/o PID in her 20's Previous GYN Procedures:  c section x 2   Last mammogram: has not started mammograms yet Last pap: normal Date: 12/15/2021 OB History: G9, P1354   Menstrual History: Patient's last menstrual period was 08/25/2022.    Past Medical History:  Diagnosis Date   Anemia 2023   Follows w/ Dr. Julien Nordmann [facility]. s/p iron infusions   Arthritis    back   Asthma    mild   Bronchitis    Chronic low back pain    Follows w/ ortho, Dr. Frankey Shown. w/ bilateral sciatica   COVID-19 06/28/2019   Depression    hx of   GERD (gastroesophageal reflux disease)    Follows w/ PCP.   Heart murmur    Heart palpitations    Follows w/ PCP. Patient is taking a beta blocker.   Hypertension    Follows with PCP, Lazaro Arms, NP.   Insomnia    Migraines    Follows w/ PCP.   Pancreatitis 2015   Perimenopausal    PID (acute pelvic inflammatory disease) 2010   Pregnancy induced hypertension    Preterm labor    PTSD (post-traumatic stress disorder)    Radiculopathy of cervical spine    Follows w/ ortho, Dr. Frankey Shown.   Seasonal allergies    Sickle cell trait ([place])    Wears glasses     Past Surgical  History:  Procedure Laterality Date   ANTERIOR CERVICAL DECOMP/DISCECTOMY FUSION N/A 10/25/2021   Procedure: Anterior Cervical Decompression Fusion - Cervical five-Cervical six - Cervical six-Cervical seven - Cervical seven-Thoracic one;  Surgeon: Earnie Larsson, MD;  Location: [facility];  Service: Neurosurgery;  Laterality: N/A;   CESAREAN SECTION     x 2   CHOLECYSTECTOMY N/A 08/27/2013   Procedure: LAPAROSCOPIC CHOLECYSTECTOMY WITH INTRAOPERATIVE CHOLANGIOGRAM;  Surgeon: Pedro Earls, MD;  Location: [facility];  Service: General;  Laterality: N/A;   [place] OF UTERUS     2010 and around 2018   OVARY SURGERY  2004   2004   TUBAL LIGATION  2012    Family History  Problem Relation Age of Onset   Cancer Mother        colon   Arthritis Mother    Breast cancer Mother    Hypertension Father    Liver disease Father    Stroke Father    Diabetes Paternal Grandmother    Hypertension Paternal Grandmother    Heart disease Paternal Grandmother     Social History:  reports that she has been smoking cigarettes. She has a 3.00 pack-year smoking history. She has never used smokeless tobacco. She reports current alcohol use of about 6.0 standard drinks of alcohol per week. She reports current drug use. Drug: Marijuana.  Allergies:  Allergies  Allergen Reactions   Aloe Vera Hives and Itching   Aspirin Anaphylaxis   Hydromorphone Hcl Anaphylaxis   Morphine Hives   Shrimp [Shellfish Allergy] Swelling   Dust Mite Extract     Seasonal allergies     Medications Prior to Admission  Medication Sig Dispense Refill Last Dose   amLODipine (NORVASC) 10 MG tablet TAKE 1 TABLET BY MOUTH EVERY DAY 90 tablet 0 09/06/2022 at 0800   methocarbamol (ROBAXIN) 750 MG tablet Take 1 tablet (750 mg total) by mouth every 6 (six) hours as needed for muscle spasms. 30 tablet 0 09/06/2022 at 0800   metoprolol succinate (TOPROL-XL) 25 MG 24 hr tablet Take 1 tablet (25 mg total) by mouth daily. 90 tablet 3  09/06/2022 at 0800   metroNIDAZOLE (FLAGYL) 500 MG tablet Take 1 tablet (500 mg total) by mouth 2 (two) times daily. 14 tablet 0 09/05/2022   pantoprazole (PROTONIX) 40 MG tablet TAKE 1 TABLET BY MOUTH EVERY DAY 90 tablet 1 09/06/2022 at 0800   albuterol (PROVENTIL) (2.5 MG/3ML) 0.083% nebulizer solution Take 2.5 mg by nebulization every 6 (six) hours as needed for wheezing or shortness of breath.   09/04/2022   estradiol (CLIMARA - DOSED IN MG/24 HR) 0.025 mg/24hr patch Place 1 patch (0.025 mg total) onto the skin once a week. 12 patch 5 09/04/2022   norethindrone (INCASSIA) 0.35 MG tablet TAKE 1 TABLET BY MOUTH EVERY DAY (Patient not taking: Reported on 09/02/2022) 84 tablet 0 Unknown   nystatin ointment (MYCOSTATIN) Apply 1 application  topically daily as needed (rash).   Unknown    Review of Systems  Constitutional: Negative.   Genitourinary:        Menorrhagia     Blood pressure 131/86, pulse 76, temperature 98.4 F (36.9 C), temperature source Oral, resp. rate 17, height 5\' 1"  (1.549 m), weight 67.4 kg, last menstrual period 08/25/2022, SpO2 99 %. Physical Exam  Results for orders placed or performed during the hospital encounter of 09/06/22 (from the past 24 hour(s))  Pregnancy, urine POC     Status: None   Collection Time: 09/06/22  9:33 AM  Result Value Ref Range   Preg Test, Ur NEGATIVE NEGATIVE  Type and screen     Status: None   Collection Time: 09/06/22 10:04 AM  Result Value Ref Range   ABO/RH(D) O NEG    Antibody Screen NEG    Sample Expiration      09/09/2022,2359 Performed at [facility] [place] [address] [place], [place] [address]   CBC     Status: Abnormal   Collection Time: 09/06/22 10:04 AM  Result Value Ref Range   WBC 6.4 4.0 - 10.5 K/uL   RBC 3.94 3.87 - 5.11 MIL/uL   Hemoglobin 12.1 12.0 - 15.0 g/dL   HCT 37.1 36.0 - 46.0 %   MCV 94.2 80.0 - 100.0 fL   MCH 30.7 26.0 - 34.0 pg   MCHC 32.6 30.0 - 36.0 g/dL   RDW 13.3 11.5 - 15.5 %    Platelets 416 (H) 150 - 400 K/uL   nRBC 0.0 0.0 - 0.2 %  Basic metabolic panel     Status: Abnormal   Collection Time: 09/06/22 10:04 AM  Result Value Ref Range   Sodium 138 135 - 145 mmol/L   Potassium 3.4 (L) 3.5 - 5.1 mmol/L   Chloride 108 98 - 111 mmol/L   CO2 23 22 - 32 mmol/L   Glucose, Bld 88  70 - 99 mg/dL   BUN 13 6 - 20 mg/dL   Creatinine, Ser 0.71 0.44 - 1.00 mg/dL   Calcium 8.6 (L) 8.9 - 10.3 mg/dL   GFR, Estimated >60 >60 mL/min   Anion gap 7 5 - 15    No results found.  Assessment/Plan: 42 yo [id number] AA female with menorrhagia, uterine fibroids here for definitive treatment with TLH/bilateral salpingectomy/possible BSO, cystoscopy.  Questions answered.  Pt ready to proceed.  Megan Salon 09/06/2022, 11:39 AM

## 2022-09-06 NOTE — Anesthesia Procedure Notes (Signed)
Procedure Name: Intubation Date/Time: 09/06/2022 12:23 PM  Performed by: Clearnce Sorrel, CRNAPre-anesthesia Checklist: Patient identified, Emergency Drugs available, Suction available and Patient being monitored Patient Re-evaluated:Patient Re-evaluated prior to induction Oxygen Delivery Method: Circle System Utilized Preoxygenation: Pre-oxygenation with 100% oxygen Induction Type: IV induction Ventilation: Mask ventilation without difficulty Laryngoscope Size: Mac and 3 Grade View: Grade I Tube type: Oral Tube size: 7.0 mm Number of attempts: 1 Airway Equipment and Method: Stylet and Oral airway Placement Confirmation: ETT inserted through vocal cords under direct vision, positive ETCO2 and breath sounds checked- equal and bilateral Secured at: 22 cm Tube secured with: Tape Dental Injury: Teeth and Oropharynx as per pre-operative assessment

## 2022-09-06 NOTE — Anesthesia Postprocedure Evaluation (Signed)
Anesthesia Post Note  Patient: PHOEBIE FUDGE  Procedure(s) Performed: TOTAL LAPAROSCOPIC HYSTERECTOMY WITH SALPINGECTOMY AND POSSIBLE OOPHORECTOMY (Bilateral: Abdomen) CYSTOSCOPY (Vagina )     Patient location during evaluation: PACU Anesthesia Type: General Level of consciousness: awake and alert and oriented Pain management: pain level controlled Vital Signs Assessment: post-procedure vital signs reviewed and stable Respiratory status: spontaneous breathing, nonlabored ventilation and respiratory function stable Cardiovascular status: blood pressure returned to baseline and stable Postop Assessment: no apparent nausea or vomiting Anesthetic complications: no   No notable events documented.  Last Vitals:  Vitals:   09/06/22 1552 09/06/22 1613  BP: (!) 163/96 (!) 172/97  Pulse: 82 83  Resp: 18 (!) 22  Temp: 36.8 C 36.7 C  SpO2: 96% 96%    Last Pain:  Vitals:   09/06/22 1613  TempSrc:   PainSc: 8                  Justinn Welter A.

## 2022-09-07 ENCOUNTER — Encounter (HOSPITAL_BASED_OUTPATIENT_CLINIC_OR_DEPARTMENT_OTHER): Payer: Self-pay | Admitting: Obstetrics & Gynecology

## 2022-09-07 ENCOUNTER — Inpatient Hospital Stay: Payer: Medicaid Other | Admitting: Physician Assistant

## 2022-09-07 ENCOUNTER — Other Ambulatory Visit: Payer: Self-pay

## 2022-09-07 DIAGNOSIS — N920 Excessive and frequent menstruation with regular cycle: Secondary | ICD-10-CM | POA: Diagnosis not present

## 2022-09-07 DIAGNOSIS — N840 Polyp of corpus uteri: Secondary | ICD-10-CM

## 2022-09-07 DIAGNOSIS — M2559 Pain in other specified joint: Secondary | ICD-10-CM

## 2022-09-07 DIAGNOSIS — D259 Leiomyoma of uterus, unspecified: Secondary | ICD-10-CM | POA: Diagnosis not present

## 2022-09-07 DIAGNOSIS — D251 Intramural leiomyoma of uterus: Secondary | ICD-10-CM

## 2022-09-07 LAB — SURGICAL PATHOLOGY

## 2022-09-07 MED ORDER — METHOCARBAMOL 750 MG PO TABS: 750.0000 mg | ORAL_TABLET | Freq: Four times a day (QID) | ORAL | 0 refills | Status: DC | PRN

## 2022-09-07 MED ORDER — GABAPENTIN 100 MG PO CAPS
ORAL_CAPSULE | ORAL | Status: AC
  Filled 2022-09-07: qty 1

## 2022-09-07 MED ORDER — METOPROLOL SUCCINATE ER 25 MG PO TB24
25.0000 mg | ORAL_TABLET | Freq: Every day | ORAL | Status: DC
  Administered 2022-09-07: 25 mg via ORAL
  Filled 2022-09-07: qty 1

## 2022-09-07 MED ORDER — GABAPENTIN 100 MG PO CAPS
200.0000 mg | ORAL_CAPSULE | Freq: Once | ORAL | Status: AC
  Administered 2022-09-07: 200 mg via ORAL

## 2022-09-07 MED ORDER — AMLODIPINE BESYLATE 10 MG PO TABS
10.0000 mg | ORAL_TABLET | Freq: Every day | ORAL | Status: DC
  Administered 2022-09-07: 10 mg via ORAL
  Filled 2022-09-07: qty 1

## 2022-09-07 MED ORDER — GABAPENTIN 100 MG PO CAPS
ORAL_CAPSULE | ORAL | Status: AC
  Filled 2022-09-07: qty 2

## 2022-09-07 MED ORDER — OXYCODONE-ACETAMINOPHEN 5-325 MG PO TABS
ORAL_TABLET | ORAL | Status: AC
  Filled 2022-09-07: qty 2

## 2022-09-07 NOTE — Discharge Instructions (Addendum)

## 2022-09-07 NOTE — Progress Notes (Signed)
1 Day Post-Op Procedure(s) (LRB): TOTAL LAPAROSCOPIC HYSTERECTOMY WITH SALPINGECTOMY AND POSSIBLE OOPHORECTOMY (Bilateral) CYSTOSCOPY (N/A)  Subjective: Patient reports some pain control issues overnight.  Reviewed medication and dosages for when she is at home.  Denies nausea.  Eating.  Voiding well.  Walking.  Objective: I have reviewed patient's vital signs, intake and output, medications, and labs.  General: alert and no distress Resp: clear to auscultation bilaterally Cardio: regular rate and rhythm, S1, S2 normal, no murmur, click, rub or gallop GI: soft, non-tender; bowel sounds normal; no masses,  no organomegaly and incision: clean, dry, and intact Extremities: extremities normal, atraumatic, no cyanosis or edema Vaginal Bleeding: none  Assessment: s/p Procedure(s): TOTAL LAPAROSCOPIC HYSTERECTOMY WITH SALPINGECTOMY AND POSSIBLE OOPHORECTOMY (Bilateral) CYSTOSCOPY (N/A): stable and progressing well  Plan: Discharge home  LOS: 0 days    Megan Salon, MD 09/07/2022, 7:01 AM

## 2022-09-07 NOTE — Telephone Encounter (Signed)
Please advise KH 

## 2022-09-07 NOTE — Telephone Encounter (Signed)
From: Roderic Ovens To: Office of Fenton Foy, NP Sent: 09/07/2022 10:31 AM EDT Subject: Medication Renewal Request  Refills have been requested for the following medications:   methocarbamol (ROBAXIN) 750 MG tablet Kenney Houseman [address] [place]]  Preferred pharmacy: [id number] - [place], [place] - [address] AT [place] Delivery method: [employer]

## 2022-09-09 ENCOUNTER — Encounter (HOSPITAL_BASED_OUTPATIENT_CLINIC_OR_DEPARTMENT_OTHER): Payer: Self-pay | Admitting: Obstetrics & Gynecology

## 2022-09-09 ENCOUNTER — Encounter (HOSPITAL_BASED_OUTPATIENT_CLINIC_OR_DEPARTMENT_OTHER): Payer: Medicaid Other | Admitting: Obstetrics & Gynecology

## 2022-09-09 NOTE — Telephone Encounter (Signed)
Called pt in response to [employer]. Pt unable to come to office before closing for appt. Advised that the incisions do not appear to be separated. Advised pt to apply bandage to act as a pressure dressing. Advised that if she notices that the incisions become red, start to drain green or yellow drainage, or if she develops a fever, she should call on call provider. Pt reports that she has enough pain medication to control the pain. Pt provided with post op appt next week.

## 2022-09-12 ENCOUNTER — Ambulatory Visit: Payer: Self-pay | Admitting: Nurse Practitioner

## 2022-09-15 ENCOUNTER — Encounter (HOSPITAL_BASED_OUTPATIENT_CLINIC_OR_DEPARTMENT_OTHER): Payer: Self-pay | Admitting: Obstetrics & Gynecology

## 2022-09-15 ENCOUNTER — Ambulatory Visit (INDEPENDENT_AMBULATORY_CARE_PROVIDER_SITE_OTHER): Payer: Medicaid Other | Admitting: Obstetrics & Gynecology

## 2022-09-15 VITALS — BP 118/89 | HR 56 | Ht 61.5 in | Wt 150.6 lb

## 2022-09-15 DIAGNOSIS — Z9889 Other specified postprocedural states: Secondary | ICD-10-CM

## 2022-09-15 DIAGNOSIS — G8918 Other acute postprocedural pain: Secondary | ICD-10-CM

## 2022-09-15 DIAGNOSIS — M2559 Pain in other specified joint: Secondary | ICD-10-CM

## 2022-09-15 MED ORDER — METHOCARBAMOL 750 MG PO TABS: 750.0000 mg | ORAL_TABLET | Freq: Four times a day (QID) | ORAL | 0 refills | Status: DC | PRN

## 2022-09-15 MED ORDER — OXYCODONE-ACETAMINOPHEN 5-325 MG PO TABS: 2.0000 | ORAL_TABLET | ORAL | 0 refills | Status: DC | PRN

## 2022-09-15 MED ORDER — OXYCODONE-ACETAMINOPHEN 5-325 MG PO TABS: 2.0000 | ORAL_TABLET | ORAL | 0 refills | Status: AC | PRN

## 2022-09-15 MED ORDER — GABAPENTIN 300 MG PO CAPS: 300.0000 mg | ORAL_CAPSULE | Freq: Three times a day (TID) | ORAL | 1 refills | Status: DC | PRN

## 2022-09-15 NOTE — Progress Notes (Deleted)
[facility] OFFICE PROGRESS NOTE  Fenton Foy, NP [place] [address]  DIAGNOSIS:  Iron deficiency anemia secondary to menorrhagia with intolerance to the oral iron tablets.   PRIOR THERAPY: None  CURRENT THERAPY: Iron infusion with Venofer 300 Mg IV weekly for 3 weeks. Most recent dose on 04/25/22  INTERVAL HISTORY: AZILE ANTONOVICH 42 y.o. female returns to clinic for a 48-month follow-up visit.  The patient is followed by the clinic for history of iron deficiency anemia secondary to menorrhagia.  She recently underwent a hysterectomy on 09/06/22.  She tolerated the procedure well.  She had a 1 week postop visit with her OB/GYN last week and her hemoglobin was normal at 12 point***.  Regarding her anemia, she has intolerance to oral iron supplements that she receives to IV iron with Venofer as needed.  Her most recent dose was on 04/25/2022.  Since last being seen she did not denies any worsening symptoms of anemia.  Her fatigue is ***.  Dizzy spells?  Denies any chest pain, shortness of breath, cough, or hemoptysis.  Denies any other abnormal bleeding or bruising.  She is here today for evaluation and repeat blood work     MEDICAL HISTORY: Past Medical History:  Diagnosis Date   Anemia 2023   Follows w/ Dr. Julien Nordmann [facility]. s/p iron infusions   Arthritis    back   Asthma    mild   Bronchitis    Chronic low back pain    Follows w/ ortho, Dr. Frankey Shown. w/ bilateral sciatica   COVID-19 06/28/2019   Depression    hx of   GERD (gastroesophageal reflux disease)    Follows w/ PCP.   Heart murmur    Heart palpitations    Follows w/ PCP. Patient is taking a beta blocker.   Hypertension    Follows with PCP, Lazaro Arms, NP.   Insomnia    Migraines    Follows w/ PCP.   Pancreatitis 2015   Perimenopausal    PID (acute pelvic inflammatory disease) 2010   Pregnancy induced hypertension    Preterm labor    PTSD  (post-traumatic stress disorder)    Radiculopathy of cervical spine    Follows w/ ortho, Dr. Frankey Shown.   Seasonal allergies    Sickle cell trait (HCC)    Wears glasses     ALLERGIES:  is allergic to aloe vera, aspirin, hydromorphone hcl, morphine, shrimp [shellfish allergy], and dust mite extract.  MEDICATIONS:  Current Outpatient Medications  Medication Sig Dispense Refill   albuterol (PROVENTIL) (2.5 MG/3ML) 0.083% nebulizer solution Take 2.5 mg by nebulization every 6 (six) hours as needed for wheezing or shortness of breath.     amLODipine (NORVASC) 10 MG tablet TAKE 1 TABLET BY MOUTH EVERY DAY 90 tablet 0   gabapentin (NEURONTIN) 300 MG capsule Take 1 capsule (300 mg total) by mouth 3 (three) times daily as needed. 90 capsule 1   methocarbamol (ROBAXIN) 750 MG tablet Take 1 tablet (750 mg total) by mouth every 6 (six) hours as needed for muscle spasms. 30 tablet 0   metoprolol succinate (TOPROL-XL) 25 MG 24 hr tablet Take 1 tablet (25 mg total) by mouth daily. 90 tablet 3   nystatin ointment (MYCOSTATIN) Apply 1 application  topically daily as needed (rash).     oxyCODONE-acetaminophen (PERCOCET) 5-325 MG tablet Take 2 tablets by mouth every 4 (four) hours as needed for up  to 5 days. use only as much as needed to relieve pain 10 tablet 0   pantoprazole (PROTONIX) 40 MG tablet TAKE 1 TABLET BY MOUTH EVERY DAY 90 tablet 1   No current facility-administered medications for this visit.    SURGICAL HISTORY:  Past Surgical History:  Procedure Laterality Date   ANTERIOR CERVICAL DECOMP/DISCECTOMY FUSION N/A 10/25/2021   Procedure: Anterior Cervical Decompression Fusion - Cervical five-Cervical six - Cervical six-Cervical seven - Cervical seven-Thoracic one;  Surgeon: Earnie Larsson, MD;  Location: [facility];  Service: Neurosurgery;  Laterality: N/A;   CESAREAN SECTION     x 2   CHOLECYSTECTOMY N/A 08/27/2013   Procedure: LAPAROSCOPIC CHOLECYSTECTOMY WITH INTRAOPERATIVE CHOLANGIOGRAM;   Surgeon: Pedro Earls, MD;  Location: [facility];  Service: General;  Laterality: N/A;   CYSTOSCOPY N/A 09/06/2022   Procedure: CYSTOSCOPY;  Surgeon: Megan Salon, MD;  Location: [facility];  Service: Gynecology;  Laterality: N/A;   DILATION AND CURETTAGE OF UTERUS     2010 and around 2018   OVARY SURGERY  2004   2004   TOTAL LAPAROSCOPIC HYSTERECTOMY WITH SALPINGECTOMY Bilateral 09/06/2022   Procedure: TOTAL LAPAROSCOPIC HYSTERECTOMY WITH SALPINGECTOMY AND POSSIBLE OOPHORECTOMY;  Surgeon: Megan Salon, MD;  Location: [facility];  Service: Gynecology;  Laterality: Bilateral;   TUBAL LIGATION  2012    REVIEW OF SYSTEMS:   Review of Systems  Constitutional: Negative for appetite change, chills, fatigue, fever and unexpected weight change.  HENT:   Negative for mouth sores, nosebleeds, sore throat and trouble swallowing.   Eyes: Negative for eye problems and icterus.  Respiratory: Negative for cough, hemoptysis, shortness of breath and wheezing.   Cardiovascular: Negative for chest pain and leg swelling.  Gastrointestinal: Negative for abdominal pain, constipation, diarrhea, nausea and vomiting.  Genitourinary: Negative for bladder incontinence, difficulty urinating, dysuria, frequency and hematuria.   Musculoskeletal: Negative for back pain, gait problem, neck pain and neck stiffness.  Skin: Negative for itching and rash.  Neurological: Negative for dizziness, extremity weakness, gait problem, headaches, light-headedness and seizures.  Hematological: Negative for adenopathy. Does not bruise/bleed easily.  Psychiatric/Behavioral: Negative for confusion, depression and sleep disturbance. The patient is not nervous/anxious.     PHYSICAL EXAMINATION:  Last menstrual period 08/25/2022.  ECOG PERFORMANCE STATUS: {CHL ONC ECOG [id number]  Physical Exam  Constitutional: Oriented to person, place, and time and well-developed, well-nourished, and in no  distress. No distress.  HENT:  Head: Normocephalic and atraumatic.  Mouth/Throat: Oropharynx is clear and moist. No oropharyngeal exudate.  Eyes: Conjunctivae are normal. Right eye exhibits no discharge. Left eye exhibits no discharge. No scleral icterus.  Neck: Normal range of motion. Neck supple.  Cardiovascular: Normal rate, regular rhythm, normal heart sounds and intact distal pulses.   Pulmonary/Chest: Effort normal and breath sounds normal. No respiratory distress. No wheezes. No rales.  Abdominal: Soft. Bowel sounds are normal. Exhibits no distension and no mass. There is no tenderness.  Musculoskeletal: Normal range of motion. Exhibits no edema.  Lymphadenopathy:    No cervical adenopathy.  Neurological: Alert and oriented to person, place, and time. Exhibits normal muscle tone. Gait normal. Coordination normal.  Skin: Skin is warm and dry. No rash noted. Not diaphoretic. No erythema. No pallor.  Psychiatric: Mood, memory and judgment normal.  Vitals reviewed.  LABORATORY DATA: Lab Results  Component Value Date   WBC 6.4 09/06/2022   HGB 12.6 09/06/2022   HCT 37.1 09/06/2022   MCV 94.2 09/06/2022  PLT 416 (H) 09/06/2022      Chemistry      Component Value Date/Time   NA 138 09/06/2022 1004   NA 138 03/14/2022 1424   K 3.4 (L) 09/06/2022 1004   CL 108 09/06/2022 1004   CO2 23 09/06/2022 1004   BUN 13 09/06/2022 1004   BUN 15 03/14/2022 1424   CREATININE 0.71 09/06/2022 1004   CREATININE 0.70 03/31/2022 1321   CREATININE 0.66 03/03/2014 1105      Component Value Date/Time   CALCIUM 8.6 (L) 09/06/2022 1004   ALKPHOS 93 03/31/2022 1321   AST 26 03/31/2022 1321   ALT 14 03/31/2022 1321   BILITOT 0.6 03/31/2022 1321       RADIOGRAPHIC STUDIES:  No results found.   ASSESSMENT/PLAN:  This is a very pleasant 42 year old African-American female with iron deficiency anemia secondary to menstrual blood loss and inadequate oral iron supplements due to  intolerance.  She receives IV iron with Venofer 300 mg as needed.  Her most recent dose was in November 2023.  She recently underwent hysterectomy on 09/06/2022.  She tolerated this well.  The patient had repeat CBC, iron studies, ferritin drawn today.  Her labs show ***.  Her iron studies are pending at this time.  Should she continue to show significant iron deficiency on labs, we will arrange for IV iron.  Otherwise, we recommend she continue on observation.  We will see her back for follow-up visit and repeat lab work in 3 months.  The patient was advised to call immediately if she has any concerning symptoms in the interval. The patient voices understanding of current disease status and treatment options and is in agreement with the current care plan. All questions were answered. The patient knows to call the clinic with any problems, questions or concerns. We can certainly see the patient much sooner if necessary   No orders of the defined types were placed in this encounter.    I spent {CHL ONC TIME VISIT - [id number] counseling the patient face to face. The total time spent in the appointment was {CHL ONC TIME VISIT - [id number].  Sung Parodi L Arliene Rosenow, PA-C 09/15/22

## 2022-09-15 NOTE — Progress Notes (Signed)
GYNECOLOGY  VISIT  CC:   post op recheck  HPI: 42 y.o. [id number] Significant Other Black or African American female here for recheck after undergoing TLH/bilateral salpingectomy/cystoscopy on 09/06/2022.  She reports bleeding is none.  She has some cramping and some pain in the morning.  Feeling gabapentin is really helping the best.  Needs RF.  Dosage discussed and will increase to 300mg .  Bowel function is  normalizing.  Had some diarrhea and then constipation after surgery.  Now having one every other day .  Bladder function is normal.    Pathology reviewed:  Yes .  Questions answered.    Hb post op was 12.6.  Does not need to be rechecked.  Has appt with heme/onc in later April.    MEDS:   Current Outpatient Medications on File Prior to Visit  Medication Sig Dispense Refill   albuterol (PROVENTIL) (2.5 MG/3ML) 0.083% nebulizer solution Take 2.5 mg by nebulization every 6 (six) hours as needed for wheezing or shortness of breath.     amLODipine (NORVASC) 10 MG tablet TAKE 1 TABLET BY MOUTH EVERY DAY 90 tablet 0   metoprolol succinate (TOPROL-XL) 25 MG 24 hr tablet Take 1 tablet (25 mg total) by mouth daily. 90 tablet 3   nystatin ointment (MYCOSTATIN) Apply 1 application  topically daily as needed (rash).     pantoprazole (PROTONIX) 40 MG tablet TAKE 1 TABLET BY MOUTH EVERY DAY 90 tablet 1   No current facility-administered medications on file prior to visit.    SH:  Smoking Yes    PHYSICAL EXAMINATION:    BP 118/89   Pulse (!) 56   Ht 5' 1.5" (1.562 m)   Wt 150 lb 9.6 oz (68.3 kg)   LMP 08/25/2022   BMI 27.99 kg/m     General appearance: alert, cooperative and appears stated age CV:  Regular rate and rhythm Lungs:  clear to auscultation, no wheezes, rales or rhonchi, symmetric air entry Abdomen: soft, non-tender; bowel sounds normal; no masses,  no organomegaly Incisions:  C/D/I  Pelvic: deferred  Assessment/Plan: 1. Post-operative state - post op instructions given -  plan to recheck 3-4 weeks  2. Pain in other joint - methocarbamol (ROBAXIN) 750 MG tablet; Take 1 tablet (750 mg total) by mouth every 6 (six) hours as needed for muscle spasms.  Dispense: 30 tablet; Refill: 0  3. Post-op pain - gabapentin (NEURONTIN) 300 MG capsule; Take 1 capsule (300 mg total) by mouth 3 (three) times daily as needed.  Dispense: 90 capsule; Refill: 1 - oxyCODONE-acetaminophen (PERCOCET) 5-325 MG tablet; Take 2 tablets by mouth every 4 (four) hours as needed for up to 5 days. use only as much as needed to relieve pain  Dispense: 10 tablet; Refill: 0

## 2022-09-19 ENCOUNTER — Inpatient Hospital Stay: Payer: Medicaid Other | Attending: Physician Assistant | Admitting: Physician Assistant

## 2022-09-19 ENCOUNTER — Other Ambulatory Visit: Payer: Medicaid Other

## 2022-09-19 ENCOUNTER — Other Ambulatory Visit: Payer: Self-pay | Admitting: Physician Assistant

## 2022-09-19 DIAGNOSIS — D509 Iron deficiency anemia, unspecified: Secondary | ICD-10-CM

## 2022-09-19 DIAGNOSIS — N920 Excessive and frequent menstruation with regular cycle: Secondary | ICD-10-CM | POA: Insufficient documentation

## 2022-09-19 DIAGNOSIS — D500 Iron deficiency anemia secondary to blood loss (chronic): Secondary | ICD-10-CM | POA: Insufficient documentation

## 2022-09-20 ENCOUNTER — Telehealth: Payer: Self-pay | Admitting: Physician Assistant

## 2022-09-20 NOTE — Telephone Encounter (Signed)
Called patient per 4/1 IB to reschedule missed appointment. Patient rescheduled and notified.

## 2022-09-30 NOTE — Progress Notes (Unsigned)
[facility] OFFICE PROGRESS NOTE  Ivonne Andrew, NP [phone] [address] [place] [address]  DIAGNOSIS: :  Iron deficiency anemia secondary to menorrhagia with intolerance to the oral iron tablets.   PRIOR THERAPY: None  CURRENT THERAPY: : Iron infusion with Venofer 300 Mg IV weekly for 3 weeks. Most recent dose on 04/25/22.   INTERVAL HISTORY: Kayla Dunn 42 y.o. female returns returns to clinic for a 11-month follow-up visit.  The patient is followed by the clinic for history of iron deficiency anemia secondary to menorrhagia.  She recently underwent a hysterectomy on 09/06/22.  She tolerated the procedure well. She is pleased with how quick her recovery has been. She had a 1 week postop visit with her OB/GYN recently hemoglobin was normal. She is following up again on 10/17/22.  Regarding her anemia, she has intolerance to oral iron supplements that she receives to IV iron with Venofer as needed.  Her most recent dose was on 04/25/2022.  Since last being seen she did not denies any worsening symptoms of anemia.  She has good energy.  Denies any chest pain, shortness of breath, cough, or hemoptysis.  Denies any other abnormal bleeding or bruising. She also mentions she sees her PCP later this week. She is here today for evaluation and repeat blood work   MEDICAL HISTORY: Past Medical History:  Diagnosis Date   Anemia 2023   Follows w/ Dr. Arbutus Ped [facility]. s/p iron infusions   Arthritis    back   Asthma    mild   Bronchitis    Chronic low back pain    Follows w/ ortho, Dr. Gershon Mussel. w/ bilateral sciatica   COVID-19 06/28/2019   Depression    hx of   GERD (gastroesophageal reflux disease)    Follows w/ PCP.   Heart murmur    Heart palpitations    Follows w/ PCP. Patient is taking a beta blocker.   Hypertension    Follows with PCP, Angus Seller, NP.   Insomnia    Migraines    Follows w/ PCP.   Pancreatitis 2015   Perimenopausal     PID (acute pelvic inflammatory disease) 2010   Pregnancy induced hypertension    Preterm labor    PTSD (post-traumatic stress disorder)    Radiculopathy of cervical spine    Follows w/ ortho, Dr. Gershon Mussel.   Seasonal allergies    Sickle cell trait (HCC)    Wears glasses     ALLERGIES:  is allergic to aloe vera, aspirin, hydromorphone hcl, morphine, shrimp [shellfish allergy], and dust mite extract.  MEDICATIONS:  Current Outpatient Medications  Medication Sig Dispense Refill   albuterol (PROVENTIL) (2.5 MG/3ML) 0.083% nebulizer solution Take 2.5 mg by nebulization every 6 (six) hours as needed for wheezing or shortness of breath.     amLODipine (NORVASC) 10 MG tablet TAKE 1 TABLET BY MOUTH EVERY DAY 90 tablet 0   gabapentin (NEURONTIN) 300 MG capsule Take 1 capsule (300 mg total) by mouth 3 (three) times daily as needed. 90 capsule 1   methocarbamol (ROBAXIN) 750 MG tablet Take 1 tablet (750 mg total) by mouth every 6 (six) hours as needed for muscle spasms. 30 tablet 0   metoprolol succinate (TOPROL-XL) 25 MG 24 hr tablet Take 1 tablet (25 mg total) by mouth daily. 90 tablet 3   nystatin ointment (MYCOSTATIN) Apply 1 application  topically daily as needed (rash).  pantoprazole (PROTONIX) 40 MG tablet TAKE 1 TABLET BY MOUTH EVERY DAY 90 tablet 1   No current facility-administered medications for this visit.    SURGICAL HISTORY:  Past Surgical History:  Procedure Laterality Date   ANTERIOR CERVICAL DECOMP/DISCECTOMY FUSION N/A 10/25/2021   Procedure: Anterior Cervical Decompression Fusion - Cervical five-Cervical six - Cervical six-Cervical seven - Cervical seven-Thoracic one;  Surgeon: Julio Sicks, MD;  Location: [facility];  Service: Neurosurgery;  Laterality: N/A;   CESAREAN SECTION     x 2   CHOLECYSTECTOMY N/A 08/27/2013   Procedure: LAPAROSCOPIC CHOLECYSTECTOMY WITH INTRAOPERATIVE CHOLANGIOGRAM;  Surgeon: Valarie Merino, MD;  Location: [facility];  Service: General;   Laterality: N/A;   CYSTOSCOPY N/A 09/06/2022   Procedure: CYSTOSCOPY;  Surgeon: Jerene Bears, MD;  Location: [facility];  Service: Gynecology;  Laterality: N/A;   DILATION AND CURETTAGE OF UTERUS     2010 and around 2018   OVARY SURGERY  2004   2004   TOTAL LAPAROSCOPIC HYSTERECTOMY WITH SALPINGECTOMY Bilateral 09/06/2022   Procedure: TOTAL LAPAROSCOPIC HYSTERECTOMY WITH SALPINGECTOMY AND POSSIBLE OOPHORECTOMY;  Surgeon: Jerene Bears, MD;  Location: [facility];  Service: Gynecology;  Laterality: Bilateral;   TUBAL LIGATION  2012    REVIEW OF SYSTEMS:   Review of Systems  Constitutional: Negative for appetite change, chills, fatigue, fever and unexpected weight change.  HENT: Negative for mouth sores, nosebleeds, sore throat and trouble swallowing.   Eyes: Negative for eye problems and icterus.  Respiratory: Negative for cough, hemoptysis, shortness of breath and wheezing.   Cardiovascular: Negative for chest pain and leg swelling.  Gastrointestinal: Negative for abdominal pain, constipation, diarrhea, nausea and vomiting.  Genitourinary: Negative for bladder incontinence, difficulty urinating, dysuria, frequency and hematuria.   Musculoskeletal: Negative for back pain, gait problem, neck pain and neck stiffness.  Skin: Negative for itching and rash.  Neurological: Negative for dizziness, extremity weakness, gait problem, headaches, light-headedness and seizures.  Hematological: Negative for adenopathy. Does not bruise/bleed easily.  Psychiatric/Behavioral: Negative for confusion, depression and sleep disturbance. The patient is not nervous/anxious.     PHYSICAL EXAMINATION:  Last menstrual period 08/25/2022.  ECOG PERFORMANCE STATUS: 0  Physical Exam  Constitutional: Oriented to person, place, and time and well-developed, well-nourished, and in no distress.  HENT:  Head: Normocephalic and atraumatic.  Mouth/Throat: Oropharynx is clear and moist.  No oropharyngeal exudate.  Eyes: Conjunctivae are normal. Right eye exhibits no discharge. Left eye exhibits no discharge. No scleral icterus.  Neck: Normal range of motion. Neck supple.  Cardiovascular: Normal rate, regular rhythm, normal heart sounds and intact distal pulses.   Pulmonary/Chest: Effort normal and breath sounds normal. No respiratory distress. No wheezes. No rales.  Abdominal: Soft. Bowel sounds are normal. Exhibits no distension and no mass. There is no tenderness.  Musculoskeletal: Normal range of motion. Exhibits no edema.  Lymphadenopathy:    No cervical adenopathy.  Neurological: Alert and oriented to person, place, and time. Exhibits normal muscle tone. Gait normal. Coordination normal.  Skin: Skin is warm and dry. No rash noted. Not diaphoretic. No erythema. No pallor.  Psychiatric: Mood, memory and judgment normal.  Vitals reviewed.  LABORATORY DATA: Lab Results  Component Value Date   WBC 6.4 09/06/2022   HGB 12.6 09/06/2022   HCT 37.1 09/06/2022   MCV 94.2 09/06/2022   PLT 416 (H) 09/06/2022      Chemistry      Component Value Date/Time   NA 138 09/06/2022 1004  NA 138 03/14/2022 1424   K 3.4 (L) 09/06/2022 1004   CL 108 09/06/2022 1004   CO2 23 09/06/2022 1004   BUN 13 09/06/2022 1004   BUN 15 03/14/2022 1424   CREATININE 0.71 09/06/2022 1004   CREATININE 0.70 03/31/2022 1321   CREATININE 0.66 03/03/2014 1105      Component Value Date/Time   CALCIUM 8.6 (L) 09/06/2022 1004   ALKPHOS 93 03/31/2022 1321   AST 26 03/31/2022 1321   ALT 14 03/31/2022 1321   BILITOT 0.6 03/31/2022 1321       RADIOGRAPHIC STUDIES:  No results found.   ASSESSMENT/PLAN:  This is a very pleasant 42 year old African-American female with iron deficiency anemia secondary to menstrual blood loss and inadequate oral iron supplements due to intolerance.  She receives IV iron with Venofer 300 mg as needed.  Her most recent dose was in November 2023.  She recently  underwent hysterectomy on 09/06/2022.  She tolerated this well.  The patient had repeat CBC, iron studies, ferritin drawn today.  Her labs show normal CBC.  Her iron studies are pending at this time.  Should she continue to show significant iron deficiency on labs, we will arrange for IV iron.  Otherwise, we recommend she continue on observation.  I will arrange follow up in 3-6 months based on her pending iron studies. Someone from scheduling will be in touch with her later this week to schedule follow up.   The patient was advised to call immediately if she has any concerning symptoms in the interval. The patient voices understanding of current disease status and treatment options and is in agreement with the current care plan. All questions were answered. The patient knows to call the clinic with any problems, questions or concerns. We can certainly see the patient much sooner if necessary  No orders of the defined types were placed in this encounter.    The total time spent in the appointment was 20-29 minutes  Arrabella Westerman L Kylani Wires, PA-C 09/30/22

## 2022-10-04 ENCOUNTER — Other Ambulatory Visit: Payer: Self-pay | Admitting: Physician Assistant

## 2022-10-04 DIAGNOSIS — Z862 Personal history of diseases of the blood and blood-forming organs and certain disorders involving the immune mechanism: Secondary | ICD-10-CM

## 2022-10-05 ENCOUNTER — Inpatient Hospital Stay: Payer: Medicaid Other | Admitting: Physician Assistant

## 2022-10-05 ENCOUNTER — Inpatient Hospital Stay: Payer: Medicaid Other

## 2022-10-05 VITALS — BP 129/77 | HR 73 | Temp 98.6°F | Resp 14 | Ht 61.5 in | Wt 145.5 lb

## 2022-10-05 DIAGNOSIS — Z862 Personal history of diseases of the blood and blood-forming organs and certain disorders involving the immune mechanism: Secondary | ICD-10-CM | POA: Diagnosis not present

## 2022-10-05 DIAGNOSIS — N920 Excessive and frequent menstruation with regular cycle: Secondary | ICD-10-CM | POA: Diagnosis present

## 2022-10-05 DIAGNOSIS — D500 Iron deficiency anemia secondary to blood loss (chronic): Secondary | ICD-10-CM | POA: Diagnosis present

## 2022-10-05 DIAGNOSIS — D509 Iron deficiency anemia, unspecified: Secondary | ICD-10-CM

## 2022-10-05 LAB — CBC WITH DIFFERENTIAL (CANCER CENTER ONLY)
Abs Immature Granulocytes: 0.01 10*3/uL (ref 0.00–0.07)
Basophils Absolute: 0.1 10*3/uL (ref 0.0–0.1)
Basophils Relative: 1 %
Eosinophils Absolute: 0.3 10*3/uL (ref 0.0–0.5)
Eosinophils Relative: 5 %
HCT: 36.5 % (ref 36.0–46.0)
Hemoglobin: 12.1 g/dL (ref 12.0–15.0)
Immature Granulocytes: 0 %
Lymphocytes Relative: 36 %
Lymphs Abs: 2.2 10*3/uL (ref 0.7–4.0)
MCH: 30.3 pg (ref 26.0–34.0)
MCHC: 33.2 g/dL (ref 30.0–36.0)
MCV: 91.3 fL (ref 80.0–100.0)
Monocytes Absolute: 0.7 10*3/uL (ref 0.1–1.0)
Monocytes Relative: 11 %
Neutro Abs: 3 10*3/uL (ref 1.7–7.7)
Neutrophils Relative %: 47 %
Platelet Count: 357 10*3/uL (ref 150–400)
RBC: 4 MIL/uL (ref 3.87–5.11)
RDW: 12.8 % (ref 11.5–15.5)
WBC Count: 6.3 10*3/uL (ref 4.0–10.5)
nRBC: 0 % (ref 0.0–0.2)

## 2022-10-05 LAB — IRON AND IRON BINDING CAPACITY (CC-WL,HP ONLY)
Iron: 68 ug/dL (ref 28–170)
Saturation Ratios: 15 % (ref 10.4–31.8)
TIBC: 448 ug/dL (ref 250–450)
UIBC: 380 ug/dL (ref 148–442)

## 2022-10-06 ENCOUNTER — Encounter (HOSPITAL_BASED_OUTPATIENT_CLINIC_OR_DEPARTMENT_OTHER): Payer: Self-pay | Admitting: Obstetrics & Gynecology

## 2022-10-06 ENCOUNTER — Telehealth: Payer: Self-pay | Admitting: Internal Medicine

## 2022-10-06 ENCOUNTER — Telehealth: Payer: Self-pay | Admitting: Physician Assistant

## 2022-10-06 LAB — FERRITIN: Ferritin: 13 ng/mL (ref 11–307)

## 2022-10-06 NOTE — Telephone Encounter (Signed)
I called the patient to review her lab results from yesterday. Her iron studies and CBC are normal. Her ferritin is on the low end of normal. We will continue to monitor for now and see her back in about 4 months. She is in agreement with this plan. I have sent a scheduling message to arrange follow up

## 2022-10-06 NOTE — Telephone Encounter (Signed)
Scheduled per inbasket message, patient has been called and notified. 

## 2022-10-13 ENCOUNTER — Encounter: Payer: Self-pay | Admitting: Nurse Practitioner

## 2022-10-13 ENCOUNTER — Ambulatory Visit (INDEPENDENT_AMBULATORY_CARE_PROVIDER_SITE_OTHER): Payer: Medicaid Other | Admitting: Nurse Practitioner

## 2022-10-13 VITALS — BP 135/72 | HR 46 | Temp 97.8°F | Ht 61.5 in | Wt 148.8 lb

## 2022-10-13 DIAGNOSIS — J302 Other seasonal allergic rhinitis: Secondary | ICD-10-CM | POA: Diagnosis not present

## 2022-10-13 MED ORDER — MONTELUKAST SODIUM 10 MG PO TABS: 10.0000 mg | ORAL_TABLET | Freq: Every day | ORAL | 3 refills | Status: DC

## 2022-10-13 MED ORDER — ALBUTEROL SULFATE (2.5 MG/3ML) 0.083% IN NEBU: 2.5000 mg | INHALATION_SOLUTION | Freq: Four times a day (QID) | RESPIRATORY_TRACT | 1 refills | Status: DC | PRN

## 2022-10-13 MED ORDER — FLUTICASONE PROPIONATE 50 MCG/ACT NA SUSP: 2.0000 | Freq: Every day | NASAL | 6 refills | Status: DC

## 2022-10-13 NOTE — Assessment & Plan Note (Signed)
-   albuterol (PROVENTIL) (2.5 MG/3ML) 0.083% nebulizer solution; Take 3 mLs (2.5 mg total) by nebulization every 6 (six) hours as needed for wheezing or shortness of breath.  Dispense: 75 mL; Refill: 1 - fluticasone (FLONASE) 50 MCG/ACT nasal spray; Place 2 sprays into both nostrils daily.  Dispense: 16 g; Refill: 6 - montelukast (SINGULAIR) 10 MG tablet; Take 1 tablet (10 mg total) by mouth at bedtime.  Dispense: 30 tablet; Refill: 3   Follow up:  Follow up in 3 months

## 2022-10-13 NOTE — Progress Notes (Signed)
  ID: Kayla Dunn, female    DOB: 03/15/1981, 42 y.o.   MRN: [id number]  Chief Complaint  Patient presents with   Follow-up    Over all health     Referring provider: Ivonne Andrew, NP   HPI  Kayla Dunn 42 y.o. female  has a past medical history of Anemia, Arthritis, Bronchitis, Depression, GERD (gastroesophageal reflux disease), Heart murmur, Hypertension, Insomnia, Insomnia, Migraines, Pancreatitis, Perimenopausal, PID (acute pelvic inflammatory disease), Pregnancy induced hypertension, Preterm labor, PTSD (post-traumatic stress disorder), and Seasonal allergies.     Patient presents for follow-up visit today.  Patient states that things have been going well.  She does follow with hematology for anemia.  She states that this has improved since she had hysterectomy.  She did have recent labs with hematology.  Patient does complain today of seasonal allergies.  We will trial Flonase and Singulair.  Patient does need a refill on albuterol.  Denies f/c/s, n/v/d, hemoptysis, PND, leg swelling Denies chest pain or edema    Allergies  Allergen Reactions   Aloe Vera Hives and Itching   Aspirin Anaphylaxis   Hydromorphone Hcl Anaphylaxis   Morphine Hives   Shrimp [Shellfish Allergy] Swelling   Dust Mite Extract     Seasonal allergies     Immunization History  Administered Date(s) Administered   Influenza Whole 04/28/2010   Influenza,inj,Quad PF,6+ Mos 03/03/2014, 05/14/2018   Td 07/21/1997   Tdap 04/14/2022    Past Medical History:  Diagnosis Date   Anemia 2023   Follows w/ Dr. Arbutus [facility]. s/p iron infusions   Arthritis    back   Asthma    mild   Bronchitis    Chronic low back pain    Follows w/ ortho, Dr. Gershon Mussel. w/ bilateral sciatica   COVID-19 06/28/2019   Depression    hx of   GERD (gastroesophageal reflux disease)    Follows w/ PCP.   Heart murmur    Heart palpitations    Follows w/ PCP. Patient is taking a  beta blocker.   Hypertension    Follows with PCP, Angus Seller, NP.   Insomnia    Migraines    Follows w/ PCP.   Pancreatitis 2015   Perimenopausal    PID (acute pelvic inflammatory disease) 2010   Pregnancy induced hypertension    Preterm labor    PTSD (post-traumatic stress disorder)    Radiculopathy of cervical spine    Follows w/ ortho, Dr. Gershon Mussel.   Seasonal allergies    Sickle cell trait    Wears glasses     Tobacco History: Social History   Tobacco Use  Smoking Status Light Smoker   Packs/day: 0.25   Years: 12.00   Additional pack years: 0.00   Total pack years: 3.00   Types: Cigarettes  Smokeless Tobacco Never   Ready to quit: Not Answered Counseling given: Not Answered   Outpatient Encounter Medications as of 10/13/2022  Medication Sig   amLODipine (NORVASC) 10 MG tablet TAKE 1 TABLET BY MOUTH EVERY DAY   fluticasone (FLONASE) 50 MCG/ACT nasal spray Place 2 sprays into both nostrils daily.   gabapentin (NEURONTIN) 300 MG capsule Take 1 capsule (300 mg total) by mouth 3 (three) times daily as needed.   methocarbamol (ROBAXIN) 750 MG tablet Take 1 tablet (750 mg total) by mouth every 6 (six) hours as needed for muscle spasms.   metoprolol succinate (TOPROL-XL) 25 MG 24 hr tablet Take  1 tablet (25 mg total) by mouth daily.   montelukast (SINGULAIR) 10 MG tablet Take 1 tablet (10 mg total) by mouth at bedtime.   nystatin ointment (MYCOSTATIN) Apply 1 application  topically daily as needed (rash).   pantoprazole (PROTONIX) 40 MG tablet TAKE 1 TABLET BY MOUTH EVERY DAY   [DISCONTINUED] albuterol (PROVENTIL) (2.5 MG/3ML) 0.083% nebulizer solution Take 2.5 mg by nebulization every 6 (six) hours as needed for wheezing or shortness of breath.   albuterol (PROVENTIL) (2.5 MG/3ML) 0.083% nebulizer solution Take 3 mLs (2.5 mg total) by nebulization every 6 (six) hours as needed for wheezing or shortness of breath.   No facility-administered encounter medications on  file as of 10/13/2022.     Review of Systems  Review of Systems  Constitutional: Negative.   HENT:  Positive for congestion.   Respiratory:  Positive for wheezing.   Cardiovascular: Negative.   Gastrointestinal: Negative.   Allergic/Immunologic: Negative.   Neurological: Negative.   Psychiatric/Behavioral: Negative.         Physical Exam  BP 135/72   Pulse (!) 46   Temp 97.8 F (36.6 C)   Ht 5' 1.5" (1.562 m)   Wt 148 lb 12.8 oz (67.5 kg)   LMP 08/25/2022   SpO2 100%   BMI 27.66 kg/m   Wt Readings from Last 5 Encounters:  10/13/22 148 lb 12.8 oz (67.5 kg)  10/05/22 145 lb 8 oz (66 kg)  09/15/22 150 lb 9.6 oz (68.3 kg)  09/06/22 148 lb 9.6 oz (67.4 kg)  09/02/22 147 lb 6.4 oz (66.9 kg)     Physical Exam Vitals and nursing note reviewed.  Constitutional:      General: She is not in acute distress.    Appearance: She is well-developed.  Cardiovascular:     Rate and Rhythm: Normal rate and regular rhythm.  Pulmonary:     Effort: Pulmonary effort is normal.     Breath sounds: Normal breath sounds.  Neurological:     Mental Status: She is alert and oriented to person, place, and time.       Assessment & Plan:   Seasonal allergies - albuterol (PROVENTIL) (2.5 MG/3ML) 0.083% nebulizer solution; Take 3 mLs (2.5 mg total) by nebulization every 6 (six) hours as needed for wheezing or shortness of breath.  Dispense: 75 mL; Refill: 1 - fluticasone (FLONASE) 50 MCG/ACT nasal spray; Place 2 sprays into both nostrils daily.  Dispense: 16 g; Refill: 6 - montelukast (SINGULAIR) 10 MG tablet; Take 1 tablet (10 mg total) by mouth at bedtime.  Dispense: 30 tablet; Refill: 3   Follow up:  Follow up in 3 months     Ivonne Andrew, NP 10/13/2022

## 2022-10-13 NOTE — Patient Instructions (Signed)
1. Seasonal allergies  - albuterol (PROVENTIL) (2.5 MG/3ML) 0.083% nebulizer solution; Take 3 mLs (2.5 mg total) by nebulization every 6 (six) hours as needed for wheezing or shortness of breath.  Dispense: 75 mL; Refill: 1 - fluticasone (FLONASE) 50 MCG/ACT nasal spray; Place 2 sprays into both nostrils daily.  Dispense: 16 g; Refill: 6 - montelukast (SINGULAIR) 10 MG tablet; Take 1 tablet (10 mg total) by mouth at bedtime.  Dispense: 30 tablet; Refill: 3   Follow up:  Follow up in 3 months

## 2022-10-17 ENCOUNTER — Encounter (HOSPITAL_BASED_OUTPATIENT_CLINIC_OR_DEPARTMENT_OTHER): Payer: Self-pay | Admitting: Obstetrics & Gynecology

## 2022-10-17 ENCOUNTER — Ambulatory Visit (INDEPENDENT_AMBULATORY_CARE_PROVIDER_SITE_OTHER): Payer: Medicaid Other | Admitting: Obstetrics & Gynecology

## 2022-10-17 VITALS — BP 129/86 | HR 66 | Ht 61.5 in | Wt 147.2 lb

## 2022-10-17 DIAGNOSIS — R232 Flushing: Secondary | ICD-10-CM

## 2022-10-17 DIAGNOSIS — G8918 Other acute postprocedural pain: Secondary | ICD-10-CM

## 2022-10-17 DIAGNOSIS — Z9889 Other specified postprocedural states: Secondary | ICD-10-CM

## 2022-10-17 MED ORDER — GABAPENTIN 300 MG PO CAPS: 300.0000 mg | ORAL_CAPSULE | Freq: Every morning | ORAL | 4 refills | Status: DC

## 2022-10-20 NOTE — Progress Notes (Signed)
GYNECOLOGY  VISIT  CC:   post op recheck  HPI: 42 y.o. [id number] Significant Other Black or African American female here for recheck after undergoing TLH/bilateral salpingectomy/cystoscopy on 09/16/2022.  Admits to having intercourse this week without any problems.  Denies bleeding.  Reports she had no pain.  She was advised before surgery, again right before surgery, after surgery and in her post op paperwork to have pelvic rest for 12 weeks.  She felt it was safe to try.  Advised AGAIN of reasoning--cuff dehiscence and experience with prior patients who have had intercourse early with cuff dehiscence.  Strongly advised to continue with recommendations given and to not do this again until I've given the ok.  She reports bleeding is none.  She has no pain.  Bowel function is Normal.  Bladder function is normal.    Not using estradiol patch now.  Gabapentin has really helped.  Does need refill.   MEDS:   Current Outpatient Medications on File Prior to Visit  Medication Sig Dispense Refill   albuterol (PROVENTIL) (2.5 MG/3ML) 0.083% nebulizer solution Take 3 mLs (2.5 mg total) by nebulization every 6 (six) hours as needed for wheezing or shortness of breath. 75 mL 1   amLODipine (NORVASC) 10 MG tablet TAKE 1 TABLET BY MOUTH EVERY DAY 90 tablet 0   fluticasone (FLONASE) 50 MCG/ACT nasal spray Place 2 sprays into both nostrils daily. 16 g 6   methocarbamol (ROBAXIN) 750 MG tablet Take 1 tablet (750 mg total) by mouth every 6 (six) hours as needed for muscle spasms. 30 tablet 0   metoprolol succinate (TOPROL-XL) 25 MG 24 hr tablet Take 1 tablet (25 mg total) by mouth daily. 90 tablet 3   montelukast (SINGULAIR) 10 MG tablet Take 1 tablet (10 mg total) by mouth at bedtime. 30 tablet 3   nystatin ointment (MYCOSTATIN) Apply 1 application  topically daily as needed (rash).     pantoprazole (PROTONIX) 40 MG tablet TAKE 1 TABLET BY MOUTH EVERY DAY 90 tablet 1   No current facility-administered  medications on file prior to visit.    SH:  Smoking Yes    PHYSICAL EXAMINATION:    BP 129/86   Pulse 66   Ht 5' 1.5" (1.562 m)   Wt 147 lb 3.2 oz (66.8 kg)   LMP 08/25/2022   BMI 27.36 kg/m     General appearance: alert, cooperative and appears stated age Abdomen: soft, non-tender; bowel sounds normal; no masses,  no organomegaly Incisions:  C/D/I  Pelvic: External genitalia:  no lesions              Urethra:  normal appearing urethra with no masses, tenderness or lesions              Bartholins and Skenes: normal                 Vagina: normal appearing vagina with normal color and discharge, no lesions, cuff intact              Cervix: absent              Bimanual Exam:  Uterus:  uterus absent and non tender              Adnexa: no mass, fullness, tenderness  Chaperone, Raechel Ache, RN, was present for exam.  Assessment/Plan: 1. Post-operative state - pt highly encouraged to continue with pelvic rest.  Early intercourse and risks for cuff dehiscence discussed very clearly.  She does  voice understanding  2. Hot flashes - gabapentin (NEURONTIN) 300 MG capsule; Take 1 capsule (300 mg total) by mouth in the morning.  Dispense: 90 capsule; Refill: 4

## 2022-10-21 ENCOUNTER — Other Ambulatory Visit: Payer: Self-pay | Admitting: Nurse Practitioner

## 2022-10-21 DIAGNOSIS — I10 Essential (primary) hypertension: Secondary | ICD-10-CM

## 2022-11-07 ENCOUNTER — Other Ambulatory Visit (HOSPITAL_BASED_OUTPATIENT_CLINIC_OR_DEPARTMENT_OTHER): Payer: Self-pay | Admitting: Obstetrics & Gynecology

## 2022-11-07 DIAGNOSIS — M2559 Pain in other specified joint: Secondary | ICD-10-CM

## 2022-11-08 MED ORDER — METHOCARBAMOL 750 MG PO TABS: 750.0000 mg | ORAL_TABLET | Freq: Four times a day (QID) | ORAL | 0 refills | Status: DC | PRN

## 2022-12-13 ENCOUNTER — Ambulatory Visit (INDEPENDENT_AMBULATORY_CARE_PROVIDER_SITE_OTHER): Payer: Medicaid Other | Admitting: Obstetrics & Gynecology

## 2022-12-13 ENCOUNTER — Encounter (HOSPITAL_BASED_OUTPATIENT_CLINIC_OR_DEPARTMENT_OTHER): Payer: Self-pay | Admitting: Obstetrics & Gynecology

## 2022-12-13 VITALS — Ht 61.5 in

## 2022-12-13 DIAGNOSIS — Z1231 Encounter for screening mammogram for malignant neoplasm of breast: Secondary | ICD-10-CM

## 2022-12-13 DIAGNOSIS — Z9889 Other specified postprocedural states: Secondary | ICD-10-CM

## 2022-12-13 DIAGNOSIS — B977 Papillomavirus as the cause of diseases classified elsewhere: Secondary | ICD-10-CM

## 2022-12-13 NOTE — Progress Notes (Signed)
GYNECOLOGY  VISIT  CC:   post op recheck  HPI: 42 y.o. [id number] Significant Other Black or African American female here for recheck after undergoing TLH/bilateral salpingectomy/cystoscopy on 09/06/2022.  She reports bleeding is none.  She has no pain.  Bowel function is Normal.  Bladder function is normal.  She has had intercourse twice and had not had any issues.   MEDS:   Current Outpatient Medications on File Prior to Visit  Medication Sig Dispense Refill   albuterol (PROVENTIL) (2.5 MG/3ML) 0.083% nebulizer solution Take 3 mLs (2.5 mg total) by nebulization every 6 (six) hours as needed for wheezing or shortness of breath. 75 mL 1   amLODipine (NORVASC) 10 MG tablet TAKE 1 TABLET BY MOUTH EVERY DAY 90 tablet 0   fluticasone (FLONASE) 50 MCG/ACT nasal spray Place 2 sprays into both nostrils daily. 16 g 6   gabapentin (NEURONTIN) 300 MG capsule Take 1 capsule (300 mg total) by mouth in the morning. 90 capsule 4   methocarbamol (ROBAXIN) 750 MG tablet Take 1 tablet (750 mg total) by mouth every 6 (six) hours as needed for muscle spasms. 30 tablet 0   metoprolol succinate (TOPROL-XL) 25 MG 24 hr tablet Take 1 tablet (25 mg total) by mouth daily. 90 tablet 3   montelukast (SINGULAIR) 10 MG tablet Take 1 tablet (10 mg total) by mouth at bedtime. 30 tablet 3   nystatin ointment (MYCOSTATIN) Apply 1 application  topically daily as needed (rash).     pantoprazole (PROTONIX) 40 MG tablet TAKE 1 TABLET BY MOUTH EVERY DAY 90 tablet 1   No current facility-administered medications on file prior to visit.    SH:  Smoking No    PHYSICAL EXAMINATION:    Ht 5' 1.5" (1.562 m) Comment: Reported  LMP 08/25/2022   BMI 27.36 kg/m     General appearance: alert, cooperative and appears stated age Abdomen: soft, non-tender; bowel sounds normal; no masses,  no organomegaly Incisions:  C/D/I  Pelvic: External genitalia:  no lesions              Urethra:  normal appearing urethra with no masses,  tenderness or lesions              Bartholins and Skenes: normal                 Vagina: normal appearing vagina with normal color and discharge, no lesions              Cervix: absent              Bimanual Exam:  Uterus:  uterus absent              Adnexa: no mass, fullness, tenderness  Assessment/Plan: 1. Post-operative state - doing well.  Release for regular activity and sexual activity  2. Encounter for screening mammogram for malignant neoplasm of breast - MM 3D SCREENING MAMMOGRAM BILATERAL BREAST; Future   3.  High risk HPV - plan to repeat pap/HR HPV 1 year

## 2022-12-17 ENCOUNTER — Other Ambulatory Visit (HOSPITAL_BASED_OUTPATIENT_CLINIC_OR_DEPARTMENT_OTHER): Payer: Self-pay | Admitting: Obstetrics & Gynecology

## 2022-12-17 DIAGNOSIS — M2559 Pain in other specified joint: Secondary | ICD-10-CM

## 2022-12-24 ENCOUNTER — Ambulatory Visit (HOSPITAL_BASED_OUTPATIENT_CLINIC_OR_DEPARTMENT_OTHER)
Admission: RE | Admit: 2022-12-24 | Discharge: 2022-12-24 | Disposition: A | Discharge status: Home / Self Care | Payer: Medicaid Other | Source: Ambulatory Visit | Attending: Obstetrics & Gynecology | Admitting: Obstetrics & Gynecology

## 2022-12-24 DIAGNOSIS — Z1231 Encounter for screening mammogram for malignant neoplasm of breast: Secondary | ICD-10-CM | POA: Insufficient documentation

## 2022-12-29 ENCOUNTER — Other Ambulatory Visit: Payer: Self-pay | Admitting: Obstetrics & Gynecology

## 2022-12-29 DIAGNOSIS — R928 Other abnormal and inconclusive findings on diagnostic imaging of breast: Secondary | ICD-10-CM

## 2023-01-08 ENCOUNTER — Other Ambulatory Visit: Payer: Self-pay | Admitting: Nurse Practitioner

## 2023-01-08 DIAGNOSIS — J302 Other seasonal allergic rhinitis: Secondary | ICD-10-CM

## 2023-01-08 MED ORDER — METHOCARBAMOL 750 MG PO TABS
750.0000 mg | ORAL_TABLET | Freq: Four times a day (QID) | ORAL | 0 refills | Status: DC | PRN
  Filled 2023-01-08 – 2023-06-11 (×2): qty 30, 8d supply, fill #0

## 2023-01-09 ENCOUNTER — Other Ambulatory Visit (HOSPITAL_COMMUNITY): Payer: Self-pay

## 2023-01-09 ENCOUNTER — Other Ambulatory Visit: Payer: Self-pay

## 2023-01-09 NOTE — Telephone Encounter (Signed)
LMOVM for pt to call regarding refill request

## 2023-01-10 ENCOUNTER — Other Ambulatory Visit: Payer: Self-pay

## 2023-01-13 ENCOUNTER — Other Ambulatory Visit: Payer: Self-pay

## 2023-01-16 ENCOUNTER — Other Ambulatory Visit: Payer: Self-pay | Admitting: Nurse Practitioner

## 2023-01-16 DIAGNOSIS — K219 Gastro-esophageal reflux disease without esophagitis: Secondary | ICD-10-CM

## 2023-01-16 DIAGNOSIS — I10 Essential (primary) hypertension: Secondary | ICD-10-CM

## 2023-01-18 ENCOUNTER — Ambulatory Visit
Admission: RE | Admit: 2023-01-18 | Discharge: 2023-01-18 | Disposition: A | Discharge status: Home / Self Care | Payer: Medicaid Other | Source: Ambulatory Visit | Attending: Obstetrics & Gynecology | Admitting: Obstetrics & Gynecology

## 2023-01-18 DIAGNOSIS — R928 Other abnormal and inconclusive findings on diagnostic imaging of breast: Secondary | ICD-10-CM

## 2023-01-18 DIAGNOSIS — N6325 Unspecified lump in the left breast, overlapping quadrants: Secondary | ICD-10-CM | POA: Diagnosis not present

## 2023-01-18 DIAGNOSIS — N6321 Unspecified lump in the left breast, upper outer quadrant: Secondary | ICD-10-CM | POA: Diagnosis not present

## 2023-01-19 ENCOUNTER — Other Ambulatory Visit: Payer: Self-pay | Admitting: Obstetrics & Gynecology

## 2023-01-19 DIAGNOSIS — N6489 Other specified disorders of breast: Secondary | ICD-10-CM

## 2023-01-19 DIAGNOSIS — N6320 Unspecified lump in the left breast, unspecified quadrant: Secondary | ICD-10-CM

## 2023-01-20 ENCOUNTER — Telehealth (HOSPITAL_BASED_OUTPATIENT_CLINIC_OR_DEPARTMENT_OTHER): Payer: Self-pay | Admitting: *Deleted

## 2023-01-20 NOTE — Telephone Encounter (Signed)
Called pt to ensure that she has scheduled follow up for abnormal mammogram. Pt has appt 8/8 for additional testing.

## 2023-01-26 ENCOUNTER — Ambulatory Visit
Admission: RE | Admit: 2023-01-26 | Discharge: 2023-01-26 | Disposition: A | Discharge status: Home / Self Care | Payer: Medicaid Other | Source: Ambulatory Visit | Attending: Obstetrics & Gynecology | Admitting: Obstetrics & Gynecology

## 2023-01-26 DIAGNOSIS — N6459 Other signs and symptoms in breast: Secondary | ICD-10-CM | POA: Diagnosis not present

## 2023-01-26 DIAGNOSIS — N6489 Other specified disorders of breast: Secondary | ICD-10-CM

## 2023-01-26 DIAGNOSIS — N6022 Fibroadenosis of left breast: Secondary | ICD-10-CM | POA: Diagnosis not present

## 2023-01-26 DIAGNOSIS — N6321 Unspecified lump in the left breast, upper outer quadrant: Secondary | ICD-10-CM | POA: Diagnosis not present

## 2023-01-26 DIAGNOSIS — N6320 Unspecified lump in the left breast, unspecified quadrant: Secondary | ICD-10-CM

## 2023-01-26 DIAGNOSIS — R928 Other abnormal and inconclusive findings on diagnostic imaging of breast: Secondary | ICD-10-CM | POA: Diagnosis not present

## 2023-01-26 HISTORY — PX: BREAST BIOPSY: SHX20

## 2023-02-02 ENCOUNTER — Inpatient Hospital Stay: Payer: Medicaid Other | Admitting: Internal Medicine

## 2023-02-02 ENCOUNTER — Inpatient Hospital Stay: Payer: Medicaid Other | Attending: Hematology

## 2023-02-16 ENCOUNTER — Other Ambulatory Visit (HOSPITAL_BASED_OUTPATIENT_CLINIC_OR_DEPARTMENT_OTHER): Payer: Self-pay | Admitting: Obstetrics & Gynecology

## 2023-02-16 ENCOUNTER — Other Ambulatory Visit: Payer: Self-pay | Admitting: Nurse Practitioner

## 2023-02-16 DIAGNOSIS — N6489 Other specified disorders of breast: Secondary | ICD-10-CM

## 2023-02-16 DIAGNOSIS — N898 Other specified noninflammatory disorders of vagina: Secondary | ICD-10-CM

## 2023-03-07 DIAGNOSIS — N6012 Diffuse cystic mastopathy of left breast: Secondary | ICD-10-CM | POA: Diagnosis not present

## 2023-03-07 DIAGNOSIS — F321 Major depressive disorder, single episode, moderate: Secondary | ICD-10-CM | POA: Diagnosis not present

## 2023-03-07 DIAGNOSIS — N6489 Other specified disorders of breast: Secondary | ICD-10-CM | POA: Diagnosis not present

## 2023-03-21 DIAGNOSIS — K219 Gastro-esophageal reflux disease without esophagitis: Secondary | ICD-10-CM | POA: Diagnosis not present

## 2023-03-21 DIAGNOSIS — R92322 Mammographic fibroglandular density, left breast: Secondary | ICD-10-CM | POA: Diagnosis not present

## 2023-03-21 DIAGNOSIS — I10 Essential (primary) hypertension: Secondary | ICD-10-CM | POA: Diagnosis not present

## 2023-03-21 DIAGNOSIS — N6022 Fibroadenosis of left breast: Secondary | ICD-10-CM | POA: Diagnosis not present

## 2023-03-21 DIAGNOSIS — N6012 Diffuse cystic mastopathy of left breast: Secondary | ICD-10-CM | POA: Diagnosis not present

## 2023-03-21 DIAGNOSIS — N6032 Fibrosclerosis of left breast: Secondary | ICD-10-CM | POA: Diagnosis not present

## 2023-03-21 DIAGNOSIS — R928 Other abnormal and inconclusive findings on diagnostic imaging of breast: Secondary | ICD-10-CM | POA: Diagnosis not present

## 2023-03-21 HISTORY — PX: BREAST EXCISIONAL BIOPSY: SUR124

## 2023-04-13 ENCOUNTER — Ambulatory Visit: Payer: Self-pay | Admitting: Nurse Practitioner

## 2023-06-12 ENCOUNTER — Other Ambulatory Visit: Payer: Self-pay

## 2023-06-12 ENCOUNTER — Encounter: Payer: Self-pay | Admitting: Pharmacist

## 2023-06-12 ENCOUNTER — Other Ambulatory Visit (HOSPITAL_COMMUNITY): Payer: Self-pay

## 2023-06-15 ENCOUNTER — Other Ambulatory Visit: Payer: Self-pay

## 2023-06-17 ENCOUNTER — Other Ambulatory Visit: Payer: Self-pay | Admitting: Nurse Practitioner

## 2023-06-17 DIAGNOSIS — I10 Essential (primary) hypertension: Secondary | ICD-10-CM

## 2023-06-29 ENCOUNTER — Encounter: Payer: Self-pay | Admitting: Nurse Practitioner

## 2023-06-29 ENCOUNTER — Ambulatory Visit: Payer: Medicaid Other | Admitting: Nurse Practitioner

## 2023-06-29 VITALS — BP 113/66 | HR 65 | Temp 97.3°F | Wt 148.8 lb

## 2023-06-29 DIAGNOSIS — J302 Other seasonal allergic rhinitis: Secondary | ICD-10-CM | POA: Diagnosis not present

## 2023-06-29 DIAGNOSIS — Z13220 Encounter for screening for lipoid disorders: Secondary | ICD-10-CM | POA: Diagnosis not present

## 2023-06-29 DIAGNOSIS — Z1329 Encounter for screening for other suspected endocrine disorder: Secondary | ICD-10-CM | POA: Diagnosis not present

## 2023-06-29 DIAGNOSIS — I10 Essential (primary) hypertension: Secondary | ICD-10-CM | POA: Diagnosis not present

## 2023-06-29 DIAGNOSIS — Z0000 Encounter for general adult medical examination without abnormal findings: Secondary | ICD-10-CM

## 2023-06-29 DIAGNOSIS — L02216 Cutaneous abscess of umbilicus: Secondary | ICD-10-CM

## 2023-06-29 DIAGNOSIS — Z113 Encounter for screening for infections with a predominantly sexual mode of transmission: Secondary | ICD-10-CM

## 2023-06-29 MED ORDER — FLUTICASONE PROPIONATE 50 MCG/ACT NA SUSP
2.0000 | Freq: Every day | NASAL | 6 refills | Status: DC
Start: 2023-06-29 — End: 2024-03-29

## 2023-06-29 MED ORDER — AMLODIPINE BESYLATE 10 MG PO TABS
10.0000 mg | ORAL_TABLET | Freq: Every day | ORAL | 0 refills | Status: DC
Start: 2023-06-29 — End: 2023-07-26

## 2023-06-29 MED ORDER — MUPIROCIN 2 % EX OINT
1.0000 | TOPICAL_OINTMENT | Freq: Two times a day (BID) | CUTANEOUS | 0 refills | Status: AC
Start: 2023-06-29 — End: ?

## 2023-06-29 MED ORDER — DICLOFENAC SODIUM 1 % EX GEL: 2.0000 g | Freq: Four times a day (QID) | CUTANEOUS | 2 refills | Status: DC

## 2023-06-29 NOTE — Progress Notes (Signed)
 Subjective   Patient ID: Kayla Dunn, female    DOB: [birth year], 43 y.o.   MRN: [id number]  Chief Complaint  Patient presents with   belly button checked    Pus     Referring provider: [name], NP  Kayla Dunn is a 43 y.o. female with Past Medical History: 2023: Anemia     Comment:  Follows w/ Dr. [facility]rod [facility]. s/p              iron  infusions No date: Arthritis     Comment:  back No date: Asthma     Comment:  mild No date: Bronchitis No date: Chronic low back pain     Comment:  Follows w/ ortho, Dr. Kay Cummins. w/ bilateral sciatica 06/28/2019: COVID-19 No date: Depression     Comment:  hx of No date: GERD (gastroesophageal reflux disease)     Comment:  Follows w/ PCP. No date: Heart murmur No date: Heart palpitations     Comment:  Follows w/ PCP. Patient is taking a beta blocker. No date: Hypertension     Comment:  Follows with PCP, Bascom Oley, NP. No date: Insomnia No date: Migraines     Comment:  Follows w/ PCP. 2015: Pancreatitis No date: Perimenopausal 2010: PID (acute pelvic inflammatory disease) No date: Pregnancy induced hypertension No date: Preterm labor No date: PTSD (post-traumatic stress disorder) No date: Radiculopathy of cervical spine     Comment:  Follows w/ ortho, Dr. Kay Cummins. No date: Seasonal allergies No date: Sickle cell trait (HCC) No date: Wears glasses   HPI  Patient presents today for a follow-up visit.  She would like to have STD screening completed today.  She does need refills on medications today.  We will do routine labs.  Patient does have a history of umbilical abscess which is almost healed.  We will order some Bactroban  ointment for this. Denies f/c/s, n/v/d, hemoptysis, PND, leg swelling Denies chest pain or edema      Allergies  Allergen Reactions   Aloe Vera Hives and Itching   Aspirin Anaphylaxis   Hydromorphone  Hcl Anaphylaxis   Morphine  Hives   Shrimp [Shellfish  Allergy] Swelling   Dust Mite Extract     Seasonal allergies     Immunization History  Administered Date(s) Administered   Influenza Whole 04/28/2010   Influenza,inj,Quad PF,6+ Mos 03/03/2014, 05/14/2018   Td 07/21/1997   Tdap 04/14/2022    Tobacco History: Social History   Tobacco Use  Smoking Status Light Smoker   Current packs/day: 0.25   Average packs/day: 0.3 packs/day for 12.0 years (3.0 ttl pk-yrs)   Types: Cigarettes  Smokeless Tobacco Never   Ready to quit: Yes Counseling given: Yes   Outpatient Encounter Medications as of 06/29/2023  Medication Sig   albuterol  (PROVENTIL ) (2.5 MG/3ML) 0.083% nebulizer solution Take 3 mLs (2.5 mg total) by nebulization every 6 (six) hours as needed for wheezing or shortness of breath.   diclofenac  Sodium (VOLTAREN ) 1 % GEL Apply 2 g topically 4 (four) times daily.   gabapentin  (NEURONTIN ) 300 MG capsule Take 1 capsule (300 mg total) by mouth in the morning.   metoprolol  succinate (TOPROL -XL) 25 MG 24 hr tablet TAKE 1 TABLET (25 MG TOTAL) BY MOUTH DAILY.   montelukast  (SINGULAIR ) 10 MG tablet TAKE 1 TABLET BY MOUTH EVERYDAY AT BEDTIME   mupirocin  ointment (BACTROBAN ) 2 % Apply 1 Application topically 2 (two) times daily.   pantoprazole  (PROTONIX ) 40  MG tablet TAKE 1 TABLET BY MOUTH EVERY DAY   [DISCONTINUED] amLODipine  (NORVASC ) 10 MG tablet TAKE 1 TABLET BY MOUTH EVERY DAY   [DISCONTINUED] fluticasone  (FLONASE ) 50 MCG/ACT nasal spray Place 2 sprays into both nostrils daily.   amLODipine  (NORVASC ) 10 MG tablet Take 1 tablet (10 mg total) by mouth daily.   estradiol  (CLIMARA  - DOSED IN MG/24 HR) 0.025 mg/24hr patch PLACE 1 PATCH (0.025 MG TOTAL) ONTO THE SKIN ONCE A WEEK. (Patient not taking: Reported on 06/29/2023)   fluticasone  (FLONASE ) 50 MCG/ACT nasal spray Place 2 sprays into both nostrils daily.   nystatin  ointment (MYCOSTATIN ) Apply 1 application  topically daily as needed (rash). (Patient not taking: Reported on 06/29/2023)    [DISCONTINUED] methocarbamol  (ROBAXIN ) 750 MG tablet Take 1 tablet (750 mg total) by mouth every 6 (six) hours as needed for muscle spasms. (Patient not taking: Reported on 06/29/2023)   No facility-administered encounter medications on file as of 06/29/2023.    Review of Systems  Review of Systems  Constitutional: Negative.   HENT: Negative.    Cardiovascular: Negative.   Gastrointestinal: Negative.   Allergic/Immunologic: Negative.   Neurological: Negative.   Psychiatric/Behavioral: Negative.       Objective:   BP 113/66   Pulse 65   Temp (!) 97.3 F (36.3 C)   Wt 148 lb 12.8 oz (67.5 kg)   LMP 08/25/2022   SpO2 100%   BMI 27.66 kg/m   Wt Readings from Last 5 Encounters:  06/29/23 148 lb 12.8 oz (67.5 kg)  10/17/22 147 lb 3.2 oz (66.8 kg)  10/13/22 148 lb 12.8 oz (67.5 kg)  10/05/22 145 lb 8 oz (66 kg)  09/15/22 150 lb 9.6 oz (68.3 kg)     Physical Exam Vitals and nursing note reviewed.  Constitutional:      General: She is not in acute distress.    Appearance: She is well-developed.  Cardiovascular:     Rate and Rhythm: Normal rate and regular rhythm.  Pulmonary:     Effort: Pulmonary effort is normal.     Breath sounds: Normal breath sounds.  Neurological:     Mental Status: She is alert and oriented to person, place, and time.       Assessment & Plan:   Screen for STD (sexually transmitted disease) -     RPR+HIV+GC+CT Panel -     Chlamydia/Gonococcus/Trichomonas, NAA -     NuSwab Vaginitis Plus (VG+)  Routine adult health maintenance -     CBC -     Comprehensive metabolic panel  Thyroid  disorder screen -     TSH  Lipid screening -     Lipid panel  Primary hypertension -     amLODIPine  Besylate; Take 1 tablet (10 mg total) by mouth daily.  Dispense: 90 tablet; Refill: 0  Seasonal allergies -     Fluticasone  Propionate; Place 2 sprays into both nostrils daily.  Dispense: 16 g; Refill: 6  Abscess, umbilical -     Mupirocin ; Apply 1  Application topically 2 (two) times daily.  Dispense: 22 g; Refill: 0  Other orders -     Diclofenac  Sodium; Apply 2 g topically 4 (four) times daily.  Dispense: 100 g; Refill: 2     Return in about 6 months (around 12/27/2023).   Bascom GORMAN Borer, NP 07/14/2023

## 2023-06-29 NOTE — Patient Instructions (Signed)
 1. Screen for STD (sexually transmitted disease) (Primary)  - RPR+HIV+GC+CT Panel - Chlamydia/Gonococcus/Trichomonas, NAA - NuSwab Vaginitis Plus (VG+)  2. Routine adult health maintenance  - CBC - Comprehensive metabolic panel  3. Thyroid  disorder screen  - TSH  4. Lipid screening  - Lipid Panel  5. Primary hypertension  - amLODipine  (NORVASC ) 10 MG tablet; Take 1 tablet (10 mg total) by mouth daily.  Dispense: 90 tablet; Refill: 0  6. Seasonal allergies  - fluticasone  (FLONASE ) 50 MCG/ACT nasal spray; Place 2 sprays into both nostrils daily.  Dispense: 16 g; Refill: 6  7. Abscess, umbilical  - mupirocin  ointment (BACTROBAN ) 2 %; Apply 1 Application topically 2 (two) times daily.  Dispense: 22 g; Refill: 0   Follow up:  Follow up in 6 months

## 2023-07-01 LAB — CBC
Hematocrit: 38.8 % (ref 34.0–46.6)
Hemoglobin: 12.8 g/dL (ref 11.1–15.9)
MCH: 30.3 pg (ref 26.6–33.0)
MCHC: 33 g/dL (ref 31.5–35.7)
MCV: 92 fL (ref 79–97)
Platelets: 427 10*3/uL (ref 150–450)
RBC: 4.23 x10E6/uL (ref 3.77–5.28)
RDW: 12.6 % (ref 11.7–15.4)
WBC: 8.1 10*3/uL (ref 3.4–10.8)

## 2023-07-01 LAB — LIPID PANEL
Chol/HDL Ratio: 2 {ratio} (ref 0.0–4.4)
Cholesterol, Total: 147 mg/dL (ref 100–199)
HDL: 73 mg/dL (ref >39)
LDL Chol Calc (NIH): 43 mg/dL (ref 0–99)
Triglycerides: 198 mg/dL — ABNORMAL HIGH (ref 0–149)
VLDL Cholesterol Cal: 31 mg/dL (ref 5–40)

## 2023-07-01 LAB — COMPREHENSIVE METABOLIC PANEL
ALT: 14 [IU]/L (ref 0–32)
AST: 19 [IU]/L (ref 0–40)
Albumin: 4.2 g/dL (ref 3.9–4.9)
Alkaline Phosphatase: 105 [IU]/L (ref 44–121)
BUN/Creatinine Ratio: 12 (ref 9–23)
BUN: 10 mg/dL (ref 6–24)
Bilirubin Total: <0.2 mg/dL (ref 0.0–1.2)
CO2: 24 mmol/L (ref 20–29)
Calcium: 9.1 mg/dL (ref 8.7–10.2)
Chloride: 105 mmol/L (ref 96–106)
Creatinine, Ser: 0.81 mg/dL (ref 0.57–1.00)
Globulin, Total: 2.6 g/dL (ref 1.5–4.5)
Glucose: 83 mg/dL (ref 70–99)
Potassium: 4.4 mmol/L (ref 3.5–5.2)
Sodium: 142 mmol/L (ref 134–144)
Total Protein: 6.8 g/dL (ref 6.0–8.5)
eGFR: 93 mL/min/{1.73_m2} (ref >59)

## 2023-07-01 LAB — RPR+HIV+GC+CT PANEL: RPR Ser Ql: NONREACTIVE

## 2023-07-01 LAB — TSH: TSH: 0.664 u[IU]/mL (ref 0.450–4.500)

## 2023-07-02 LAB — CHLAMYDIA/GONOCOCCUS/TRICHOMONAS, NAA
Chlamydia by NAA: NEGATIVE
Gonococcus by NAA: NEGATIVE
Trich vag by NAA: NEGATIVE

## 2023-07-02 LAB — NUSWAB VAGINITIS PLUS (VG+)
Atopobium vaginae: HIGH {score} — AB
BVAB 2: HIGH {score} — AB
Candida albicans, NAA: NEGATIVE
Candida glabrata, NAA: NEGATIVE
Chlamydia trachomatis, NAA: NEGATIVE
Megasphaera 1: HIGH {score} — AB
Neisseria gonorrhoeae, NAA: NEGATIVE
Trich vag by NAA: NEGATIVE

## 2023-07-03 ENCOUNTER — Other Ambulatory Visit: Payer: Self-pay | Admitting: Nurse Practitioner

## 2023-07-03 MED ORDER — METRONIDAZOLE 500 MG PO TABS: 500.0000 mg | ORAL_TABLET | Freq: Two times a day (BID) | ORAL | 0 refills | Status: AC

## 2023-07-14 ENCOUNTER — Encounter: Payer: Self-pay | Admitting: Nurse Practitioner

## 2023-07-26 ENCOUNTER — Other Ambulatory Visit: Payer: Self-pay

## 2023-07-26 DIAGNOSIS — I10 Essential (primary) hypertension: Secondary | ICD-10-CM

## 2023-07-26 MED ORDER — AMLODIPINE BESYLATE 10 MG PO TABS
10.0000 mg | ORAL_TABLET | Freq: Every day | ORAL | 0 refills | Status: DC
Start: 2023-07-26 — End: 2024-03-25

## 2023-08-14 DIAGNOSIS — I10 Essential (primary) hypertension: Secondary | ICD-10-CM

## 2023-08-14 DIAGNOSIS — R232 Flushing: Secondary | ICD-10-CM

## 2023-08-14 MED ORDER — DICLOFENAC SODIUM 1 % EX GEL: 2.0000 g | Freq: Four times a day (QID) | CUTANEOUS | 2 refills | Status: DC

## 2023-08-14 MED ORDER — GABAPENTIN 300 MG PO CAPS
300.0000 mg | ORAL_CAPSULE | Freq: Every morning | ORAL | 4 refills | Status: DC
Start: 2023-08-14 — End: 2023-12-28

## 2023-08-14 MED ORDER — METOPROLOL SUCCINATE ER 25 MG PO TB24
25.0000 mg | ORAL_TABLET | Freq: Every day | ORAL | 3 refills | Status: DC
Start: 2023-08-14 — End: 2024-03-29

## 2023-09-08 ENCOUNTER — Other Ambulatory Visit: Payer: Self-pay | Admitting: Nurse Practitioner

## 2023-09-08 DIAGNOSIS — J302 Other seasonal allergic rhinitis: Secondary | ICD-10-CM

## 2023-09-12 ENCOUNTER — Other Ambulatory Visit: Payer: Self-pay | Admitting: Nurse Practitioner

## 2023-09-12 DIAGNOSIS — K219 Gastro-esophageal reflux disease without esophagitis: Secondary | ICD-10-CM

## 2023-12-14 ENCOUNTER — Other Ambulatory Visit (HOSPITAL_COMMUNITY)
Admission: RE | Admit: 2023-12-14 | Discharge: 2023-12-14 | Disposition: A | Discharge status: Home / Self Care | Source: Ambulatory Visit | Attending: Certified Nurse Midwife | Admitting: Certified Nurse Midwife

## 2023-12-14 ENCOUNTER — Ambulatory Visit (HOSPITAL_BASED_OUTPATIENT_CLINIC_OR_DEPARTMENT_OTHER): Payer: Medicaid Other | Admitting: Obstetrics & Gynecology

## 2023-12-14 ENCOUNTER — Encounter (HOSPITAL_BASED_OUTPATIENT_CLINIC_OR_DEPARTMENT_OTHER): Payer: Self-pay | Admitting: Certified Nurse Midwife

## 2023-12-14 ENCOUNTER — Ambulatory Visit (HOSPITAL_BASED_OUTPATIENT_CLINIC_OR_DEPARTMENT_OTHER): Admitting: Certified Nurse Midwife

## 2023-12-14 VITALS — BP 124/82 | HR 81 | Ht 61.25 in | Wt 144.8 lb

## 2023-12-14 DIAGNOSIS — Z113 Encounter for screening for infections with a predominantly sexual mode of transmission: Secondary | ICD-10-CM | POA: Insufficient documentation

## 2023-12-14 DIAGNOSIS — Z862 Personal history of diseases of the blood and blood-forming organs and certain disorders involving the immune mechanism: Secondary | ICD-10-CM | POA: Diagnosis not present

## 2023-12-14 DIAGNOSIS — Z01419 Encounter for gynecological examination (general) (routine) without abnormal findings: Secondary | ICD-10-CM

## 2023-12-14 DIAGNOSIS — Z0000 Encounter for general adult medical examination without abnormal findings: Secondary | ICD-10-CM | POA: Diagnosis not present

## 2023-12-14 DIAGNOSIS — Z1231 Encounter for screening mammogram for malignant neoplasm of breast: Secondary | ICD-10-CM

## 2023-12-14 NOTE — Progress Notes (Signed)
 43 y.o. [id number] Significant Other Black or African American female here for annual exam. Pt states she is doing well.   Patient's last menstrual period was 08/25/2022.          Sexually active: Yes.    The current method of family planning is status post hysterectomy.    Exercising: Yes.     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 03/2023: Left Breast Excisional  biopsy-complex sclerosing lesion following needle wire localization MMG:  Next annual screening mammogram due 01/2024 (ordered) Colonoscopy:  n/a BMD:   n/a Screening Labs: Pt has appt with PCP coming up. She desired to go ahead and collect routine labs for Bascom Borer, NP (ordered).   reports that she has been smoking cigarettes. She has a 3 pack-year smoking history. She has never used smokeless tobacco. She reports current alcohol use of about 6.0 standard drinks of alcohol per week. She reports current drug use. Drug: Marijuana.  Past Medical History:  Diagnosis Date   Anemia 2023   Follows w/ Dr. Sherrod [facility]. s/p iron  infusions   Arthritis    back   Asthma    mild   Bronchitis    Chronic low back pain    Follows w/ ortho, Dr. Kay Cummins. w/ bilateral sciatica   COVID-19 06/28/2019   Depression    hx of   GERD (gastroesophageal reflux disease)    Follows w/ PCP.   Heart murmur    Heart palpitations    Follows w/ PCP. Patient is taking a beta blocker.   Hypertension    Follows with PCP, Bascom Borer, NP.   Insomnia    Migraines    Follows w/ PCP.   Pancreatitis 2015   Perimenopausal    PID (acute pelvic inflammatory disease) 2010   Pregnancy induced hypertension    Preterm labor    PTSD (post-traumatic stress disorder)    Radiculopathy of cervical spine    Follows w/ ortho, Dr. Kay Cummins.   Seasonal allergies    Sickle cell trait (HCC)    Wears glasses     Past Surgical History:  Procedure Laterality Date   ANTERIOR  CERVICAL DECOMP/DISCECTOMY FUSION N/A 10/25/2021   Procedure: Anterior Cervical Decompression Fusion - Cervical five-Cervical six - Cervical six-Cervical seven - Cervical seven-Thoracic one;  Surgeon: Louis Shove, MD;  Location: [facility];  Service: Neurosurgery;  Laterality: N/A;   BREAST BIOPSY Left 01/26/2023   US  LT BREAST BX W LOC DEV 1ST LESION IMG BX SPEC US  GUIDE 01/26/2023 [facility]   BREAST BIOPSY Left 01/26/2023   MM LT BREAST BX W LOC DEV 1ST LESION IMAGE BX SPEC STEREO GUIDE 01/26/2023 [facility]   CESAREAN SECTION     x 2   CHOLECYSTECTOMY N/A 08/27/2013   Procedure: LAPAROSCOPIC CHOLECYSTECTOMY WITH INTRAOPERATIVE CHOLANGIOGRAM;  Surgeon: Donnice KATHEE Lunger, MD;  Location: [facility];  Service: General;  Laterality: N/A;   CYSTOSCOPY N/A 09/06/2022   Procedure: PHYLLIS;  Surgeon: Cleotilde Ronal RAMAN, MD;  Location: [facility];  Service: Gynecology;  Laterality: N/A;   DILATION AND CURETTAGE OF UTERUS     2010 and around 2018   OVARY SURGERY  2004   2004   TOTAL LAPAROSCOPIC HYSTERECTOMY WITH SALPINGECTOMY Bilateral 09/06/2022   Procedure: TOTAL LAPAROSCOPIC HYSTERECTOMY WITH SALPINGECTOMY AND POSSIBLE OOPHORECTOMY;  Surgeon: Cleotilde Ronal RAMAN, MD;  Location: [facility];  Service: Gynecology;  Laterality: Bilateral;  TUBAL LIGATION  2012    Current Outpatient Medications  Medication Sig Dispense Refill   albuterol  (PROVENTIL ) (2.5 MG/3ML) 0.083% nebulizer solution Take 3 mLs (2.5 mg total) by nebulization every 6 (six) hours as needed for wheezing or shortness of breath. 75 mL 1   amLODipine  (NORVASC ) 10 MG tablet Take 1 tablet (10 mg total) by mouth daily. 90 tablet 0   diclofenac  Sodium (VOLTAREN ) 1 % GEL Apply 2 g topically 4 (four) times daily. 100 g 2   estradiol  (CLIMARA  - DOSED IN MG/24 HR) 0.025 mg/24hr patch PLACE 1 PATCH (0.025 MG TOTAL) ONTO THE SKIN ONCE A WEEK. (Patient not taking: Reported on 06/29/2023) 12 patch 5   fluticasone  (FLONASE )  50 MCG/ACT nasal spray Place 2 sprays into both nostrils daily. 16 g 6   gabapentin  (NEURONTIN ) 300 MG capsule Take 1 capsule (300 mg total) by mouth in the morning. 90 capsule 4   metoprolol  succinate (TOPROL -XL) 25 MG 24 hr tablet Take 1 tablet (25 mg total) by mouth daily. 90 tablet 3   montelukast  (SINGULAIR ) 10 MG tablet TAKE 1 TABLET BY MOUTH EVERYDAY AT BEDTIME 90 tablet 1   mupirocin  ointment (BACTROBAN ) 2 % Apply 1 Application topically 2 (two) times daily. 22 g 0   nystatin  ointment (MYCOSTATIN ) Apply 1 application  topically daily as needed (rash). (Patient not taking: Reported on 06/29/2023)     pantoprazole  (PROTONIX ) 40 MG tablet TAKE 1 TABLET BY MOUTH EVERY DAY 90 tablet 1   No current facility-administered medications for this visit.    Family History  Problem Relation Age of Onset   Cancer Mother        colon   Arthritis Mother    Breast cancer Mother    Hypertension Father    Liver disease Father    Stroke Father    Diabetes Paternal Grandmother    Hypertension Paternal Grandmother    Heart disease Paternal Grandmother     ROS: Constitutional: negative Genitourinary:negative  Exam:   BP 124/82   Pulse 81   Ht 5' 1.25 (1.556 m)   Wt 144 lb 12.8 oz (65.7 kg)   LMP 08/25/2022   BMI 27.14 kg/m   Height: 5' 1.25 (155.6 cm)  General appearance: alert, cooperative and appears stated age Head: Normocephalic, without obvious abnormality, atraumatic Lungs: clear to auscultation bilaterally Breasts: normal appearance, no masses or tenderness, Inspection negative, No nipple retraction or dimpling, No nipple discharge or bleeding, No axillary or supraclavicular adenopathy, Normal to palpation without dominant masses, scar present (appears normal) left breast Heart: regular rate and rhythm Abdomen: soft, non-tender; bowel sounds normal; no masses,  no organomegaly Extremities: extremities normal, atraumatic, no cyanosis or edema Skin: Skin color, texture, turgor  normal. No rashes or lesions Lymph nodes: Cervical, supraclavicular, and axillary nodes normal. No abnormal inguinal nodes palpated Neurologic: Grossly normal   Pelvic: External genitalia:  no lesions              Urethra:  normal appearing urethra with no masses, tenderness or lesions              Bartholins and Skenes: normal                 Vagina: normal appearing vagina with normal color and no discharge, no lesions              Cervix: absent              Pap taken: No. Bimanual Exam:  Uterus:  uterus absent              Adnexa: no mass, fullness, tenderness               Rectovaginal: Confirms               Anus:  normal sphincter tone, no lesions  Chaperone,  CMA, was present for exam.  Assessment/Plan:  1. Encounter for annual routine gynecological examination - Continued breast self awareness encouraged - Plan bilateral screening mammogram [facility])  2. Screen for STD (sexually transmitted disease) (Primary) - Pt requested routine STI screening including serum STI screening - Cervicovaginal ancillary only( [place]) - HIV antibody (with reflex) - Hepatitis B Surface AntiGEN - Hepatitis C Antibody - RPR  3. Annual physical exam - Cervicovaginal ancillary only( [place]) - HIV antibody (with reflex) - Hepatitis B Surface AntiGEN - Hepatitis C Antibody - RPR - CBC - Comp Met ([facility]) - Hemoglobin A1c - Lipid panel  4. History of anemia - CBC  5. Encounter for screening mammogram for malignant neoplasm of breast - MM 3D SCREENING MAMMOGRAM BILATERAL BREAST; Future   RTO 1 year for annual gyn exam and prn if issues arise. Ms.

## 2023-12-15 LAB — LIPID PANEL
Chol/HDL Ratio: 1.9 ratio (ref 0.0–4.4)
Cholesterol, Total: 140 mg/dL (ref 100–199)
HDL: 73 mg/dL (ref >39)
LDL Chol Calc (NIH): 53 mg/dL (ref 0–99)
Triglycerides: 72 mg/dL (ref 0–149)
VLDL Cholesterol Cal: 14 mg/dL (ref 5–40)

## 2023-12-15 LAB — RPR: RPR Ser Ql: NONREACTIVE

## 2023-12-15 LAB — COMPREHENSIVE METABOLIC PANEL WITH GFR
ALT: 10 IU/L (ref 0–32)
AST: 19 IU/L (ref 0–40)
Albumin: 4.1 g/dL (ref 3.9–4.9)
Alkaline Phosphatase: 102 IU/L (ref 44–121)
BUN/Creatinine Ratio: 14 (ref 9–23)
BUN: 10 mg/dL (ref 6–24)
Bilirubin Total: 0.3 mg/dL (ref 0.0–1.2)
CO2: 20 mmol/L (ref 20–29)
Calcium: 9.2 mg/dL (ref 8.7–10.2)
Chloride: 102 mmol/L (ref 96–106)
Creatinine, Ser: 0.74 mg/dL (ref 0.57–1.00)
Globulin, Total: 2.7 g/dL (ref 1.5–4.5)
Glucose: 83 mg/dL (ref 70–99)
Potassium: 4.2 mmol/L (ref 3.5–5.2)
Sodium: 139 mmol/L (ref 134–144)
Total Protein: 6.8 g/dL (ref 6.0–8.5)
eGFR: 104 mL/min/{1.73_m2} (ref >59)

## 2023-12-15 LAB — CBC
Hematocrit: 38.2 % (ref 34.0–46.6)
Hemoglobin: 12.4 g/dL (ref 11.1–15.9)
MCH: 30.5 pg (ref 26.6–33.0)
MCHC: 32.5 g/dL (ref 31.5–35.7)
MCV: 94 fL (ref 79–97)
Platelets: 462 10*3/uL — ABNORMAL HIGH (ref 150–450)
RBC: 4.07 x10E6/uL (ref 3.77–5.28)
RDW: 12.5 % (ref 11.7–15.4)
WBC: 9.4 10*3/uL (ref 3.4–10.8)

## 2023-12-15 LAB — HEPATITIS B SURFACE ANTIGEN: Hepatitis B Surface Ag: NEGATIVE

## 2023-12-15 LAB — HEPATITIS C ANTIBODY: Hep C Virus Ab: NONREACTIVE

## 2023-12-15 LAB — HEMOGLOBIN A1C
Est. average glucose Bld gHb Est-mCnc: 103 mg/dL
Hgb A1c MFr Bld: 5.2 % (ref 4.8–5.6)

## 2023-12-15 LAB — HIV ANTIBODY (ROUTINE TESTING W REFLEX): HIV Screen 4th Generation wRfx: NONREACTIVE

## 2023-12-19 LAB — CERVICOVAGINAL ANCILLARY ONLY
Chlamydia: NEGATIVE
Comment: NEGATIVE
Comment: NORMAL
Neisseria Gonorrhea: NEGATIVE

## 2023-12-25 ENCOUNTER — Ambulatory Visit (HOSPITAL_BASED_OUTPATIENT_CLINIC_OR_DEPARTMENT_OTHER): Payer: Self-pay | Admitting: Certified Nurse Midwife

## 2023-12-28 ENCOUNTER — Encounter: Payer: Self-pay | Admitting: Nurse Practitioner

## 2023-12-28 ENCOUNTER — Ambulatory Visit (INDEPENDENT_AMBULATORY_CARE_PROVIDER_SITE_OTHER): Payer: Self-pay | Admitting: Nurse Practitioner

## 2023-12-28 VITALS — BP 138/81 | HR 73 | Temp 98.5°F | Wt 144.8 lb

## 2023-12-28 DIAGNOSIS — L309 Dermatitis, unspecified: Secondary | ICD-10-CM

## 2023-12-28 DIAGNOSIS — R232 Flushing: Secondary | ICD-10-CM | POA: Diagnosis not present

## 2023-12-28 MED ORDER — TRIAMCINOLONE ACETONIDE 0.1 % EX CREA: 1.0000 | TOPICAL_CREAM | Freq: Two times a day (BID) | CUTANEOUS | 0 refills | Status: DC

## 2023-12-28 MED ORDER — PREDNISONE 20 MG PO TABS
20.0000 mg | ORAL_TABLET | Freq: Every day | ORAL | 0 refills | Status: AC
Start: 2023-12-28 — End: 2024-01-02

## 2023-12-28 MED ORDER — GABAPENTIN 300 MG PO CAPS
300.0000 mg | ORAL_CAPSULE | Freq: Three times a day (TID) | ORAL | 0 refills | Status: DC
Start: 2023-12-28 — End: 2024-01-24

## 2023-12-28 NOTE — Progress Notes (Signed)
 Subjective   Patient ID: Kayla Dunn, female    DOB: April 12, 1981, 43 y.o.   MRN: [id number]  Chief Complaint  Patient presents with   Follow-up    Referring provider: [name], NP  Kayla Dunn is a 43 y.o. female with Past Medical History: 2023: Anemia     Comment:  Follows w/ Dr. Sherrod st [facility]. s/p              iron  infusions No date: Arthritis     Comment:  back No date: Asthma     Comment:  mild No date: Bronchitis No date: Chronic low back pain     Comment:  Follows w/ ortho, Dr. Kay Cummins. w/ bilateral sciatica 06/28/2019: COVID-19 No date: Depression     Comment:  hx of No date: GERD (gastroesophageal reflux disease)     Comment:  Follows w/ PCP. No date: Heart murmur No date: Heart palpitations     Comment:  Follows w/ PCP. Patient is taking a beta blocker. No date: Hypertension     Comment:  Follows with PCP, Bascom Oley, NP. No date: Insomnia No date: Migraines     Comment:  Follows w/ PCP. 2015: Pancreatitis No date: Perimenopausal 2010: PID (acute pelvic inflammatory disease) No date: Pregnancy induced hypertension No date: Preterm labor No date: PTSD (post-traumatic stress disorder) No date: Radiculopathy of cervical spine     Comment:  Follows w/ ortho, Dr. Kay Cummins. No date: Seasonal allergies No date: Sickle cell trait (HCC) No date: Wears glasses   HPI  Patient presents today for follow-up visit.  Overall she has been doing well.  She is having some issues with eczema and low back pain.  We will order prednisone  and Kenalog  cream.  Patient did have recent blood work which was overall normal.  This was done through her OB/GYN. Denies f/c/s, n/v/d, hemoptysis, PND, leg swelling Denies chest pain or edema     Allergies  Allergen Reactions   Aloe Vera Hives and Itching   Aspirin Anaphylaxis   Hydromorphone  Hcl Anaphylaxis   Morphine  Hives   Shrimp [Shellfish Allergy] Swelling   Dust Mite Extract      Seasonal allergies     Immunization History  Administered Date(s) Administered   Influenza Whole 04/28/2010   Influenza,inj,Quad PF,6+ Mos 03/03/2014, 05/14/2018   Td 07/21/1997   Tdap 04/14/2022    Tobacco History: Social History   Tobacco Use  Smoking Status Light Smoker   Current packs/day: 0.25   Average packs/day: 0.3 packs/day for 12.0 years (3.0 ttl pk-yrs)   Types: Cigarettes  Smokeless Tobacco Never   Ready to quit: Not Answered Counseling given: Yes   Outpatient Encounter Medications as of 12/28/2023  Medication Sig   albuterol  (PROVENTIL ) (2.5 MG/3ML) 0.083% nebulizer solution Take 3 mLs (2.5 mg total) by nebulization every 6 (six) hours as needed for wheezing or shortness of breath.   amLODipine  (NORVASC ) 10 MG tablet Take 1 tablet (10 mg total) by mouth daily.   diclofenac  Sodium (VOLTAREN ) 1 % GEL Apply 2 g topically 4 (four) times daily.   fluticasone  (FLONASE ) 50 MCG/ACT nasal spray Place 2 sprays into both nostrils daily.   metoprolol  succinate (TOPROL -XL) 25 MG 24 hr tablet Take 1 tablet (25 mg total) by mouth daily.   montelukast  (SINGULAIR ) 10 MG tablet TAKE 1 TABLET BY MOUTH EVERYDAY AT BEDTIME   mupirocin  ointment (BACTROBAN ) 2 % Apply 1 Application topically 2 (two) times daily.  pantoprazole  (PROTONIX ) 40 MG tablet TAKE 1 TABLET BY MOUTH EVERY DAY   predniSONE  (DELTASONE ) 20 MG tablet Take 1 tablet (20 mg total) by mouth daily with breakfast for 5 days.   triamcinolone  cream (KENALOG ) 0.1 % Apply 1 Application topically 2 (two) times daily.   [DISCONTINUED] gabapentin  (NEURONTIN ) 300 MG capsule Take 1 capsule (300 mg total) by mouth in the morning.   gabapentin  (NEURONTIN ) 300 MG capsule Take 1 capsule (300 mg total) by mouth 3 (three) times daily.   nystatin  ointment (MYCOSTATIN ) Apply 1 application  topically daily as needed (rash). (Patient not taking: Reported on 06/29/2023)   No facility-administered encounter medications on file as of  12/28/2023.    Review of Systems  Review of Systems  Constitutional: Negative.   HENT: Negative.    Cardiovascular: Negative.   Gastrointestinal: Negative.   Allergic/Immunologic: Negative.   Neurological: Negative.   Psychiatric/Behavioral: Negative.       Objective:   BP 138/81   Pulse 73   Temp 98.5 F (36.9 C) (Oral)   Wt 144 lb 12.8 oz (65.7 kg)   LMP 08/25/2022   SpO2 99%   BMI 27.14 kg/m   Wt Readings from Last 5 Encounters:  12/28/23 144 lb 12.8 oz (65.7 kg)  12/14/23 144 lb 12.8 oz (65.7 kg)  06/29/23 148 lb 12.8 oz (67.5 kg)  10/17/22 147 lb 3.2 oz (66.8 kg)  10/13/22 148 lb 12.8 oz (67.5 kg)     Physical Exam Vitals and nursing note reviewed.  Constitutional:      General: She is not in acute distress.    Appearance: She is well-developed.  Cardiovascular:     Rate and Rhythm: Normal rate and regular rhythm.  Pulmonary:     Effort: Pulmonary effort is normal.     Breath sounds: Normal breath sounds.  Neurological:     Mental Status: She is alert and oriented to person, place, and time.       Assessment & Plan:   Eczema, unspecified type -     predniSONE ; Take 1 tablet (20 mg total) by mouth daily with breakfast for 5 days.  Dispense: 5 tablet; Refill: 0  Hot flashes -     Gabapentin ; Take 1 capsule (300 mg total) by mouth 3 (three) times daily.  Dispense: 90 capsule; Refill: 0  Other orders -     Triamcinolone  Acetonide; Apply 1 Application topically 2 (two) times daily.  Dispense: 30 g; Refill: 0     Return in about 6 months (around 06/29/2024).   Bascom GORMAN Borer, NP 12/28/2023

## 2024-01-24 ENCOUNTER — Other Ambulatory Visit: Payer: Self-pay | Admitting: Nurse Practitioner

## 2024-01-24 DIAGNOSIS — R232 Flushing: Secondary | ICD-10-CM

## 2024-02-26 ENCOUNTER — Other Ambulatory Visit: Payer: Self-pay | Admitting: Nurse Practitioner

## 2024-02-26 DIAGNOSIS — R232 Flushing: Secondary | ICD-10-CM

## 2024-03-09 ENCOUNTER — Other Ambulatory Visit: Payer: Self-pay | Admitting: Nurse Practitioner

## 2024-03-09 DIAGNOSIS — J302 Other seasonal allergic rhinitis: Secondary | ICD-10-CM

## 2024-03-09 DIAGNOSIS — K219 Gastro-esophageal reflux disease without esophagitis: Secondary | ICD-10-CM

## 2024-03-24 ENCOUNTER — Other Ambulatory Visit: Payer: Self-pay | Admitting: Nurse Practitioner

## 2024-03-24 DIAGNOSIS — I10 Essential (primary) hypertension: Secondary | ICD-10-CM

## 2024-03-29 ENCOUNTER — Ambulatory Visit (INDEPENDENT_AMBULATORY_CARE_PROVIDER_SITE_OTHER): Payer: Self-pay | Admitting: Nurse Practitioner

## 2024-03-29 ENCOUNTER — Encounter: Payer: Self-pay | Admitting: Nurse Practitioner

## 2024-03-29 ENCOUNTER — Other Ambulatory Visit: Payer: Self-pay | Admitting: Medical Genetics

## 2024-03-29 VITALS — BP 136/77 | HR 65 | Resp 16 | Wt 144.0 lb

## 2024-03-29 DIAGNOSIS — M545 Low back pain, unspecified: Secondary | ICD-10-CM

## 2024-03-29 DIAGNOSIS — J302 Other seasonal allergic rhinitis: Secondary | ICD-10-CM | POA: Diagnosis not present

## 2024-03-29 DIAGNOSIS — I10 Essential (primary) hypertension: Secondary | ICD-10-CM

## 2024-03-29 DIAGNOSIS — Z113 Encounter for screening for infections with a predominantly sexual mode of transmission: Secondary | ICD-10-CM

## 2024-03-29 DIAGNOSIS — G8929 Other chronic pain: Secondary | ICD-10-CM | POA: Diagnosis not present

## 2024-03-29 MED ORDER — METOPROLOL SUCCINATE ER 25 MG PO TB24: 25.0000 mg | ORAL_TABLET | Freq: Every day | ORAL | 3 refills | Status: AC

## 2024-03-29 MED ORDER — FLUTICASONE PROPIONATE 50 MCG/ACT NA SUSP
2.0000 | Freq: Every day | NASAL | 6 refills | Status: AC
Start: 2024-03-29 — End: ?

## 2024-03-29 MED ORDER — CYCLOBENZAPRINE HCL 10 MG PO TABS: 10.0000 mg | ORAL_TABLET | Freq: Three times a day (TID) | ORAL | 0 refills | Status: DC | PRN

## 2024-03-29 MED ORDER — DICLOFENAC SODIUM 1 % EX GEL: 2.0000 g | Freq: Four times a day (QID) | CUTANEOUS | 2 refills | Status: DC

## 2024-03-29 MED ORDER — TRIAMCINOLONE ACETONIDE 0.1 % EX CREA: 1.0000 | TOPICAL_CREAM | Freq: Two times a day (BID) | CUTANEOUS | 0 refills | Status: AC

## 2024-03-29 MED ORDER — ALBUTEROL SULFATE (2.5 MG/3ML) 0.083% IN NEBU
2.5000 mg | INHALATION_SOLUTION | Freq: Four times a day (QID) | RESPIRATORY_TRACT | 1 refills | Status: AC | PRN
Start: 2024-03-29 — End: ?

## 2024-03-29 MED ORDER — PREDNISONE 20 MG PO TABS: 20.0000 mg | ORAL_TABLET | Freq: Every day | ORAL | 0 refills | Status: AC

## 2024-03-29 NOTE — Progress Notes (Signed)
 Subjective   Patient ID: Kayla Dunn, female    DOB: 1980/10/31, 43 y.o.   MRN: [id number]  Chief Complaint  Patient presents with   Hypertension    Been having headaches for 2 weeks    New Med Request    Requesting something for pain for her back     Referring provider: [name], NP  Kayla Dunn is a 43 y.o. female with Past Medical History: 2023: Anemia     Comment:  Follows w/ Dr. Sherrod st [facility]. s/p              iron  infusions No date: Arthritis     Comment:  back No date: Asthma     Comment:  mild No date: Bronchitis No date: Chronic low back pain     Comment:  Follows w/ ortho, Dr. Kay Cummins. w/ bilateral sciatica 06/28/2019: COVID-19 No date: Depression     Comment:  hx of No date: GERD (gastroesophageal reflux disease)     Comment:  Follows w/ PCP. No date: Heart murmur No date: Heart palpitations     Comment:  Follows w/ PCP. Patient is taking a beta blocker. No date: Hypertension     Comment:  Follows with PCP, Bascom Oley, NP. No date: Insomnia No date: Migraines     Comment:  Follows w/ PCP. 2015: Pancreatitis No date: Perimenopausal 2010: PID (acute pelvic inflammatory disease) No date: Pregnancy induced hypertension No date: Preterm labor No date: PTSD (post-traumatic stress disorder) No date: Radiculopathy of cervical spine     Comment:  Follows w/ ortho, Dr. Kay Cummins. No date: Seasonal allergies No date: Sickle cell trait No date: Wears glasses   HPI  Patient presents today for a follow-up on hypertension.  Blood pressure was stable in the office today.  Patient states that she has been having headaches that she was concerned that it was associated with elevated blood pressures.  She also complains of low back pain.  We will trial prednisone  and Flexeril .  Patient does need refills on current medications. Denies f/c/s, n/v/d, hemoptysis, PND, leg swelling Denies chest pain or  edema       Allergies  Allergen Reactions   Aloe Vera Hives and Itching   Aspirin Anaphylaxis   Hydromorphone  Hcl Anaphylaxis   Morphine  Hives   Shrimp [Shellfish Allergy] Swelling   Dust Mite Extract     Seasonal allergies     Immunization History  Administered Date(s) Administered   Influenza Whole 04/28/2010   Influenza,inj,Quad PF,6+ Mos 03/03/2014, 05/14/2018   Td 07/21/1997   Tdap 04/14/2022    Tobacco History: Social History   Tobacco Use  Smoking Status Light Smoker   Current packs/day: 0.25   Average packs/day: 0.3 packs/day for 12.0 years (3.0 ttl pk-yrs)   Types: Cigarettes  Smokeless Tobacco Never   Ready to quit: Not Answered Counseling given: Not Answered   Outpatient Encounter Medications as of 03/29/2024  Medication Sig   amLODipine  (NORVASC ) 10 MG tablet TAKE 1 TABLET BY MOUTH EVERY DAY   cyclobenzaprine  (FLEXERIL ) 10 MG tablet Take 1 tablet (10 mg total) by mouth 3 (three) times daily as needed for muscle spasms.   gabapentin  (NEURONTIN ) 300 MG capsule TAKE 1 CAPSULE BY MOUTH THREE TIMES A DAY   montelukast  (SINGULAIR ) 10 MG tablet TAKE 1 TABLET BY MOUTH EVERYDAY AT BEDTIME   pantoprazole  (PROTONIX ) 40 MG tablet TAKE 1 TABLET BY MOUTH EVERY DAY   predniSONE  (DELTASONE ) 20  MG tablet Take 1 tablet (20 mg total) by mouth daily with breakfast for 5 days.   [DISCONTINUED] albuterol  (PROVENTIL ) (2.5 MG/3ML) 0.083% nebulizer solution Take 3 mLs (2.5 mg total) by nebulization every 6 (six) hours as needed for wheezing or shortness of breath.   [DISCONTINUED] diclofenac  Sodium (VOLTAREN ) 1 % GEL Apply 2 g topically 4 (four) times daily.   [DISCONTINUED] fluticasone  (FLONASE ) 50 MCG/ACT nasal spray Place 2 sprays into both nostrils daily.   [DISCONTINUED] triamcinolone  cream (KENALOG ) 0.1 % Apply 1 Application topically 2 (two) times daily.   albuterol  (PROVENTIL ) (2.5 MG/3ML) 0.083% nebulizer solution Take 3 mLs (2.5 mg total) by nebulization every 6 (six)  hours as needed for wheezing or shortness of breath.   diclofenac  Sodium (VOLTAREN ) 1 % GEL Apply 2 g topically 4 (four) times daily.   fluticasone  (FLONASE ) 50 MCG/ACT nasal spray Place 2 sprays into both nostrils daily.   metoprolol  succinate (TOPROL -XL) 25 MG 24 hr tablet Take 1 tablet (25 mg total) by mouth daily.   mupirocin  ointment (BACTROBAN ) 2 % Apply 1 Application topically 2 (two) times daily. (Patient not taking: Reported on 03/29/2024)   nystatin  ointment (MYCOSTATIN ) Apply 1 application  topically daily as needed (rash). (Patient not taking: Reported on 06/29/2023)   triamcinolone  cream (KENALOG ) 0.1 % Apply 1 Application topically 2 (two) times daily.   [DISCONTINUED] metoprolol  succinate (TOPROL -XL) 25 MG 24 hr tablet Take 1 tablet (25 mg total) by mouth daily.   No facility-administered encounter medications on file as of 03/29/2024.    Review of Systems  Review of Systems  Constitutional: Negative.   HENT: Negative.    Cardiovascular: Negative.   Gastrointestinal: Negative.   Allergic/Immunologic: Negative.   Neurological: Negative.   Psychiatric/Behavioral: Negative.       Objective:   BP 136/77   Pulse 65   Resp 16   Wt 144 lb (65.3 kg)   LMP 08/25/2022   SpO2 99%   BMI 26.99 kg/m   Wt Readings from Last 5 Encounters:  03/29/24 144 lb (65.3 kg)  12/28/23 144 lb 12.8 oz (65.7 kg)  12/14/23 144 lb 12.8 oz (65.7 kg)  06/29/23 148 lb 12.8 oz (67.5 kg)  10/17/22 147 lb 3.2 oz (66.8 kg)     Physical Exam Vitals and nursing note reviewed.  Constitutional:      General: She is not in acute distress.    Appearance: She is well-developed.  Cardiovascular:     Rate and Rhythm: Normal rate and regular rhythm.  Pulmonary:     Effort: Pulmonary effort is normal.     Breath sounds: Normal breath sounds.  Neurological:     Mental Status: She is alert and oriented to person, place, and time.       Assessment & Plan:   Chronic midline low back pain  without sciatica -     predniSONE ; Take 1 tablet (20 mg total) by mouth daily with breakfast for 5 days.  Dispense: 5 tablet; Refill: 0 -     Cyclobenzaprine  HCl; Take 1 tablet (10 mg total) by mouth 3 (three) times daily as needed for muscle spasms.  Dispense: 30 tablet; Refill: 0  Primary hypertension -     Metoprolol  Succinate ER; Take 1 tablet (25 mg total) by mouth daily.  Dispense: 90 tablet; Refill: 3 -     CBC -     Iron , TIBC and Ferritin Panel  Seasonal allergies -     Albuterol  Sulfate; Take 3 mLs (2.5  mg total) by nebulization every 6 (six) hours as needed for wheezing or shortness of breath.  Dispense: 75 mL; Refill: 1 -     Fluticasone  Propionate; Place 2 sprays into both nostrils daily.  Dispense: 16 g; Refill: 6  Screen for STD (sexually transmitted disease) -     RPR+HIV+GC+CT Panel -     Chlamydia/Gonococcus/Trichomonas, NAA -     NuSwab Vaginitis Plus (VG+)  Other orders -     Diclofenac  Sodium; Apply 2 g topically 4 (four) times daily.  Dispense: 100 g; Refill: 2 -     Triamcinolone  Acetonide; Apply 1 Application topically 2 (two) times daily.  Dispense: 30 g; Refill: 0     Return in about 6 months (around 09/27/2024).   Bascom GORMAN Borer, NP 03/29/2024

## 2024-03-30 LAB — CBC
Hematocrit: 40.5 % (ref 34.0–46.6)
Hemoglobin: 13 g/dL (ref 11.1–15.9)
MCH: 30.6 pg (ref 26.6–33.0)
MCHC: 32.1 g/dL (ref 31.5–35.7)
MCV: 95 fL (ref 79–97)
Platelets: 421 x10E3/uL (ref 150–450)
RBC: 4.25 x10E6/uL (ref 3.77–5.28)
RDW: 13 % (ref 11.7–15.4)
WBC: 6.3 x10E3/uL (ref 3.4–10.8)

## 2024-03-30 LAB — IRON,TIBC AND FERRITIN PANEL
Ferritin: 15 ng/mL (ref 15–150)
Iron Saturation: 18 % (ref 15–55)
Iron: 80 ug/dL (ref 27–159)
Total Iron Binding Capacity: 451 ug/dL — ABNORMAL HIGH (ref 250–450)
UIBC: 371 ug/dL (ref 131–425)

## 2024-03-31 LAB — RPR+HIV+GC+CT PANEL
HIV Screen 4th Generation wRfx: NONREACTIVE
RPR Ser Ql: NONREACTIVE

## 2024-03-31 LAB — CHLAMYDIA/GONOCOCCUS/TRICHOMONAS, NAA
Chlamydia by NAA: NEGATIVE
Gonococcus by NAA: NEGATIVE
Trich vag by NAA: NEGATIVE

## 2024-04-01 ENCOUNTER — Ambulatory Visit: Payer: Self-pay | Admitting: Nurse Practitioner

## 2024-04-01 LAB — NUSWAB VAGINITIS PLUS (VG+)
Chlamydia trachomatis, NAA: NEGATIVE
Neisseria gonorrhoeae, NAA: NEGATIVE
Trich vag by NAA: NEGATIVE

## 2024-04-01 MED ORDER — METRONIDAZOLE 500 MG PO TABS: 500.0000 mg | ORAL_TABLET | Freq: Two times a day (BID) | ORAL | 0 refills | Status: AC

## 2024-05-01 ENCOUNTER — Ambulatory Visit (INDEPENDENT_AMBULATORY_CARE_PROVIDER_SITE_OTHER): Payer: Self-pay | Admitting: Nurse Practitioner

## 2024-05-01 ENCOUNTER — Encounter: Payer: Self-pay | Admitting: Nurse Practitioner

## 2024-05-01 ENCOUNTER — Other Ambulatory Visit: Payer: Self-pay | Admitting: Nurse Practitioner

## 2024-05-01 VITALS — BP 129/80 | HR 73 | Wt 148.4 lb

## 2024-05-01 DIAGNOSIS — F3289 Other specified depressive episodes: Secondary | ICD-10-CM

## 2024-05-01 DIAGNOSIS — N644 Mastodynia: Secondary | ICD-10-CM | POA: Diagnosis not present

## 2024-05-01 DIAGNOSIS — D649 Anemia, unspecified: Secondary | ICD-10-CM | POA: Diagnosis not present

## 2024-05-01 MED ORDER — FLUOXETINE HCL 10 MG PO CAPS
10.0000 mg | ORAL_CAPSULE | Freq: Every day | ORAL | 3 refills | Status: AC
Start: 2024-05-01 — End: ?

## 2024-05-01 NOTE — Progress Notes (Signed)
 Subjective   Patient ID: Kayla Dunn, female    DOB: 11/10/1980, 43 y.o.   MRN: [id number]  Chief Complaint  Patient presents with   Follow-up    Hypertension, umbilical abscess   Depression    Noticed 2 weeks ago,    Breast Pain    Pain located in the nipple area of bilateral breast and radiates underneath the breast   Referral    psychiatry    Referring provider: [name], NP  Kayla Dunn is a 43 y.o. female with Past Medical History: 2023: Anemia     Comment:  Follows w/ Dr. Sherrod st [facility]. s/p              iron  infusions No date: Arthritis     Comment:  back No date: Asthma     Comment:  mild No date: Bronchitis No date: Chronic low back pain     Comment:  Follows w/ ortho, Dr. Kay Cummins. w/ bilateral sciatica 06/28/2019: COVID-19 No date: Depression     Comment:  hx of No date: GERD (gastroesophageal reflux disease)     Comment:  Follows w/ PCP. No date: Heart murmur No date: Heart palpitations     Comment:  Follows w/ PCP. Patient is taking a beta blocker. No date: Hypertension     Comment:  Follows with PCP, Bascom Oley, NP. No date: Insomnia No date: Migraines     Comment:  Follows w/ PCP. 2015: Pancreatitis No date: Perimenopausal 2010: PID (acute pelvic inflammatory disease) No date: Pregnancy induced hypertension No date: Preterm labor No date: PTSD (post-traumatic stress disorder) No date: Radiculopathy of cervical spine     Comment:  Follows w/ ortho, Dr. Kay Cummins. No date: Seasonal allergies No date: Sickle cell trait No date: Wears glasses   HPI  Patient presents today for a follow-up visit.  She states that she has both been elevated pressure.  She has had issues with this in the past.  She would like a referral to psychiatry.  We will also start low-dose Prozac daily.  Patient does need referral back to hematology to follow-up on anemia as well.  Will place referral per patient request.  Patient  states that she is having pain to bilateral nipples and breast.  She was scheduled to have a repeat diagnostic mammogram this January.  We will place a new order to see if she can get this done early.  Patient denies any discharge, rash, redness or warmth to the touch. Denies f/c/s, n/v/d, hemoptysis, PND, leg swelling Denies chest pain or edema     Allergies  Allergen Reactions   Aloe Vera Hives and Itching   Aspirin Anaphylaxis   Hydromorphone  Hcl Anaphylaxis   Morphine  Hives   Shrimp [Shellfish Allergy] Swelling   Dust Mite Extract     Seasonal allergies     Immunization History  Administered Date(s) Administered   Influenza Whole 04/28/2010   Influenza,inj,Quad PF,6+ Mos 03/03/2014, 05/14/2018   Td 07/21/1997   Tdap 04/14/2022    Tobacco History: Social History   Tobacco Use  Smoking Status Light Smoker   Current packs/day: 0.25   Average packs/day: 0.3 packs/day for 12.0 years (3.0 ttl pk-yrs)   Types: Cigarettes  Smokeless Tobacco Never   Ready to quit: Not Answered Counseling given: Not Answered   Outpatient Encounter Medications as of 05/01/2024  Medication Sig   albuterol  (PROVENTIL ) (2.5 MG/3ML) 0.083% nebulizer solution Take 3 mLs (2.5 mg total)  by nebulization every 6 (six) hours as needed for wheezing or shortness of breath.   amLODipine  (NORVASC ) 10 MG tablet TAKE 1 TABLET BY MOUTH EVERY DAY   cyclobenzaprine  (FLEXERIL ) 10 MG tablet Take 1 tablet (10 mg total) by mouth 3 (three) times daily as needed for muscle spasms.   diclofenac  Sodium (VOLTAREN ) 1 % GEL Apply 2 g topically 4 (four) times daily.   FLUoxetine (PROZAC) 10 MG capsule Take 1 capsule (10 mg total) by mouth daily.   fluticasone  (FLONASE ) 50 MCG/ACT nasal spray Place 2 sprays into both nostrils daily.   gabapentin  (NEURONTIN ) 300 MG capsule TAKE 1 CAPSULE BY MOUTH THREE TIMES A DAY   metoprolol  succinate (TOPROL -XL) 25 MG 24 hr tablet Take 1 tablet (25 mg total) by mouth daily.   montelukast   (SINGULAIR ) 10 MG tablet TAKE 1 TABLET BY MOUTH EVERYDAY AT BEDTIME   pantoprazole  (PROTONIX ) 40 MG tablet TAKE 1 TABLET BY MOUTH EVERY DAY   triamcinolone  cream (KENALOG ) 0.1 % Apply 1 Application topically 2 (two) times daily.   mupirocin  ointment (BACTROBAN ) 2 % Apply 1 Application topically 2 (two) times daily. (Patient not taking: Reported on 05/01/2024)   nystatin  ointment (MYCOSTATIN ) Apply 1 application  topically daily as needed (rash). (Patient not taking: Reported on 06/29/2023)   No facility-administered encounter medications on file as of 05/01/2024.    Review of Systems  Review of Systems  Constitutional: Negative.   HENT: Negative.    Cardiovascular: Negative.   Gastrointestinal: Negative.   Allergic/Immunologic: Negative.   Neurological: Negative.      Objective:   BP 129/80 (BP Location: Left Arm, Patient Position: Sitting, Cuff Size: Normal)   Pulse 73   Wt 148 lb 6.4 oz (67.3 kg)   LMP 08/25/2022   SpO2 98%   BMI 27.81 kg/m   Wt Readings from Last 5 Encounters:  05/01/24 148 lb 6.4 oz (67.3 kg)  03/29/24 144 lb (65.3 kg)  12/28/23 144 lb 12.8 oz (65.7 kg)  12/14/23 144 lb 12.8 oz (65.7 kg)  06/29/23 148 lb 12.8 oz (67.5 kg)     Physical Exam Vitals and nursing note reviewed.  Constitutional:      General: She is not in acute distress.    Appearance: She is well-developed.  Cardiovascular:     Rate and Rhythm: Normal rate and regular rhythm.  Pulmonary:     Effort: Pulmonary effort is normal.     Breath sounds: Normal breath sounds.  Neurological:     Mental Status: She is alert and oriented to person, place, and time.       Assessment & Plan:   Breast pain -     MM 3D DIAGNOSTIC MAMMOGRAM BILATERAL BREAST  Other depression -     Ambulatory referral to Psychiatry -     FLUoxetine HCl; Take 1 capsule (10 mg total) by mouth daily.  Dispense: 90 capsule; Refill: 3  Anemia, unspecified type -     Ambulatory referral to Hematology /  Oncology     Return in about 6 months (around 10/29/2024).   Bascom GORMAN Borer, NP 05/01/2024

## 2024-05-01 NOTE — Patient Instructions (Signed)
 Major Depressive Disorder, Adult Major depressive disorder (MDD) is a mental health condition. People with this disorder feel very sad, hopeless, and lose interest in things. Symptoms last most of the day, almost every day, for 2 weeks. MDD can affect: Relationships. Work and school. Things you usually like to do. What are the causes? The cause of MDD is not known. What increases the risk? Having family members with depression. Being female. Family problems. Alcohol or drug misuse. A lot of stress in your life, such as from: Living without basic needs such as food and housing. Being treated poorly because of race, sex, or religion (discrimination). Things that caused you pain as a child, especially if you lost a parent or were abused. Health and mental problems that you have had for a long time. What are the signs or symptoms? The main symptoms of this condition are: Being sad all the time. Being grouchy (irritable) all the time. Not enjoying the things you usually like. Sleeping too much or too little. Eating too much or too little. Feeling tired. Other symptoms include: Gaining or losing weight, without knowing why. Being restless and weak. Feeling hopeless, worthless, or guilty. Trouble thinking or making decisions. Thoughts of hurting yourself or others, or thoughts of ending your life. Spending a lot of time alone. Being unable to do daily tasks. If you have very bad MDD, you may: Believe things that are not true. Hear, see, taste, or feel things that are not there. Have mild depression that lasts for at least 2 years. Feel very sad and hopeless. Have trouble speaking or moving. Feel very sad during some seasons. How is this treated? Talk therapy. This teaches you about thoughts, feelings, and actions and how to change them. This can also help you to talk with others. This can be done with members of your family. Medicines. Lifestyle changes. You may need to: Limit  alcohol use. Stop using drugs, if you use them. Exercise. Get plenty of sleep. Eat healthy. Spend more time outdoors. Brain stimulation. This may be done when symptoms are very bad or have not gotten better. Follow these instructions at home: Alcohol use Do not drink alcohol if: Your health care provider tells you not to drink. You are pregnant, may be pregnant, or are planning to become pregnant. If you drink alcohol: Limit how much you use to: 0-1 drink a day for women. 0-2 drinks a day for men. Know how much alcohol is in your drink. In the [place] one drink equals one 12 oz bottle of beer (355 mL), one 5 oz glass of wine (148 mL), or one 1 oz glass of hard liquor (44 mL). Activity Exercise as told by your doctor. Spend time outdoors. Make time to do the things you enjoy. Find ways to deal with stress. Try to: Meditate. Do deep breathing. Spend time in nature. Keep a journal. Return to your normal activities when your doctor says that it is safe. General instructions  Take over-the-counter and prescription medicines only as told by your doctor. Talk to your doctor about: Alcohol use. It can affect medicines. Any drug use. Eat healthy foods. Get a lot of sleep. Think about joining a support group. Ask your doctor about that. Keep all follow-up visits. Your doctor will need to check on your mood, behavior, and medicines, and change your treatment as needed. Where to find more information: [employer] on Mental Illness: [employer]ami[employer] of Mental Health: BloggerCourse.com American Psychiatric Association: psychiatry.org Contact a doctor  if: You feel worse. You get new symptoms. Get help right away if: You hurt yourself on purpose (self-harm). You have thoughts about hurting yourself or others. You see, hear, taste, smell, or feel things that are not there. Get help right away if you feel like you may hurt yourself or others, or have thoughts about taking  your own life. Go to your nearest emergency room or: Call 911. Call the National Suicide Prevention Lifeline at (513) 004-4926 or 988. This is open 24 hours a day. Text the Crisis Text Line at (314) 815-6587. This information is not intended to replace advice given to you by your health care provider. Make sure you discuss any questions you have with your health care provider. Document Revised: 10/12/2021 Document Reviewed: 10/12/2021 Elsevier Patient Education  2024 [employer].

## 2024-05-10 ENCOUNTER — Other Ambulatory Visit: Payer: Self-pay

## 2024-05-10 DIAGNOSIS — D509 Iron deficiency anemia, unspecified: Secondary | ICD-10-CM

## 2024-05-13 ENCOUNTER — Inpatient Hospital Stay

## 2024-05-13 ENCOUNTER — Inpatient Hospital Stay: Admitting: Internal Medicine

## 2024-05-20 ENCOUNTER — Telehealth: Payer: Self-pay | Admitting: Physician Assistant

## 2024-05-20 ENCOUNTER — Inpatient Hospital Stay: Attending: Nurse Practitioner

## 2024-05-20 ENCOUNTER — Inpatient Hospital Stay: Admitting: Internal Medicine

## 2024-05-20 NOTE — Telephone Encounter (Signed)
 Rescheduled patients missed appointments for this Friday 12/5. Called and spoke with the patient about the new day and time, she is aware.

## 2024-05-21 NOTE — Progress Notes (Deleted)
 [facility] OFFICE PROGRESS NOTE  [name], NP [phone] [address] [place] [address]  DIAGNOSIS: Iron  deficiency anemia secondary to menorrhagia with intolerance to the oral iron  tablets.   PRIOR THERAPY: None  CURRENT THERAPY: Iron  infusion with Venofer  300 Mg IV weekly for 3 weeks. Most recent dose on 04/25/22.   INTERVAL HISTORY: Kayla Dunn 43 y.o. female returns to clinic for a follow-up visit. The patient is followed by the clinic for history of iron  deficiency anemia secondary to menorrhagia.   She was lost to follow up since 10/05/23. She underwent a hysterectomy on 09/06/22.  She tolerated the procedure well. Regarding her anemia, she has intolerance to oral iron  supplements that she receives to IV iron  with Venofer  as needed.  Her most recent dose was on 04/25/2022.    Her PCP referred her back to us  for er anemia. It looks like her Hbg has normalized since she had her hysterectomy. Her Hbg over the last year and a half has been normal. Since last being seen she did not denies any worsening symptoms of anemia.  She has good energy.  Denies any chest pain, shortness of breath, cough, or hemoptysis.  Denies any other abnormal bleeding or bruising.  She is here today for evaluation and repeat blood work.   ***She saw PCP who referred her to psychiatry and started her on low dose Prozac .   MEDICAL HISTORY: Past Medical History:  Diagnosis Date   Anemia 2023   Follows w/ Dr. Sherrod st [facility]. s/p iron  infusions   Arthritis    back   Asthma    mild   Bronchitis    Chronic low back pain    Follows w/ ortho, Dr. Kay Cummins. w/ bilateral sciatica   COVID-19 06/28/2019   Depression    hx of   GERD (gastroesophageal reflux disease)    Follows w/ PCP.   Heart murmur    Heart palpitations    Follows w/ PCP. Patient is taking a beta blocker.   Hypertension    Follows with PCP, Bascom Oley, NP.   Insomnia    Migraines     Follows w/ PCP.   Pancreatitis 2015   Perimenopausal    PID (acute pelvic inflammatory disease) 2010   Pregnancy induced hypertension    Preterm labor    PTSD (post-traumatic stress disorder)    Radiculopathy of cervical spine    Follows w/ ortho, Dr. Kay Cummins.   Seasonal allergies    Sickle cell trait    Wears glasses     ALLERGIES:  is allergic to aloe vera, aspirin, hydromorphone  hcl, morphine , shrimp [shellfish allergy], and dust mite extract.  MEDICATIONS:  Current Outpatient Medications  Medication Sig Dispense Refill   albuterol  (PROVENTIL ) (2.5 MG/3ML) 0.083% nebulizer solution Take 3 mLs (2.5 mg total) by nebulization every 6 (six) hours as needed for wheezing or shortness of breath. 75 mL 1   amLODipine  (NORVASC ) 10 MG tablet TAKE 1 TABLET BY MOUTH EVERY DAY 90 tablet 0   cyclobenzaprine  (FLEXERIL ) 10 MG tablet Take 1 tablet (10 mg total) by mouth 3 (three) times daily as needed for muscle spasms. 30 tablet 0   diclofenac  Sodium (VOLTAREN ) 1 % GEL Apply 2 g topically 4 (four) times daily. 100 g 2   FLUoxetine  (PROZAC ) 10 MG capsule Take 1 capsule (10 mg total) by mouth daily. 90 capsule 3   fluticasone  (FLONASE ) 50 MCG/ACT nasal spray Place  2 sprays into both nostrils daily. 16 g 6   gabapentin  (NEURONTIN ) 300 MG capsule TAKE 1 CAPSULE BY MOUTH THREE TIMES A DAY 90 capsule 0   metoprolol  succinate (TOPROL -XL) 25 MG 24 hr tablet Take 1 tablet (25 mg total) by mouth daily. 90 tablet 3   montelukast  (SINGULAIR ) 10 MG tablet TAKE 1 TABLET BY MOUTH EVERYDAY AT BEDTIME 90 tablet 1   mupirocin  ointment (BACTROBAN ) 2 % Apply 1 Application topically 2 (two) times daily. (Patient not taking: Reported on 05/01/2024) 22 g 0   nystatin  ointment (MYCOSTATIN ) Apply 1 application  topically daily as needed (rash). (Patient not taking: Reported on 06/29/2023)     pantoprazole  (PROTONIX ) 40 MG tablet TAKE 1 TABLET BY MOUTH EVERY DAY 90 tablet 1   triamcinolone  cream (KENALOG ) 0.1 % Apply 1  Application topically 2 (two) times daily. 30 g 0   No current facility-administered medications for this visit.    SURGICAL HISTORY:  Past Surgical History:  Procedure Laterality Date   ANTERIOR CERVICAL DECOMP/DISCECTOMY FUSION N/A 10/25/2021   Procedure: Anterior Cervical Decompression Fusion - Cervical five-Cervical six - Cervical six-Cervical seven - Cervical seven-Thoracic one;  Surgeon: Louis Shove, MD;  Location: [facility] OR;  Service: Neurosurgery;  Laterality: N/A;   BREAST BIOPSY Left 01/26/2023   US  LT BREAST BX W LOC DEV 1ST LESION IMG BX SPEC US  GUIDE 01/26/2023 [facility]   BREAST BIOPSY Left 01/26/2023   MM LT BREAST BX W LOC DEV 1ST LESION IMAGE BX SPEC STEREO GUIDE 01/26/2023 [facility]   CESAREAN SECTION     x 2   CHOLECYSTECTOMY N/A 08/27/2013   Procedure: LAPAROSCOPIC CHOLECYSTECTOMY WITH INTRAOPERATIVE CHOLANGIOGRAM;  Surgeon: Donnice KATHEE Lunger, MD;  Location: [facility];  Service: General;  Laterality: N/A;   CYSTOSCOPY N/A 09/06/2022   Procedure: PHYLLIS;  Surgeon: Cleotilde Ronal RAMAN, MD;  [facility];  Service: Gynecology;  Laterality: N/A;   DILATION AND CURETTAGE OF UTERUS     2010 and around 2018   OVARY SURGERY  2004   2004   TOTAL LAPAROSCOPIC HYSTERECTOMY WITH SALPINGECTOMY Bilateral 09/06/2022   Procedure: TOTAL LAPAROSCOPIC HYSTERECTOMY WITH SALPINGECTOMY AND POSSIBLE OOPHORECTOMY;  Surgeon: Cleotilde Ronal RAMAN, MD;  Location: [facility];  Service: Gynecology;  Laterality: Bilateral;   TUBAL LIGATION  2012    REVIEW OF SYSTEMS:   Review of Systems  Constitutional: Negative for appetite change, chills, fatigue, fever and unexpected weight change.  HENT:   Negative for mouth sores, nosebleeds, sore throat and trouble swallowing.   Eyes: Negative for eye problems and icterus.  Respiratory: Negative for cough, hemoptysis, shortness of breath and wheezing.   Cardiovascular: Negative for chest pain and leg swelling.   Gastrointestinal: Negative for abdominal pain, constipation, diarrhea, nausea and vomiting.  Genitourinary: Negative for bladder incontinence, difficulty urinating, dysuria, frequency and hematuria.   Musculoskeletal: Negative for back pain, gait problem, neck pain and neck stiffness.  Skin: Negative for itching and rash.  Neurological: Negative for dizziness, extremity weakness, gait problem, headaches, light-headedness and seizures.  Hematological: Negative for adenopathy. Does not bruise/bleed easily.  Psychiatric/Behavioral: Negative for confusion, depression and sleep disturbance. The patient is not nervous/anxious.     PHYSICAL EXAMINATION:  Last menstrual period 08/25/2022.  ECOG PERFORMANCE STATUS: {CHL ONC ECOG [id number]  Physical Exam  Constitutional: Oriented to person, place, and time and well-developed, well-nourished, and in no distress. No distress.  HENT:  Head: Normocephalic and atraumatic.  Mouth/Throat: Oropharynx is clear and moist.  No oropharyngeal exudate.  Eyes: Conjunctivae are normal. Right eye exhibits no discharge. Left eye exhibits no discharge. No scleral icterus.  Neck: Normal range of motion. Neck supple.  Cardiovascular: Normal rate, regular rhythm, normal heart sounds and intact distal pulses.   Pulmonary/Chest: Effort normal and breath sounds normal. No respiratory distress. No wheezes. No rales.  Abdominal: Soft. Bowel sounds are normal. Exhibits no distension and no mass. There is no tenderness.  Musculoskeletal: Normal range of motion. Exhibits no edema.  Lymphadenopathy:    No cervical adenopathy.  Neurological: Alert and oriented to person, place, and time. Exhibits normal muscle tone. Gait normal. Coordination normal.  Skin: Skin is warm and dry. No rash noted. Not diaphoretic. No erythema. No pallor.  Psychiatric: Mood, memory and judgment normal.  Vitals reviewed.  LABORATORY DATA: Lab Results  Component Value Date   WBC 6.3  03/29/2024   HGB 13.0 03/29/2024   HCT 40.5 03/29/2024   MCV 95 03/29/2024   PLT 421 03/29/2024      Chemistry      Component Value Date/Time   NA 139 12/14/2023 1054   K 4.2 12/14/2023 1054   CL 102 12/14/2023 1054   CO2 20 12/14/2023 1054   BUN 10 12/14/2023 1054   CREATININE 0.74 12/14/2023 1054   CREATININE 0.70 03/31/2022 1321   CREATININE 0.66 03/03/2014 1105      Component Value Date/Time   CALCIUM 9.2 12/14/2023 1054   ALKPHOS 102 12/14/2023 1054   AST 19 12/14/2023 1054   AST 26 03/31/2022 1321   ALT 10 12/14/2023 1054   ALT 14 03/31/2022 1321   BILITOT 0.3 12/14/2023 1054   BILITOT 0.6 03/31/2022 1321       RADIOGRAPHIC STUDIES:  No results found.   ASSESSMENT/PLAN:  This is a very pleasant 43 year old African-American female with iron  deficiency anemia secondary to menstrual blood loss and inadequate oral iron  supplements due to intolerance.   She receives IV iron  with Venofer  300 mg as needed.  Her most recent dose was in November 2023.   She recently underwent hysterectomy on 09/06/2022.  She tolerated this well.   The patient had repeat CBC, iron  studies, ferritin drawn today.  Her labs show normal CBC.  Her iron  studies are pending at this time.  Should she continue to show significant iron  deficiency on labs, we will arrange for IV iron .  Otherwise, we recommend she continue on observation.   I will arrange follow up in 6 months based on her pending iron  studies. Someone from scheduling will be in touch with her later this week to schedule follow up.   If her Hbg is stable, she does not need to follow with us  on a regular basis. ***  The patient was advised to call immediately if she has any concerning symptoms in the interval. The patient voices understanding of current disease status and treatment options and is in agreement with the current care plan. All questions were answered. The patient knows to call the clinic with any problems, questions or  concerns. We can certainly see the patient much sooner if necessary    No orders of the defined types were placed in this encounter.    I spent {CHL ONC TIME VISIT - DTPQU:[id number]} counseling the patient face to face. The total time spent in the appointment was {CHL ONC TIME VISIT - DTPQU:[id number]}.  Maudy Yonan L Saranda Legrande, PA-C 05/21/24

## 2024-05-24 ENCOUNTER — Inpatient Hospital Stay: Admitting: Physician Assistant

## 2024-05-24 ENCOUNTER — Inpatient Hospital Stay

## 2024-05-28 ENCOUNTER — Encounter

## 2024-05-28 ENCOUNTER — Other Ambulatory Visit

## 2024-06-14 NOTE — Progress Notes (Signed)
 [facility] OFFICE PROGRESS NOTE  Kayla Bascom RAMAN, NP [phone] [address]. [address] [place] [address]  DIAGNOSIS: Iron  deficiency anemia secondary to menorrhagia with intolerance to the oral iron  tablets.   PRIOR THERAPY: None  CURRENT THERAPY: Iron  infusion with Venofer  300 Mg IV weekly for 3 weeks. Most recent dose on 04/25/22.   INTERVAL HISTORY: Kayla Dunn 43 y.o. female returns to the  clinic for a follow-up visit. The patient is followed by the clinic for history of iron  deficiency anemia secondary to menorrhagia.   She was lost to follow up since 10/05/22. She underwent a hysterectomy on 09/06/22.  She tolerated the procedure well. Regarding her anemia, she has intolerance to oral iron  supplements that she receives to IV iron  with Venofer  as needed.  Her most recent dose was on 04/25/2022.    Her PCP referred her back to us  for er anemia. It looks like her Hbg has normalized since she had her hysterectomy. Her Hbg over the last year and a half has been normal.   She reports persistent fatigue and low energy, recently sleeping for a day and a half without improvement. She denies abnormal bleeding, including epistaxis, gingival bleeding, hematuria, melena, or hematemesis. She also denies increased frequency of infections, shortness of breath, cough, hemoptysis, or abnormal bruising. There have been no recent medication changes, and her thyroid  function has been evaluated by her family physician. She does eat an iron  rich diet and likes to eat liver.   She has a diagnosis of hypertension with variable control and did not take her antihypertensive medication on the morning of the visit.   She is here today for evaluation and repeat blood work.    MEDICAL HISTORY: Past Medical History:  Diagnosis Date   Anemia 2023   Follows w/ Dr. Sherrod [facility]. s/p iron  infusions   Arthritis    back   Asthma    mild   Bronchitis    Chronic low back pain     Follows w/ ortho, Dr. Kay Cummins. w/ bilateral sciatica   COVID-19 06/28/2019   Depression    hx of   GERD (gastroesophageal reflux disease)    Follows w/ PCP.   Heart murmur    Heart palpitations    Follows w/ PCP. Patient is taking a beta blocker.   Hypertension    Follows with PCP, Bascom Oley, NP.   Insomnia    Migraines    Follows w/ PCP.   Pancreatitis 2015   Perimenopausal    PID (acute pelvic inflammatory disease) 2010   Pregnancy induced hypertension    Preterm labor    PTSD (post-traumatic stress disorder)    Radiculopathy of cervical spine    Follows w/ ortho, Dr. Kay Cummins.   Seasonal allergies    Sickle cell trait    Wears glasses     ALLERGIES:  is allergic to aloe vera, aspirin, hydromorphone  hcl, morphine , shrimp [shellfish allergy], and dust mite extract.  MEDICATIONS:  Current Outpatient Medications  Medication Sig Dispense Refill   albuterol  (PROVENTIL ) (2.5 MG/3ML) 0.083% nebulizer solution Take 3 mLs (2.5 mg total) by nebulization every 6 (six) hours as needed for wheezing or shortness of breath. 75 mL 1   amLODipine  (NORVASC ) 10 MG tablet TAKE 1 TABLET BY MOUTH EVERY DAY 90 tablet 0   cyclobenzaprine  (FLEXERIL ) 10 MG tablet Take 1 tablet (10 mg total) by mouth 3 (three) times daily as needed for muscle spasms.  30 tablet 0   diclofenac  Sodium (VOLTAREN ) 1 % GEL Apply 2 g topically 4 (four) times daily. 100 g 2   FLUoxetine  (PROZAC ) 10 MG capsule Take 1 capsule (10 mg total) by mouth daily. 90 capsule 3   fluticasone  (FLONASE ) 50 MCG/ACT nasal spray Place 2 sprays into both nostrils daily. 16 g 6   gabapentin  (NEURONTIN ) 300 MG capsule TAKE 1 CAPSULE BY MOUTH THREE TIMES A DAY 90 capsule 0   metoprolol  succinate (TOPROL -XL) 25 MG 24 hr tablet Take 1 tablet (25 mg total) by mouth daily. 90 tablet 3   montelukast  (SINGULAIR ) 10 MG tablet TAKE 1 TABLET BY MOUTH EVERYDAY AT BEDTIME 90 tablet 1   mupirocin  ointment (BACTROBAN ) 2 % Apply 1 Application  topically 2 (two) times daily. (Patient not taking: Reported on 05/01/2024) 22 g 0   nystatin  ointment (MYCOSTATIN ) Apply 1 application  topically daily as needed (rash). (Patient not taking: Reported on 06/29/2023)     pantoprazole  (PROTONIX ) 40 MG tablet TAKE 1 TABLET BY MOUTH EVERY DAY 90 tablet 1   triamcinolone  cream (KENALOG ) 0.1 % Apply 1 Application topically 2 (two) times daily. 30 g 0   No current facility-administered medications for this visit.    SURGICAL HISTORY:  Past Surgical History:  Procedure Laterality Date   ANTERIOR CERVICAL DECOMP/DISCECTOMY FUSION N/A 10/25/2021   Procedure: Anterior Cervical Decompression Fusion - Cervical five-Cervical six - Cervical six-Cervical seven - Cervical seven-Thoracic one;  Surgeon: Louis Shove, MD;  Location: [facility] OR;  Service: Neurosurgery;  Laterality: N/A;   BREAST BIOPSY Left 01/26/2023   US  LT BREAST BX W LOC DEV 1ST LESION IMG BX SPEC US  GUIDE 01/26/2023 [facility]   BREAST BIOPSY Left 01/26/2023   MM LT BREAST BX W LOC DEV 1ST LESION IMAGE BX SPEC STEREO GUIDE 01/26/2023 [facility]   CESAREAN SECTION     x 2   CHOLECYSTECTOMY N/A 08/27/2013   Procedure: LAPAROSCOPIC CHOLECYSTECTOMY WITH INTRAOPERATIVE CHOLANGIOGRAM;  Surgeon: Donnice KATHEE Lunger, MD;  Location: [facility];  Service: General;  Laterality: N/A;   CYSTOSCOPY N/A 09/06/2022   Procedure: PHYLLIS;  Surgeon: Cleotilde Ronal RAMAN, MD;  Location: [facility];  Service: Gynecology;  Laterality: N/A;   DILATION AND CURETTAGE OF UTERUS     2010 and around 2018   OVARY SURGERY  2004   2004   TOTAL LAPAROSCOPIC HYSTERECTOMY WITH SALPINGECTOMY Bilateral 09/06/2022   Procedure: TOTAL LAPAROSCOPIC HYSTERECTOMY WITH SALPINGECTOMY AND POSSIBLE OOPHORECTOMY;  Surgeon: Cleotilde Ronal RAMAN, MD;  Location: [facility] ;  Service: Gynecology;  Laterality: Bilateral;   TUBAL LIGATION  2012    REVIEW OF SYSTEMS:   Review of Systems  Constitutional: Positive for  fatigue. Negative for appetite change, chills, fever and unexpected weight change.  HENT:   Negative for mouth sores, nosebleeds, sore throat and trouble swallowing.   Eyes: Negative for eye problems and icterus.  Respiratory: Negative for cough, hemoptysis, shortness of breath and wheezing.   Cardiovascular: Negative for chest pain and leg swelling.  Gastrointestinal: Negative for abdominal pain, constipation, diarrhea, nausea and vomiting.  Genitourinary: Negative for bladder incontinence, difficulty urinating, dysuria, frequency and hematuria.   Musculoskeletal: Negative for back pain, gait problem, neck pain and neck stiffness.  Skin: Negative for itching and rash.  Neurological: Negative for dizziness, extremity weakness, gait problem, headaches, light-headedness and seizures.  Hematological: Negative for adenopathy. Does not bruise/bleed easily.  Psychiatric/Behavioral: Negative for confusion, depression and sleep disturbance. The patient is not nervous/anxious.  PHYSICAL EXAMINATION:  Blood pressure (!) 166/106, pulse 78, temperature 98.3 F (36.8 C), temperature source Temporal, resp. rate 17, height 5' (1.524 m), weight 144 lb 3.2 oz (65.4 kg), last menstrual period 08/25/2022, SpO2 96%.  ECOG PERFORMANCE STATUS: 1  Physical Exam  Constitutional: Oriented to person, place, and time and well-developed, well-nourished, and in no distress.  HENT:  Head: Normocephalic and atraumatic.  Mouth/Throat: Oropharynx is clear and moist. No oropharyngeal exudate.  Eyes: Conjunctivae are normal. Right eye exhibits no discharge. Left eye exhibits no discharge. No scleral icterus.  Neck: Normal range of motion. Neck supple.  Cardiovascular: Normal rate, regular rhythm, normal heart sounds and intact distal pulses.   Pulmonary/Chest: Effort normal and breath sounds normal. No respiratory distress. No wheezes. No rales.  Abdominal: Soft. Bowel sounds are normal. Exhibits no distension and no  mass. There is no tenderness.  Musculoskeletal: Normal range of motion. Exhibits no edema.  Lymphadenopathy:    No cervical adenopathy.  Neurological: Alert and oriented to person, place, and time. Exhibits normal muscle tone. Gait normal. Coordination normal.  Skin: Skin is warm and dry. No rash noted. Not diaphoretic. No erythema. No pallor.  Psychiatric: Mood, memory and judgment normal.  Vitals reviewed.  LABORATORY DATA: Lab Results  Component Value Date   WBC 7.8 06/21/2024   HGB 13.1 06/21/2024   HCT 38.5 06/21/2024   MCV 88.7 06/21/2024   PLT 379 06/21/2024      Chemistry      Component Value Date/Time   NA 139 12/14/2023 1054   K 4.2 12/14/2023 1054   CL 102 12/14/2023 1054   CO2 20 12/14/2023 1054   BUN 10 12/14/2023 1054   CREATININE 0.74 12/14/2023 1054   CREATININE 0.70 03/31/2022 1321   CREATININE 0.66 03/03/2014 1105      Component Value Date/Time   CALCIUM 9.2 12/14/2023 1054   ALKPHOS 102 12/14/2023 1054   AST 19 12/14/2023 1054   AST 26 03/31/2022 1321   ALT 10 12/14/2023 1054   ALT 14 03/31/2022 1321   BILITOT 0.3 12/14/2023 1054   BILITOT 0.6 03/31/2022 1321       RADIOGRAPHIC STUDIES:  No results found.   ASSESSMENT/PLAN:  This is a very pleasant 43 year old African-American female with iron  deficiency anemia secondary to menstrual blood loss and inadequate oral iron  supplements due to intolerance.   She receives IV iron  with Venofer  300 mg as needed.  Her most recent dose was in November 2023.   She recently underwent hysterectomy on 09/06/2022.  She tolerated this well.She has not needed IV iron  in 2 years.   The patient had repeat CBC, iron  studies, ferritin drawn today.  Her labs show normal CBC.  Her iron  studies are pending at this time.  Should she continue to show significant iron  deficiency on labs, we will arrange for IV iron . I do not think she will require this though. Otherwise, we recommend she continue on observation.    Recommended annual hematology follow-up unless laboratory values decline, warranting earlier evaluation.   Encouraged increased dietary iron  intake through red meat, liver, and green leafy vegetables.  Asymptomatic from her hypertension. Emphasized medication adherence to prevent complications. Instructed to take antihypertensive medication upon returning home and check BP at home for self monitoring.  Advised follow-up with primary care provider if blood pressure remains elevated despite adherence.  The patient was advised to call immediately if she has any concerning symptoms in the interval. The patient voices understanding of current disease  status and treatment options and is in agreement with the current care plan. All questions were answered. The patient knows to call the clinic with any problems, questions or concerns. We can certainly see the patient much sooner if necessary    Orders Placed This Encounter  Procedures   CBC with Differential (Cancer Center Only)    Standing Status:   Future    Expiration Date:   06/21/2025   Ferritin    Standing Status:   Future    Expected Date:   06/16/2025    Expiration Date:   09/14/2025   Iron  and Iron  Binding Capacity ([facility] only)    Standing Status:   Future    Expected Date:   06/16/2025    Expiration Date:   09/14/2025     The total time spent in the appointment was 20-29 minutes  Shabnam Ladd L Arriyah Madej, PA-C 06/21/2024

## 2024-06-21 ENCOUNTER — Inpatient Hospital Stay (HOSPITAL_BASED_OUTPATIENT_CLINIC_OR_DEPARTMENT_OTHER): Admitting: Physician Assistant

## 2024-06-21 ENCOUNTER — Other Ambulatory Visit (HOSPITAL_COMMUNITY)

## 2024-06-21 ENCOUNTER — Inpatient Hospital Stay: Attending: Physician Assistant

## 2024-06-21 VITALS — BP 166/106 | HR 78 | Temp 98.3°F | Resp 17 | Ht 60.0 in | Wt 144.2 lb

## 2024-06-21 DIAGNOSIS — Z862 Personal history of diseases of the blood and blood-forming organs and certain disorders involving the immune mechanism: Secondary | ICD-10-CM | POA: Diagnosis not present

## 2024-06-21 DIAGNOSIS — D509 Iron deficiency anemia, unspecified: Secondary | ICD-10-CM | POA: Diagnosis not present

## 2024-06-21 LAB — CBC WITH DIFFERENTIAL (CANCER CENTER ONLY)
Abs Immature Granulocytes: 0.01 K/uL (ref 0.00–0.07)
Basophils Absolute: 0.1 K/uL (ref 0.0–0.1)
Basophils Relative: 1 %
Eosinophils Absolute: 0.1 K/uL (ref 0.0–0.5)
Eosinophils Relative: 1 %
HCT: 38.5 % (ref 36.0–46.0)
Hemoglobin: 13.1 g/dL (ref 12.0–15.0)
Immature Granulocytes: 0 %
Lymphocytes Relative: 38 %
Lymphs Abs: 3 K/uL (ref 0.7–4.0)
MCH: 30.2 pg (ref 26.0–34.0)
MCHC: 34 g/dL (ref 30.0–36.0)
MCV: 88.7 fL (ref 80.0–100.0)
Monocytes Absolute: 0.7 K/uL (ref 0.1–1.0)
Monocytes Relative: 9 %
Neutro Abs: 3.9 K/uL (ref 1.7–7.7)
Neutrophils Relative %: 51 %
Platelet Count: 379 K/uL (ref 150–400)
RBC: 4.34 MIL/uL (ref 3.87–5.11)
RDW: 14.5 % (ref 11.5–15.5)
WBC Count: 7.8 K/uL (ref 4.0–10.5)
nRBC: 0 % (ref 0.0–0.2)

## 2024-06-21 LAB — IRON AND IRON BINDING CAPACITY (CC-WL,HP ONLY)
Iron: 153 ug/dL (ref 28–170)
Saturation Ratios: 32 % — ABNORMAL HIGH (ref 10.4–31.8)
TIBC: 477 ug/dL — ABNORMAL HIGH (ref 250–450)
UIBC: 324 ug/dL

## 2024-06-21 LAB — FERRITIN: Ferritin: 59 ng/mL (ref 11–307)

## 2024-06-24 ENCOUNTER — Telehealth: Payer: Self-pay | Admitting: Internal Medicine

## 2024-06-24 ENCOUNTER — Encounter: Payer: Self-pay | Admitting: Physician Assistant

## 2024-06-24 ENCOUNTER — Other Ambulatory Visit: Payer: Self-pay | Admitting: Nurse Practitioner

## 2024-06-24 DIAGNOSIS — I10 Essential (primary) hypertension: Secondary | ICD-10-CM

## 2024-06-24 NOTE — Telephone Encounter (Signed)
 Called pt and they are aware of the appt scheduled a year from now

## 2024-06-24 NOTE — Telephone Encounter (Signed)
 amLODipine  (NORVASC ) 10 MG tablet [Pharmacy Med Name: AMLODIPINE  BESYLATE 10 MG TAB]

## 2024-06-25 ENCOUNTER — Other Ambulatory Visit: Payer: Self-pay | Admitting: Nurse Practitioner

## 2024-06-25 DIAGNOSIS — Z006 Encounter for examination for normal comparison and control in clinical research program: Secondary | ICD-10-CM

## 2024-06-25 DIAGNOSIS — R232 Flushing: Secondary | ICD-10-CM

## 2024-06-25 DIAGNOSIS — M545 Low back pain, unspecified: Secondary | ICD-10-CM

## 2024-06-25 NOTE — Telephone Encounter (Unsigned)
 Copied from CRM [id number]. Topic: Clinical - Medication Refill >> Jun 25, 2024  3:19 PM Selinda RAMAN wrote: Medication: gabapentin  (NEURONTIN ) 300 MG capsule, diclofenac  Sodium (VOLTAREN ) 1 % GEL, cyclobenzaprine  (FLEXERIL ) 10 MG tablet  Has the patient contacted their pharmacy? Yes   This is the patient's preferred pharmacy:  [id number] [place], [place] - [address] [facility] [address] [place] [address] Phone: [phone] Fax: [phone]  Is this the correct pharmacy for this prescription? Yes If no, delete pharmacy and type the correct one.   Has the prescription been filled recently? No  Is the patient out of the medication? Yes just her gabapentin   Has the patient been seen for an appointment in the last year OR does the patient have an upcoming appointment? Yes  Can we respond through MyChart? Yes  Please assist patient further

## 2024-06-25 NOTE — Telephone Encounter (Signed)
 Please Advise.  CB.

## 2024-06-26 MED ORDER — DICLOFENAC SODIUM 1 % EX GEL: 2.0000 g | Freq: Four times a day (QID) | CUTANEOUS | 2 refills | Status: AC

## 2024-06-26 MED ORDER — CYCLOBENZAPRINE HCL 10 MG PO TABS: 10.0000 mg | ORAL_TABLET | Freq: Three times a day (TID) | ORAL | 0 refills | Status: AC | PRN

## 2024-06-26 MED ORDER — GABAPENTIN 300 MG PO CAPS: 300.0000 mg | ORAL_CAPSULE | Freq: Three times a day (TID) | ORAL | 0 refills | Status: DC

## 2024-06-28 ENCOUNTER — Ambulatory Visit

## 2024-06-28 ENCOUNTER — Ambulatory Visit
Admission: RE | Admit: 2024-06-28 | Discharge: 2024-06-28 | Disposition: A | Source: Ambulatory Visit | Attending: Nurse Practitioner

## 2024-06-28 DIAGNOSIS — N644 Mastodynia: Secondary | ICD-10-CM

## 2024-07-24 ENCOUNTER — Other Ambulatory Visit: Payer: Self-pay | Admitting: Nurse Practitioner

## 2024-07-24 DIAGNOSIS — R232 Flushing: Secondary | ICD-10-CM

## 2024-07-24 NOTE — Telephone Encounter (Signed)
 Please Advise.  CB.

## 2024-07-24 NOTE — Telephone Encounter (Signed)
 gabapentin  (NEURONTIN ) 300 MG capsule [Pharmacy Med Name: GABAPENTIN  300 MG CAPSULE]

## 2024-10-30 ENCOUNTER — Ambulatory Visit: Payer: Self-pay | Admitting: Nurse Practitioner

## 2025-06-23 ENCOUNTER — Inpatient Hospital Stay

## 2025-06-23 ENCOUNTER — Inpatient Hospital Stay: Admitting: Internal Medicine
